# Patient Record
Sex: Male | Born: 1975 | Race: Black or African American | Hispanic: No | Marital: Married | Smoking: Never smoker
Health system: Southern US, Community
[De-identification: ages and names within clinical notes are randomized; demographics above are authoritative.]

## PROBLEM LIST (undated history)

## (undated) DIAGNOSIS — Z940 Kidney transplant status: Secondary | ICD-10-CM

## (undated) DIAGNOSIS — B351 Tinea unguium: Secondary | ICD-10-CM

## (undated) DIAGNOSIS — R000 Tachycardia, unspecified: Secondary | ICD-10-CM

## (undated) DIAGNOSIS — N186 End stage renal disease: Secondary | ICD-10-CM

## (undated) DIAGNOSIS — I509 Heart failure, unspecified: Secondary | ICD-10-CM

## (undated) DIAGNOSIS — I10 Essential (primary) hypertension: Secondary | ICD-10-CM

## (undated) DIAGNOSIS — E785 Hyperlipidemia, unspecified: Secondary | ICD-10-CM

## (undated) DIAGNOSIS — Z87448 Personal history of other diseases of urinary system: Secondary | ICD-10-CM

## (undated) DIAGNOSIS — N289 Disorder of kidney and ureter, unspecified: Secondary | ICD-10-CM

## (undated) HISTORY — PX: KIDNEY TRANSPLANT: SHX239

## (undated) HISTORY — DX: Tinea unguium: B35.1

## (undated) HISTORY — DX: Essential (primary) hypertension: I10

## (undated) HISTORY — DX: Hyperlipidemia, unspecified: E78.5

## (undated) HISTORY — DX: Personal history of other diseases of urinary system: Z87.448

## (undated) HISTORY — DX: Heart failure, unspecified: I50.9

## (undated) HISTORY — DX: Tachycardia, unspecified: R00.0

## (undated) HISTORY — DX: Kidney transplant status: Z94.0

## (undated) HISTORY — PX: ANKLE SURGERY: SHX546

---

## 1995-08-29 DIAGNOSIS — Z87448 Personal history of other diseases of urinary system: Secondary | ICD-10-CM

## 1995-08-29 HISTORY — PX: AV FISTULA PLACEMENT: SHX1204

## 1995-08-29 HISTORY — DX: Personal history of other diseases of urinary system: Z87.448

## 1997-11-26 ENCOUNTER — Other Ambulatory Visit: Admission: RE | Admit: 1997-11-26 | Discharge: 1997-11-26 | Payer: Self-pay | Admitting: Nephrology

## 1997-11-28 ENCOUNTER — Emergency Department (HOSPITAL_COMMUNITY): Admission: EM | Admit: 1997-11-28 | Discharge: 1997-11-28 | Payer: Self-pay | Admitting: Emergency Medicine

## 1997-12-03 ENCOUNTER — Other Ambulatory Visit: Admission: RE | Admit: 1997-12-03 | Discharge: 1997-12-03 | Payer: Self-pay | Admitting: *Deleted

## 1997-12-12 ENCOUNTER — Other Ambulatory Visit: Admission: RE | Admit: 1997-12-12 | Discharge: 1997-12-12 | Payer: Self-pay | Admitting: *Deleted

## 1997-12-17 ENCOUNTER — Other Ambulatory Visit: Admission: RE | Admit: 1997-12-17 | Discharge: 1997-12-17 | Payer: Self-pay | Admitting: Nephrology

## 1998-10-09 ENCOUNTER — Ambulatory Visit (HOSPITAL_COMMUNITY): Admission: RE | Admit: 1998-10-09 | Discharge: 1998-10-09 | Payer: Self-pay | Admitting: Nephrology

## 1998-10-09 ENCOUNTER — Encounter: Payer: Self-pay | Admitting: Nephrology

## 1999-08-30 ENCOUNTER — Inpatient Hospital Stay (HOSPITAL_COMMUNITY): Admission: EM | Admit: 1999-08-30 | Discharge: 1999-09-03 | Payer: Self-pay | Admitting: Emergency Medicine

## 1999-08-30 ENCOUNTER — Encounter: Payer: Self-pay | Admitting: Nephrology

## 1999-08-31 ENCOUNTER — Encounter: Payer: Self-pay | Admitting: Nephrology

## 1999-09-01 ENCOUNTER — Encounter: Payer: Self-pay | Admitting: Nephrology

## 1999-09-08 ENCOUNTER — Encounter: Admission: RE | Admit: 1999-09-08 | Discharge: 1999-09-08 | Payer: Self-pay | Admitting: *Deleted

## 2000-03-15 ENCOUNTER — Inpatient Hospital Stay (HOSPITAL_COMMUNITY): Admission: EM | Admit: 2000-03-15 | Discharge: 2000-03-21 | Payer: Self-pay | Admitting: Emergency Medicine

## 2000-03-16 ENCOUNTER — Encounter: Payer: Self-pay | Admitting: Nephrology

## 2000-04-17 ENCOUNTER — Inpatient Hospital Stay (HOSPITAL_COMMUNITY): Admission: EM | Admit: 2000-04-17 | Discharge: 2000-04-21 | Payer: Self-pay | Admitting: Emergency Medicine

## 2000-04-17 ENCOUNTER — Encounter: Payer: Self-pay | Admitting: Cardiology

## 2000-04-18 ENCOUNTER — Encounter: Payer: Self-pay | Admitting: Cardiology

## 2000-04-19 ENCOUNTER — Encounter: Payer: Self-pay | Admitting: Cardiology

## 2001-03-22 ENCOUNTER — Emergency Department (HOSPITAL_COMMUNITY): Admission: EM | Admit: 2001-03-22 | Discharge: 2001-03-22 | Payer: Self-pay | Admitting: Emergency Medicine

## 2001-03-26 ENCOUNTER — Encounter: Payer: Self-pay | Admitting: Emergency Medicine

## 2001-03-26 ENCOUNTER — Inpatient Hospital Stay (HOSPITAL_COMMUNITY): Admission: EM | Admit: 2001-03-26 | Discharge: 2001-03-29 | Payer: Self-pay | Admitting: Emergency Medicine

## 2001-03-27 ENCOUNTER — Encounter (HOSPITAL_COMMUNITY): Payer: Self-pay | Admitting: Dentistry

## 2002-10-30 ENCOUNTER — Emergency Department (HOSPITAL_COMMUNITY): Admission: EM | Admit: 2002-10-30 | Discharge: 2002-10-30 | Payer: Self-pay | Admitting: Emergency Medicine

## 2002-10-31 ENCOUNTER — Encounter: Payer: Self-pay | Admitting: Emergency Medicine

## 2003-07-01 ENCOUNTER — Ambulatory Visit (HOSPITAL_COMMUNITY): Admission: RE | Admit: 2003-07-01 | Discharge: 2003-07-01 | Payer: Self-pay | Admitting: Nephrology

## 2003-07-14 ENCOUNTER — Inpatient Hospital Stay (HOSPITAL_COMMUNITY): Admission: EM | Admit: 2003-07-14 | Discharge: 2003-07-22 | Payer: Self-pay | Admitting: Emergency Medicine

## 2003-07-15 ENCOUNTER — Encounter: Payer: Self-pay | Admitting: Cardiology

## 2003-08-07 ENCOUNTER — Ambulatory Visit (HOSPITAL_COMMUNITY): Admission: RE | Admit: 2003-08-07 | Discharge: 2003-08-07 | Payer: Self-pay | Admitting: Nephrology

## 2003-08-14 ENCOUNTER — Ambulatory Visit (HOSPITAL_COMMUNITY): Admission: RE | Admit: 2003-08-14 | Discharge: 2003-08-14 | Payer: Self-pay | Admitting: Vascular Surgery

## 2003-10-14 ENCOUNTER — Ambulatory Visit (HOSPITAL_COMMUNITY): Admission: RE | Admit: 2003-10-14 | Discharge: 2003-10-14 | Payer: Self-pay | Admitting: Nephrology

## 2004-08-28 HISTORY — PX: KIDNEY SURGERY: SHX687

## 2005-05-26 ENCOUNTER — Emergency Department (HOSPITAL_COMMUNITY): Admission: EM | Admit: 2005-05-26 | Discharge: 2005-05-26 | Payer: Self-pay | Admitting: Emergency Medicine

## 2006-04-06 ENCOUNTER — Emergency Department (HOSPITAL_COMMUNITY): Admission: EM | Admit: 2006-04-06 | Discharge: 2006-04-06 | Payer: Self-pay | Admitting: Emergency Medicine

## 2006-09-04 ENCOUNTER — Emergency Department (HOSPITAL_COMMUNITY): Admission: EM | Admit: 2006-09-04 | Discharge: 2006-09-05 | Payer: Self-pay | Admitting: Emergency Medicine

## 2007-02-07 ENCOUNTER — Emergency Department (HOSPITAL_COMMUNITY): Admission: EM | Admit: 2007-02-07 | Discharge: 2007-02-07 | Payer: Self-pay | Admitting: Emergency Medicine

## 2007-03-21 ENCOUNTER — Ambulatory Visit (HOSPITAL_COMMUNITY): Admission: RE | Admit: 2007-03-21 | Discharge: 2007-03-21 | Payer: Self-pay | Admitting: Nephrology

## 2010-02-13 ENCOUNTER — Emergency Department (HOSPITAL_COMMUNITY): Admission: EM | Admit: 2010-02-13 | Discharge: 2010-02-13 | Payer: Self-pay | Admitting: Emergency Medicine

## 2010-09-17 ENCOUNTER — Encounter: Payer: Self-pay | Admitting: Nephrology

## 2010-10-20 ENCOUNTER — Encounter (INDEPENDENT_AMBULATORY_CARE_PROVIDER_SITE_OTHER): Payer: Self-pay | Admitting: *Deleted

## 2010-10-20 LAB — CONVERTED CEMR LAB
ALT: 19 units/L (ref 0–53)
AST: 17 units/L (ref 0–37)
Albumin: 4.3 g/dL (ref 3.5–5.2)
Alkaline Phosphatase: 76 units/L (ref 39–117)
BUN: 22 mg/dL (ref 6–23)
Basophils Absolute: 0 10*3/uL (ref 0.0–0.1)
Basophils Relative: 0 % (ref 0–1)
CO2: 24 meq/L (ref 19–32)
Calcium, Total (PTH): 9.5 mg/dL (ref 8.4–10.5)
Calcium: 9.5 mg/dL (ref 8.4–10.5)
Chloride: 103 meq/L (ref 96–112)
Cholesterol: 214 mg/dL — ABNORMAL HIGH (ref 0–200)
Creatinine, Ser: 1.65 mg/dL — ABNORMAL HIGH (ref 0.40–1.50)
Eosinophils Absolute: 0.1 10*3/uL (ref 0.0–0.7)
Eosinophils Relative: 2 % (ref 0–5)
Glucose, Bld: 284 mg/dL — ABNORMAL HIGH (ref 70–99)
HCT: 46.9 % (ref 39.0–52.0)
HDL: 37 mg/dL — ABNORMAL LOW (ref >39)
Hemoglobin: 15.8 g/dL (ref 13.0–17.0)
Hgb A1c MFr Bld: 10.9 % — ABNORMAL HIGH (ref <5.7)
LDL Cholesterol: 121 mg/dL — ABNORMAL HIGH (ref 0–99)
Lymphocytes Relative: 21 % (ref 12–46)
Lymphs Abs: 1.3 10*3/uL (ref 0.7–4.0)
MCHC: 33.7 g/dL (ref 30.0–36.0)
MCV: 74.7 fL — ABNORMAL LOW (ref 78.0–100.0)
Monocytes Absolute: 0.7 10*3/uL (ref 0.1–1.0)
Monocytes Relative: 11 % (ref 3–12)
Neutro Abs: 4.2 10*3/uL (ref 1.7–7.7)
Neutrophils Relative %: 67 % (ref 43–77)
PTH: 111.1 pg/mL — ABNORMAL HIGH (ref 14.0–72.0)
Phosphorus: 3.1 mg/dL (ref 2.3–4.6)
Platelets: 136 10*3/uL — ABNORMAL LOW (ref 150–400)
Potassium: 4.1 meq/L (ref 3.5–5.3)
RBC: 6.28 M/uL — ABNORMAL HIGH (ref 4.22–5.81)
RDW: 14.4 % (ref 11.5–15.5)
Sodium: 139 meq/L (ref 135–145)
Total Bilirubin: 0.5 mg/dL (ref 0.3–1.2)
Total CHOL/HDL Ratio: 5.8
Total Protein: 6.7 g/dL (ref 6.0–8.3)
Triglycerides: 279 mg/dL — ABNORMAL HIGH (ref <150)
VLDL: 56 mg/dL — ABNORMAL HIGH (ref 0–40)
WBC: 6.2 10*3/uL (ref 4.0–10.5)

## 2010-11-14 LAB — URINALYSIS, ROUTINE W REFLEX MICROSCOPIC
Bilirubin Urine: NEGATIVE
Glucose, UA: >1000 mg/dL — AB
Ketones, ur: NEGATIVE mg/dL
Leukocytes, UA: NEGATIVE
Nitrite: NEGATIVE
Protein, ur: NEGATIVE mg/dL
Specific Gravity, Urine: 1.026 (ref 1.005–1.030)
Urobilinogen, UA: 0.2 mg/dL (ref 0.0–1.0)
pH: 5.5 (ref 5.0–8.0)

## 2010-11-14 LAB — DIFFERENTIAL
Basophils Absolute: 0 10*3/uL (ref 0.0–0.1)
Basophils Relative: 1 % (ref 0–1)
Eosinophils Absolute: 0.1 10*3/uL (ref 0.0–0.7)
Eosinophils Relative: 3 % (ref 0–5)
Lymphocytes Relative: 14 % (ref 12–46)
Lymphs Abs: 0.6 10*3/uL — ABNORMAL LOW (ref 0.7–4.0)
Monocytes Absolute: 0.4 10*3/uL (ref 0.1–1.0)
Monocytes Relative: 9 % (ref 3–12)
Neutro Abs: 3.3 10*3/uL (ref 1.7–7.7)
Neutrophils Relative %: 74 % (ref 43–77)

## 2010-11-14 LAB — CBC
HCT: 45.2 % (ref 39.0–52.0)
Hemoglobin: 15.4 g/dL (ref 13.0–17.0)
MCHC: 34 g/dL (ref 30.0–36.0)
MCV: 75.3 fL — ABNORMAL LOW (ref 78.0–100.0)
Platelets: 114 10*3/uL — ABNORMAL LOW (ref 150–400)
RBC: 6 MIL/uL — ABNORMAL HIGH (ref 4.22–5.81)
RDW: 13.7 % (ref 11.5–15.5)
WBC: 4.4 10*3/uL (ref 4.0–10.5)

## 2010-11-14 LAB — POCT I-STAT 3, VENOUS BLOOD GAS (G3P V)
Acid-base deficit: 1 mmol/L (ref 0.0–2.0)
Bicarbonate: 24 mEq/L (ref 20.0–24.0)
O2 Saturation: 91 %
TCO2: 25 mmol/L (ref 0–100)
pCO2, Ven: 41.5 mmHg — ABNORMAL LOW (ref 45.0–50.0)
pH, Ven: 7.37 — ABNORMAL HIGH (ref 7.250–7.300)
pO2, Ven: 63 mmHg — ABNORMAL HIGH (ref 30.0–45.0)

## 2010-11-14 LAB — GLUCOSE, CAPILLARY
Glucose-Capillary: 196 mg/dL — ABNORMAL HIGH (ref 70–99)
Glucose-Capillary: 270 mg/dL — ABNORMAL HIGH (ref 70–99)
Glucose-Capillary: 319 mg/dL — ABNORMAL HIGH (ref 70–99)
Glucose-Capillary: 369 mg/dL — ABNORMAL HIGH (ref 70–99)
Glucose-Capillary: 439 mg/dL — ABNORMAL HIGH (ref 70–99)
Glucose-Capillary: 78 mg/dL (ref 70–99)

## 2010-11-14 LAB — BASIC METABOLIC PANEL
BUN: 20 mg/dL (ref 6–23)
CO2: 23 mEq/L (ref 19–32)
Calcium: 9.1 mg/dL (ref 8.4–10.5)
Chloride: 101 mEq/L (ref 96–112)
Creatinine, Ser: 1.68 mg/dL — ABNORMAL HIGH (ref 0.4–1.5)
GFR calc Af Amer: 57 mL/min — ABNORMAL LOW (ref >60)
GFR calc non Af Amer: 47 mL/min — ABNORMAL LOW (ref >60)
Glucose, Bld: 460 mg/dL — ABNORMAL HIGH (ref 70–99)
Potassium: 4.1 mEq/L (ref 3.5–5.1)
Sodium: 129 mEq/L — ABNORMAL LOW (ref 135–145)

## 2010-11-14 LAB — URINE MICROSCOPIC-ADD ON

## 2010-11-14 LAB — KETONES, QUALITATIVE: Acetone, Bld: NEGATIVE

## 2011-01-13 NOTE — H&P (Signed)
[place]. [facility]  Patient:    Adam Davis, Adam Davis                      MRN: [id number] Proc. Date: 03/15/00 Adm. Date:  [id number] Attending:  Hans Eden Dictator:   Valetta Fuller. Verdis Frederickson, M.D.                         History and Physical  DATE OF BIRTH:  06/05/1976  ADMISSION DIAGNOSES:  1. Abdominal pain and hematemesis x 4 since last night, approximately one cup     of blood and fluid.  2. 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  3. Severe hypertension.  The patient has had nephrotic syndrome since age 15,     with a history of strep infection as a child, on dialysis since Jan 14, 1986, Tuesday, Thursday, Saturday at [facility] thought right     arteriovenous fistula.  4. Anemia.  Baseline hemoglobin 8.3, now 7.9.  5. Hypertension, resistant to multiple medical regimen.  6. History of pneumonia/sepsis in January of 2001.  7. History of fluid overload secondary to noncompliance.  8. History of marked cardiomegaly secondary to hypertension and fluid     overload.  Echocardiogram from January 2001 indicated mild left     ventricular hypertrophy, normal ejection fraction, small pericardial     effusion, trace mitral regurgitation, trace Tricuspid regurgitation, and     trace pulmonic regurgitation.  9. History of hyperparathyroidism. 10. History of fractured left ankle. 11. History of NSAID allergy (back pain, nausea and vomiting).  CHIEF COMPLAINT:  Abdominal pain with vomiting.  HISTORY OF PRESENT ILLNESS:  The patient relates that he began feeling poorly about five days ago with weakness and abdominal pain.  He says the pain sometimes gets better with food and sometimes gets worse, and he cannot relate it to anything.  The pain does not radiate.  Just last night he says he had vomiting of fluid that looked like blood, approximately one cup.  He vomited four times.  He denies any chest pain, shortness of breath,  headaches, blurred vision, dysuria, constipation, diarrhea, black, or bloody stool.  Currently the patient has mild epigastric pain, but is asking for ice chips.  PAST MEDICAL HISTORY:  Please see above problems.  CURRENT MEDICATIONS: 1. Calcijex 1 mcg p.o. every dialysis and Epogen 10,500 units p.o. every    treatment. 2. Norvasc 10 mg one p.o. b.i.d. 3. Catapres-TTS patch. 4. Os-Cal two b.i.d. with meals. 5. Lopressor 100 mg p.o. b.i.d. 6. Monopril 40 mg one p.o. b.i.d. 7. Clonidine 0.3 mg p.o. b.i.d. 8. Nephro-Vite one p.o. q.d.  ALLERGIES:  Include NSAID.  The patient gets back pain, nausea and vomiting.  SOCIAL HISTORY:  The patient lives at home with his wife.  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  He has a 82-year-old child.  FAMILY HISTORY:  Negative for hypertension or kidney disease.  REVIEW OF SYSTEMS:  Please see history of present illness.  The only other thing is the patient mentions he had a mild nose bleed approximately two weeks prior when he blew some bloody secretions into a tissue, it resolved rapidly. Otherwise please see history of present illness.  Dialysis prescription 3 hours:  Calcijex 1 mcg ______, Epogen 10,500 mg ______, estimated dry weight is 60.5, 2+ K bath.  Uses Optiflux dialyzer. Note that the patient only  took 2-3/4 hours of dialysis on July 17 as well as July 14.  PHYSICAL EXAMINATION:  VITAL SIGNS:  He has a blood pressure of 206/122, pulse 82, respirations 16, temperature 99.6.  GENERAL:  The patient is a well-developed, well-nourished black male in no acute distress.  HEENT:  Examination shows normocephalic, atraumatic.  His pupils are anicteric.  His extraocular muscles are intact.  His eyelids are without lesion.  His oropharynx and nasopharynx is without lesion.  There is no thrush.  His nasopharynx is without secretions or blood.  Oropharynx shows no blood as well.  His ears are nontender.  NECK:  Supple.  No  lymphadenopathy and no bruits.  CARDIAC:  Examination shows a regular rate and rhythm with no murmurs.  No rubs.  RESPIRATORY:  Examination is clear to auscultation bilaterally with normal respiratory motions.  ABDOMEN:  Examination shows he has positive bowel sounds.  He is mildly tender in the epigastric and right upper quadrant area.  There is no rebound or no guarding.  EXTREMITIES:  Show no clubbing, cyanosis, or edema and he has good pulses bilaterally.  NEUROLOGICAL:  Examination shows no sensory deficit.  His cranial nerves II-XII are intact and deep tendon reflexes are 2+ and symmetric.  MUSCULOSKELETAL:  Examination shows 5/5 strength throughout.  He has a graft present on the right wrist.  LABORATORY DATA:  Shows a white cell count of 9.9, hematocrit 22.8 with a platelet count of 183.  His MCV is 72.6.  Granulocyte count of 7.6 and PT of 14.8 with an INR of 14.3, PTT of 33 which is normal.  Sodium 143, potassium 4.3, chloride 102, carbon dioxide 31, glucose of 108, BUN 59, creatinine 17.7 with a calcium of 8.6.  LFT showed total protein 5.6, albumin 2.9, AST of 20, ALT of 9, alk. phos. at 54.  Total bilirubin 0.8, direct bilirubin 0, indirect bilirubin 0.8.  Amylase and lipase are pending at the time of this dictation.  ASSESSMENT:  We have a 35 year old black male with end-stage renal disease secondary to hypertension, who is here with abdominal pain and a reported hematemesis.  The patient has no history of peptic ulcer disease or esophageal varices. 1. Upper gastrointestinal bleed.  This is likely secondary to Mallory-Weiss    tear, or gastritis.  We will admit for observation, begin Protonix and will    consult gastrointestinal if hematemesis returns.  Will use antiemetics    as needed. 2. Anemia likely secondary to #1.  Other possibilities given the low MCV    include iron deficiency anemia.  We will check iron studies this admission.    I known the patient is  allergic to NSAIDs, may need to use other form of     intravenous iron or ______.  Will hold two units of packed red blood cells    just in case bleeding returns. 3. End-stage renal disease.  The patient needs dialysis today.  We will review    his dialysis prescription and get him dialyzed here on 5500.   Also, I note    the patient has been coming off of dialysis 30 minutes to prescribed time.    Also of note is that his electrolytes look okay today.  Normal dialysis. 4. Hypertension.  We will resume the patients home blood pressure medicines.    Hopefully this will improve after dialysis in getting some fluid off.  May    need to adjust patients blood pressure medicines while he is here.  The above was discussed with Dr. Lowell Guitar. DD:  03/15/00 TD:  03/15/00 Job: [id number]

## 2011-01-13 NOTE — Discharge Summary (Signed)
NAMECARLISLE, Adam Davis                         ACCOUNT NO.:  [id number]   MEDICAL RECORD NO.:  [id number]                   PATIENT TYPE:  INP   LOCATION:  5506                                 FACILITY:  [facility]   PHYSICIAN:  [name], M.D.            DATE OF BIRTH:  [birth year]   DATE OF ADMISSION:  DATE OF DISCHARGE:  07/22/2003                                 DISCHARGE SUMMARY   DISCHARGE DIAGNOSES:  1. Acute pulmonary edema secondary to congestive heart failure.  2. Streptococcus viridans bacteremia (one of two positive cultures).  3. End-stage renal disease on hemodialysis on Tuesdays, Thursdays and     Saturdays at [facility].  4. Anemia secondary to end-stage renal disease.  5. Secondary hyperparathyroidism secondary to end-stage renal disease.  6. Uncontrolled hypertension.  7. Cardiomegaly with systolic heart failure with an EF of 25 to 35% by     echocardiogram done this admission.   DISCHARGE MEDICATIONS:  1. Norvasc 10 mg one p.o. q.h.s.  2. Os-Cal 500 mg three tabs with each meal.  3. Avapro 150 mg one p.o. b.i.d.  4. Toprol XL 100 mg one p.o. b.i.d.  5. Lisinopril 40 mg one p.o. b.i.d. (replaces Monopril).  6. Clonidine 0.3 mg one p.o. b.i.d.  7. Minoxidil 10 mg one p.o. b.i.d.  8. Avelox 400 mg one p.o. daily for a total course of 14 day treatment.  9. Nephro-Vite one p.o. daily.  10.      Prilosec 40 mg one p.o. daily.   HEMODIALYSIS ORDERS:  Patient is to continue all previous orders except for  a new estimated dry weight of 56.5 kilos.  He will also receive EPO 21,000  units IV with each dialysis, Zemplar 5 mcg IV with each dialysis, and  Venofer 100 mg IV every Thursday with dialysis.   DIET:  Should be a renal diet with a restriction of 1,200 cc of fluid.   SPECIAL INSTRUCTIONS AND FOLLOW UP INCLUDE:  An appointment with [facility]  Cardiology with Dr. Andee Lineman on August 05, 2003, at 12 p.m.  He also has an  appointment Wednesday, September 02, 2003 at  10:30 a.m. for a repeat echo at  [facility] (repeat echo in six weeks is to follow patient's  pericardial effusion since his minoxidil has been restarted.   HOSPITAL COURSE:  Adam Davis is a 35 year old African-American male with a  history of end-stage renal disease on dialysis and a history of cardiomegaly  with CHF who presented in acute pulmonary edema and was initially intubated.  Patient was extubated the following day after a massive volume removal.  We  removed 9 liters in one day.  Patient tolerated this fairly well but then  also began to spike fevers with dialysis.  The following problems were  addressed during his admission:  1. Pulmonary edema secondary to CHF.  It appears from patient's history that     he may have  had some chronic volume overload.  He frequently skips     dialysis or ends his dialysis sessions early because of cramping.  He has     recently also increased his estimated dry weight.  It is still unclear     exactly what tipped the patient over.  Infection is a possibility.     Nonetheless, patient was diuresed very aggressively and by discharge we     were able to get him down to a dry weight of 56.5 kilos and patient's     lungs are very clear.  We did have a follow-up chest x-ray after     aggressive diuresis which did not show any worrisome infiltrates.  2. Fever and leukocytosis.  Patient initially did have Tmax to 101.4 with a     moderate leukocytosis of 12,900.  Blood cultures were obtained and he did     grow Strep viridans in one out of two cultures.  It was sensitive to     Avelox which we switched him to and discharged him on for a complete     course of 14 days treatment.  Patient does have chronic areas of what     look like venous stasis around his heels bilaterally, though there are     several dry areas that the patient does seem to pick at and does have     some scab formation.  This is a possible entry source for infection.  He      does also report having a cracked tooth that is starting to grow into his     gums that he needs to have addressed.  This may also be a source for his     Strep viridans as well.  3. Anemia.  Patient is on EPO 21,000 units with dialysis.  We continued     this.  He was on Ferralet as an outpatient but we did put him on Venofer     100 mg here weekly.  4. History of hyperparathyroidism.  We continued patient's Zemplar and     continued his calcium carbonate.  5. Hypertension.  This is a major issue for the patient as he has not been a     well-controlled hypertensive for several years at this point and often     shows up to dialysis with systolics around 200.  Patient had evidently     been tried on minoxidil back in 2001 but ended up having a pericardial     effusion secondary to minoxidil.  At this particular time, just to gain     better control, we have decided to restart the minoxidil at 10 mg twice a     day and have a repeat echo performed in six weeks to ensure that his     current small pericardial effusion is not worsening.  He will need to be     monitored very closely for any symptoms on this medication.  With also     lowering patient's dry weight, we have gained much better control over     his blood pressures as well.  This may have been a limiting factor for     the past year or two.  We will need to continue to encourage patient to     do as best he can to try to dialyze to this dry weight consistently as     patient is often very hesitant because of cramping.  If patient responds  well to minoxidil, we can potentially discontinue some of his other blood     pressure medications.  We did, in fact, decrease his Norvasc and we are     also holding his Cardura currently.  These may need to be restarted in     the future if he is unable to continue with the minoxidil or if his blood     pressures are still not at goal. 6. CHF.  An echocardiogram was done here on July 14, 2003, which showed     some left ventricular systolic function that was moderately to morbidly     decreased.  He had an EF of 25 to 35% with severe left ventricular     hypokinesis and left wall thickening had also increased.  There was also     evidence of amyloid deposition and a small pericardial effusion.  There     were no vegetations noted.  Patient has a follow-up appointment at     [facility] with Dr. Andee Lineman as well as follow-up for a repeat     ultrasound in six weeks.                                                [name], M.D.    CM/MEDQ  D:  07/22/2003  T:  07/22/2003  Job:  [id number]

## 2011-01-13 NOTE — H&P (Signed)
[place]. [facility]  Patient:    Adam Davis                       MRN: [id number] Adm. Date:  [id number] Attending:  Dayle Points CC:         [facility]                         History and Physical  REASON FOR ADMISSION:  Rule out sepsis.  HISTORY OF PRESENT ILLNESS:  This is a 35 year old black male with end-stage renal disease seen on hemodialysis today by Dr. August Luz with history of a cough and nasal discharge with fevers of two days duration.  Temperature at dialysis was initially 100.3.  Patient was treated with Ancef 2 g IV after blood cultures and was to have started an outpatient course of Levaquin orally.  He subsequently spiked to 102.8 with rigors, the first time this had happened over the past 48 hours, received Tobramycin 110 mg IV and was sent to the emergency room for evaluation.  His temperature at the time of arrival was 104.9.  Patient states he was so weak when he initially arrived that he was unable to get out of his car due to the fever. He denies chest pain, shortness of breath, abdominal pain, or dysuria, but has had a fairly substantial cough with some sputum production over the past couple of days. He is therefore admitted for continuation of empiric antibiotic therapy and monitoring.  PAST MEDICAL HISTORY: 1. 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 dialysis, Ferrlecit 125 mg a week, and Calcijex 0.5 mcg q.h.d.) 2. Severe hypertension on a multidrug regimen. 3. Medical noncompliance with dialysis and diet. 4. Secondary hyperparathyroidism. 5. History of a left ankle fracture. 6. History of _________  sepsis in 1997. 7. Allergy to Ansaid.  CURRENT MEDICATIONS: 1. Monopril 40 mg b.i.d. 2. Minoxidil 5 mg b.i.d. 3. Lopressor  100 mg b.i.d. 4. Norvasc 10 mg a day. 5. Os-Cal 500 two t.i.d. with meals. 6. Nephro-Vite once a day.  ALLERGIES:  He has the allergy to Ansaid as previously mentioned.  FAMILY/SOCIAL HISTORY:  Noncontributory.  REVIEW OF SYSTEMS:  Positive for the clear nasal discharge, cough, sputum production, and some discomfort in his abdominal musculature related to coughing. He has problems with some right-sided gingival hyperplasia and positive myalgias.  PHYSICAL EXAMINATION:  GENERAL:  On exam he is ill-appearing, but not acutely so.  VITAL SIGNS:  Temperature 102.4, down from 104.9 after Tylenol.  O2 saturations 97% on room air, blood pressure 173/94.  LUNGS:  There were dry crackles at the left base and reduced breath sounds at the right base.  There are no wheezes or other adventitious sounds.  NECK:  Supple, no JVD, no adenopathy.  HEENT:  Oropharynx was nonerythematous.  There were no plaques on the tongue. e did have some gingival hyperplasia around the right lower molars.  HEART:  Precordium was dynamic.  PMI laterally and inferiorly displaced with a summation gallop, S4, S1, S2, S3.  He had a 2/6 murmur at the upper sternal border heard out to the apex.  ABDOMEN:  Abdomen showed numerous striae.  Bowel sounds  were present.  Abdomen as nontender to palpation.  EXTREMITIES:  He had a right AV fistula which is patent without redness or swelling and no lower extremity edema.  No other skin lesions were noted.  LABORATORY:  WBC 7100, hemoglobin 10.7, hematocrit 30.3, platelets 111,000, 75 polys, and 9 lymphs on peripheral smear.  Chest x-ray showed fairly dramatic cardiomegaly, albeit stable since February of 2000 with a question of bilateral perihilar infiltrates versus edema with some fluid in the fissures on the right.  No definite effusions were seen.  IMPRESSION:  Fairly impressive fevers with associated rigors and questionable changes on chest x-ray - rule out  sepsis, possibly of a pulmonary source.  This may all be viral but with the questionable changes on chest x-ray and degree of temperature elevation, I feel he should be admitted for observation and covered for typical and atypical pulmonary pathogens.  He has very massive cardiomegaly, probably secondary to his long-standing hypertension and the notoriously poor control.  He does not recall ever having ad a 2-D echo done to evaluate this, and in fact was not aware of any prior history of cardiac enlargement.  PLAN:  Admit for observation and continued antibiotic therapy.  Follow-up blood  cultures done at the [facility] and obtain repeat blood cultures here.  Stop Ancef and Tobramycin and treat empirically with Azithromycin and Ceftin for typical and atypical pathogens.  Will check a urine culture to be complete.  Follow-up PA and lateral chest x-ray in the a.m. to reevaluate the question of infiltrate versus edema.  He does probably need lowering of his dry weight. Tylenol p.r.n. for temps.  Repeat labs in the a.m.  As a separate issue, should  have either during this admission or at some point a 2-D echo to evaluate his fairly impressive cardiomegaly.  Continue usual Tuesday, Thursday, Saturday dialysis. DD:  08/30/99 TD:  08/30/99 Job: [id number]

## 2011-01-13 NOTE — Discharge Summary (Signed)
[place]. [facility]  Patient:    DEWIE, AHART                      MRN: [id number] Adm. Date:  [id number] Disc. Date: [id number] Attending:  Hans Eden Dictator:   Tawni Millers                           Discharge Summary  ADDENDUM TO DISCHARGE SUMMARY [phone]:  Mr. Karl Ito was scheduled for discharge home after dialysis on March 20, 2000. However, in dialysis, Mr. Karl Ito had severe cramping and was given Ativan in addition to the Flexeril he received before dialysis.  By the end of dialysis, Mr. Karl Ito felt too tired to leave.  In addition, his blood pressure remained elevated with a systolic over 200 and diastolic over 110.  He was, therefore, kept overnight to monitor blood pressure and allow him to rest.  On the morning of March 21, 2000, his pressure was down to 160s/100s.  He felt better.  Dr. Caryn Section saw the patient with me, and it was decided to begin the patient on Avapro 150 mg 1 p.o. q.d.  In addition, the patient will be sent home with the ______  Prevpac which will be Protonix 40 mg p.o. b.i.d., amoxicillin 1000 mg p.o. b.i.d., and Biaxin 500 mg p.o. b.i.d., all x 14 days. The patient is to follow up in dialysis on Thursday, March 22, 2000.  He is to come to the office, as well, to pick up his samples of Avapro.  Also should follow up on blood pressure. DD:  03/21/00 TD:  03/23/00 Job: [id number]

## 2011-01-13 NOTE — Op Note (Signed)
[name], Adam                         ACCOUNT NO.:  [id number]   MEDICAL RECORD NO.:  [id number]                   PATIENT TYPE:  OIB   LOCATION:  2854                                 FACILITY:  [facility]   PHYSICIAN:  Quita Skye. Hart Rochester, M.D.               DATE OF BIRTH:  [birth year]   DATE OF PROCEDURE:  08/14/2003  DATE OF DISCHARGE:  08/14/2003                                 OPERATIVE REPORT   PREOPERATIVE DIAGNOSIS:  Pseudoaneurysm of the left brachial artery.   POSTOPERATIVE DIAGNOSIS:  Pseudoaneurysm of the left brachial artery.   PROCEDURE:  Suture repair of the left brachial artery pseudoaneurysm.   SURGEON:  Quita Skye. Hart Rochester, M.D.   FIRST ASSISTANT:  Claudette TNils Flack, N.P.   ANESTHESIA:  Local.   DESCRIPTION OF PROCEDURE:  The patient was taken to the operating room and  placed in the supine position. At this time the left upper extremity was  prepped with Betadine scrub and solution and draped in a routine sterile  manner. There was a partially thrombosed pseudoaneurysm of the brachial  artery at the antecubital area.   After infiltration with 1% Xylocaine, a short, 2-cm incision was made in the  antecubital area and carried down to the pseudoaneurysm which had a fairly  well developed wall. It was partially pulsatile. It was opened  longitudinally and it was mostly thrombosed but did have significant  arterial flow emanating from the base.   Therefore hemostasis was achieved with the tourniquet, and when this had  been inflated to 250 mmHg (5000 units of heparin had already been  administered), the hole in the brachial artery was identified. It was a  punctate puncture on the anterior wall of the artery and the space  surrounding  this filled with thrombus was approximately 4 cm in diameter.  The thrombus was then evacuated from the space and the hole in the artery  was repaired with 6-0 Prolene suture.   Following this the tourniquet was released and there  was no bleeding coming  from the space. After evacuation of the hematoma, a small Penrose drain was  brought out through a superiorly based stab wound and secured with a nylon  suture and the wound was closed in layers with Vicryl in a subcuticular  fashion with Steri-Strips. A sterile dressing was applied. The patient was  taken to the recovery room in satisfactory condition.                                               Quita Skye Hart Rochester, M.D.    JDL/MEDQ  D:  08/14/2003  T:  08/16/2003  Job:  [id number]

## 2011-01-13 NOTE — Discharge Summary (Signed)
[place]. [facility]  Patient:    Adam Davis                       MRN: [id number] Adm. Date:  [id number] Disc. Date: [id number] Attending:  Dayle Points Dictator:   Amada Jupiter, P.A. CC:         [facility]             Rollene Rotunda, M.D. [facility]                           Discharge Summary  ADMISSION DIAGNOSES: 1. Fever with rigors, rule out sepsis. 2. Perihilar infiltrates on chest x-ray. 3. Massive cardiomegaly, new since February 2000. 4. End-stage renal disease, on chronic hemodialysis. 5. Hypertension. 6. Secondary hyperparathyroidism.  DISCHARGE DIAGNOSES: 1. Fever and rigors, resolved on antibiotics, cultures negative. 2. Probable bronchopneumonia, improved on antibiotics. 3. Massive cardiomegaly felt secondary to volume overload, improved with fluid    removal on dialysis. 4. Pulmonary edema, improved with fluid removal on dialysis. 5. End-stage renal disease, on chronic hemodialysis. 6. Secondary hyperparathyroidism. 7. Hypertension.  HISTORY OF PRESENT ILLNESS:  [age]-year-old black male with end-stage renal disease, on chronic hemodialysis every Tuesday, Thursday, Saturday at the [facility], has had a two-day duration of fevers and, on the day f admission, presented to dialysis with a temperature of 100.3 and spiked to a temperature of 102.8.  Blood cultures were obtained, and he was treated with Ancef 2 g IV and tobramycin 110 mg IV.  He was instructed to come to the emergency room for evaluation.  Upon arrival, his temperature was 104.9, and he felt extremely  weak.  He denied chest pain, shortness of breath, abdominal pain, dysuria, but ad a fairly substantial cough and sputum production of which phlegm.  He is admitted for workup and empiric antibiotic therapy.  ADMISSION LABORATORY DATA:  White count 7100, with 75 segs, 9 lymphocytes, 2 eosinophils, 3 basophils, hematocrit 30.3,  hematocrit 10.7, platelets 111,000.  HOSPITAL COURSE:  Chest x-ray on admission showed marked generalized cardiomegaly, accentuated perihilar and bronchovascular markings, slight peribronchial and perihilar infiltrative changes, and no consolidative infiltrates.  Blood cultures and urine cultures were obtained, and he was empirically placed on oral Ceftin nd IV Zithromax.  Within 24 hours, his temperature defervesced, and the patient felt much better.  His moderate-sized pericardial effusion was evaluated by [facility].  Echocardiogram showed the left ventricle to be normal in size, but  moderate concentric left ventricular hypertrophy present, with the left ventricular ejection fraction to be normal.  The left atrium is moderately dilated.  The right ventricle is normal size with normal wall thickness and normal structure and function.  The right atrium was normal.  There was a moderate-sized pericardial  effusion.  Aortic valve and mitral valve were both normal in structure and function, and both with mild regurgitation.  Tricuspid valve also was normal in  structure and function, with trace regurgitation, and the right ventricular systolic pressure was elevated, consistent with mild pulmonary hypertension. His dry weight at the kidney center is carried as 65.5.  He underwent two dialysis treatments this hospitalization, and was dialyzed down to a low of 59.3 kg with no drop in blood pressure and the lowest recorded blood pressure of 158/97.  He did, however, cramp severely, for which saline concentrate was given.  His symptoms improved dramatically, with coughing being much less.  Repeat chest x-ray on September 01, 1999, showed marked cardiomegaly persisting, with a small left effusion, but the interstitial edema seen on prior studies had improved.  There was residual edema at the bases.  Infectious disease was also consulted, and Dr. Burnice Logan felt that this was  probably a combination of uremia and hypertension.  Options ere of a conservative approach:  To treat with antibiotics, stop his minoxidil, lower his dry weight, and have a repeat echocardiogram as an outpatient versus an aggressive approach of pericardial window with tissue biopsy.  Since his fevers had resolved on antibiotics and, clinically, the patient had dramatically improved o baseline, the conservative approach was taken.  He was discharged to complete another three days of Zithromax and Ceftin.  His new dry weight will be carried at 60 kg.  He will follow up with [facility] on Friday, September 09, 1999, or a repeat echocardiogram.  He was instructed to stop his minoxidil.  DISCHARGE MEDICATIONS:  1. Zithromax 250 mg daily for three more days.  2. Ceftin 250 mg b.i.d. for three more days.  3. Norvasc 1 mg q.p.m.  4. Monopril 40 mg b.i.d.  5. Lopressor 100 mg b.i.d.  6. Calcium 500 mg 2 with each meal.  7. Nephro-Vite daily.  8. Calcijex 0.5 mcg IV in dialysis.  9. EPO 5000 units IV in dialysis. 10. Ferrlecit 125 mg IV weekly in dialysis.  DIALYSIS INSTRUCTIONS:  New dry weight of 60 kg.  Dialysis Tuesday, Thursday, Saturday at the [facility].  DISCHARGE INSTRUCTIONS:  Repeat echocardiogram September 09, 1999, [facility].  Repeat chest x-ray next week after dialysis. DD:  09/29/99 TD:  09/30/99 Job: [id number]

## 2011-01-13 NOTE — Discharge Summary (Signed)
[place]. [facility]  Patient:    Adam Davis, Adam Davis                      MRN: [id number] Adm. Date:  [id number] Disc. Date: [id number] Attending:  Learta Codding Dictator:   Abelino Derrick, P.A.C. [facility] CC:         Aram Beecham B. Eliott Nine, M.D. at [facility]   Referring Physician Discharge [place]  DISCHARGE DIAGNOSES: 1. Chest pain and shortness of breath, no evidence of myocardial infarction or    pericarditis. 2. Hypertension, better controlled at discharge. 3. End-stage renal disease on hemodialysis.  HOSPITAL COURSE:  Patient is a 35 year old male with known coronary disease who is a dialysis patient.  He also has malignant hypertension.  He has ahistory of pericarditis two months ago, secondary to minoxidil.  He was sent over from the dialysis center April 17, 2000 with chest pain and shortness of breath.  He was seen on admission by Dr. Andee Lineman.  Chest x-ray showed bilateral pleural effusions and CHF.  EKG showed LVH with strain.  Patient was admitted to telemetry.  CK-MBs and troponins were obtained.  His blood pressure was noted to be in poor control, with a blood pressure on admission of 217/133. His medications were adjusted and he was seen in consult by the renal service. He did have a positive d-dimer on admission.  Lower extremities Dopplers were obtained, which showed no evidence of a DVT.  Echocardiogram done showed mildly dilated LV with moderate LV dysfunction with an EF of 35-40%, with moderate to marked increased left ventricular wall thickness.  There was also moderate MR and elevated pulmonary pressures.  Patient was felt to be stable for discharge April 21, 2000.  He will follow up with the renal service and Dr. Andee Lineman.  DISCHARGE MEDICATIONS: 1. Clonidine 0.3 b.i.d. 2. Catapres 0.3 patch once a week. 3. Toprol XL 200 mg b.i.d. 4. Norvasc 10 mg b.i.d. 5. Avapro once a day. 6. Prinivil 40 mg b.i.d. 7. Protonix 40 mg a  day.  LABORATORY DATA:  EKG was normal sinus rhythm with LVH and ST changes.  CT of the brain done August 22 was normal.  Chest x-ray showed asymmetric edema.  Follow-up chest x-ray showed improved CHF.  White count 7.3, hemoglobin 8.7, hematocrit 25.6, platelet count 164.  INR 1.2.  Sodium 139, potassium 3.9, BUN 41, creatinine 13.  Liver functions were normal.  CKs were negative.  His troponin was slightly positive at 0.11 and 0.09.  DISPOSITION:  Patient is discharged in stable condition, to follow up with Dr. Andee Lineman and with the renal service. DD:  05/24/00 TD:  05/24/00 Job: [id number]

## 2011-01-13 NOTE — Discharge Summary (Signed)
[place]. [facility]  Patient:    Adam Davis, Adam Davis Visit Number: [id number] MRN: [id number]          Service Type: Attending:  Garnetta Buddy, M.D. Dictated by:   Amada Jupiter, P.A. Adm. Date:  03/26/01 Disc. Date: 03/29/01   CC:         [facility]  Cindra Eves, D.D.S.   Discharge Summary  ADMISSION DIAGNOSES: 1. Pulmonary edema with bilateral small pleural effusions. 2. Hypertension, uncontrolled. 3. End-stage renal disease on chronic hemodialysis.  DISCHARGE DIAGNOSES: 1. Pulmonary edema, resolved with dialysis. 2. Uncontrolled hypertension improved with fluid removal and medication    adjustments. 3. End-stage renal disease on chronic hemodialysis. 4. Acute apical periodontitis of tooth #30, status post extraction. 5. Accretions. 6. Chronic periodontitis.  BRIEF HISTORY:  A 35 year old African-American male who presents with shortness of breath.  He reports also having fever and chills and pain in his mouth which has been associated with possibly an abscess tooth.  He has been placed on amoxicillin.  Additionally, he has had a productive cough of white sputum tinged with blood.  He had dyspnea on exertion and he missed dialysis last treatment secondary to dealing with his abscess tooth.  He has not been dialyzed for four days prior to this admission.  He denies chest pain, palpitations, nausea, vomiting, or diarrhea.  He has had some lower extremity edema.  LABS ON ADMISSION:  Chest x-ray shows pulmonary edema with bilateral pleural effusions.  Sodium 137, potassium 5.2, BUN 83, creatinine 22.5, glucose 96, albumin 2.7, calcium 8.2, phosphatase 6.1, bicarb 25.  Hemoglobin 10.2, hematocrit 29.3, platelets 181,000, white count 10,500.  HOSPITAL COURSE:  #1 - END-STAGE RENAL DISEASE WITH PULMONARY EDEMA:  The patient underwent emergency dialysis on the evening of admission.  They dialyzed 3.7 liters of ultrafiltrate  off with no drop in blood pressure.  Thereafter, he dialyzed again on March 27, 2001, for another 2.2 liters.  Post dialysis weight was 55.3 at that time and he signed off early.  His blood pressures were better controlled with fluid removal and additional medication.  Hydralazine was used in the IV form acutely. Cardura was added at 4 mg b.i.d.  His dry weight is established at 56 kg and his blood pressure at the time of discharge was approximately 160/103.  His blood pressure will be followed up at the kidney center and medication adjustments or weight adjustments will be made accordingly.  #2 - PERIODONTITIS:  A dental consult was obtained.  Cindra Eves, D.D.S., evaluated the patient. He obtained an ______________ which showed tooth #30 to be abscess. The patient underwent extraction of tooth #30 and gross debridement of his teeth and gums.  He tolerated the procedure well.  He was empirically placed on antibiotics and at the time of discharge, was sent home on Augmentin 500 mg for five more days.  DISCHARGE MEDICATIONS:  1. Nephro-Vite vitamin one daily.  2. Calcium carbonate 500 mg three with each meal.  3. Norvasc 10 mg in the morning and 20 mg in the evening.  4. Clonidine 0.3 mg b.i.d.  5. Avapro 150 mg b.i.d.  6. Monopril 40 mg b.i.d.  7. Toprol XL 200 mg b.i.d.  8. Cardura 4 mg b.i.d.  9. Klonopin 0.5 mg q.8h. p.r.n. 10. Augmentin 500 mg once a day for five more days. 11. Darvocet-N 100 one or two pills every four to six hours p.r.n. pain. 12. EPO 10,000 units IV each dialysis. 13.  Calcijex 1.0 mcg IV each dialysis.  ESTIMATED DRY WEIGHT:  56 kg. Dictated by:   Amada Jupiter, P.A. Attending:  Garnetta Buddy, M.D. DD:  05/03/99 TD:  05/03/01 Job: [phone] [id number]

## 2011-01-13 NOTE — Consult Note (Signed)
[place]. [facility]  Patient:    Adam Davis, Adam Davis                      MRN: [id number] Adm. Date:  [id number] Attending:  Learta Codding                          Consultation Report  HISTORY OF PRESENT ILLNESS:  A 35 year old black male with end-stage renal disease and severe hypertension, on multiple medicines, is admitted tonight by Dr. Andee Lineman because of congestive heart failure and severe hypertension.  He presented to hemodialysis today with a blood pressure of 210/132.  He was out of Norvasc (he stated he was not going to be allowed to use his Medicaid card again until September 1, and the pharmacy would not fill his prescription, he has no six-prescription override) but does say he was taking his other medicines.  He also told Dr. Andee Lineman he was out of Monopril, but he has the bottle with him and has three tablets left.  He complained at dialysis of left anterior chest pain, relieved with sitting forward, and was also stating he was more short of breath for the past one to two weeks with a cough.  He had his dry weight lowered at dialysis today from 59.5 to 58, but he only got to 58.8 kg secondary to cramps and refused ultrafiltration.  He was sent to the emergency room for evaluation for possible pericarditis because of the seeming pleuritic nature of his pain.  He was evaluated by Dr. Andee Lineman, who felt he probably did not have pericarditis but had malignant hypertension with congestive failure by exam and chest x-ray.  2D echo was compatible with severe hypertensive heart disease, and there was only a minimal posterior pericardial effusion.  He was therefore admitted for aggressive pharmacologic treatment and ultrafiltration for fluid removal.  Of note, he states that he is out of Norvasc and, in fact, the bottle is empty.  He also states he is out of Monopril but has pills left in the bottle. As of 6 p.m. today, he had not taken Avapro, Lopressor, or  Monopril at all. Also, as noted, his dry weight was lowered but he declined to allow the prescribed amount of volume to be removed due to cramping.  PAST MEDICAL HISTORY:  ESRD, severe hypertension, history of pericardial effusion secondary to minoxidil, history of a GI bleed in July 2001 secondary to a gastric ulcer (CLOtest positive), hyperparathyroidism, anemia on EPO, and a history of pneumonia.  ALLERGIES:  InFEd, but does receive Ferrlecit for iron deficiency.  DIALYSIS:  He dialyzes Monday, Wednesday, Friday, 3-1/2 hours, 450 blood flow, 800 dialysate flow.  His new dry weight is 58 kg.  EPO dose is 14,000 treatments per treatment, which he frequently misses due to uncontrolled hypertension.  He is not on Calcijex, and is receiving a course of Ferrlecit 62.5 mg each dialysis through August 23, then weekly.  MEDICATIONS:  His prescribed outpatient medicines include clonidine 0.3 mg b.i.d., a Catapres TTS-2 patch, Norvasc 10 mg b.i.d., Monopril 40 mg b.i.d., Avapro 150 mg a day, Lopressor 100 mg b.i.d., Nephro-Vite once a day, Os-Cal 500 mg three with meals, and Protonix 40 mg b.i.d.  PHYSICAL EXAMINATION:  GENERAL:  He is somewhat uncomfortable-appearing.  VITAL SIGNS:  Blood pressure 230/120, heart rate in the 90s, afebrile.  NECK:  Positive JVP.  HEART:  Precordium was dynamic with a dyskinetic,  laterally and inferiorly displaced PMI.  Summation gallop was present with a 2/6 systolic murmur along the left sternal border and a 1/6 apical holosystolic murmur.  No diastolic murmur or pericardial rub were heard.  ABDOMEN:  Nontender.  Bowel sounds were present.  EXTREMITIES:  He has 1-2+ bilateral lower extremity edema.  LABORATORY DATA:  Chest x-ray showed massive cardiomegaly with interstitial edema.  2D echo as per Dr. Andee Lineman.  Laboratories pending.  IMPRESSION:  Congestive heart failure and severe uncontrolled hypertension. There are several issues operative  here:  a. Volume overload in a patient who does not allow the prescribed amount of    ultrafiltration to be done due to cramping. b. Medicine availability (though, in fact, he has all but one of his medicines    with him here tonight). c. Medicine compliance (of note is the fact that he has had none of his blood    pressure medicines yet today).  PLAN:  As outlined by Dr. Andee Lineman.  As for his antihypertensives, I have increased him to his usual doses and will give doses of Lopressor, clonidine, and Norvasc this evening.  He will receive nitroglycerin and IV hydralazine per cardiology.  Will plan to dialyze him again in the morning and decrease his dry weight until his peripheral edema resolves and continue other medicines as prescribed. DD:  04/17/00 TD:  04/18/00 Job: [id number]

## 2011-01-13 NOTE — Discharge Summary (Signed)
[place]. [facility]  Patient:    Adam, Davis                      MRN: [id number] Adm. Date:  [id number] Disc. Date: [id number] Attending:  Hans Eden Dictator:   Tawni Millers CC:         Genene Churn. Sherin Quarry, M.D.                           Discharge Summary  DATE OF BIRTH:  [birth year]  DISCHARGE DIAGNOSES:  1. Upper gastrointestinal bleed secondary to peptic ulcer or antral ulcer     with a visible ulcer, status post esophagogastroduodenoscopy with     saline injection by Genene Churn. Sherin Quarry, M.D., at this admission.  The   patient also has antral gastritis and duodenitis.  Biopsy of this lesion     showed no malignancy.  2. Anemia secondary to upper gastrointestinal bleed.  The patients     hemoglobin dropped from 8.3 to 6.5, prompting the urgent     esophagogastroduodenoscopy.  3. Abdominal pain and hematemesis secondary to upper gastrointestinal bleed.  4. Severe hypertension.  5. End-stage renal disease.  6. History of pneumonia and sepsis.  7. History of volume overload.  8. History of marked cardiomegaly.  9. History of hyperparathyroidism. 10. History of fractured left ankle. 11. History of InFeD allergy.  DISCHARGE MEDICATIONS:  1. Nephro-Vite one p.o. q.d.  2. Calcium carbonate 500 mg two tablets three times a day with meals.  3. Epogen 10,500 units with hemodialysis.  4. Calcijex 1 mcg with hemodialysis.  5. Norvasc 10 mg one p.o. b.i.d.  6. Catapres 0.3 mg patch, change each week.  7. Clonidine 0.3 mg, take two pills two times a day.  8. Labetalol 300 mg two p.o. b.i.d.  9. Quinine 325 mg one p.o. 30 minutes to one hour prior to dialysis to     prevent cramping. 10. Klonopin 0.5 mg one p.o. q.8h. p.r.n. cramping. 11. Prevpac to use as directed for two weeks for Helicobacter pylori.  When     this is finished, the patient should be on Protonix 40 mg one p.o. q.d.  HISTORY OF PRESENT ILLNESS:  This is a 35 year old  African-American male with end-stage renal disease, who came to the hospital with approximately five days of weakness and abdominal pain along with several episodes of black hematemesis the night before admission.  On admission, he was found to have a hemoglobin of 7.9 with his baseline being somewhere over 10.  PHYSICAL EXAMINATION:  A well-developed, well-nourished, black male who is in no acute distress, but appeared uncomfortable.  VITAL SIGNS:  The blood pressure was 161/102 with a pulse of 70, which went to 149/107 with a pulse of 70 upon standing.  HEENT:  Benign.  LUNGS:  Clear.  CARDIOVASCULAR:  Regular rate and rhythm with a displaced PMI.  NECK:  No JVD.  EXTREMITIES:  No clubbing, cyanosis, or edema.  NEUROLOGIC:  Nonfocal exam.  LABORATORY DATA:  Hemoglobin 7.9 as mentioned.  White count 9.9, platelet count 183, MCV 72.6, granulocyte count 7.6.  PT 14.8, PTT 33.  Sodium 143, carbon dioxide 31, glucose 108, BUN 59, creatinine 17.7, calcium 8.6.  His LFTs were within normal limits.  His amylase and lipase were also normal.  HOSPITAL COURSE: #1 - GASTROINTESTINAL BLEEDING:  The patient was followed with serial H&Hs. He received two units of packed  red blood cells in dialysis after admission. Several hours later his hemoglobin was only 8.3.  The hemoglobin was checked again six hours later and had fallen to 6.5.  At this time, Genene Churn. Sherin Quarry, M.D., was called for a more urgent EGD than had been previously planned.  The patient was found to have bleeding from a 0.5 cm benign-appearing antral ulcer with a visible vessel, which was injected with saline/epinephrine.  EGD also showed antral gastritis and duodenitis in the duodenal bulb.  The patient was continued on the IV Protonix drip that had been started upon admission.  An Helicobacter pylori test was checked and it came back positive.  Therefore, the patient was started on Prevpac prior to discharge.  At the time  of discharge, the patients hemoglobin had remained stable for a couple of days and his abdominal pain had resolved.  #2 - ANEMIA:  As described above.  At discharge, the hemoglobin had stabilized at 10.  Iron studies showed iron 101, a TIBC of 150, a percent saturation of 67, and a ferritin of 820.  The patient is certainly not iron deficient.  His Epogen level was adjusted upward to 10,500 units with dialysis, which he will be sent out on.  A CBC shall be checked as an outpatient to ensure there is no rebleeding.  #3 - HYPERTENSION:  The patient is known to be baseline severe hypertensive, which is difficult to control.  This was not exception this admission.  He was initially admitted to 5500, but with his systolic blood pressure being over 200 and his GI bleeding, he was transferred to the unit and a labetalol drip was started to control his blood pressure.  He was converted to p.o. labetalol and the rest of his p.o. medications when he was transferred back to 5500 when his hemoglobin stabilized.  His pressure on the morning of admission was 160s/100s, which is pretty much where he runs.  Will be dialyzing the patient one more time prior to discharge to attempt to remove some fluid.  I have sent him home on slightly increased levels of clonidine and have sent him home on labetalol, which I believe is an increased dose as well.  This should be followed as an outpatient and medicines adjusted as needed.  #4 - END-STAGE RENAL DISEASE:  The patient was dialyzed the night of admission due to the fact that he needed blood.  He was then also dialyzed on Monday, March 19, 2000, to attempt to remove fluid and get his blood pressure under better control.  His dialysis has been limited by severe cramping.  Prior to his dialysis on the day of discharge, the patient received quinine 30-60 minutes before dialysis.  I have also sent him home with a prescription for quinine, as well as for p.r.n.  Klonopin.  He is to use the quinine 30-60 minutes prior to initiation of dialysis.  He will return to the  [facility] for dialysis on Thursday, March 22, 2000.  DISPOSITION:  The patient was sent home in stable condition.  FOLLOW-UP:  Will follow up in dialysis on March 22, 2000. DD:  03/20/00 TD:  03/22/00 Job: [id number]

## 2011-01-13 NOTE — Op Note (Signed)
[place]. [facility]  Patient:    Adam Davis, Adam Davis                      MRN: [id number] Proc. Date: 03/29/01 Adm. Date:  [id number] Disc. Date: [id number] Attending:  Garnetta Buddy CC:         Wilber Bihari. Caryn Section, M.D.   Operative Report  DATE OF BIRTH:  March 02, 1975.  PREOPERATIVE DIAGNOSES: 1. End-stage renal disease with hemodialysis. 2. Hypertension. 3. History of right facial swelling. 4. Acute apical periodontitis of tooth #30. 5. Chronic periodontitis. 6. Accretions.  POSTOPERATIVE DIAGNOSES: 1. End-stage renal disease with hemodialysis. 2. Hypertension. 3. History of right facial swelling. 4. Acute apical periodontitis of tooth #30. 5. Chronic periodontitis. 6. Accretions.  PROCEDURE: 1. Dental examination. 2. Extraction of tooth #30 with alveoplasty. 3. Gross debridement of the remaining dentition.  SURGEON:  Cindra Eves, D.D.S.  ASSISTANTS: 1. Doris Ivin Booty Event organiser) 2. Delman Kitten ([employer] student)  ANESTHESIA:  Monitored anesthesia care per the anesthesia team.  MEDICATIONS: 1. Clindamycin 600 mg IV prior to the dental extractions. 2. Local anesthesia with a total utilization of 2 carpules each containing 36    mg of Xylocaine with a 0.018 mg of epinephrine.  SPECIMENS:  There was one tooth which was discarded.  DRAINS/CULTURES:  None.  ESTIMATED BLOOD LOSS:  Less than 50 mL.  FLUIDS:  250 mL of normal saline.  COMPLICATIONS:  None.  INDICATIONS FOR PROCEDURE:  The patient had a history of right facial swelling and tooth pain.  The patient was also with end-stage renal disease who required hemodialysis on Tuesday, Thursday, Saturday.  The patient was examined and treatment planned for extraction of tooth #30, with alveoplasty was indicated as well as gross debridement of the remaining dentition.  This treatment plan was formulated to decrease the risks and  complications associated with dental infection affecting the patients systemic health.  OPERATIVE FINDINGS:  The patient was examined in operating room #5.  Tooth #30 was identified for extraction and the teeth were identified for needing a dental cleaning.  The patient was noted to have acute apical periodontitis, affecting tooth #30, chronic periodontitis and dental accretions.  These operative findings contribute to significant risk for dental disease affecting the patients systemic health.  DESCRIPTION OF PROCEDURE:  The patient was brought to the main operating room #5.  The patient was placed in a supine position on the operating room table. Monitored anesthesia care was induced per the anesthesia team.  The patient was then prepped and draped in the usual sterile fashion for a oral surgical procedure.  The oral cavity was thoroughly examined with the findings as noted above. The patient was then ready for the oral surgical procedure as follows:  Local anesthesia was administered with a total utilization of two carpules each containing 36 mg of Xylocaine with 0.018 mg of epinephrine.  This is administered at the initiation of the 1 hour long procedure.  The mandibular right quadrant was first approached.  Anesthesia was delivered as previously described.  A Woodson elevator was utilized to remove the soft tissue from the hard tissue around tooth #30.  Tooth #30 was then subluxated with a series of straight elevators.  Tooth #30 was then removed with a 23 forceps without complications.  Alveoplasty was then performed utilizing a rongeurs and bone file.  The tissues were approximated and trimmed appropriately.  The surgical site was then  irrigated with copious amounts of sterile saline.  The surgical site was then closed utilizing 3-0 chromic gut suture material in a continuous interrupted suture technique from the mesial of #31 through the distal of #29.  The remaining teeth  were then approached.  A Kavo Sonic Scaler was then utilized to remove the calculus and plaque accumulations.  A series of curettes were then utilized to remove further accretions.  The Automatic Data was then utilized to remove further accretions.  The mouth was then irrigated with copious amounts of sterile saline.  The patient was examined for complications, seeing none, the dental procedure was deemed to be complete.  A 4 x 4 gauze was then placed in the lower right extraction site to aid in hemostasis.  The patient was then handed over to the anesthesia team for final disposition.  The patient was then deemed ready for transport to the post anesthesia care unit which was accomplished with stable vital signs and good oxygenation level. DD:  03/29/01 TD:  03/30/01 Job: [id number]

## 2011-01-13 NOTE — H&P (Signed)
[place]. [facility]  Patient:    Adam Davis, Adam Davis                      MRN: [id number] Adm. Date:  03/26/01 Attending:  Garnetta Buddy, M.D. Dictator:   Marene Lenz, M.D.                         History and Physical  CHIEF COMPLAINT:  Shortness of breath.  PRESENT ILLNESS:  This is a 35 year old African-American male presenting with increased shortness of breath starting the night prior to admission.  Patient has a past medical history significant for end-stage renal disease, on hemodialysis, hypertension, pericardial effusion and anemia.  Patient reports he has been having fever and chills and had come to the emergency department on Saturday, three days prior to this admission, for pain in his mouth associated with the fever and chills.  At that time, patient was diagnosed with a tooth abscess, was started on amoxicillin and sent home on antibiotic and pain medication.  Patient states that the next day, secondary to the pain medication, he slept most of the time and had no further complaints.  The night prior to this admission, he started having increased fevers, chills, increased shortness of breath and a productive cough.  Patients cough produced a white sputum tinged with blood.  Patient reports that shortness of breath increased over the day to the point where it limited his ambulation. Patient also reports he missed dialysis on Saturday secondary to dealing with his abscess in his mouth; patient has thus not had hemodialysis for four days prior to this admission.  Patient denies any chest pain, palpitations, nausea, vomiting, diarrhea. Patient does admit to increased swelling in his lower extremities.  Patient denies any sick contacts.  PAST MEDICAL HISTORY: 1. End-stage renal disease -- on hemodialysis since May 1997 -- secondary to    chronic glomerulonephritis, with a history of nephrotic syndrome starting    at the age of 66. 2. Hypertension  where he reports his blood pressure after dialysis is usually    systolic of 190s over a diastolic in the low 100s. 3. Anemia. 4. Secondary hyperparathyroidism. 5. History of coagulase-negative Staph sepsis in 1997. 6. Pericardial effusion secondary to minoxidil in January of 2001. 7. Upper GI bleed in July of 2001, diagnosed with peptic ulcer disease,    gastritis, "duodenitis." 8. Hemodialysis is on Tuesday, Thursday, Saturday at [facility].  His    estimated dry weight is 58 kg.  Past medical history negative for coronary artery disease.  MEDICATIONS ON ADMISSION: 1. Norvasc 10 mg q.a.m., 20 mg q.p.m. 2. Os-Cal 500 mg four tabs with each meal. 3. Prinivil 40 mg b.i.d. 4. Toprol-XL 200 mg q.12h. 5. Clonidine 0.3 mg q.12h. 6. Avapro 150 mg b.i.d. 7. Nephro-Vite one tab once a day. 8. Klonopin 0.5 mg q.8h. p.r.n.  SOCIAL HISTORY:  He denies alcohol, tobacco, drugs.  REVIEW OF SYSTEMS:  Patient admits to a productive cough of blood-tinged sputum for a day.  He denies any chest pain, palpitations or dizziness.  He admits to shortness of breath that has been present for one day prior to admission.  He reports that shortness of breath is increasing.  He also admits to fever and chills which have been present since diagnosis of his abscess in his mouth.  He denies any nausea or vomiting.  PHYSICAL EXAMINATION:  VITAL SIGNS:  Temperature on admission  was 101.1, pulse 99, respirations in the 30s.  Blood pressure was 170/90.  His oxygen saturation was 66% on room air, which increased into the 80s when placed on 100% face mask.  GENERAL:  The patient is coughing, appearing in moderate-to-severe respiratory distress.  HEENT:  Pale conjunctivae.  Oral exam is deferred secondary to need for 100% mask for respiratory status.  NECK:  Patient does have positive elevated JVD.  CHEST:  Exam reveals bilateral crackles throughout all lung fields.  HEART:  Heart sounds are tachycardic  yet regular.  No murmurs are appreciated.  ABDOMEN:  Exam reveals positive bowel sounds.  Soft, nontender, nondistended. There is no guarding or rebound.  EXTREMITIES:  Pitting edema 2 to 3+ bilaterally in the lower extremities.  SKIN:  Patient has a graft in the right forearm with positive thrill.  LABORATORY DATA:  X-ray revealed CHF/pulumonary edema with bilateral small effusions.  Labs done with hemodialysis are a sodium of 137, potassium of 5.2, BUN of 83, creatinine of 22.5, glucose of 96, albumin of 2.7, calcium at 8.2, phosphatase of 6.1, bicarb of 25.  Hemoglobin 10.2, hematocrit 29.3, platelets 181,000, WBC 10.5.  ASSESSMENT AND PLAN:  This is a 35 year old African-American male with end-stage renal disease, on hemodialysis, who now presents after four days without dialysis with increased shortness of breath and a recent diagnosis of an oral abscess, on antibiotics.  Patient is in moderate-to-severe respiratory distress with obvious crackles throughout lungs and correlating chest x-ray for pulmonary edema.  It appears most likely that this is related to patient not having dialysis for the last four days; must also consider an infectious component since patient has fever, but most likely fever is related to abscess.  1. Shortness of breath.  At this time, it appears most likely fluid overload.    Patient will have emergent dialysis in the emergency department, trying to    at least get off 5 L.  Patient will also be started on second generation of    cephalosporins to cover for any type of pneumonia and also cover for his    infection in his mouth.  Will try to wean patients oxygen off after    dialysis.  Patient may need repeat dialysis if fluid reaccumulates quickly    and saturations do not increase. 2. Hypertension.  Patient has extremely elevated blood pressure, yet looking    back, patients blood pressure seems to be always elevated and is often in    the 190s for  systolic and the 161 range diastolic after dialysis    treatments.  Will restart patients blood pressure medications after     dialysis.  Will use nitroglycerin paste to further control blood pressure. 3. End-stage renal disease.  Patient dialyzes normally on Tuesday, Thursday,    Saturday.  Will continue on Epogen and Calcijex as he was receiving as an    outpatient. DD:  03/26/01 TD:  03/27/01 Job: [id number]

## 2011-01-13 NOTE — Consult Note (Signed)
[place]. [facility]  Patient:    Adam Davis, Adam Davis                      MRN: [id number] Proc. Date: 03/27/01 Adm. Date:  [id number] Attending:  Garnetta Buddy CC:         Wilber Bihari. Caryn Section, M.D.   Consultation Report  DATE OF BIRTH:  [birth year]  REQUESTING PHYSICIAN:  Wilber Bihari. Caryn Section, M.D., Nephrology  INTRODUCTION:  Adam Davis is a 35 year old African-American male who was referred by Dr. Marina Gravel for a dental consultation.  Patient with end-stage renal disease and hemodialysis on Tuesday, Thursday, and Saturday.  Patient admitted with a history of noncompliance with hemodialysis secondary to tooth pain.  Dental consultation was requested for evaluation of lower right toothache pain.  MEDICAL HISTORY: 1. End-stage renal disease with hemodialysis on Tuesday, Thursday, and    Saturday. 2. Hypertension--currently uncontrolled. 3. Anemia. 4. Secondary hyperparathyroidism. 5. History of pericardial effusion secondary to minoxidil therapy in    January 2001. 6. History of peptic ulcer disease.  ALLERGIES/ADVERSE DRUG REACTIONS:  None known.  MEDICATIONS:  1. Norvasc 10 mg daily.  2. Norvasc 20 mg at bedtime.  3. Prinivil 40 mg every 12 hours.  4. Toprol XL every 12 hours.  5. Catapres 0.3 mg every 12 hours.  6. Avapro 150 mg twice daily.  7. Nephro-Vite vitamins daily.  8. Chlorhexidine rinse 15 mL rinsing four times daily.  9. Augmentin 500 mg daily. 10. Erythropoietin with dialysis. 11. Calcitriol 1 mcg on dialysis days. 12. Calcium carbonate 1000 mg three times daily.  SOCIAL HISTORY:  The patient currently denies the use of alcohol or tobacco.  FAMILY HISTORY:  Noncontributory.  REVIEW OF SYSTEMS:  This is reviewed from chart/health history assessment form--this admission.  FUNCTIONAL ASSESSMENT:  The patient was independent for ADLs prior to this admission.  DENTAL HISTORY:  CHIEF COMPLAINT:  Dental consultation was requested  for evaluation of a lower right toothache.  This prevented the patient from undergoing regularly scheduled hemodialysis.  The patient subsequently admitted for hemodialysis and evaluation of the lower right toothache.  The patient currently denies toothache pain at this time.  The patient had had previous pain with a 10/10 intensity.  The pain was described as dull and aching in nature.  The patient indicates that it also throbbed at times.  This pain was relieved with the use of antibiotics and pain medications.  The patient previously had been on an amoxicillin therapy and has been on Augmentin therapy since his admission.  The patient last saw his private dentist approximately 2-3 months ago for a dental cleaning.  The patient indicates that Dr. [name] was his private dentist at that time.  The patient was to follow up with extraction of the offending tooth but was unable to do so secondary to the acute nature of his medical problems.  DENTAL EXAMINATION:  GENERAL:  The patient is a well-developed, well-nourished African-American male laying in his hospital bed.  HEAD AND NECK:  Patient with some lower right submandibular swelling associated with a lymph node.  The patient denies acute TMJ symptoms at this time.  The patient denies problems opening and closing at this time.  INTRAORAL EXAMINATION:  The patient has normal saliva.  There is no significant abscess formation noted at this time.  PERIODONTAL:  Patient with chronic periodontitis with plaque and calculus accumulations, selective gingival recession, and incipient to moderate bone loss.  DENTITION:  The patient is missing tooth #19 only.  Tooth #1, #16, and #32 are impacted.  DENTAL CARIES:  Dental caries are noted upon clinical examination.  I would suggest a full series of dental radiographs and a more comprehensive examination to evaluate the presence of the dental caries.  This should be done by the private  dentist of his choice in the future.  ENDODONTIC:  Patient with a history of acute, irreversible pulpitis.  Tooth #30 has an area of apical radiolucency around the root of #30.  Tooth #32 possibly has a coronal radiolucency associated with it but needs further visualization with a more diagnostic panoramic x-ray.  CROWN AND BRIDGE:  There are no crown or bridge restorations.  OCCLUSION:  The occlusion appears to be stable at this time.  RADIOGRAPHIC INTERPRETATION:  A suboptimal panoramic x-ray was taken on March 27, 2001.  There is a missing tooth #19.  There is an impacted tooth #1, #16, and #32. There is a periapical radiolucency around the apex of tooth #30.  There is a possible coronal radiolucency associated with tooth #32.  I would suggest a full series of dental radiographs to further identify incipient dental caries.  ASSESSMENT: 1. History of acute, irreversible pulpitis of tooth #30 with acute apical    periodontitis. 2. History of right facial swelling. 3. Chronic periodontitis with bone loss. 4. Selective areas of gingival recession. 5. Dental caries--suggest a full series of dental radiographs and    comprehensive examination to identify the exact number of dental caries. 6. Missing tooth #19. 7. Impacted tooth #1, #16, and #32. 8. Questionable coronal radiolucency associated with tooth #32.  PLAN/RECOMMENDATIONS: 1. I have discussed the risks, benefits, and complications of various    treatment options with the patient in relationship to his medical and    dental condition.  We discussed extraction of tooth #30 with alveoloplasty    as indicated, extraction of his wisdom teeth, periodontal therapy, dental    restorations, crown or bridge therapy, root canal therapy, implant therapy,    and replacement of missing teeth as indicated.  The patient wishes to have    tooth #30 extracted with alveoloplasty as indicated.  He also wishes to     have his periodontal  therapy performed during the same time as the dental    extraction.  This procedure will be done in the operating room with    monitored anesthesia care as indicated.  The patient will then follow up    for a comprehensive dental examination with a full series of dental    radiographs and other dental treatment as needed at that time. 2. Discussion of findings with Dr. Marina Gravel as indicated. DD:  03/28/01 TD:  03/29/01 Job: [id number]

## 2011-01-13 NOTE — H&P (Signed)
NAMESANJIV, Adam Davis                         ACCOUNT NO.:  [id number]   MEDICAL RECORD NO.:  [id number]                   PATIENT TYPE:  INP   LOCATION:  5506                                 FACILITY:  [facility]   PHYSICIAN:  [name], M.D.            DATE OF BIRTH:  [birth year]   DATE OF ADMISSION:  07/14/2003  DATE OF DISCHARGE:                                HISTORY & PHYSICAL   HISTORY OF PRESENT ILLNESS:  The patient is currently now intubated in the  emergency department.  Reportedly per nursing, the patient was found by his  mother lying in his doorway at home extremely short of breath.  His mother  called EMS.  Upon arrival, the patient was extremely short of breath and  placed on oxygen.  The patient had reported systolic blood pressure of over  200.  The patient was electively intubated in the emergency department  secondary to saturation of 40% on 100% nonrebreather.   PAST MEDICAL HISTORY:  1. The patient receives hemodialysis since 1997.  He dialyzes Tuesday,     Thursday, and Saturday at [facility].  End-stage renal disease is     secondary to a glomerulonephropathy.  He has a history of nephrotic     syndrome since age 60.  2. Hypertension, not well controlled as blood pressure is usually in the     190s or even low 200s at dialysis.  3. Anemia.  4. Secondary hyperparathyroidism.  5. History of coagulase-negative staph sepsis August 1997.  6. History of pericardial effusion September 03, 1999, felt to be secondary to     minoxidil.  7. History of upper GI bleeding July 2001 secondary to peptic ulcer disease,     gastritis, and duodenitis.  8. Chronic periodontitis.  9. History of noncompliance.   REVIEW OF SYSTEMS:  Unable to obtain.   MEDICATIONS:  Could not obtain at this time.  Reportedly, his mother sent  them with EMS, but we are unable to locate them at this time.   ALLERGIES:  No known drug allergies.   FAMILY HISTORY:  Noncontributory.   SOCIAL  HISTORY:  There is no history on record of tobacco or alcohol use.  The patient lives with his mother here in [place].   PHYSICAL EXAMINATION:  VITAL SIGNS:  Blood pressure 228/148, heart rate 90,  respirations 44, temperature currently 97.3.  GENERAL: The patient is intubated and sedated but very diaphoretic.  HEENT:  Head is atraumatic and normocephalic.  Pupils are small bilaterally  but reactive and equal.  Oropharynx is difficult to assess because of the  intubation.  NECK:  Trachea in midline.  No thyromegaly, no lymphadenopathy.  CARDIAC:  Regular rate and rhythm with a very loud S1 and S2.  PULMONARY:  Diffuse crackles and rhonchi bilaterally throughout lung fields  anterior and posteriorly.  ABDOMEN:  Soft, nontender, nondistended.  Increased tympany, decreased bowel  sounds.  No bruits  detected.  EXTREMITIES:  1+ pitting edema half way up the tibia bilaterally.  Some  chronic venostatic changes around the ankles and also prominent graft in the  right forearm with a loud thrill.   LABORATORY DATA:  An I-STAT gave [place]  sodium 138, potassium 4.8, chloride  101, bicarb 34, BUN 53, glucose 140.  Hemoglobin 13, hematocrit 39.  First  ABG had a pH of 7.25, PCO2 74, and a PO2 of 32.  That was on FIO2 of 100%.  The third ABG in the ED reported a pH of 7.21, PCO2 of 78, and PO2 of 60,  and that was initially after being intubated.   EKG:  Rate of 78 beats per minute with sinus rhythm.  There is some left  ventricular hypertrophy and inverted T waves in V6.   ASSESSMENT/PLAN:  A 35 year old with end-stage renal disease on dialysis  with poorly controlled hypertension who cones in acute pulmonary edema.   1. Acute pulmonary edema most likely secondary to congestive heart failure.     The patient does appear very volume overloaded with his pitting edema and     crackles all the way up his lung fields.  We will rule out coronary     syndrome as a cause as patient is young, and this is  much less likely.     Will check blood cultures and urine cultures to rule out any types of     infection, and we will dialyze patient immediately to help with volume     overload.  2. End-stage renal disease.  Plan to dialyze immediately and pull 4 to 5     liters as soon as possible.  The patient may need a repeat dialysis later     today.  This will hopefully help with his oxygenation, and we can     potentially extubate him.  Potassium is currently stable, but we will     continue to monitor this.  We will check a renal panel now and get a CBC.  3. Ventilator-dependent respiratory failure secondary to congestive heart     failure as above.  Hopefully can wean once patient is diuresed.  We will     recheck an ABG in one hour with dialysis.  4. Anemia.  Hemoglobin is 13 on I-STAT which appears to be stable.  Will     check a CBC with differential.  5. Secondary hyperparathyroidism.  Will get home medication list and     hemodialysis medication list from [facility] and will recheck a renal     panel.  6. Hypertension.  The patient lives with very elevated systolic blood     pressure.  At the moment, we will hold his blood pressure medications     until he dialyzes and the restart them.  It appears the patient does have     some compliance issues but has shown in the past to have better blood     pressure on minoxidil.                                                [name], M.D.    CM/MEDQ  D:  07/22/2003  T:  07/22/2003  Job:  [id number]   cc:   [facility]   Adam Davis,  M.D. 

## 2012-03-21 ENCOUNTER — Other Ambulatory Visit (HOSPITAL_COMMUNITY): Payer: Self-pay | Admitting: Nephrology

## 2012-03-21 DIAGNOSIS — Z940 Kidney transplant status: Secondary | ICD-10-CM

## 2012-03-22 ENCOUNTER — Ambulatory Visit (HOSPITAL_COMMUNITY)
Admission: RE | Admit: 2012-03-22 | Discharge: 2012-03-22 | Disposition: A | Discharge status: Home / Self Care | Payer: Self-pay | Source: Ambulatory Visit | Attending: Nephrology | Admitting: Nephrology

## 2012-03-22 DIAGNOSIS — N269 Renal sclerosis, unspecified: Secondary | ICD-10-CM | POA: Insufficient documentation

## 2012-03-22 DIAGNOSIS — Z940 Kidney transplant status: Secondary | ICD-10-CM

## 2012-05-16 ENCOUNTER — Emergency Department (HOSPITAL_COMMUNITY)
Admission: EM | Admit: 2012-05-16 | Discharge: 2012-05-17 | Disposition: A | Discharge status: Home / Self Care | Payer: Self-pay | Attending: Emergency Medicine | Admitting: Emergency Medicine

## 2012-05-16 ENCOUNTER — Encounter (HOSPITAL_COMMUNITY): Payer: Self-pay | Admitting: Emergency Medicine

## 2012-05-16 ENCOUNTER — Emergency Department (INDEPENDENT_AMBULATORY_CARE_PROVIDER_SITE_OTHER)
Admission: EM | Admit: 2012-05-16 | Discharge: 2012-05-16 | Disposition: A | Discharge status: Nursing Home (Includes ICF) | Payer: Self-pay | Source: Home / Self Care | Attending: Family Medicine | Admitting: Family Medicine

## 2012-05-16 ENCOUNTER — Encounter (HOSPITAL_COMMUNITY): Payer: Self-pay | Admitting: *Deleted

## 2012-05-16 DIAGNOSIS — R7309 Other abnormal glucose: Secondary | ICD-10-CM

## 2012-05-16 DIAGNOSIS — R739 Hyperglycemia, unspecified: Secondary | ICD-10-CM

## 2012-05-16 DIAGNOSIS — Z940 Kidney transplant status: Secondary | ICD-10-CM | POA: Insufficient documentation

## 2012-05-16 DIAGNOSIS — E119 Type 2 diabetes mellitus without complications: Secondary | ICD-10-CM | POA: Insufficient documentation

## 2012-05-16 DIAGNOSIS — Z794 Long term (current) use of insulin: Secondary | ICD-10-CM | POA: Insufficient documentation

## 2012-05-16 LAB — BASIC METABOLIC PANEL
BUN: 34 mg/dL — ABNORMAL HIGH (ref 6–23)
Chloride: 93 mEq/L — ABNORMAL LOW (ref 96–112)
GFR calc Af Amer: 59 mL/min — ABNORMAL LOW (ref >90)
GFR calc non Af Amer: 51 mL/min — ABNORMAL LOW (ref >90)
Potassium: 4.4 mEq/L (ref 3.5–5.1)
Sodium: 130 mEq/L — ABNORMAL LOW (ref 135–145)

## 2012-05-16 LAB — POCT I-STAT, CHEM 8
BUN: 38 mg/dL — ABNORMAL HIGH (ref 6–23)
Creatinine, Ser: 1.6 mg/dL — ABNORMAL HIGH (ref 0.50–1.35)
Hemoglobin: 16.7 g/dL (ref 13.0–17.0)
Potassium: 4.3 mEq/L (ref 3.5–5.1)
Sodium: 132 mEq/L — ABNORMAL LOW (ref 135–145)
TCO2: 24 mmol/L (ref 0–100)

## 2012-05-16 LAB — POCT URINALYSIS DIP (DEVICE)
Glucose, UA: 500 mg/dL — AB
Ketones, ur: NEGATIVE mg/dL
Leukocytes, UA: NEGATIVE
Protein, ur: NEGATIVE mg/dL
Specific Gravity, Urine: <=1.005 (ref 1.005–1.030)
Urobilinogen, UA: 0.2 mg/dL (ref 0.0–1.0)

## 2012-05-16 LAB — URINALYSIS, ROUTINE W REFLEX MICROSCOPIC
Leukocytes, UA: NEGATIVE
Nitrite: NEGATIVE
Specific Gravity, Urine: 1.027 (ref 1.005–1.030)
Urobilinogen, UA: 0.2 mg/dL (ref 0.0–1.0)
pH: 5.5 (ref 5.0–8.0)

## 2012-05-16 LAB — URINE MICROSCOPIC-ADD ON

## 2012-05-16 LAB — GLUCOSE, CAPILLARY: Glucose-Capillary: 436 mg/dL — ABNORMAL HIGH (ref 70–99)

## 2012-05-16 MED ORDER — INSULIN GLARGINE 100 UNIT/ML ~~LOC~~ SOLN: 60.0000 [IU] | Freq: Every day | SUBCUTANEOUS | Status: DC

## 2012-05-16 MED ORDER — SODIUM CHLORIDE 0.9 % IV SOLN
INTRAVENOUS | Status: DC
  Administered 2012-05-16: 4.2 [IU]/h via INTRAVENOUS
  Filled 2012-05-16: qty 1

## 2012-05-16 MED ORDER — SODIUM CHLORIDE 0.9 % IV BOLUS (SEPSIS)
1000.0000 mL | Freq: Once | INTRAVENOUS | Status: AC
  Administered 2012-05-16: 1000 mL via INTRAVENOUS

## 2012-05-16 NOTE — ED Provider Notes (Signed)
History     CSN: [id number]  Arrival date & time 05/16/12  4098   First MD Initiated Contact with Patient 05/16/12 1938      Chief Complaint  Patient presents with  . Hyperglycemia    (Consider location/radiation/quality/duration/timing/severity/associated sxs/prior treatment) The history is provided by the patient.   36 year old, male, with a history of renal transplant, and diabetes mellitus, presents emergency department complaining of polyuria, polydipsia, and generalized myalgias.  He states that he has run out of his Lantus insulin since health service closed.  He denies nausea, vomiting, fevers, chills, cough, or shortness of breath.  Past Medical History  Diagnosis Date  . Diabetes mellitus     Past Surgical History  Procedure Date  . Kidney surgery 2006    History reviewed. No pertinent family history.  History  Substance Use Topics  . Smoking status: Never Smoker   . Smokeless tobacco: Not on file  . Alcohol Use: No      Review of Systems  Constitutional: Negative for fever, chills and diaphoresis.  HENT: Negative for neck pain.   Eyes: Positive for visual disturbance.  Respiratory: Negative for cough and shortness of breath.   Cardiovascular: Negative for chest pain.  Gastrointestinal: Negative for nausea, vomiting and abdominal pain.       Polydipsia  Genitourinary: Positive for frequency.  Musculoskeletal: Positive for myalgias. Negative for back pain.  Skin: Negative for rash.  Neurological: Negative for weakness and headaches.  Psychiatric/Behavioral: Negative for confusion.  All other systems reviewed and are negative.    Allergies  Review of patient's allergies indicates no known allergies.  Home Medications   Current Outpatient Rx  Name Route Sig Dispense Refill  . INSULIN GLARGINE 100 UNIT/ML [place] SOLN Subcutaneous Inject 60 Units into the skin at bedtime. 10 mL 2    BP 140/97  Pulse 72  Temp 97.7 F (36.5 C) (Oral)  Resp 18   SpO2 100%  Physical Exam  Nursing note and vitals reviewed. Constitutional: He is oriented to person, place, and time. He appears well-developed and well-nourished. No distress.  HENT:  Head: Normocephalic and atraumatic.  Eyes: Conjunctivae normal and EOM are normal.  Neck: Normal range of motion. Neck supple.  Cardiovascular: Normal rate and intact distal pulses.   No murmur heard. Pulmonary/Chest: Effort normal and breath sounds normal. No respiratory distress.  Abdominal: Soft. There is no tenderness.  Musculoskeletal: Normal range of motion. He exhibits no edema.  Neurological: He is alert and oriented to person, place, and time.  Skin: Skin is warm and dry.  Psychiatric: He has a normal mood and affect. Thought content normal.    ED Course  Procedures (including critical care time)   Labs Reviewed  BASIC METABOLIC PANEL  URINALYSIS, ROUTINE W REFLEX MICROSCOPIC   No results found.   No diagnosis found.    MDM  Diabetic, male, who has run out of his insulin, presents with symptoms consistent with hyperglycemia.  He is in no distress        Cheri Guppy, MD 05/16/12 [phone]

## 2012-05-16 NOTE — ED Provider Notes (Signed)
History     CSN: [id number]  Arrival date & time 05/16/12  1650   First MD Initiated Contact with Patient 05/16/12 1651      Chief Complaint  Patient presents with  . Medication Refill  . Dizziness  . Blurred Vision    (Consider location/radiation/quality/duration/timing/severity/associated sxs/prior treatment) HPI Comments: 36 year old male with history of insulin-dependent diabetes and kidney transplant. Here complaining of blurry vision and dizziness. Has been off his Lantus insulin since Monday (off for 3 days). Patient has had issues with noncompliance in the past. Denies vomiting, abdominal pain or diarrhea. Denies foot infections or skin sores.   Past Medical History  Diagnosis Date  . Diabetes mellitus     Past Surgical History  Procedure Date  . Kidney surgery 2006    No family history on file.  History  Substance Use Topics  . Smoking status: Never Smoker   . Smokeless tobacco: Not on file  . Alcohol Use: No      Review of Systems  Constitutional: Negative for fever and chills.  HENT: Negative for congestion and sore throat.   Eyes: Positive for visual disturbance.  Respiratory: Negative for cough and shortness of breath.   Cardiovascular: Negative for chest pain.  Gastrointestinal: Negative for nausea, vomiting, abdominal pain and diarrhea.  Skin: Negative for rash.  Neurological: Positive for dizziness. Negative for seizures, syncope and weakness.  All other systems reviewed and are negative.    Allergies  Review of patient's allergies indicates no known allergies.  Home Medications   Current Outpatient Rx  Name Route Sig Dispense Refill  . INSULIN GLARGINE 100 UNIT/ML [place] SOLN Subcutaneous Inject 60 Units into the skin at bedtime. 10 mL 2    BP 119/85  Pulse 83  Temp 98 F (36.7 C) (Oral)  Resp 16  SpO2 97%  Physical Exam  Nursing note and vitals reviewed. Constitutional: He is oriented to person, place, and time. He appears  well-developed. No distress.       Sleepy, laying in bed.  HENT:  Head: Normocephalic and atraumatic.  Mouth/Throat: No oropharyngeal exudate.       Dry lips.  Eyes: EOM are normal. Pupils are equal, round, and reactive to light. No scleral icterus.       Conjunctival injection bilateral. No swelling,  Chemosis or drainage.  Neck: Neck supple. No JVD present. No thyromegaly present.  Cardiovascular: Normal rate, regular rhythm and normal heart sounds.   Pulmonary/Chest: Effort normal and breath sounds normal.  Abdominal: Soft. There is no tenderness.  Lymphadenopathy:    He has no cervical adenopathy.  Neurological: He is oriented to person, place, and time. He has normal reflexes.       Looks drowsy.  Skin: No rash noted. He is not diaphoretic.    ED Course  Procedures (including critical care time)  Labs Reviewed  POCT URINALYSIS DIP (DEVICE) - Abnormal; Notable for the following:    Glucose, UA 500 (*)     Hgb urine dipstick TRACE (*)     All other components within normal limits  POCT I-STAT, CHEM 8 - Abnormal; Notable for the following:    Sodium 132 (*)     BUN 38 (*)     Creatinine, Ser 1.60 (*)     Glucose, Bld 468 (*)     Calcium, Ion 1.32 (*)     All other components within normal limits   No results found.   1. Hyperglycemia  MDM  36 year old male with history of insulin-dependent diabetes and kidney transplant. Here complaining of blurry vision and dizziness. Has been off his Lantus insulin since Monday (off for 3 days). On exam: Stable vital signs, blood pressure 119/85 and heart rate in the high 80s. Dry lips. Impress orthostatic. Point-of-care electrolytes with mild hyponatremia likely related to hyperglycemia with a blood sugar in 468. Creatinine stable at 1.6 compare with prior results. Decided to transfer to the emergency department for further evaluation and management.       And  Sharin Grave, MD 05/16/12 1920

## 2012-05-16 NOTE — ED Notes (Signed)
Patient has been out of his lantus since Monday.  Patient sent here from [facility] [place] for further evaluation of his hyperglycemia.  Patient also c/o dizziness and blurred vision.

## 2012-05-16 NOTE — ED Notes (Signed)
Pt here for Rx refill Lantus 60 units last used Monday night.[facility].Has new appt with family practice OCT.3RD.c/o muscle aches,dizziness and blurry vision.denies n/v/d.vss.Also will receive rx sample from medical assistance program.

## 2012-05-16 NOTE — Discharge Instructions (Signed)
Your blood sugar is improved.  Use Lantus insulin to control your blood sugar.  Followup with your physician, as scheduled.  Return for worse or uncontrolled symptoms

## 2012-05-20 NOTE — Care Management Note (Signed)
    Page 1 of 1   05/20/2012     1:26:21 PM   CARE MANAGEMENT NOTE 05/20/2012  Patient:  Adam Davis, Adam Davis   Account Number:  [id number]  Date Initiated:  05/20/2012  Documentation initiated by:  Alvira Philips Assessment:   36 yr-old male in ED with elevated glucose, no insulin     DC Planning Services  CM consult      Comments:  05/16/12 Received TC requesting authorization for vial of insulin for self-pay pt who has been without insulin for several days.  Per pharmacy, pt has not accessed [employer] funds this year, will provide vial of insulin.  TC to RN in ED, he will provide script to pharmacy.

## 2012-05-21 ENCOUNTER — Encounter: Payer: Self-pay | Admitting: Family Medicine

## 2012-05-21 ENCOUNTER — Ambulatory Visit (INDEPENDENT_AMBULATORY_CARE_PROVIDER_SITE_OTHER): Payer: Self-pay | Admitting: Family Medicine

## 2012-05-21 VITALS — BP 117/76 | HR 88 | Temp 98.4°F | Ht 65.0 in | Wt 165.0 lb

## 2012-05-21 DIAGNOSIS — Z940 Kidney transplant status: Secondary | ICD-10-CM

## 2012-05-21 DIAGNOSIS — E139 Other specified diabetes mellitus without complications: Secondary | ICD-10-CM

## 2012-05-21 MED ORDER — INSULIN GLARGINE 100 UNIT/ML ~~LOC~~ SOLN: 60.0000 [IU] | Freq: Every day | SUBCUTANEOUS | Status: DC

## 2012-05-21 NOTE — Patient Instructions (Signed)
It was nice to meet you.  Please go see Rudell Cobb to get your new [facility] card.  Also, [facility] and re-apply for the MAP (Medication Assistance Program).

## 2012-05-22 ENCOUNTER — Telehealth: Payer: Self-pay | Admitting: Family Medicine

## 2012-05-22 DIAGNOSIS — E139 Other specified diabetes mellitus without complications: Secondary | ICD-10-CM | POA: Insufficient documentation

## 2012-05-22 DIAGNOSIS — Z940 Kidney transplant status: Secondary | ICD-10-CM | POA: Insufficient documentation

## 2012-05-22 NOTE — Progress Notes (Signed)
  Subjective:    Patient ID: Adam Davis, male    DOB: 31-Dec-1975, 36 y.o.   MRN: [id number]  HPI  Adam Davis comes in to establish care.  His main concern today is his diabetes.  He was in the ER on 05/16/12 with elevated blood sugars because he ran out of his lantus.  [facility]  He was using the MAP program at the [facility] pharmacy.  He was given a vial of lantus in the ER by case management, so he has been able to take it the past few days, and he is feeling much better.  He has also lost his glucometer.    Adam Davis developed DM after a kidney transplant in 2006.  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  He says he had high blood pressure when he was on HD, but now takes metoprolol for tachycardia.  He takes Cellecpt and Prograf for his transplant, and sees Dr. Lacy Duverney at [facility].  Their office has a program to help him get those medications.   Past Medical History  Diagnosis Date  . Diabetes mellitus   . H/O acute post-streptococcal glomerulonephritis 1997  . Tachycardia   . Hyperlipidemia   . Hypertension    Family History  Problem Relation Age of Onset  . Diabetes Brother   . Diabetes Maternal Grandmother   . Hypertension Maternal Grandmother    Past Surgical History  Procedure Date  . Kidney surgery 2006  . Ankle surgery   . Av fistula placement 1997    Removed after transplant in 2006   History  Substance Use Topics  . Smoking status: Never Smoker   . Smokeless tobacco: Not on file  . Alcohol Use: No   Review of Systems See HPI    Objective:   Physical Exam BP 117/76  Pulse 88  Temp 98.4 F (36.9 C) (Oral)  Ht 5\' 5"  (1.651 m)  Wt 165 lb (74.844 kg)  BMI 27.46 kg/m2 General appearance: alert, cooperative and no distress Lungs: clear to auscultation bilaterally Heart: regular rate and rhythm, S1, S2 normal, no murmur, click, rub or gallop Extremities: extremities normal, atraumatic,  no cyanosis or edema Pulses: 2+ and symmetric       Assessment & Plan:

## 2012-05-22 NOTE — Assessment & Plan Note (Signed)
Last creatinine on record is stable, per patient report doing well on current immunosuppressive therapy. BP well controlled.

## 2012-05-22 NOTE — Assessment & Plan Note (Addendum)
Patient given one lantus solostar pen today with supplies.  He is agreeable to going down to the [facility] pharmacy and re-applying for the MAP program.  He will also go see Adam Davis to get his new orange card, he will follow up with me in about a month after he gets the card, and will plan on drawing labs at that time.

## 2012-05-22 NOTE — Telephone Encounter (Signed)
MAP returning call to Dr. Lula Olszewski.

## 2012-06-20 ENCOUNTER — Ambulatory Visit: Payer: Self-pay | Admitting: Family Medicine

## 2012-07-04 ENCOUNTER — Encounter: Payer: Self-pay | Admitting: Family Medicine

## 2012-07-04 ENCOUNTER — Ambulatory Visit (INDEPENDENT_AMBULATORY_CARE_PROVIDER_SITE_OTHER): Payer: Self-pay | Admitting: Family Medicine

## 2012-07-04 VITALS — BP 129/88 | HR 84 | Temp 98.3°F | Ht 65.0 in | Wt 170.0 lb

## 2012-07-04 DIAGNOSIS — E139 Other specified diabetes mellitus without complications: Secondary | ICD-10-CM

## 2012-07-04 DIAGNOSIS — B351 Tinea unguium: Secondary | ICD-10-CM | POA: Insufficient documentation

## 2012-07-04 DIAGNOSIS — Z940 Kidney transplant status: Secondary | ICD-10-CM

## 2012-07-04 LAB — LIPID PANEL
Cholesterol: 211 mg/dL — ABNORMAL HIGH (ref 0–200)
HDL: 33 mg/dL — ABNORMAL LOW (ref >39)
Total CHOL/HDL Ratio: 6.4 Ratio
VLDL: 55 mg/dL — ABNORMAL HIGH (ref 0–40)

## 2012-07-04 LAB — POCT URINALYSIS DIPSTICK
Protein, UA: NEGATIVE
Spec Grav, UA: 1.02
Urobilinogen, UA: 0.2
pH, UA: 5.5

## 2012-07-04 LAB — COMPREHENSIVE METABOLIC PANEL
AST: 28 U/L (ref 0–37)
Albumin: 4 g/dL (ref 3.5–5.2)
Alkaline Phosphatase: 64 U/L (ref 39–117)
BUN: 27 mg/dL — ABNORMAL HIGH (ref 6–23)
Potassium: 3.9 mEq/L (ref 3.5–5.3)
Sodium: 140 mEq/L (ref 135–145)

## 2012-07-04 LAB — CBC WITH DIFFERENTIAL/PLATELET
Basophils Absolute: 0 10*3/uL (ref 0.0–0.1)
Basophils Relative: 1 % (ref 0–1)
MCHC: 34.5 g/dL (ref 30.0–36.0)
Monocytes Absolute: 0.7 10*3/uL (ref 0.1–1.0)
Neutro Abs: 4.3 10*3/uL (ref 1.7–7.7)
Neutrophils Relative %: 63 % (ref 43–77)
RDW: 14.4 % (ref 11.5–15.5)

## 2012-07-04 LAB — POCT UA - MICROSCOPIC ONLY

## 2012-07-04 LAB — POCT GLYCOSYLATED HEMOGLOBIN (HGB A1C): Hemoglobin A1C: 11

## 2012-07-04 MED ORDER — CICLOPIROX & LACQUER REMOVAL 8 % EX KIT: 1.0000 "application " | PACK | Freq: Every day | CUTANEOUS | Status: DC

## 2012-07-04 NOTE — Assessment & Plan Note (Signed)
Patient's report of good blood sugar control not matching A1C of 11 today.  Discussed diet and exercise, and being sure to take lantus every day.  I have asked him to start keeping a daily blood sugar log BID, and to follow up in a month.

## 2012-07-04 NOTE — Assessment & Plan Note (Signed)
Patient is not a candidate for PO tx of this due to renal transplant and kidney disease.  Rx for nail lacquer.

## 2012-07-04 NOTE — Patient Instructions (Signed)
It was good to see you today.  Your Hemoglobin A1C is  Lab Results  Component Value Date   HGBA1C 11.0 07/04/2012  .  Remember your goal for A1C is less than 7.  Your goal for fasting morning blood sugar is 80-120.  Please be sure to take your lantus every day, and start keeping a log of your blood sugars.   To help control your blood sugars, please avoid and minimize foods with lots of carbohydrates and sugars.  Those foods include breads, pastas, potatoes, corn, sweets and deserts.  Try to increase the amount and variety of vegetables you eat, and include a veggie with each meal.    Please come back and see me in one month and make sure to bring the list of your blood sugars!

## 2012-07-04 NOTE — Addendum Note (Signed)
Addended by: [place], Lyndsi Altic on: 07/04/2012 02:21 PM   Modules accepted: Orders

## 2012-07-04 NOTE — Progress Notes (Signed)
  Subjective:    Patient ID: Adam Davis, male    DOB: [birth year], 36 y.o.   MRN: [id number]  HPI  DM: Patient is taking Lantus 60 units daily.  Patient is checking blood sugars.  Fasting sugars range from 85 to 99.  No hyper or hypoglycemic episodes, no polyuria or polydypisa.  He says the highest numbers he have seen post prandial have been around 200.   He has seen Dr. Lacy Duverney at [facility], but has not been able to have labs drawn due to insurance status.  She has written a note to me to ask for several labs to be done at our office.  He says that he is making normal urine and is not having trouble with any side effects from his cellcept and prograf.   He complains of a change in the color of his toe nails on the firs and second toes of his right foot.  He was worried this was from DM, and is wondering how to get his toenails back to a normal color.   Past Medical History  Diagnosis Date  . Diabetes mellitus   . H/O acute post-streptococcal glomerulonephritis 1997  . Tachycardia   . Hyperlipidemia   . Hypertension    Family History  Problem Relation Age of Onset  . Diabetes Brother   . Diabetes Maternal Grandmother   . Hypertension Maternal Grandmother    History  Substance Use Topics  . Smoking status: Never Smoker   . Smokeless tobacco: Not on file  . Alcohol Use: No   Review of Systems See HPI    Objective:   Physical Exam BP 129/88  Pulse 84  Temp 98.3 F (36.8 C) (Oral)  Ht 5\' 5"  (1.651 m)  Wt 170 lb (77.111 kg)  BMI 28.29 kg/m2 General appearance: alert, cooperative and no distress Lungs: clear to auscultation bilaterally Heart: regular rate and rhythm, S1, S2 normal, no murmur, click, rub or gallop Abdomen: soft, non-tender; bowel sounds normal; no masses,  no organomegaly Extremities: extremities normal, atraumatic, no cyanosis or edema and Green discoloration and thickening of toe nails on right 1st and second toes. Pulses: 2+ and  symmetric       Assessment & Plan:

## 2012-07-04 NOTE — Assessment & Plan Note (Signed)
Will check labs per Dr. Lacy Duverney.

## 2012-08-01 ENCOUNTER — Ambulatory Visit: Payer: Self-pay | Admitting: Family Medicine

## 2012-08-08 ENCOUNTER — Encounter: Payer: Self-pay | Admitting: Family Medicine

## 2012-08-08 ENCOUNTER — Ambulatory Visit (INDEPENDENT_AMBULATORY_CARE_PROVIDER_SITE_OTHER): Payer: Self-pay | Admitting: Family Medicine

## 2012-08-08 VITALS — BP 121/75 | HR 76 | Temp 98.7°F | Ht 65.0 in | Wt 168.0 lb

## 2012-08-08 DIAGNOSIS — E785 Hyperlipidemia, unspecified: Secondary | ICD-10-CM

## 2012-08-08 DIAGNOSIS — Z940 Kidney transplant status: Secondary | ICD-10-CM

## 2012-08-08 DIAGNOSIS — E139 Other specified diabetes mellitus without complications: Secondary | ICD-10-CM

## 2012-08-08 MED ORDER — INSULIN GLARGINE 100 UNIT/ML ~~LOC~~ SOLN: 40.0000 [IU] | Freq: Every day | SUBCUTANEOUS | Status: DC

## 2012-08-08 NOTE — Assessment & Plan Note (Addendum)
Patient reports good control.  Discussed goal BP of 80-120.  Told him that him feeling badly with a blood sugar of 89 suggests it has been running high, his body needs to get used to normal blood sugar.  Suggested increasing Lantus if blood sugar consistently above 120 fasting. He voices understanding. F/U in 2 months for next A1C.

## 2012-08-08 NOTE — Assessment & Plan Note (Signed)
Discussed diet modification and continuing regular exercise.  Will hold off on starting medication for now.

## 2012-08-08 NOTE — Progress Notes (Signed)
  Subjective:    Patient ID: Adam Davis, male    DOB: 12-22-1975, 36 y.o.   MRN: [id number]  HPI  Adam Davis comes in for follow up.   DM: Patient is taking Lantus.  Patient is checking blood sugars.  Fasting sugars range from 90 to 130.  He says he was feeling hypoglycemic (shakey and weak) for several days in a row when he took his old dose of 60 units of Lantus, and now he is taking 40 units and feels better.   CKD, renal transplant: Labs done last visit, Creatinine 1.83, slightly up from 1.67.  Cellcept level within therapeutic range. Will see Nephrologist again in January.   HLD- has never taken medications for this.  Is exercising, avoids fatty foods/fried foods.   I have reviewed the patient's medical history in detail and updated the computerized patient record.  Review of Systems See HPI    Objective:   Physical Exam BP 121/75  Pulse 76  Temp 98.7 F (37.1 C) (Oral)  Ht 5\' 5"  (1.651 m)  Wt 168 lb (76.204 kg)  BMI 27.96 kg/m2 General appearance: alert, cooperative and no distress Lungs: clear to auscultation bilaterally Heart: regular rate and rhythm, S1, S2 normal, no murmur, click, rub or gallop Extremities: extremities normal, atraumatic, no cyanosis or edema Pulses: 2+ and symmetric       Assessment & Plan:

## 2012-08-08 NOTE — Patient Instructions (Signed)
It was good to see you.  Please continue to take your medications as prescribed.  Your blood sugar first thing in the morning before you eat should be 80-120.  If it is running higher than that, you may need to increase your Lantus a little.  Please continue your regular exercise.    Your recent cholesterol was a little high: Lab Results  Component Value Date   CHOL 211* 07/04/2012   HDL 33* 07/04/2012   LDLCALC 123* 07/04/2012   TRIG 277* 07/04/2012   CHOLHDL 6.4 07/04/2012   Please try to reduce the cholesterol in your diet.

## 2012-08-08 NOTE — Assessment & Plan Note (Signed)
Creatinine slightly above last baseline I have records of, and cellcept level therapeutic.  He will continue to follow with [facility].

## 2012-10-18 ENCOUNTER — Ambulatory Visit: Payer: Self-pay | Admitting: Family Medicine

## 2012-10-30 ENCOUNTER — Ambulatory Visit (INDEPENDENT_AMBULATORY_CARE_PROVIDER_SITE_OTHER): Payer: Self-pay | Admitting: Family Medicine

## 2012-10-30 ENCOUNTER — Encounter: Payer: Self-pay | Admitting: Family Medicine

## 2012-10-30 VITALS — BP 130/90 | HR 80 | Temp 97.6°F | Ht 65.0 in | Wt 167.7 lb

## 2012-10-30 DIAGNOSIS — E1165 Type 2 diabetes mellitus with hyperglycemia: Secondary | ICD-10-CM

## 2012-10-30 DIAGNOSIS — I10 Essential (primary) hypertension: Secondary | ICD-10-CM

## 2012-10-30 DIAGNOSIS — IMO0001 Reserved for inherently not codable concepts without codable children: Secondary | ICD-10-CM

## 2012-10-30 DIAGNOSIS — E785 Hyperlipidemia, unspecified: Secondary | ICD-10-CM

## 2012-10-30 DIAGNOSIS — E139 Other specified diabetes mellitus without complications: Secondary | ICD-10-CM

## 2012-10-30 DIAGNOSIS — Z940 Kidney transplant status: Secondary | ICD-10-CM

## 2012-10-30 LAB — COMPREHENSIVE METABOLIC PANEL
AST: 24 U/L (ref 0–37)
Albumin: 4.4 g/dL (ref 3.5–5.2)
Alkaline Phosphatase: 69 U/L (ref 39–117)
Potassium: 4.4 mEq/L (ref 3.5–5.3)
Sodium: 139 mEq/L (ref 135–145)
Total Bilirubin: 0.5 mg/dL (ref 0.3–1.2)
Total Protein: 7 g/dL (ref 6.0–8.3)

## 2012-10-30 LAB — PHOSPHORUS: Phosphorus: 2.8 mg/dL (ref 2.3–4.6)

## 2012-10-30 LAB — POCT GLYCOSYLATED HEMOGLOBIN (HGB A1C): Hemoglobin A1C: 11.5

## 2012-10-30 MED ORDER — INSULIN GLARGINE 100 UNIT/ML ~~LOC~~ SOLN: 45.0000 [IU] | Freq: Every day | SUBCUTANEOUS | Status: DC

## 2012-10-30 MED ORDER — CICLOPIROX & LACQUER REMOVAL 8 % EX KIT: 1.0000 "application " | PACK | Freq: Every day | CUTANEOUS | Status: DC

## 2012-10-30 NOTE — Assessment & Plan Note (Signed)
Will check CBC, CMET, prograf level, and phosphorus for monitoring on anti-rejection medications.

## 2012-10-30 NOTE — Assessment & Plan Note (Addendum)
Patient's blood sugar report and A1C not correlating.  Unclear if this is due to inaccurate meter, patient non-compliance, or day-time blood sugar elevation.  Patient will meet with Health Coach for [facility] evaluation due to uncontrolled DM.

## 2012-10-30 NOTE — Progress Notes (Signed)
  Subjective:    Patient ID: Adam Davis, male    DOB: 1976/03/06, 37 y.o.   MRN: [id number]  HPI:  Mr. Orth comes in for follow up.  He reports no major problems.   DM- taking Lantus 40 units qhs.  He says he checks his sugars once daily in the morning, and fasting sugars range from 120-150.  He reports feeling hypoglycemic a few times a month, and when he checks it it is about 75.  Denies polyuria, polydipsia.  He says his glucometer is several years old.   Kidney transplant- seeing Dr. Lacy Duverney at [facility].  Needs labs done.  She has decreased his metoprolol still taking cellecpt and prograf  HTN- taking metoprolol 25 daily.  No palpitations, chest pain, LE edema.  Still getting exercise about 5 days a week.   Past Medical History  Diagnosis Date  . Diabetes mellitus   . H/O acute post-streptococcal glomerulonephritis 1997  . Tachycardia   . Hyperlipidemia   . Hypertension     History  Substance Use Topics  . Smoking status: Never Smoker   . Smokeless tobacco: Not on file  . Alcohol Use: No    Family History  Problem Relation Age of Onset  . Diabetes Brother   . Diabetes Maternal Grandmother   . Hypertension Maternal Grandmother      ROS Pertinent items in HPI    Objective:  Physical Exam:  BP 130/90  Pulse 80  Temp(Src) 97.6 F (36.4 C) (Oral)  Ht 5\' 5"  (1.651 m)  Wt 167 lb 11.2 oz (76.068 kg)  BMI 27.91 kg/m2 General appearance: alert, cooperative and no distress Head: Normocephalic, without obvious abnormality, atraumatic Lungs: clear to auscultation bilaterally Heart: regular rate and rhythm, S1, S2 normal, no murmur, click, rub or gallop Pulses: 2+ and symmetric       Assessment & Plan:

## 2012-10-30 NOTE — Assessment & Plan Note (Signed)
BP well controlled on decreased dose of metoprolol.

## 2012-10-30 NOTE — Patient Instructions (Addendum)
It was good to see you today.  Your Hemoglobin A1C is  Lab Results  Component Value Date   HGBA1C 11.5 10/30/2012  .  Remember your goal for A1C is less than 7.  Your goal for fasting morning blood sugar is 80-120.   Please increase your lantus to 45 units daily Please get a new meter at the [facility] and check your blood sugar three times a day.  Make an appointment to see me in 1 month.   General Instructions: Old fashion oatmeal not instant G2 gatoraid Try cutting back on bread, cereal, rice, potatoes, pasta     Treatment Goals:  Goals (1 Years of Data) as of 10/30/12   None      Progress Toward Treatment Goals:  Treatment Goal 10/30/2012  Hemoglobin A1C unchanged    Self Care Goals & Plans:  Self Care Goal 10/30/2012  Manage my medications take my medicines as prescribed  Monitor my health keep track of my blood glucose  Eat healthy foods drink diet soda or water instead of juice or soda; eat smaller portions       Care Management & Community Referrals:  Referral 10/30/2012  Referrals made for care management support health educator; other (see comment)

## 2012-10-31 LAB — CBC
MCH: 25.3 pg — ABNORMAL LOW (ref 26.0–34.0)
MCHC: 34 g/dL (ref 30.0–36.0)
Platelets: 133 10*3/uL — ABNORMAL LOW (ref 150–400)
RDW: 14.4 % (ref 11.5–15.5)

## 2012-11-11 ENCOUNTER — Telehealth: Payer: Self-pay | Admitting: Home Health Services

## 2012-11-11 NOTE — Telephone Encounter (Signed)
Spoke with [facility].  Pt reports taking medications as prescribed without missing any days.  Pt reported fasting blood sugar today of 105.  Pt reports feeling well and reports cutting back on carbohydrates in his diet.  Pt reports working out several times a week.  Pt set goal to continue with diet modification and to continue exercising.  Pt's overall goal is dm management.

## 2012-11-13 ENCOUNTER — Encounter: Payer: Self-pay | Admitting: *Deleted

## 2012-12-04 ENCOUNTER — Telehealth: Payer: Self-pay | Admitting: Home Health Services

## 2012-12-04 NOTE — Telephone Encounter (Signed)
Spoke with Adam Davis Pt reports feeling: excellent Pt reports taking medications yes Patient missed taking medications 0 days this week.   Pt is self monitoring their DM:yes medication compliance: compliant all of the time, diabetic diet compliance: compliant most of the time, home glucose monitoring: is performed regularly, fasting values range 130  Pt is self monitoring their HTN:not applicable  Last weeks goals:exercise 3-4 times, focus on diabetic diet (is packing lunches now vs eating out) Pt was successful with last week's goals: yes This weeks goals: exercise 3-4 times, focus on diabetic diet- we talked about types of carbohydrates that raise BG.  Pt believes he might need more intensive nutrition counseling for DM. We dicussed [facility] dietician.  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  Has completed healthcare.gov application. Pt's overall goal is: dm management

## 2012-12-19 ENCOUNTER — Telehealth: Payer: Self-pay | Admitting: Home Health Services

## 2012-12-19 NOTE — Telephone Encounter (Signed)
Spoke with Adam Davis Pt reports feeling: good Pt reports taking medications yes Patient missed taking medications 0 days this week.   Pt is self monitoring their DM:yes medication compliance: compliant all of the time, diabetic diet compliance: compliant most of the time, home glucose monitoring: is performed regularly, fasting values range 140, 115  Feels shaking when BG is at 115- eats little debbie or orange juice he feels it low  Last weeks goals:Diabetic diet, exercise 3 times Pt was successful with last week's goals: yes This weeks goals: diabetic diet, exercise 3 times, monitor bg values Pt's overall goal is: DM management.

## 2013-01-30 ENCOUNTER — Telehealth: Payer: Self-pay | Admitting: Home Health Services

## 2013-01-30 NOTE — Telephone Encounter (Signed)
Spoke with Adam Davis Pt reports feeling: good   Pt is self monitoring their DM:yes medication compliance: compliant all of the time, diabetic diet compliance: compliant all of the time, home glucose monitoring: is performed regularly, values are usually normal, fasting values range 125-140   Last weeks goals:Exercise 3-4 times, diabetic diet Pt was successful with last week's goals: yes This weeks goals: same as last  And to finish applying for health insurance through healthcare.gov Pt's overall goal is: dm management.

## 2013-03-13 ENCOUNTER — Ambulatory Visit: Payer: Self-pay | Admitting: Family Medicine

## 2013-03-25 ENCOUNTER — Ambulatory Visit: Payer: Self-pay | Admitting: Family Medicine

## 2013-04-02 ENCOUNTER — Encounter: Payer: Self-pay | Admitting: Family Medicine

## 2013-04-02 ENCOUNTER — Ambulatory Visit (INDEPENDENT_AMBULATORY_CARE_PROVIDER_SITE_OTHER): Payer: Self-pay | Admitting: Family Medicine

## 2013-04-02 VITALS — BP 130/83 | HR 78 | Temp 98.1°F | Wt 175.0 lb

## 2013-04-02 DIAGNOSIS — Z940 Kidney transplant status: Secondary | ICD-10-CM

## 2013-04-02 DIAGNOSIS — E139 Other specified diabetes mellitus without complications: Secondary | ICD-10-CM

## 2013-04-02 DIAGNOSIS — I10 Essential (primary) hypertension: Secondary | ICD-10-CM

## 2013-04-02 LAB — RENAL FUNCTION PANEL
Albumin: 4 g/dL (ref 3.5–5.2)
BUN: 23 mg/dL (ref 6–23)
CO2: 27 mEq/L (ref 19–32)
Calcium: 9.5 mg/dL (ref 8.4–10.5)
Glucose, Bld: 245 mg/dL — ABNORMAL HIGH (ref 70–99)
Potassium: 4.1 mEq/L (ref 3.5–5.3)
Sodium: 136 mEq/L (ref 135–145)

## 2013-04-02 LAB — POCT URINALYSIS DIPSTICK
Bilirubin, UA: NEGATIVE
Blood, UA: NEGATIVE
Glucose, UA: 100
Leukocytes, UA: NEGATIVE
Nitrite, UA: NEGATIVE
Urobilinogen, UA: 0.2
pH, UA: 6

## 2013-04-02 LAB — CBC
HCT: 43.2 % (ref 39.0–52.0)
Hemoglobin: 14.5 g/dL (ref 13.0–17.0)
MCH: 25.1 pg — ABNORMAL LOW (ref 26.0–34.0)
MCHC: 33.6 g/dL (ref 30.0–36.0)
RBC: 5.77 MIL/uL (ref 4.22–5.81)

## 2013-04-02 NOTE — Patient Instructions (Signed)
Nice to meet you. You are doing a great job.  We will get several labs from you today. We will let you know what the results are when they come back. I would stop taking the work out supplement with nitric oxide given your history of kidney transplant.

## 2013-04-02 NOTE — Progress Notes (Signed)
  Subjective:    Patient ID: Adam Davis, male    DOB: September 18, 1975, 37 y.o.   MRN: [id number]  HPI HYPERTENSION Disease Monitoring: checks intermittently at [employer] Chest pain, palpitations- no      Dyspnea- no Medications: metoprolol Compliance- taking  Edema- no Has been exercising and eating better.  DIABETES Disease Monitoring: checking at home Blood Sugar ranges-120-140        Medications: lantus Compliance- taking Hypoglycemic symptoms- no  S/p renal transplant: saw nephrologist about a month ago. States they said everything was ok. Has list of labs to be ordered and faxed to [facility]. Also has question about nitrous oxide work out powder and whether or not this would be ok for him to use.  PMH Smoking Status noted   Review of Systems see HPI     Objective:   Physical Exam  Constitutional: He appears well-developed and well-nourished.  HENT:  Head: Normocephalic and atraumatic.  Cardiovascular: Normal rate, regular rhythm and normal heart sounds.   Pulmonary/Chest: Effort normal.  Musculoskeletal: He exhibits no edema.  Neurological: He is alert.  Skin: Skin is warm and dry.  BP 130/83  Pulse 78  Temp(Src) 98.1 F (36.7 C) (Oral)  Wt 175 lb (79.379 kg)  BMI 29.12 kg/m2     Assessment & Plan:

## 2013-04-03 NOTE — Assessment & Plan Note (Signed)
A1c improved to 8.6. To continue diet, exercise, and lantus.

## 2013-04-03 NOTE — Assessment & Plan Note (Signed)
Stable. CBC, renal panel, prograf trough, hgb A1c, UA, and fasting lipid panel ordered. Patient not fasting on day of appointment so will return for this particular lab.

## 2013-04-03 NOTE — Assessment & Plan Note (Signed)
At goal. Continue current treatment.

## 2013-07-16 ENCOUNTER — Telehealth: Payer: Self-pay | Admitting: Family Medicine

## 2013-07-16 MED ORDER — INSULIN GLARGINE 100 UNIT/ML ~~LOC~~ SOLN: 40.0000 [IU] | Freq: Every day | SUBCUTANEOUS | Status: DC

## 2013-07-16 NOTE — Telephone Encounter (Signed)
Refilled medication. Please call patient and inform he needs a follow-up appointment.

## 2013-07-17 NOTE — Telephone Encounter (Signed)
LVM for pt that rx was refilled and that he needs to call back and schedule an appt. Adam Davis,CMA

## 2013-08-13 ENCOUNTER — Telehealth: Payer: Self-pay | Admitting: Family Medicine

## 2013-08-13 MED ORDER — INSULIN GLARGINE 100 UNIT/ML ~~LOC~~ SOLN: 40.0000 [IU] | Freq: Every day | SUBCUTANEOUS | Status: DC

## 2013-08-13 NOTE — Telephone Encounter (Signed)
Tried to [facility] for pt to call and schedule follow up appt.  Unable to due to full mailbox.  Jovanka Westgate,CMA

## 2013-08-13 NOTE — Telephone Encounter (Signed)
Please inform the patient he needs to come in for a follow-up appointment for his diabetes. I have given him one refill. Thanks.

## 2013-10-08 ENCOUNTER — Encounter: Payer: Self-pay | Admitting: Family Medicine

## 2013-10-08 ENCOUNTER — Ambulatory Visit (INDEPENDENT_AMBULATORY_CARE_PROVIDER_SITE_OTHER): Payer: BC Managed Care – PPO | Admitting: Family Medicine

## 2013-10-08 VITALS — BP 118/84 | HR 68 | Temp 97.9°F | Wt 168.0 lb

## 2013-10-08 DIAGNOSIS — IMO0001 Reserved for inherently not codable concepts without codable children: Secondary | ICD-10-CM

## 2013-10-08 DIAGNOSIS — B351 Tinea unguium: Secondary | ICD-10-CM

## 2013-10-08 DIAGNOSIS — Z940 Kidney transplant status: Secondary | ICD-10-CM

## 2013-10-08 DIAGNOSIS — E1165 Type 2 diabetes mellitus with hyperglycemia: Secondary | ICD-10-CM

## 2013-10-08 DIAGNOSIS — IMO0002 Reserved for concepts with insufficient information to code with codable children: Secondary | ICD-10-CM

## 2013-10-08 LAB — POCT GLYCOSYLATED HEMOGLOBIN (HGB A1C): HEMOGLOBIN A1C: 8.2

## 2013-10-08 MED ORDER — TERBINAFINE HCL 250 MG PO TABS: 500.0000 mg | ORAL_TABLET | Freq: Every day | ORAL | Status: DC

## 2013-10-08 MED ORDER — TACROLIMUS 1 MG PO CAPS: 1.0000 mg | ORAL_CAPSULE | Freq: Two times a day (BID) | ORAL | Status: DC

## 2013-10-08 MED ORDER — INSULIN GLARGINE 100 UNIT/ML ~~LOC~~ SOLN: 44.0000 [IU] | Freq: Every day | SUBCUTANEOUS | Status: DC

## 2013-10-08 NOTE — Progress Notes (Signed)
Patient ID: Adam Davis, male   DOB: 07-01-1976, 38 y.o.   MRN: [id number]  Tommi Rumps, MD Phone: [phone]  Adam Davis is a 38 y.o. male who presents today for f/u dm and for onychomycosis.  DIABETES Disease Monitoring: Blood Sugar ranges-110-130 fasting Polyuria/phagia/dipsia- no      Visual problems- has cataracts, in the process of getting this taken care of. Saw optho 6 months ago Medications: Compliance- taking Hypoglycemic symptoms- some after works out, eat something and its ok  Asking about assistance for prograf. In process of obtaining insurance. Was told he would be able get assistance with this once he gets insurance.  Patient also notes that his toe nails have become thick. He has tried topical treatment for this without improvement. They are yellow and thick. They have been this way >1 month.  The following portions of the patient's history were reviewed and updated as appropriate: allergies, current medications, past medical history, family and social history, and problem list.  Patient is a nonsmoker.  Past Medical History  Diagnosis Date  . Diabetes mellitus   . H/O acute post-streptococcal glomerulonephritis 1997  . Tachycardia   . Hyperlipidemia   . Hypertension   . History of renal transplant   . Onychomycosis of toenail     History  Smoking status  . Never Smoker   Smokeless tobacco  . Not on file    Family History  Problem Relation Age of Onset  . Diabetes Brother   . Diabetes Maternal Grandmother   . Hypertension Maternal Grandmother     Current Outpatient Prescriptions on File Prior to Visit  Medication Sig Dispense Refill  . Ciclopirox & Lacquer Removal 8 % KIT Apply 1 application topically daily. Apply evenly over nail and surrounding skin at bedtime (or allow 8 hours before washing); apply daily over previous coat for 7 days; after 7 days, may remove with alcohol and continue cycle.  1 kit  2  . insulin glargine (LANTUS) 100  UNIT/ML injection Inject 0.4 mLs (40 Units total) into the skin at bedtime.  10 mL  0  . metoprolol succinate (TOPROL-XL) 50 MG 24 hr tablet Take 25 mg by mouth daily. Take with or immediately following a meal.      . mycophenolate (CELLCEPT) 250 MG capsule Take by mouth 2 (two) times daily.      . tacrolimus (PROGRAF) 1 MG capsule Take 1 mg by mouth 2 (two) times daily.       No current facility-administered medications on file prior to visit.    ROS: Per HPI   Physical Exam Filed Vitals:   10/08/13 1403  BP: 118/84  Pulse: 68  Temp: 97.9 F (36.6 C)    Physical Examination: General appearance - alert, well appearing, and in no distress Chest - clear to auscultation, no wheezes, rales or rhonchi, symmetric air entry Heart - normal rate, regular rhythm, normal S1, S2, no murmurs, rubs, clicks or gallops Extremities - no pedal edema noted Skin - normal coloration and turgor, no rashes, no suspicious skin lesions noted Microfilament test negative. Bilateral thickening and yellowing of the toe nails.   Assessment/Plan: Please see individual problem list.

## 2013-10-08 NOTE — Patient Instructions (Signed)
Nice to see you. We are going to go up on your lantus to 44 units each night. If you feel like your blood sugar is low please check it and let us know. Please continue to check your sugar each morning prior to eating. Please take the lamisil daily as prescribed for your toe nails.

## 2013-10-09 ENCOUNTER — Encounter: Payer: Self-pay | Admitting: Family Medicine

## 2013-10-09 NOTE — Assessment & Plan Note (Signed)
Will treat with a course of lamisil 500 mg daily for 7 days. Will repeat the first week of every month.

## 2013-10-09 NOTE — Assessment & Plan Note (Signed)
A1c not at goal. Will increase lantus to 44 units daily. Patient to keep log of cbgs fasting and bring to next visit.

## 2013-10-09 NOTE — Assessment & Plan Note (Signed)
Refill given for prograf. Advised to get refills of this from his nephrologist in the future.

## 2013-12-06 ENCOUNTER — Emergency Department (HOSPITAL_COMMUNITY)
Admission: EM | Admit: 2013-12-06 | Discharge: 2013-12-06 | Disposition: A | Discharge status: Home / Self Care | Payer: BC Managed Care – PPO | Attending: Emergency Medicine | Admitting: Emergency Medicine

## 2013-12-06 ENCOUNTER — Encounter (HOSPITAL_COMMUNITY): Payer: Self-pay | Admitting: Emergency Medicine

## 2013-12-06 DIAGNOSIS — Z940 Kidney transplant status: Secondary | ICD-10-CM | POA: Insufficient documentation

## 2013-12-06 DIAGNOSIS — E119 Type 2 diabetes mellitus without complications: Secondary | ICD-10-CM | POA: Insufficient documentation

## 2013-12-06 DIAGNOSIS — Z87448 Personal history of other diseases of urinary system: Secondary | ICD-10-CM | POA: Insufficient documentation

## 2013-12-06 DIAGNOSIS — Z794 Long term (current) use of insulin: Secondary | ICD-10-CM | POA: Insufficient documentation

## 2013-12-06 DIAGNOSIS — Z79899 Other long term (current) drug therapy: Secondary | ICD-10-CM | POA: Insufficient documentation

## 2013-12-06 DIAGNOSIS — K047 Periapical abscess without sinus: Secondary | ICD-10-CM | POA: Insufficient documentation

## 2013-12-06 DIAGNOSIS — E785 Hyperlipidemia, unspecified: Secondary | ICD-10-CM | POA: Insufficient documentation

## 2013-12-06 DIAGNOSIS — Z8619 Personal history of other infectious and parasitic diseases: Secondary | ICD-10-CM | POA: Insufficient documentation

## 2013-12-06 DIAGNOSIS — I10 Essential (primary) hypertension: Secondary | ICD-10-CM | POA: Insufficient documentation

## 2013-12-06 MED ORDER — PENICILLIN V POTASSIUM 250 MG PO TABS
500.0000 mg | ORAL_TABLET | Freq: Once | ORAL | Status: AC
  Administered 2013-12-06: 500 mg via ORAL
  Filled 2013-12-06: qty 2

## 2013-12-06 MED ORDER — HYDROCODONE-ACETAMINOPHEN 5-325 MG PO TABS: 1.0000 | ORAL_TABLET | ORAL | Status: DC | PRN

## 2013-12-06 MED ORDER — PENICILLIN V POTASSIUM 500 MG PO TABS: 500.0000 mg | ORAL_TABLET | Freq: Four times a day (QID) | ORAL | Status: AC

## 2013-12-06 NOTE — ED Notes (Signed)
Dammen, PA at bedside.  

## 2013-12-06 NOTE — ED Notes (Addendum)
C/o upper L back toothache, admits to some swelling. (denies: nvd, fever or drainage), speech clear. Rates pain 10/10. Reports, "CBGs have been OK".  "Took advil around 2200 with some relief". H/o renal transplant.

## 2013-12-06 NOTE — ED Provider Notes (Signed)
CSN: [id number]     Arrival date & time 12/06/13  0346 History   First MD Initiated Contact with Patient 12/06/13 0406     Chief Complaint  Patient presents with  . Dental Pain    HPI  History provided by the patient. Patient is a 38 year old male with history of hypertension, hyperlipidemia, diabetes and kidney transplant who presents with complaints of worsening left upper dental pain. Patient reports having a bad molar tooth that was broken for the past several months. He has not had any problems until the last few days when he is having increasing pain and swelling to the gum around his tooth. He has not used any medicines or treatment for symptoms. Denies any associated fever, chills or sweats. No other aggravating or alleviating factors. No other associated symptoms. Patient to check his blood pressure arrival and reports that it was 109.   Past Medical History  Diagnosis Date  . Diabetes mellitus   . H/O acute post-streptococcal glomerulonephritis 1997  . Tachycardia   . Hyperlipidemia   . Hypertension   . History of renal transplant   . Onychomycosis of toenail    Past Surgical History  Procedure Laterality Date  . Kidney surgery  2006  . Ankle surgery    . Av fistula placement  1997    Removed after transplant in 2006   Family History  Problem Relation Age of Onset  . Diabetes Brother   . Diabetes Maternal Grandmother   . Hypertension Maternal Grandmother    History  Substance Use Topics  . Smoking status: Never Smoker   . Smokeless tobacco: Not on file  . Alcohol Use: No    Review of Systems  Constitutional: Negative for fever and chills.  HENT: Positive for dental problem.   All other systems reviewed and are negative.     Allergies  Review of patient's allergies indicates no known allergies.  Home Medications   Current Outpatient Rx  Name  Route  Sig  Dispense  Refill  . insulin glargine (LANTUS) 100 UNIT/ML injection   Subcutaneous   Inject 0.44  mLs (44 Units total) into the skin at bedtime.   3 vial   1   . metoprolol succinate (TOPROL-XL) 50 MG 24 hr tablet   Oral   Take 50 mg by mouth daily. Take with or immediately following a meal.         . mycophenolate (CELLCEPT) 250 MG capsule   Oral   Take by mouth 2 (two) times daily.         . tacrolimus (PROGRAF) 1 MG capsule   Oral   Take 1 capsule (1 mg total) by mouth 2 (two) times daily.   60 capsule   0    There were no vitals taken for this visit. Physical Exam  Nursing note and vitals reviewed. Constitutional: He is oriented to person, place, and time. He appears well-developed and well-nourished.  HENT:  Head: Normocephalic.  Mouth/Throat:    There is decay of the left upper first molar tooth. There is a 1 cm nodular area of swelling to the medial come in towards the roof of the mouth. Area is fluctuant and tender to palpation. No bleeding or drainage.  Neck: Normal range of motion. Neck supple.  Cardiovascular: Normal rate and regular rhythm.   Pulmonary/Chest: Effort normal and breath sounds normal. No respiratory distress. He has no wheezes.  Abdominal: Soft.  Lymphadenopathy:    He has no cervical adenopathy.  Neurological: He is alert and oriented to person, place, and time.  Skin: Skin is warm.  Psychiatric: He has a normal mood and affect. His behavior is normal.    ED Course  Procedures   COORDINATION OF CARE:  Nursing notes reviewed. Vital signs reviewed. Initial pt interview and examination performed.   Filed Vitals:   12/06/13 0456  BP: 147/97  Pulse: 60  Temp: 97.7 F (36.5 C)  TempSrc: Oral  SpO2: 98%    4:22 AM-patient seen and evaluated. Patient appears well in no acute distress. Discussed my recommendations for I&D of his dental abscess however he has refused the procedure at this time. I will give prescriptions for antibiotics and pain medicine as well as dental referral. Strict return precautions given.  Treatment plan  initiated: Medications  penicillin v potassium (VEETID) tablet 500 mg (not administered)        MDM   Final diagnoses:  Dental abscess       Angus Sellereter S Lasaundra Riche, PA-C 12/06/13 [phone]

## 2013-12-06 NOTE — Discharge Instructions (Signed)
Your providers offered to perform drainage of urine infection however you did not wish to have this performed today in the emergency department. Please followup with the dentist as discussed for continued evaluation and treatment. Take antibiotics as prescribed. Return for any changing or worsening symptoms.     Dental Abscess A dental abscess is a collection of infected fluid (pus) from a bacterial infection in the inner part of the tooth (pulp). It usually occurs at the end of the tooth's root.  CAUSES   Severe tooth decay.  Trauma to the tooth that allows bacteria to enter into the pulp, such as a broken or chipped tooth. SYMPTOMS   Severe pain in and around the infected tooth.  Swelling and redness around the abscessed tooth or in the mouth or face.  Tenderness.  Pus drainage.  Bad breath.  Bitter taste in the mouth.  Difficulty swallowing.  Difficulty opening the mouth.  Nausea.  Vomiting.  Chills.  Swollen neck glands. DIAGNOSIS   A medical and dental history will be taken.  An examination will be performed by tapping on the abscessed tooth.  X-rays may be taken of the tooth to identify the abscess. TREATMENT The goal of treatment is to eliminate the infection. You may be prescribed antibiotic medicine to stop the infection from spreading. A root canal may be performed to save the tooth. If the tooth cannot be saved, it may be pulled (extracted) and the abscess may be drained.  HOME CARE INSTRUCTIONS  Only take over-the-counter or prescription medicines for pain, fever, or discomfort as directed by your caregiver.  Rinse your mouth (gargle) often with salt water ( tsp salt in 8 oz [250 ml] of warm water) to relieve pain or swelling.  Do not drive after taking pain medicine (narcotics).  Do not apply heat to the outside of your face.  Return to your dentist for further treatment as directed. SEEK MEDICAL CARE IF:  Your pain is not helped by  medicine.  Your pain is getting worse instead of better. SEEK IMMEDIATE MEDICAL CARE IF:  You have a fever or persistent symptoms for more than 2 3 days.  You have a fever and your symptoms suddenly get worse.  You have chills or a very bad headache.  You have problems breathing or swallowing.  You have trouble opening your mouth.  You have swelling in the neck or around the eye. Document Released: 08/14/2005 Document Revised: 05/08/2012 Document Reviewed: 11/22/2010 [facility] Patient Information 2014 [place], [place].

## 2013-12-08 NOTE — ED Provider Notes (Signed)
Medical screening examination/treatment/procedure(s) were performed by non-physician practitioner and as supervising physician I was immediately available for consultation/collaboration.   EKG Interpretation None        Brandt LoosenJulie Joseline Mccampbell, MD 12/08/13 [phone]

## 2014-02-18 ENCOUNTER — Telehealth: Payer: Self-pay | Admitting: Family Medicine

## 2014-02-18 NOTE — Telephone Encounter (Signed)
Called Pt about DM care. When Pt calls back, please schedule appointment to check BP, LDL, and A1C.

## 2014-03-12 ENCOUNTER — Ambulatory Visit: Payer: BC Managed Care – PPO | Admitting: Family Medicine

## 2014-03-24 ENCOUNTER — Ambulatory Visit: Payer: BC Managed Care – PPO | Admitting: Family Medicine

## 2014-03-24 ENCOUNTER — Telehealth: Payer: Self-pay | Admitting: *Deleted

## 2014-03-24 DIAGNOSIS — E1165 Type 2 diabetes mellitus with hyperglycemia: Secondary | ICD-10-CM

## 2014-03-24 DIAGNOSIS — IMO0002 Reserved for concepts with insufficient information to code with codable children: Secondary | ICD-10-CM

## 2014-03-24 MED ORDER — INSULIN GLARGINE 100 UNIT/ML ~~LOC~~ SOLN: 44.0000 [IU] | Freq: Every day | SUBCUTANEOUS | Status: DC

## 2014-03-24 NOTE — Telephone Encounter (Signed)
Refill sent in to the pharmacy. Please inform the patient he should come in for an appointment some time in the next month. Thanks.

## 2014-03-30 ENCOUNTER — Encounter: Payer: Self-pay | Admitting: Family Medicine

## 2014-03-30 ENCOUNTER — Ambulatory Visit (INDEPENDENT_AMBULATORY_CARE_PROVIDER_SITE_OTHER): Payer: Self-pay | Admitting: Family Medicine

## 2014-03-30 VITALS — BP 137/84 | HR 69 | Temp 98.3°F | Ht 66.0 in | Wt 168.0 lb

## 2014-03-30 DIAGNOSIS — Z940 Kidney transplant status: Secondary | ICD-10-CM

## 2014-03-30 DIAGNOSIS — E139 Other specified diabetes mellitus without complications: Secondary | ICD-10-CM

## 2014-03-30 DIAGNOSIS — E785 Hyperlipidemia, unspecified: Secondary | ICD-10-CM

## 2014-03-30 DIAGNOSIS — B351 Tinea unguium: Secondary | ICD-10-CM

## 2014-03-30 DIAGNOSIS — I10 Essential (primary) hypertension: Secondary | ICD-10-CM

## 2014-03-30 NOTE — Patient Instructions (Signed)
Nice to see you. Please work on Education officer, communitygetting insurance set up. Once you do this please let us know. Then we can get your blood work and medicine for your toenails. We will also check your A1c at that time as well.

## 2014-03-30 NOTE — Assessment & Plan Note (Signed)
Patient reports normal ranges fasting blood sugars. He does not have insurance at this time. He states he is going to get this in the next few weeks. Once he obtains this we will obtain his A1c. Will continue current lantus dosing. F/u in 3 months.

## 2014-03-30 NOTE — Assessment & Plan Note (Signed)
Did not complete treatment. Still has this issue. He does not have insurance at this time. He states he is going to get this in the next few weeks. Once he obtains this we will obtain a cmet and start back on lamisil. F/u once he gets insurance.

## 2014-03-30 NOTE — Assessment & Plan Note (Signed)
Stable on current medication. Will check lipids once gets insurance. Continue current medication. F/u in 3 months.

## 2014-03-30 NOTE — Assessment & Plan Note (Signed)
Is at goal. Will continue current medication regimen. F/u in 3 months.

## 2014-03-30 NOTE — Progress Notes (Signed)
Patient ID: Adam Davis, male   DOB: 12/26/1975, 38 y.o.   MRN: [id number]  [name], MD Phone: [phone]  Adam SproutMarvin T Hepworth is a 38 y.o. male who presents today for f/u.  HYPERTENSION Disease Monitoring: Blood pressure range-not checking Chest pain, palpitations- no      Dyspnea- no Medications: Compliance- taking   Edema- no  DIABETES Disease Monitoring: Blood Sugar ranges-110 fasting, notes on value of 305 last week Polyuria/phagia/dipsia- no      Visual problems- no Medications: Compliance- taking lantus 44 u daily Hypoglycemic symptoms- ~2x/wk in the morning  HYPERLIPIDEMIA Disease Monitoring: See symptoms for Hypertension Medications: Compliance- taking  Muscle aches- no  Onychomycosis: states took one week worth of lamisil and then did not f/u for lab work. Did not take anymore of the lamisil. Notes the one week cleared it up a bit then it came back. Notes discomfort in toe nails. No other issues with this. Would like to restart treatment.   Patient is a nonsmoker.   ROS: Per HPI   Physical Exam Filed Vitals:   03/30/14 1007  BP: 137/84  Pulse: 69  Temp: 98.3 F (36.8 C)    Gen: Well NAD Skin: bilateral toenails with onychomycosis Lungs: CTABL Nl WOB Heart: RRR no MRG Exts: Non edematous BL  LE, warm and well perfused.    Assessment/Plan: Please see individual problem list.  # Healthcare maintenance: discussed optho referral, patient without insurance and is going to get insurance in the next few weeks, will arrange once insurance is in place

## 2014-03-31 NOTE — Telephone Encounter (Signed)
Informed pt the refill was completed and he needs to schedule an appt with PCP in a month.  Pt stated understanding and will call back next month. Clovis PuMartin, Tomoki Lucken L, RN

## 2014-08-11 ENCOUNTER — Other Ambulatory Visit: Payer: Self-pay | Admitting: *Deleted

## 2014-08-11 DIAGNOSIS — IMO0002 Reserved for concepts with insufficient information to code with codable children: Secondary | ICD-10-CM

## 2014-08-11 DIAGNOSIS — E1165 Type 2 diabetes mellitus with hyperglycemia: Secondary | ICD-10-CM

## 2014-08-11 MED ORDER — INSULIN GLARGINE 100 UNIT/ML ~~LOC~~ SOLN
44.0000 [IU] | Freq: Every day | SUBCUTANEOUS | Status: DC
Start: 2014-08-11 — End: 2014-12-31

## 2014-08-11 NOTE — Telephone Encounter (Signed)
Left voice message for pt to call and schedule an appt in the next 1-2 months for A1c check and that refilled for insulin faxed to [facility].  Clovis PuMartin, Tamika L, RN

## 2014-08-11 NOTE — Telephone Encounter (Signed)
Refill given. Patient needs to come in for an A1c in the next 1-2 months.

## 2014-12-31 ENCOUNTER — Other Ambulatory Visit: Payer: Self-pay | Admitting: *Deleted

## 2014-12-31 DIAGNOSIS — E1165 Type 2 diabetes mellitus with hyperglycemia: Secondary | ICD-10-CM

## 2014-12-31 DIAGNOSIS — IMO0002 Reserved for concepts with insufficient information to code with codable children: Secondary | ICD-10-CM

## 2014-12-31 MED ORDER — INSULIN GLARGINE 100 UNIT/ML ~~LOC~~ SOLN
44.0000 [IU] | Freq: Every day | SUBCUTANEOUS | Status: DC
Start: 2014-12-31 — End: 2015-02-26

## 2014-12-31 NOTE — Telephone Encounter (Signed)
Will give refill for 6 weeks. Patient has not been seen since August and was supposed to return for A1c shortly after that, though did not. He will need to come in for follow-up in the 6 weeks for additional refills.

## 2014-12-31 NOTE — Telephone Encounter (Signed)
Letter mailed to patient. Jazmin Hartsell,CMA  

## 2015-02-26 ENCOUNTER — Other Ambulatory Visit: Payer: Self-pay | Admitting: *Deleted

## 2015-02-26 DIAGNOSIS — IMO0002 Reserved for concepts with insufficient information to code with codable children: Secondary | ICD-10-CM

## 2015-02-26 DIAGNOSIS — E1165 Type 2 diabetes mellitus with hyperglycemia: Secondary | ICD-10-CM

## 2015-02-26 MED ORDER — INSULIN GLARGINE 100 UNIT/ML ~~LOC~~ SOLN: 44.0000 [IU] | Freq: Every day | SUBCUTANEOUS | Status: DC

## 2015-03-11 ENCOUNTER — Other Ambulatory Visit: Payer: Self-pay | Admitting: *Deleted

## 2015-03-11 DIAGNOSIS — IMO0002 Reserved for concepts with insufficient information to code with codable children: Secondary | ICD-10-CM

## 2015-03-11 DIAGNOSIS — E1165 Type 2 diabetes mellitus with hyperglycemia: Secondary | ICD-10-CM

## 2015-03-12 NOTE — Telephone Encounter (Signed)
Spoke with Adam Davis this morning regarding his refill request for Lantus 44 units. Patient has not been seen in clinic since August 2015. Patient states he currently has half a vial left.  I explained to patient that I am hesitant to refill another vial as I donot know his blood sugar ranges or if he is having hypoglycemia. Patient states he is not having hypoglycemic symptoms in the mornings. I asked patient if he will call the clinic today to set up an appointment in the next few days before he runs out of his Lantus. Patient agreed. Will check in the afternoon if he scheduled an appointment. If not, will call again.

## 2015-03-17 ENCOUNTER — Ambulatory Visit: Payer: Self-pay | Admitting: Internal Medicine

## 2015-03-30 ENCOUNTER — Other Ambulatory Visit: Payer: Self-pay | Admitting: *Deleted

## 2015-03-30 DIAGNOSIS — E1165 Type 2 diabetes mellitus with hyperglycemia: Secondary | ICD-10-CM

## 2015-03-30 DIAGNOSIS — IMO0002 Reserved for concepts with insufficient information to code with codable children: Secondary | ICD-10-CM

## 2015-03-30 NOTE — Telephone Encounter (Signed)
Please call patient and have him make an appointment to be seen in clinic. Patient has not been seen in clinic since August 2015. I refilled his lantus in July and at that time, informed him that i will not refill again until he is seen in clinic. I explained the dangers of possible hypoglycemia with reordering this medication without having a physician see him in almost a year; additionally I have never seen him in clinic. He agreed to make an appointment in late July. However he cancelled this appointment the day before it was scheduled. I believe he might have difficulty with paying for the visit as he may not have insurance (per chart review). I wonder if he would be a good candidate for orange card program? Could you please determine if he is eligible for this if he is having financial trouble?

## 2015-03-31 MED ORDER — INSULIN GLARGINE 100 UNIT/ML ~~LOC~~ SOLN: 44.0000 [IU] | Freq: Every day | SUBCUTANEOUS | Status: DC

## 2015-03-31 NOTE — Addendum Note (Signed)
Addended by: Latrelle Dodrill on: 03/31/2015 03:26 PM   Modules accepted: Orders

## 2015-03-31 NOTE — Telephone Encounter (Signed)
I am precepting today and was copied on this message. Reviewed chart. Agree that patient needs to be seen. He has an appointment later this week. I will go ahead and send in a refill of his insulin to last him until this visit.   Blue team, please call patient and let him know this is being sent in but he absolutely MUST keep his appointment this Friday.  Thanks, Latrelle Dodrill, MD

## 2015-03-31 NOTE — Telephone Encounter (Signed)
Called pt and advised him as directed below and verbalized understanding and [name], CMA had called before I did and LM stating info as well. [name], CMA.

## 2015-03-31 NOTE — Telephone Encounter (Signed)
Patient has an appt on Friday 04-02-15 and we can talk to him then to check on his insurance status. Renelda Kilian,CMA

## 2015-03-31 NOTE — Telephone Encounter (Signed)
Will forward message to preceptor also.  Patient called today to check on status of medication.  He has been out of insulin since Monday.  Patient plans to keep his appt on Friday but needs enough meds to last. [facility]

## 2015-04-02 ENCOUNTER — Encounter: Payer: Self-pay | Admitting: Internal Medicine

## 2015-04-02 ENCOUNTER — Ambulatory Visit (INDEPENDENT_AMBULATORY_CARE_PROVIDER_SITE_OTHER): Payer: Self-pay | Admitting: Internal Medicine

## 2015-04-02 VITALS — BP 130/92 | HR 72 | Temp 98.5°F | Wt 168.1 lb

## 2015-04-02 DIAGNOSIS — IMO0002 Reserved for concepts with insufficient information to code with codable children: Secondary | ICD-10-CM

## 2015-04-02 DIAGNOSIS — E1165 Type 2 diabetes mellitus with hyperglycemia: Secondary | ICD-10-CM

## 2015-04-02 DIAGNOSIS — B351 Tinea unguium: Secondary | ICD-10-CM

## 2015-04-02 LAB — POCT GLYCOSYLATED HEMOGLOBIN (HGB A1C): Hemoglobin A1C: 10

## 2015-04-02 MED ORDER — INSULIN GLARGINE 100 UNIT/ML ~~LOC~~ SOLN: 44.0000 [IU] | Freq: Every day | SUBCUTANEOUS | Status: DC

## 2015-04-02 NOTE — Assessment & Plan Note (Signed)
DM is uncontrolled. With elevated A1c at 10 from 8.2 (09/2013). States he is compliant with medication except for the past 3-4 days when he ran out. Notes of hypoglycemic symptoms almost every day, notes highest BS value of 300 3 weeks ago, and reports BS of 109-230 during random CBG checks. Additionally, his hypoglycemic symptoms are in the evenings before he takes his Lantus and a few hours after he eats dinner, which does not quite add up.  His blood sugar ranges that he reports does not quite correspond to his A1c result today. - Although his A1c is elevated today, I am hesitant to increase his Lantus due to the reported hypoglycemic symptoms he has in the evening, and decreasing his dose will increase his BS and worsen control. - I discussed this issue with patient and we agreed to have patient make an appointment with Dr. Raymondo Band in [facility] for Diabetes [facility] - Refilled current Lantus until pharmacy clinic visit.

## 2015-04-02 NOTE — Progress Notes (Signed)
Patient ID: Adam Davis, male   DOB: 08-21-1976, 39 y.o.   MRN: [id number]  Subjective:   CC: DM follow up and toenail fungus   HPI:   DM2:  Patient's current med regiment is Lantus 44 units. States he was out of lantus for 3-4 days, but took dose last night; takes med before bed. Patient gets his Lantus from the health department through MAP program. States he has not missed any doses other than the last 3-4 days. Patient checks BS only if he is feeling "high" or "low". States BS usually run in 109-230. Has hypoglycemic symptoms almost every day that are mainly in the evening when he is working or working out; states he eats dinner around 5-6pm and has hypoglycemic symptoms around 9-10pm). His BS runs 79-80 during these events. Symptoms include sweats, gitters, and lightheadedness. When he has symptoms, he usually drinks orange juice or something sweet until he feels better. States his highest BS reading was 300 which was about 3 weeks ago.   Notes of blurry vision when he was out of lantus for 3-4 days. Denies polyuria, polydipsia, polyphagia.  Patient has not seen eye doctor in 2 years.   Toenail Fungus:  Patient also notes that his toe nails have become yellow/brown and think. Has had symptoms for the past 1.5 years. Was seen in clinic for this and was prescribed Lamisil  daily for 7 days with a plan to follow up for liver function lab before continuing. However, patient did not follow up for labs. Patient states he missed a few appointments and never started back on Lamasil.   Review of Systems - Per HPI. Additionally, no chest pain, SOB, LE swelling.   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 Is taking Prograf and Mycophenolate as prescribed. Per chart review he was last seen in May 2016 where he also had labs done including CBC, BMP, and Lipid panel. TAGs were elevated at 190, HDL 44, and LDL elevated at  119. Per note, nephrologist would like to check again and if high recommends starting medication.   PMH and Medications reviewed     Objective:  Physical Exam BP 130/92 mmHg  Pulse 72  Temp(Src) 98.5 F (36.9 C) (Oral)  Wt 168 lb 1.8 oz (76.254 kg) GEN: NAD CV: RRR, no murmurs, rubs, or gallops PULM: CTAB, normal effort SKIN: No rash or cyanosis; warm and well-perfused. Bilateral toenails with significant onychomycosis--dark brown/yellow, thick, long/curled toe nails .  EXTR: No lower extremity edema or calf tenderness.  PSYCH: Mood and affect euthymic, normal rate and volume of speech NEURO: Awake, alert, no focal deficits grossly, normal speech     Assessment:     OSHAE SIMMERING is a 39 y.o. male with h/o HTN, DM, hx of renal transplant, HLD.     Plan:     # See problem list and after visit summary for problem-specific plans.   Follow-up: Follow up in 3 months for DM    Palma Holter, MD [facility]

## 2015-04-02 NOTE — Patient Instructions (Signed)
Good to see you today! You were seen for diabetes follow up and toenail fungus. Your A1c today was 10, which means your diabetes is not as controlled as we like it to be. I will refill Lantus but as we discussed it is important that you see Dr. Raymondo Band our pharmacist to help with your insulin regimen. I also made a podiatry referral for the toenail fungus. Please make sure you are treating your low sugars as we discussed.  Please make an appointment with Dr. Raymondo Band at the [facility] when you check out.  Also, make an appointment with your eye doctor.

## 2015-04-02 NOTE — Assessment & Plan Note (Signed)
Due to significant onychomycosis of his toenails bilaterally, long and curled toe nails, and with his history of uncontrolled DM, patient agreed to be seen by podiatry for management.  - referral to podiatry placed; patient understands that he will be contacted by podiatry

## 2015-04-09 ENCOUNTER — Telehealth: Payer: Self-pay | Admitting: *Deleted

## 2015-04-09 NOTE — Telephone Encounter (Signed)
-----   Message from Palma Holter, MD sent at 04/06/2015  6:13 PM EDT ----- I saw this patient in clinic on 8/5. Please call him and let him know that he needs to check his blood sugar in the morning before the first meal of the day every day and keep a record of his readings. He should also continue to check his blood sugar levels when he feels like it is low. Ask him to keep track of the time he checked and glucose level. This information is important in helping manage his diabetes. Also, please remind him to make an appointment with the pharmacist at clinic Dr. Raymondo Band for diabetes management; this is very important. He should bring his blood glucose record to this and all future appointments.    Thank you for your help!

## 2015-04-12 NOTE — Telephone Encounter (Signed)
Advised pt as directed below and verbalized understanding. Scheduled appt with Dr. Raymondo Band on 04/22/15 :30am. Ronelle Smallman, CMA.

## 2015-04-22 ENCOUNTER — Ambulatory Visit: Payer: Self-pay | Admitting: Pharmacist

## 2015-06-01 ENCOUNTER — Other Ambulatory Visit: Payer: Self-pay | Admitting: *Deleted

## 2015-06-02 ENCOUNTER — Telehealth: Payer: Self-pay | Admitting: Internal Medicine

## 2015-06-02 MED ORDER — INSULIN GLARGINE 100 UNIT/ML ~~LOC~~ SOLN: 44.0000 [IU] | Freq: Every day | SUBCUTANEOUS | Status: DC

## 2015-06-02 NOTE — Telephone Encounter (Signed)
Spoke with Adam Davis regarding his refill request for Lantus. Per chart review, patient had "no show" to Pharmacy Clinic with Dr. Raymondo Band as scheduled to manage his insulin. I reiterated the importance of this as he previously mentioned that he was having symptoms of hypoglycemia while he is active at work and during exercise. His A1c at the the time of this visit in august was 10%. Due to elevated A1c and reports of hypoglycemia symptoms, we did not make any changes to his Lantus dose in August. He was to keep a log of fasting CBGs daily until his appointment with Dr. Raymondo Band on 8/25.   I spoke with him today to inform him about his refill request. He continues to have symptoms of hypoglycemia about 3 days a week; these episodes occur when he is active at work or when he exercises during the weekend. He continues to check his CBG only when he has these symptoms; he usually gets CBG values around 79-80. He does not skip meals. He has not checked and kept a log of fasting CBGs daily as we discussed in August.   I reiterated the dangers of hypoglycemia that I am concerned about; he states he understands and is aware of how to correct for it by eating or drinking a small snack. He states that he will keep a log of fasting CBGs daily for the next few days and will call the clinic with the values; this is required for [place] to make any changes to his Lantus. He also agrees to call the clinic and make another appointment with Dr. Raymondo Band. I will refill his insulin but I emphasized the importance of following up with the plan noted above.

## 2015-06-17 ENCOUNTER — Telehealth: Payer: Self-pay | Admitting: Internal Medicine

## 2015-06-17 NOTE — Telephone Encounter (Signed)
Adam Davis this is the MAP prescription we talked about jw

## 2015-06-17 NOTE — Telephone Encounter (Signed)
Left message of Pharmacist line for MAP for the Lantus 100 Unit/ML: inject 44 Units into skin at bedtime, 2 refills.  Clovis PuMartin, Tamika L, RN

## 2015-06-17 NOTE — Telephone Encounter (Signed)
Pt called because the [facility] said they didn't receive a fax from us on 10/05. Can we resend this. It is for his Lantus. jw

## 2015-06-17 NOTE — Telephone Encounter (Signed)
Will forward to MD covering for PCP and PCP for review. Hinton Luellen, CMA. 

## 2015-06-21 ENCOUNTER — Ambulatory Visit: Payer: Self-pay | Admitting: Pharmacist

## 2015-06-30 ENCOUNTER — Ambulatory Visit: Payer: Self-pay | Admitting: Internal Medicine

## 2015-07-05 ENCOUNTER — Ambulatory Visit (INDEPENDENT_AMBULATORY_CARE_PROVIDER_SITE_OTHER): Payer: Self-pay | Admitting: Pharmacist

## 2015-07-05 ENCOUNTER — Encounter: Payer: Self-pay | Admitting: Pharmacist

## 2015-07-05 DIAGNOSIS — E1122 Type 2 diabetes mellitus with diabetic chronic kidney disease: Secondary | ICD-10-CM

## 2015-07-05 DIAGNOSIS — E1165 Type 2 diabetes mellitus with hyperglycemia: Secondary | ICD-10-CM

## 2015-07-05 DIAGNOSIS — Z794 Long term (current) use of insulin: Secondary | ICD-10-CM

## 2015-07-05 MED ORDER — INSULIN GLARGINE 100 UNIT/ML ~~LOC~~ SOLN: 30.0000 [IU] | SUBCUTANEOUS | Status: DC

## 2015-07-05 MED ORDER — INSULIN ASPART 100 UNIT/ML ~~LOC~~ SOLN: 10.0000 [IU] | Freq: Every day | SUBCUTANEOUS | Status: DC

## 2015-07-05 NOTE — Patient Instructions (Signed)
Thank you for visiting us today!  We are going to decrease your insulin glargine (Lantus) to 30 units and we would like you to take this in the morning. This will hopefully help prevent the low blood sugars you have been having.   We would like to start a short acting insulin, insulin aspart (Novolog) 10 units with your biggest meal of the day around 04:30PM.  We would like you to check your blood sugar every morning and one additional time a day.  We also spoke about your diet and how to focus on moderation of foods high in carbohydrates. We would suggest following up with a dietician to discuss more in depth your diet.   Please follow up in clinic with Dr. Raymondo BandKoval in 2-3 weeks and please bring your blood sugar readings so we can assess how your new medications are working

## 2015-07-05 NOTE — Assessment & Plan Note (Signed)
Diabetes diagnosed in 2006 currently is uncontrolled based on recent A1c of 10. Pts A1c has fluctuated over the past 5 years ranging from low 8s to 10.   Patient endorses hypoglycemic events and is able to verbalize appropriate hypoglycemia management plan.  Patient reports adherence with medication. Control is suboptimal due to diet and lack of optimization of current medication regimen.  Decrease basal insulin Lantus (insulin glargine) to 30 units daily and start taking this in the AM.  Initiate rapid insulin Novolog (insulin aspart) at 10 units with the largest meal of the day, most usually this meal is at 04:30PM for the patient. Encouraged patient to start checking blood sugar more frequently. Counseled patient to check blood sugar every AM and one additional time daily. Patient was instructed to bring these readings to his next follow up visit. Patient was also counseled on an appropriate diabetes diet, focusing on moderation. Pt was encouraged to follow up with a dietician for more in depth discussion of his diet. Next A1C anticipated December.

## 2015-07-05 NOTE — Progress Notes (Signed)
S:    Patient arrives in good spirits and no distress.  Presents for diabetes follow up. Patient reports diabetes was diagnosed in 2006 s/p kidney transplant and long term prednisone use. Patient reports adherence with medications. Current diabetes medications include insulin glargine 40 units at bedtime. Patient reports hypoglycemic events. Pt works two jobs and works out at [employer] regularly. Pt endorses checking blood glucose apprx 4 times a week and only usually checks when he feels its low. Blood glucose is usually in the 80s when he checks.  Patient reported dietary habits: Pt reports that he does not restrict his diet, eats 3-4 large meals a day  Patient reported exercise habits: Regularly works out at [employer].   Patient denies nocturia.  Patient denies neuropathy. Patient denies visual changes. Patient denies self foot exams.    O:  Lab Results  Component Value Date   HGBA1C 10.0 04/02/2015    Home fasting CBG: 80s 2 hour post-prandial/random CBG: Does not check  A/P: Diabetes diagnosed in 2006 currently is uncontrolled based on recent A1c of 10. Pts A1c has fluctuated over the past 5 years ranging from low 8s to 10.   Patient endorses hypoglycemic events and is able to verbalize appropriate hypoglycemia management plan.  Patient reports adherence with medication. Control is suboptimal due to diet and lack of optimization of current medication regimen.  Decrease basal insulin Lantus (insulin glargine) to 30 units daily and start taking this in the AM.  Initiate rapid insulin Novolog (insulin aspart) at 10 units with the largest meal of the day, most usually this meal is at 04:30PM for the patient. Encouraged patient to start checking blood sugar more frequently. Counseled patient to check blood sugar every AM and one additional time daily. Patient was instructed to bring these readings to his next follow up visit. Patient was also counseled on an appropriate diabetes diet, focusing on  moderation. Pt was encouraged to follow up with a dietician for more in depth discussion of his diet. Next A1C anticipated December.    Written patient instructions provided.  Total time in face to face counseling 45 minutes.   Follow up in Pharmacist Clinic Visit in 2-3 weeks to assess new regimen.   Patient seen with Renata CapriceMeredith Tilley, PharmD Candidate, and Sandi CarneNick Gazda, PharmD Resident.

## 2015-07-06 NOTE — Progress Notes (Signed)
Patient ID: Adam Davis, male   DOB: 02/03/1976, 39 y.o.   MRN: [id number] Reviewed: Agree with Dr. Macky LowerKoval's documentation and management.

## 2015-07-27 ENCOUNTER — Ambulatory Visit: Payer: Self-pay | Admitting: Pharmacist

## 2015-07-28 ENCOUNTER — Encounter: Payer: Self-pay | Admitting: Internal Medicine

## 2015-07-28 ENCOUNTER — Ambulatory Visit (INDEPENDENT_AMBULATORY_CARE_PROVIDER_SITE_OTHER): Payer: Self-pay | Admitting: Internal Medicine

## 2015-07-28 VITALS — BP 139/102 | HR 83 | Temp 98.0°F | Wt 166.1 lb

## 2015-07-28 DIAGNOSIS — I10 Essential (primary) hypertension: Secondary | ICD-10-CM

## 2015-07-28 DIAGNOSIS — Z794 Long term (current) use of insulin: Secondary | ICD-10-CM

## 2015-07-28 DIAGNOSIS — IMO0001 Reserved for inherently not codable concepts without codable children: Secondary | ICD-10-CM

## 2015-07-28 DIAGNOSIS — E1165 Type 2 diabetes mellitus with hyperglycemia: Secondary | ICD-10-CM

## 2015-07-28 NOTE — Assessment & Plan Note (Addendum)
BP elevated today. Patient did not take Metoprolol today. We discussed the benefit of Lisinopril for blood pressure control and kidney protection with his history of diabetes. Patient currently declines as he cannot afford the cost of BMET he will need to monitor kidney function and electrolytes 1 week after starting ACE.  - continue Metoprolol for now - patient is to monitor blood pressure by going to pharmacy. Normal ranges given; patient is to call clinic if blood pressure continues to be elevated while on Metoprolol  - patient is to make a follow up appointment sooner than the next follow up for DM if he decides that he would like to start Lisinopril  - he is to call the clinic tomorrow AM to get more information about applying to the orange card program

## 2015-07-28 NOTE — Assessment & Plan Note (Signed)
No more hypoglycemic episodes with new Insulin regimen.  - cont current regimen: Lantus 30units AM, Novolog 10units with largest meal  - will check A1c in February as new regimen was started this month - patient is to check CBGs every AM and keep log - patient is to get a new glucometer and strips from [facility]/ or other store - patient is to call regarding applying for orange card so that he will be able to afford lab work

## 2015-07-28 NOTE — Progress Notes (Addendum)
Patient ID: Adam Davis, male   DOB: 09-03-1975, 39 y.o.   MRN: [id number] Date of Visit: 07/28/2015   HPI: Mr. Wacha is here for follow up for DM  DM2: - Patient was seen by Dr. Raymondo Band on 11/7 for insulin management for DM. His regimen was changed to the following due to hypoglycemic episodes: Lantus 30units in the AM and Novolog 10units with the biggest meal - Since dose change, patient has not had any hypoglycemic episodes - He is currently not checking CBGs every AM as recommended.  - States his glucometer is not working properly - Denies increased frequency of urination, blurred vision, numbness/tingling of extremities  - Last A1c done in August was 10. Since the regimen was only changed this month, repeat A1c today at clinic will not be beneficial for management of patient's diabetes  - Patient currently is unable to afford lab work such as urine microalbumin, Lipids, BMET as he is uninsured   HTN: - Patient's blood pressure was elevated on presentation at 136/101 and repeat was 139/102.  - States he did not take his Metoprolol today - Denies chest pain, HA, SOB, blurred vision - With further discussion, patient notes that his nephrologist switched his Metoprolol to Amlodipine due to decreased libido " a while ago". However, patient was not able to afford Norvasc, therefore he switched back to Beta Blocker.  - We discussed the benefit of Lisinopril for blood pressure control and kidney protection with his history of diabetes. I also discussed the need to check BMET about a week to monitor Creatinine and Potassium. After further discussion, patient declined to start the Lisinopril because he currently cannot afford the cost of the BMET.  - We discussed how he can apply for the orange card. Patient is to call the clinic tomorrow morning to inquire about orange card application  ROS: See HPI.  PMFSH: never somoker   PHYSICAL EXAM: BP 139/102 mmHg  Pulse 83  Temp(Src) 98 F (36.7  C) (Oral)  Wt 166 lb 1.6 oz (75.342 kg) GEN: NAD CV: RRR, no murmurs, rubs, or gallops PULM: CTAB, normal effort ABD: Soft, nontender, nondistended, NABS, no organomegaly SKIN: No rash or cyanosis; warm and well-perfused EXTR: No lower extremity edema or calf tenderness PSYCH: Mood and affect euthymic, normal rate and volume of speech NEURO: Awake, alert, no focal deficits grossly, normal speech  ASSESSMENT/PLAN:  Essential hypertension, benign BP elevated today. Patient did not take Metoprolol today. We discussed the benefit of Lisinopril for blood pressure control and kidney protection with his history of diabetes. Patient currently declines as he cannot afford the cost of BMET he will need to monitor kidney function and electrolytes 1 week after starting ACE.  - continue Metoprolol for now - patient is to monitor blood pressure by going to pharmacy. Normal ranges given; patient is to call clinic if blood pressure continues to be elevated while on Metoprolol  - patient is to make a follow up appointment sooner than the next follow up for DM if he decides that he would like to start Lisinopril  - he is to call the clinic tomorrow AM to get more information about applying to the orange card program   DM (diabetes mellitus), type 2, uncontrolled No more hypoglycemic episodes with new Insulin regimen.  - cont current regimen: Lantus 30units AM, Novolog 10units with largest meal  - will check A1c in February as new regimen was started this month - patient is to check CBGs every AM  and keep log - patient is to get a new glucometer and strips from [facility]/ or other store - patient is to call regarding applying for orange card so that he will be able to afford lab work    - We discussed how he can apply for the orange card. Patient is to call the clinic tomorrow morning to inquire about orange card application.   - declined Influenza vaccine; declined PNA vaccine   FOLLOW UP: F/u in  3 months for DM; follow up earlier if patient decides to start Lisinopril   Palma HolterKanishka G Audre Cenci, MD [facility]

## 2015-07-28 NOTE — Patient Instructions (Signed)
-   For the [facility], please call the clinic to discuss about applying for the orange card - [facility] and strips to check your sugars - Please check your blood sugar every morning before eating and keep a log - please check your blood pressure at the pharmacy about every other day. Please call us if you continue to have high blood pressure despite taking the Metoprolol (blood pressure greater than 140 for the top number or greater than 90 for the bottom number

## 2015-09-14 ENCOUNTER — Encounter (HOSPITAL_COMMUNITY): Payer: Self-pay

## 2015-09-14 ENCOUNTER — Emergency Department (HOSPITAL_COMMUNITY)
Admission: EM | Admit: 2015-09-14 | Discharge: 2015-09-14 | Disposition: A | Discharge status: Home / Self Care | Payer: BLUE CROSS/BLUE SHIELD | Attending: Emergency Medicine | Admitting: Emergency Medicine

## 2015-09-14 ENCOUNTER — Emergency Department (HOSPITAL_COMMUNITY): Payer: BLUE CROSS/BLUE SHIELD

## 2015-09-14 DIAGNOSIS — Z794 Long term (current) use of insulin: Secondary | ICD-10-CM | POA: Diagnosis not present

## 2015-09-14 DIAGNOSIS — R05 Cough: Secondary | ICD-10-CM | POA: Insufficient documentation

## 2015-09-14 DIAGNOSIS — Z940 Kidney transplant status: Secondary | ICD-10-CM | POA: Insufficient documentation

## 2015-09-14 DIAGNOSIS — R52 Pain, unspecified: Secondary | ICD-10-CM | POA: Insufficient documentation

## 2015-09-14 DIAGNOSIS — R509 Fever, unspecified: Secondary | ICD-10-CM | POA: Diagnosis not present

## 2015-09-14 DIAGNOSIS — M791 Myalgia: Secondary | ICD-10-CM | POA: Insufficient documentation

## 2015-09-14 DIAGNOSIS — I10 Essential (primary) hypertension: Secondary | ICD-10-CM | POA: Insufficient documentation

## 2015-09-14 DIAGNOSIS — J3489 Other specified disorders of nose and nasal sinuses: Secondary | ICD-10-CM | POA: Insufficient documentation

## 2015-09-14 DIAGNOSIS — R6889 Other general symptoms and signs: Secondary | ICD-10-CM

## 2015-09-14 DIAGNOSIS — E119 Type 2 diabetes mellitus without complications: Secondary | ICD-10-CM | POA: Insufficient documentation

## 2015-09-14 DIAGNOSIS — R040 Epistaxis: Secondary | ICD-10-CM | POA: Insufficient documentation

## 2015-09-14 MED ORDER — PHENYLEPH-PROMETHAZINE-COD 5-6.25-10 MG/5ML PO SYRP: 5.0000 mL | ORAL_SOLUTION | Freq: Two times a day (BID) | ORAL | Status: DC | PRN

## 2015-09-14 NOTE — ED Notes (Signed)
Pt reports for the past week he has been feeling bad and describes symptoms of body aches, subjective fever, and he reports he had one nose bleed this morning lasting about 3 minutes after he blew his nose. No active bleeding. Denies N/V/D.

## 2015-09-14 NOTE — ED Notes (Signed)
See pa note.  

## 2015-09-14 NOTE — Discharge Instructions (Signed)
Do not take the cough medication at work or driving as it will make you sleepy. Return as needed for worsening symptoms.

## 2015-09-14 NOTE — ED Provider Notes (Signed)
CSN: [id number]     Arrival date & time 09/14/15  1616 History  By signing my name below, I, Jarvis Morgan, attest that this documentation has been prepared under the direction and in the presence of Kerrie Buffalo, NP Electronically Signed: Jarvis Morgan, ED Scribe. 09/14/2015. 11:47 PM.    Chief Complaint  Patient presents with  . Generalized Body Aches    The history is provided by the patient. No language interpreter was used.    HPI Comments: Adam Davis is a 40 y.o. male with a h/o DM and HTN who presents to the Emergency Department complaining of constant, moderate, generalized body aches for 1 week. He states the symptoms began with associated rhinorrhea and dry cough and have been persistent and unchanged. Pt also notes a subjective fever and epistaxis x1 which have now both subsided. He states he took Theraflu and drank hot tea with mild relief. He did not get his annual flu shot. He reports he is able to keep down fluids. He denies any chest pain, abdominal pain, nausea, vomiting, or other associated symptoms at this time.  Past Medical History  Diagnosis Date  . Diabetes mellitus   . H/O acute post-streptococcal glomerulonephritis 1997  . Tachycardia   . Hyperlipidemia   . Hypertension   . History of renal transplant   . Onychomycosis of toenail    Past Surgical History  Procedure Laterality Date  . Kidney surgery  2006  . Ankle surgery    . Av fistula placement  1997    Removed after transplant in 2006   Family History  Problem Relation Age of Onset  . Diabetes Brother   . Diabetes Maternal Grandmother   . Hypertension Maternal Grandmother    Social History  Substance Use Topics  . Smoking status: Never Smoker   . Smokeless tobacco: None  . Alcohol Use: No    Review of Systems  Constitutional: Positive for fever (subjective).  HENT: Positive for nosebleeds (x1) and rhinorrhea.   Respiratory: Positive for cough.   Gastrointestinal: Negative for nausea  and vomiting.  Musculoskeletal: Positive for myalgias.  All other systems reviewed and are negative.     Allergies  Review of patient's allergies indicates no known allergies.  Home Medications   Prior to Admission medications   Medication Sig Start Date End Date Taking? Authorizing Provider  insulin aspart (NOVOLOG) 100 UNIT/ML injection Inject 10 Units into the skin daily before supper. 07/05/15   Adam Manners, MD  insulin glargine (LANTUS) 100 UNIT/ML injection Inject 0.3 mLs (30 Units total) into the skin every morning. 07/05/15   Adam Manners, MD  metoprolol succinate (TOPROL-XL) 50 MG 24 hr tablet Take 50 mg by mouth daily. Take with or immediately following a meal.    Historical Provider, MD  mycophenolate (CELLCEPT) 250 MG capsule Take by mouth 2 (two) times daily.    Historical Provider, MD  Phenyleph-Promethazine-Cod 5-6.25-10 MG/5ML SYRP Take 5 mLs by mouth 2 (two) times daily as needed. 09/14/15   Adam Orlene Och, NP  tacrolimus (PROGRAF) 1 MG capsule Take 1 capsule (1 mg total) by mouth 2 (two) times daily. 10/08/13   Adam Luis, MD   BP 120/80 mmHg  Pulse 80  Temp(Src) 98 F (36.7 C) (Oral)  Resp 16  Ht  (1.676 m)  Wt 74.844 kg  BMI 26.64 kg/m2  SpO2 98% Physical Exam  Constitutional: He is oriented to person, place, and time. He appears well-developed and well-nourished.  HENT:  Head: Normocephalic and atraumatic.  Eyes: Conjunctivae and EOM are normal.  Neck: Normal range of motion. Neck supple.  Cardiovascular: Normal rate, regular rhythm and normal heart sounds.   Pulmonary/Chest: Effort normal and breath sounds normal. He has no wheezes. He has no rales.  Abdominal: Soft. There is no tenderness.  Musculoskeletal: Normal range of motion.  Neurological: He is alert and oriented to person, place, and time. No cranial nerve deficit.  Skin: Skin is warm and dry.  Psychiatric: He has a normal mood and affect. His behavior is normal.  Nursing note  and vitals reviewed.   ED Course  Procedures (including critical care time)  DIAGNOSTIC STUDIES: Oxygen Saturation is 93% on RA, normal by my interpretation.    COORDINATION OF CARE: 5:30 PM- Will order CXR. Pt advised of plan for treatment and pt agrees.     Labs Review Labs Reviewed - No data to display  Imaging Review Dg Chest 2 View  09/14/2015  CLINICAL DATA:  Dry cough with fever for 1 week, hypertension, history LEFT renal transplant and type II diabetes mellitus EXAM: CHEST  2 VIEW COMPARISON:  None FINDINGS: Minimal enlargement of cardiac silhouette. Mediastinal contours and pulmonary vascularity normal. Lungs clear. No pleural effusion or pneumothorax. Bones unremarkable. IMPRESSION: No acute abnormalities. Electronically Signed   By: Ulyses Southward M.D.   On: 09/14/2015 17:50   I have personally reviewed and evaluated these images and lab results as part of my medical decision-making.   MDM  40 y.o. male with flu like symptoms that have improved since onset 4 days ago stable for d/c without fever and does not appear toxic. Will give cough medication and he will return for worsening symptoms. Discussed with the patient and all questioned fully answered.    Final diagnoses:  Flu-like symptoms   I personally performed the services described in this documentation, which was scribed in my presence. The recorded information has been reviewed and is accurate.     [place], NP 09/14/15 2351  Leta Baptist, MD 09/22/15 2150

## 2015-12-27 ENCOUNTER — Encounter: Payer: Self-pay | Admitting: Family Medicine

## 2015-12-27 ENCOUNTER — Ambulatory Visit (INDEPENDENT_AMBULATORY_CARE_PROVIDER_SITE_OTHER): Payer: BLUE CROSS/BLUE SHIELD | Admitting: Family Medicine

## 2015-12-27 VITALS — BP 134/97 | HR 74 | Temp 98.7°F | Ht 66.0 in | Wt 162.0 lb

## 2015-12-27 DIAGNOSIS — E139 Other specified diabetes mellitus without complications: Secondary | ICD-10-CM | POA: Diagnosis not present

## 2015-12-27 DIAGNOSIS — I10 Essential (primary) hypertension: Secondary | ICD-10-CM | POA: Diagnosis not present

## 2015-12-27 DIAGNOSIS — E785 Hyperlipidemia, unspecified: Secondary | ICD-10-CM | POA: Diagnosis not present

## 2015-12-27 DIAGNOSIS — Z794 Long term (current) use of insulin: Secondary | ICD-10-CM

## 2015-12-27 DIAGNOSIS — E1122 Type 2 diabetes mellitus with diabetic chronic kidney disease: Secondary | ICD-10-CM

## 2015-12-27 DIAGNOSIS — E1165 Type 2 diabetes mellitus with hyperglycemia: Secondary | ICD-10-CM

## 2015-12-27 LAB — POCT GLYCOSYLATED HEMOGLOBIN (HGB A1C): Hemoglobin A1C: 11.5

## 2015-12-27 MED ORDER — INSULIN GLARGINE 100 UNIT/ML ~~LOC~~ SOLN
30.0000 [IU] | SUBCUTANEOUS | Status: DC
Start: 2015-12-27 — End: 2016-02-09

## 2015-12-27 MED ORDER — ATORVASTATIN CALCIUM 40 MG PO TABS: 40.0000 mg | ORAL_TABLET | Freq: Every day | ORAL | Status: DC

## 2015-12-27 MED ORDER — INSULIN ASPART 100 UNIT/ML ~~LOC~~ SOLN: 10.0000 [IU] | Freq: Every day | SUBCUTANEOUS | Status: DC

## 2015-12-27 NOTE — Progress Notes (Signed)
  Patient name: Adam SproutMarvin T Smoots MRN [id number]  Date of birth: 12/16/1975  CC & HPI:  Adam Davis is a 40 y.o. male presenting today for Diabetes.  - Reports polyuria, polydipsia - Takes Lantus 30 units daily. - Reports taking NovoLog only 1-2 meals a month - Checks blood sugar infrequently - Denies symptoms of hypoglycemia - Denies chest pain, shortness breath, palpitations  Smoking History Noted  Objective Findings:  Vitals: BP 134/97 mmHg  Pulse 74  Temp(Src) 98.7 F (37.1 C) (Oral)  Ht 5\' 6"  (1.676 m)  Wt 162 lb (73.483 kg)  BMI 26.16 kg/m2  SpO2 96%  Gen: NAD CV: RRR w/o m/r/g, pulses +2 b/l Resp: CTAB w/ normal respiratory effort  Assessment & Plan:   Essential hypertension, benign Blood pressure at goal - Managed by nephrologist; advised him to discuss switching metoprolol to ACE-I / ARB or HCTZ  HLD (hyperlipidemia) ASCVD risk 12% - Start Lipitor 40 mg daily at bedtime  Diabetes mellitus following renal transplant Long discussion today about complications of uncontrolled diabetes.  - Advise checking blood sugar every morning and 2 hour postprandial - Follow-up in 2-3 weeks with Dr. Raymondo BandKoval - Continue Lantus 30 units daily - Restart NovoLog 10 units with large meal

## 2015-12-27 NOTE — Assessment & Plan Note (Signed)
Long discussion today about complications of uncontrolled diabetes.  - Advise checking blood sugar every morning and 2 hour postprandial - Follow-up in 2-3 weeks with Dr. Raymondo BandKoval - Continue Lantus 30 units daily - Restart NovoLog 10 units with large meal

## 2015-12-27 NOTE — Assessment & Plan Note (Signed)
ASCVD risk 12% - Start Lipitor 40 mg daily at bedtime

## 2015-12-27 NOTE — Assessment & Plan Note (Signed)
Blood pressure at goal - Managed by nephrologist; advised him to discuss switching metoprolol to ACE-I / ARB or HCTZ

## 2015-12-27 NOTE — Patient Instructions (Signed)
Check your blood sugar every morning prior to eating and taking medications.  Check your blood sugar 2 hours after meals when possible.  Bring your blood sugar long and meter to your appointment with Dr. Raymondo BandKoval in 2-3 weeks.  - Continue Lantus 30 units every morning - Restart NovoLog 10 units with your biggest meal - Start Lipitor 40 mg every night

## 2016-01-13 ENCOUNTER — Ambulatory Visit: Payer: BLUE CROSS/BLUE SHIELD | Admitting: Internal Medicine

## 2016-02-09 ENCOUNTER — Encounter: Payer: Self-pay | Admitting: Internal Medicine

## 2016-02-09 ENCOUNTER — Ambulatory Visit (INDEPENDENT_AMBULATORY_CARE_PROVIDER_SITE_OTHER): Payer: BLUE CROSS/BLUE SHIELD | Admitting: Internal Medicine

## 2016-02-09 VITALS — BP 117/85 | HR 88 | Temp 98.5°F | Wt 163.0 lb

## 2016-02-09 DIAGNOSIS — Z940 Kidney transplant status: Secondary | ICD-10-CM

## 2016-02-09 DIAGNOSIS — E1122 Type 2 diabetes mellitus with diabetic chronic kidney disease: Secondary | ICD-10-CM

## 2016-02-09 DIAGNOSIS — E1165 Type 2 diabetes mellitus with hyperglycemia: Secondary | ICD-10-CM

## 2016-02-09 DIAGNOSIS — B351 Tinea unguium: Secondary | ICD-10-CM

## 2016-02-09 DIAGNOSIS — I10 Essential (primary) hypertension: Secondary | ICD-10-CM

## 2016-02-09 DIAGNOSIS — Z794 Long term (current) use of insulin: Secondary | ICD-10-CM | POA: Diagnosis not present

## 2016-02-09 DIAGNOSIS — E139 Other specified diabetes mellitus without complications: Secondary | ICD-10-CM

## 2016-02-09 LAB — GLUCOSE, CAPILLARY: GLUCOSE-CAPILLARY: 237 mg/dL — AB (ref 65–99)

## 2016-02-09 MED ORDER — INSULIN GLARGINE 100 UNIT/ML ~~LOC~~ SOLN: 30.0000 [IU] | SUBCUTANEOUS | Status: DC

## 2016-02-09 MED ORDER — INSULIN ASPART 100 UNIT/ML ~~LOC~~ SOLN: 10.0000 [IU] | Freq: Every day | SUBCUTANEOUS | Status: DC

## 2016-02-09 MED ORDER — PNEUMOCOCCAL 13-VAL CONJ VACC IM SUSP
0.5000 mL | INTRAMUSCULAR | Status: AC
  Administered 2016-02-09: 0.5 mL via INTRAMUSCULAR

## 2016-02-09 NOTE — Assessment & Plan Note (Signed)
Nephrology managing. However, per state record review, patient has not have PNA vaccination - Prevnar 13 valent given today  - will need Pneumovax 23 in 2 months

## 2016-02-09 NOTE — Assessment & Plan Note (Addendum)
Currently uncontrolled with recent A1c 11.5 (12/2015). CBG in clinic 237. Patient is asymptomatic therefore did not check BMP.  - discussed the importance of checking CBGs regularly as it is difficult to titrate insulin regimen without this information. Check AM fasting and 2hr postprandial with the biggest meal. Advised to use the side of finger - continue Lantus 30 in the AM     - restart Novolog 10 units with the biggest meal of the day       - advised to go to optometrist for eye exam       - received Prevnar 13v today (due to history of renal transplant). Will need to get Pneumovax 23 in 2 months - f/u with Dr. Raymondo BandKoval in 3 weeks

## 2016-02-09 NOTE — Progress Notes (Signed)
Patient ID: Gwyneth SproutMarvin T Davis, male   DOB: 02/03/1976, 40 y.o.   MRN: [id number] Date of Visit: 02/09/2016   HPI:  DM: - Current regimen: Latus 30 units; Novolog 5 units: with biggest meal (per chart review, patient was instructed to take 10units with biggest meal)  - Reports CBGs AM: 130s; reports no lows; However also reports he has not checked CBG in 2 weeks because his fingers hurt. I recommended trying to use the side of his finger - ran out of Lantus for the past 2 days. Patient reports the he was waiting for the pharmacy to call when Rx was ready. - no polyuria, no polydypsia  - no nausea or vomiting or abdominal pain - did see the optometrist but did not do a dilated eye exam because at that time he did not have insurance. I advised him to make another appointment since he now has insurance - checks feet daily - last A1c 11.5 (12/2015)  Onychomycosis of Toenails:  - this has been a chronic issue - patient has not been able to get treatment because he did not have insurance - discussed options about trying oral Terbinafine vs podiatry consult. Patient opted podiatry consult   HTN: - takes Metoprolol as prescribed - reports his nephrologist initially switched him from Norvasc to Metoprolol due to cost. Patient asks if it is necessary to return to Norvasc.  - no HA, chest pain, sob, LE swelling  History of Renal Transplant:  - followed by nephrology - has not had PNA vaccination in the past; state records also checked to ensure this is true  ROS: See HPI.  PMFSH:  DM HTN Hx of Renal Transplant  PHYSICAL EXAM: BP 117/85 mmHg  Pulse 88  Temp(Src) 98.5 F (36.9 C) (Oral)  Wt 163 lb (73.936 kg)  SpO2 94%.ormal GEN: NAD CV: RRR, no murmurs, rubs, or gallops PULM: CTAB, normal effort ABD: Soft, nontender, nondistended, NABS, no organomegaly SKIN: No rash or cyanosis; warm and well-perfused EXTR: No lower extremity edema or calf tenderness Feet: severe onychomycosis of all  toe nails of bilateral feet  ASSESSMENT/PLAN:  Essential hypertension, benign Controlled on Metoprolol. Advised patient to ask Nephrologist about switching to ACE inhibitor from Beta Blocker  Diabetes mellitus following renal transplant Currently uncontrolled with recent A1c 11.5 (12/2015). CBG in clinic 237. Patient is asymptomatic therefore did not check BMP.  - discussed the importance of checking CBGs regularly as it is difficult to titrate insulin regimen without this information. Check AM fasting and 2hr postprandial with the biggest meal. Advised to use the side of finger - continue Lantus 30 in the AM     - restart Novolog 10 units with the biggest meal of the day       - advised to go to optometrist for eye exam       - received Prevnar 13v today (due to history of renal transplant). Will need to get Pneumovax 23 in 2 months   Onychomycosis of toenail Severe. Discussed options Terbinafine vs Podiatry. Patient chose podiatry consult. I think it would be valuable for him to have Podiatry in the setting of his diabetes - referral to Podiatry made   History of renal transplant Nephrology managing. However, per state record review, patient has not have PNA vaccination - Prevnar 13 valent given today  - will need Pneumovax 23 in 2 months    FOLLOW UP: Follow up with Dr. Raymondo BandKoval in 3 weeks for continued titration of insulin regimen.  Palma Holter, MD [facility]

## 2016-02-09 NOTE — Patient Instructions (Addendum)
Continue Lantus 30 units Restart Novolog 10 units with the largest meal of the day  Please start taking your sugars again. It is very important to do this. Check your blood sugar every morning prior to eating and taking medications. Check your blood sugar 2 hours after meals when possible. Please follow up with Dr. Raymondo BandKoval in 3 weeks. I made a referral to podiatry. You should get a call in the next week or two. If you do not hear from them, please give us a call.    Pneumococcal Vaccine, Polyvalent suspension for injection What is this medicine? PNEUMOCOCCAL VACCINE (NEU mo KOK al vak SEEN) is a vaccine used to prevent pneumococcus bacterial infections. These bacteria can cause serious infections like pneumonia, meningitis, and blood infections. This vaccine will lower your chance of getting pneumonia. If you do get pneumonia, it can make your symptoms milder and your illness shorter. This vaccine will not treat an infection and will not cause infection. This vaccine is recommended for infants and young children, adults with certain medical conditions, and adults 65 years or older. This medicine may be used for other purposes; ask your health care provider or pharmacist if you have questions. What should I tell my health care provider before I take this medicine? They need to know if you have any of these conditions: -bleeding problems -fever -immune system problems -an unusual or allergic reaction to pneumococcal vaccine, diphtheria toxoid, other vaccines, latex, other medicines, foods, dyes, or preservatives -pregnant or trying to get pregnant -breast-feeding How should I use this medicine? This vaccine is for injection into a muscle. It is given by a health care professional. A copy of Vaccine Information Statements will be given before each vaccination. Read this sheet carefully each time. The sheet may change frequently. Talk to your pediatrician regarding the use of this medicine in  children. While this drug may be prescribed for children as young as 326 weeks old for selected conditions, precautions do apply. Overdosage: If you think you have taken too much of this medicine contact a poison control center or emergency room at once. NOTE: This medicine is only for you. Do not share this medicine with others. What if I miss a dose? It is important not to miss your dose. Call your doctor or health care professional if you are unable to keep an appointment. What may interact with this medicine? -medicines for cancer chemotherapy -medicines that suppress your immune function -steroid medicines like prednisone or cortisone This list may not describe all possible interactions. Give your health care provider a list of all the medicines, herbs, non-prescription drugs, or dietary supplements you use. Also tell them if you smoke, drink alcohol, or use illegal drugs. Some items may interact with your medicine. What should I watch for while using this medicine? Mild fever and pain should go away in 3 days or less. Report any unusual symptoms to your doctor or health care professional. What side effects may I notice from receiving this medicine? Side effects that you should report to your doctor or health care professional as soon as possible: -allergic reactions like skin rash, itching or hives, swelling of the face, lips, or tongue -breathing problems -confused -fast or irregular heartbeat -fever over 102 degrees F -seizures -unusual bleeding or bruising -unusual muscle weakness Side effects that usually do not require medical attention (report to your doctor or health care professional if they continue or are bothersome): -aches and pains -diarrhea -fever of 102 degrees F or  less -headache -irritable -loss of appetite -pain, tender at site where injected -trouble sleeping This list may not describe all possible side effects. Call your doctor for medical advice about side  effects. You may report side effects to FDA at 1-800-FDA-1088. Where should I keep my medicine? This does not apply. This vaccine is given in a clinic, pharmacy, doctor's office, or other health care setting and will not be stored at home. NOTE: This sheet is a summary. It may not cover all possible information. If you have questions about this medicine, talk to your doctor, pharmacist, or health care provider.    2016, Elsevier/Gold Standard. (2014-05-21 10:27:27)

## 2016-02-09 NOTE — Assessment & Plan Note (Signed)
Controlled on Metoprolol. Advised patient to ask Nephrologist about switching to ACE inhibitor from Beta Blocker

## 2016-02-09 NOTE — Assessment & Plan Note (Signed)
Severe. Discussed options Terbinafine vs Podiatry. Patient chose podiatry consult. I think it would be valuable for him to have Podiatry in the setting of his diabetes - referral to Podiatry made

## 2016-02-11 ENCOUNTER — Other Ambulatory Visit: Payer: Self-pay | Admitting: *Deleted

## 2016-02-11 DIAGNOSIS — E1165 Type 2 diabetes mellitus with hyperglycemia: Principal | ICD-10-CM

## 2016-02-11 DIAGNOSIS — Z794 Long term (current) use of insulin: Principal | ICD-10-CM

## 2016-02-11 DIAGNOSIS — E1122 Type 2 diabetes mellitus with diabetic chronic kidney disease: Secondary | ICD-10-CM

## 2016-02-11 MED ORDER — INSULIN ASPART 100 UNIT/ML ~~LOC~~ SOLN: 10.0000 [IU] | Freq: Every day | SUBCUTANEOUS | Status: DC

## 2016-02-11 NOTE — Telephone Encounter (Signed)
Received refill request for Novolog from [facility].  Rx was approved but stated print.  Rx sent via fax to [facility].  Clovis PuMartin, Tamika L, RN

## 2016-03-24 ENCOUNTER — Other Ambulatory Visit: Payer: Self-pay | Admitting: *Deleted

## 2016-03-24 DIAGNOSIS — E1165 Type 2 diabetes mellitus with hyperglycemia: Principal | ICD-10-CM

## 2016-03-24 DIAGNOSIS — Z794 Long term (current) use of insulin: Principal | ICD-10-CM

## 2016-03-24 DIAGNOSIS — E1122 Type 2 diabetes mellitus with diabetic chronic kidney disease: Secondary | ICD-10-CM

## 2016-03-26 ENCOUNTER — Other Ambulatory Visit: Payer: Self-pay | Admitting: Internal Medicine

## 2016-03-26 DIAGNOSIS — E1122 Type 2 diabetes mellitus with diabetic chronic kidney disease: Secondary | ICD-10-CM

## 2016-03-26 DIAGNOSIS — Z794 Long term (current) use of insulin: Principal | ICD-10-CM

## 2016-03-26 DIAGNOSIS — E1165 Type 2 diabetes mellitus with hyperglycemia: Principal | ICD-10-CM

## 2016-03-26 MED ORDER — INSULIN GLARGINE 100 UNIT/ML ~~LOC~~ SOLN: 30.0000 [IU] | SUBCUTANEOUS | 2 refills | Status: DC

## 2016-03-26 MED ORDER — INSULIN ASPART 100 UNIT/ML ~~LOC~~ SOLN: 10.0000 [IU] | Freq: Every day | SUBCUTANEOUS | 0 refills | Status: DC

## 2016-03-27 ENCOUNTER — Other Ambulatory Visit: Payer: Self-pay | Admitting: Internal Medicine

## 2016-03-27 ENCOUNTER — Ambulatory Visit: Payer: Self-pay | Admitting: Podiatry

## 2016-03-27 DIAGNOSIS — Z794 Long term (current) use of insulin: Principal | ICD-10-CM

## 2016-03-27 DIAGNOSIS — E1122 Type 2 diabetes mellitus with diabetic chronic kidney disease: Secondary | ICD-10-CM

## 2016-03-27 DIAGNOSIS — E1165 Type 2 diabetes mellitus with hyperglycemia: Principal | ICD-10-CM

## 2016-03-27 MED ORDER — INSULIN GLARGINE 100 UNIT/ML ~~LOC~~ SOLN: 30.0000 [IU] | SUBCUTANEOUS | 2 refills | Status: DC

## 2016-03-27 MED ORDER — INSULIN ASPART 100 UNIT/ML ~~LOC~~ SOLN: 10.0000 [IU] | Freq: Every day | SUBCUTANEOUS | 2 refills | Status: DC

## 2016-04-12 ENCOUNTER — Emergency Department (HOSPITAL_COMMUNITY)
Admission: EM | Admit: 2016-04-12 | Discharge: 2016-04-12 | Disposition: A | Discharge status: Home / Self Care | Payer: Self-pay | Attending: Emergency Medicine | Admitting: Emergency Medicine

## 2016-04-12 ENCOUNTER — Emergency Department (HOSPITAL_COMMUNITY): Payer: Self-pay

## 2016-04-12 ENCOUNTER — Encounter (HOSPITAL_COMMUNITY): Payer: Self-pay

## 2016-04-12 DIAGNOSIS — Z940 Kidney transplant status: Secondary | ICD-10-CM | POA: Insufficient documentation

## 2016-04-12 DIAGNOSIS — Z794 Long term (current) use of insulin: Secondary | ICD-10-CM | POA: Insufficient documentation

## 2016-04-12 DIAGNOSIS — R079 Chest pain, unspecified: Secondary | ICD-10-CM | POA: Insufficient documentation

## 2016-04-12 DIAGNOSIS — E119 Type 2 diabetes mellitus without complications: Secondary | ICD-10-CM | POA: Insufficient documentation

## 2016-04-12 DIAGNOSIS — I10 Essential (primary) hypertension: Secondary | ICD-10-CM | POA: Insufficient documentation

## 2016-04-12 LAB — CBC
HEMATOCRIT: 44.1 % (ref 39.0–52.0)
Hemoglobin: 14.8 g/dL (ref 13.0–17.0)
MCH: 25 pg — ABNORMAL LOW (ref 26.0–34.0)
MCHC: 33.6 g/dL (ref 30.0–36.0)
MCV: 74.5 fL — AB (ref 78.0–100.0)
PLATELETS: 160 10*3/uL (ref 150–400)
RBC: 5.92 MIL/uL — AB (ref 4.22–5.81)
RDW: 13.7 % (ref 11.5–15.5)
WBC: 7.9 10*3/uL (ref 4.0–10.5)

## 2016-04-12 LAB — TROPONIN I
Troponin I: 0.03 ng/mL (ref <0.03)
Troponin I: <0.03 ng/mL (ref <0.03)

## 2016-04-12 LAB — BASIC METABOLIC PANEL
ANION GAP: 5 (ref 5–15)
BUN: 33 mg/dL — ABNORMAL HIGH (ref 6–20)
CHLORIDE: 107 mmol/L (ref 101–111)
CO2: 25 mmol/L (ref 22–32)
Calcium: 9.8 mg/dL (ref 8.9–10.3)
Creatinine, Ser: 1.73 mg/dL — ABNORMAL HIGH (ref 0.61–1.24)
GFR calc non Af Amer: 47 mL/min — ABNORMAL LOW (ref >60)
GFR, EST AFRICAN AMERICAN: 55 mL/min — AB (ref >60)
Glucose, Bld: 130 mg/dL — ABNORMAL HIGH (ref 65–99)
POTASSIUM: 3.9 mmol/L (ref 3.5–5.1)
SODIUM: 137 mmol/L (ref 135–145)

## 2016-04-12 NOTE — Discharge Instructions (Signed)
Continue taking your home medications as prescribed. Follow-up with your primary care provider within the next week. Follow-up with your nephrologist at her next scheduled appointment. Return to the emergency department if symptoms worsen or new onset of fever, lightheadedness, dizziness, new/worsening chest pain, difficulty breathing, productive cough, palpitations, vomiting, numbness, tingling, weakness, syncope.

## 2016-04-12 NOTE — ED Triage Notes (Signed)
Pt states he started having centralized chest pain this morning about 12am; No n/v; Pt states sudden onset; Pt speaking in full complete senteces at triage.

## 2016-04-12 NOTE — ED Provider Notes (Signed)
[facility]-EMERGENCY DEPT Provider Note   CSN: [id number] Arrival date & time: 04/12/16  [id number]     History   Chief Complaint Chief Complaint  Patient presents with  . Chest Pain    HPI Gwyneth SproutMarvin T Chauncey is a 40 y.o. male.  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 Denies radiation. Patient reports since arrival to the ED and sitting and resting his chest pain has completely resolved. Endorses feeling like his heart was "fluttering" with initial onset of chest pain. Patient denies history of similar chest pain in the past. Denies fever, chills, headache, lightheadedness, dizziness, diaphoresis, cough, shortness of breath, nausea, vomiting, abdominal pain, numbness, tingling, weakness. Denies personal or family history of cardiac disease. Denies smoking. Patient notes he is followed by nephrology (Dr. Raymondo BandKoval) regarding his renal transplant. PCP Kathrene BongoGoldsborough.       Past Medical History:  Diagnosis Date  . Diabetes mellitus   . H/O acute post-streptococcal glomerulonephritis 1997  . History of renal transplant   . Hyperlipidemia   . Hypertension   . Onychomycosis of toenail   . Tachycardia     Patient Active Problem List   Diagnosis Date Noted  . Essential hypertension, benign 10/30/2012  . HLD (hyperlipidemia) 08/08/2012  . Onychomycosis of toenail 07/04/2012  . 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/2012  . History of renal transplant 05/22/2012    Past Surgical History:  Procedure Laterality Date  . ANKLE SURGERY    . AV FISTULA PLACEMENT  1997   Removed after transplant in 2006  . KIDNEY SURGERY  2006       Home Medications    Prior to Admission  medications   Medication Sig Start Date End Date Taking? Authorizing Provider  atorvastatin (LIPITOR) 40 MG tablet Take 1 tablet (40 mg total) by mouth daily. 12/27/15  Yes Jamal CollinJames R Joyner, MD  insulin aspart (NOVOLOG) 100 UNIT/ML injection Inject 10 Units into the skin daily before supper. (before biggest meal) 03/27/16  Yes Palma HolterKanishka G Gunadasa, MD  insulin glargine (LANTUS) 100 UNIT/ML injection Inject 0.3 mLs (30 Units total) into the skin every morning. 03/27/16  Yes Palma HolterKanishka G Gunadasa, MD  metoprolol succinate (TOPROL-XL) 50 MG 24 hr tablet Take 50 mg by mouth daily. Take with or immediately following a meal.   Yes Historical Provider, MD  mycophenolate (CELLCEPT) 250 MG capsule Take 500 mg by mouth 2 (two) times daily.    Yes Historical Provider, MD  tacrolimus (PROGRAF) 1 MG capsule Take 1 capsule (1 mg total) by mouth 2 (two) times daily. 10/08/13  Yes Glori LuisEric G Sonnenberg, MD  Phenyleph-Promethazine-Cod 5-6.25-10 MG/5ML SYRP Take 5 mLs by mouth 2 (two) times daily as needed. Patient not taking: Reported on 04/12/2016 09/14/15   Janne NapoleonHope M Neese, NP    Family History Family History  Problem Relation Age of Onset  . Diabetes Brother   . Diabetes Maternal Grandmother   . Hypertension Maternal Grandmother     Social History Social History  Substance Use Topics  . Smoking status: Never Smoker  . Smokeless tobacco: Not on file  . Alcohol use No     Allergies   Review of patient's allergies indicates no known allergies.   Review of Systems Review  of Systems  Cardiovascular: Positive for chest pain and palpitations.  All other systems reviewed and are negative.    Physical Exam Updated Vital Signs BP 137/94   Pulse 74   Temp 98.2 F (36.8 C) (Oral)   Resp 19   Ht 5\' 6"  (1.676 m)   Wt 78 kg   SpO2 97%   BMI 27.76 kg/m   Physical Exam  Constitutional: He is oriented to person, place, and time. He appears well-developed and well-nourished.  HENT:  Head: Normocephalic and  atraumatic.  Mouth/Throat: Oropharynx is clear and moist. No oropharyngeal exudate.  Eyes: Conjunctivae and EOM are normal. Right eye exhibits no discharge. Left eye exhibits no discharge. No scleral icterus.  Neck: Normal range of motion. Neck supple.  Cardiovascular: Normal rate, regular rhythm, normal heart sounds and intact distal pulses.   Pulmonary/Chest: Effort normal and breath sounds normal. No respiratory distress. He has no wheezes. He has no rales. He exhibits no tenderness.  Abdominal: Soft. Bowel sounds are normal. He exhibits no distension and no mass. There is no tenderness. There is no rebound and no guarding. No hernia.  Musculoskeletal: Normal range of motion. He exhibits no edema.  Neurological: He is alert and oriented to person, place, and time.  Skin: Skin is warm and dry.  Nursing note and vitals reviewed.    ED Treatments / Results  Labs (all labs ordered are listed, but only abnormal results are displayed) Labs Reviewed  BASIC METABOLIC PANEL - Abnormal; Notable for the following:       Result Value   Glucose, Bld 130 (*)    BUN 33 (*)    Creatinine, Ser 1.73 (*)    GFR calc non Af Amer 47 (*)    GFR calc Af Amer 55 (*)    All other components within normal limits  CBC - Abnormal; Notable for the following:    RBC 5.92 (*)    MCV 74.5 (*)    MCH 25.0 (*)    All other components within normal limits  TROPONIN I - Abnormal; Notable for the following:    Troponin I 0.03 (*)    All other components within normal limits  TROPONIN I  I-STAT TROPOININ, ED    EKG  EKG Interpretation  Date/Time:  Wednesday April 12 2016 03:25:41 EDT Ventricular Rate:  94 PR Interval:  162 QRS Duration: 82 QT Interval:  348 QTC Calculation: 435 R Axis:   90 Text Interpretation:  Normal sinus rhythm Rightward axis Borderline ECG Confirmed by Wilkie AyeHORTON  MD, COURTNEY ([id number]) on 04/12/2016 6:23:39 AM       Radiology Dg Chest 2 View  Result Date: 04/12/2016 CLINICAL  DATA:  Acute onset of mid chest pain.  Initial encounter. EXAM: CHEST  2 VIEW COMPARISON:  Chest radiograph performed 09/14/2015 FINDINGS: The lungs are well-aerated and clear. There is no evidence of focal opacification, pleural effusion or pneumothorax. The heart is normal in size; the mediastinal contour is within normal limits. No acute osseous abnormalities are seen. IMPRESSION: No acute cardiopulmonary process seen. Electronically Signed   By: Roanna RaiderJeffery  Chang M.D.   On: 04/12/2016 04:01    Procedures Procedures (including critical care time)  Medications Ordered in ED Medications - No data to display   Initial Impression / Assessment and Plan / ED Course  I have reviewed the triage vital signs and the nursing notes.  Pertinent labs & imaging results that were available during my care of the patient were reviewed by  me and considered in my medical decision making (see chart for details).  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 Chest pain has resolved since arrival to the ED. History of hypertension, diabetes and kidney transplant status post poststreptococcal glomerulonephritis. Denies personal or family history of cardiac disease. VSS. Exam unremarkable. Patient denies any pain or complaints at this time.   EKG showed normal sinus rhythm without any acute ischemic changes. Chest x-ray negative. Initial troponin 0.03. Remaining labs unremarkable. HEART score 2. PERC negative. Suspect mild trop elevation is likely due to pt's chronic renal impairment s/p kidney transplant. Will order delta trop for complete cardiac evaluation.  Delta troponin negative. I have a low suspicion for ACS, PE, dissection, or other acute cardiac event at this time. Plan to discharge patient home with PCP follow-up. Discussed results and plan for discharge with patient. Discussed return precautions with  patient.  Final Clinical Impressions(s) / ED Diagnoses   Final diagnoses:  Chest pain, unspecified chest pain type    New Prescriptions New Prescriptions   No medications on file     Barrett Henle, PA-C 04/12/16 0740    Shon Baton, MD 04/12/16 2300

## 2016-04-18 ENCOUNTER — Emergency Department (HOSPITAL_COMMUNITY): Payer: No Typology Code available for payment source

## 2016-04-18 ENCOUNTER — Emergency Department (HOSPITAL_COMMUNITY)
Admission: EM | Admit: 2016-04-18 | Discharge: 2016-04-19 | Disposition: A | Discharge status: Home / Self Care | Payer: No Typology Code available for payment source | Attending: Emergency Medicine | Admitting: Emergency Medicine

## 2016-04-18 ENCOUNTER — Encounter (HOSPITAL_COMMUNITY): Payer: Self-pay | Admitting: Emergency Medicine

## 2016-04-18 DIAGNOSIS — Y999 Unspecified external cause status: Secondary | ICD-10-CM | POA: Insufficient documentation

## 2016-04-18 DIAGNOSIS — Z940 Kidney transplant status: Secondary | ICD-10-CM | POA: Diagnosis not present

## 2016-04-18 DIAGNOSIS — Y939 Activity, unspecified: Secondary | ICD-10-CM | POA: Diagnosis not present

## 2016-04-18 DIAGNOSIS — E119 Type 2 diabetes mellitus without complications: Secondary | ICD-10-CM | POA: Diagnosis not present

## 2016-04-18 DIAGNOSIS — M549 Dorsalgia, unspecified: Secondary | ICD-10-CM

## 2016-04-18 DIAGNOSIS — I10 Essential (primary) hypertension: Secondary | ICD-10-CM | POA: Insufficient documentation

## 2016-04-18 DIAGNOSIS — Z79899 Other long term (current) drug therapy: Secondary | ICD-10-CM | POA: Insufficient documentation

## 2016-04-18 DIAGNOSIS — M545 Low back pain: Secondary | ICD-10-CM | POA: Insufficient documentation

## 2016-04-18 DIAGNOSIS — Z794 Long term (current) use of insulin: Secondary | ICD-10-CM | POA: Insufficient documentation

## 2016-04-18 DIAGNOSIS — Y92410 Unspecified street and highway as the place of occurrence of the external cause: Secondary | ICD-10-CM | POA: Diagnosis not present

## 2016-04-18 DIAGNOSIS — S80211A Abrasion, right knee, initial encounter: Secondary | ICD-10-CM | POA: Insufficient documentation

## 2016-04-18 DIAGNOSIS — M25561 Pain in right knee: Secondary | ICD-10-CM

## 2016-04-18 DIAGNOSIS — S8991XA Unspecified injury of right lower leg, initial encounter: Secondary | ICD-10-CM | POA: Diagnosis present

## 2016-04-18 HISTORY — DX: Disorder of kidney and ureter, unspecified: N28.9

## 2016-04-18 MED ORDER — NAPROXEN 250 MG PO TABS
500.0000 mg | ORAL_TABLET | Freq: Once | ORAL | Status: AC
  Administered 2016-04-18: 500 mg via ORAL
  Filled 2016-04-18: qty 2

## 2016-04-18 MED ORDER — METHOCARBAMOL 500 MG PO TABS
500.0000 mg | ORAL_TABLET | Freq: Once | ORAL | Status: AC
  Administered 2016-04-18: 500 mg via ORAL
  Filled 2016-04-18: qty 1

## 2016-04-18 NOTE — ED Notes (Signed)
Pt stepped to car.
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
## 2016-04-19 MED ORDER — NAPROXEN 500 MG PO TABS: 500.0000 mg | ORAL_TABLET | Freq: Two times a day (BID) | ORAL | 0 refills | Status: DC

## 2016-04-19 MED ORDER — METHOCARBAMOL 500 MG PO TABS: 500.0000 mg | ORAL_TABLET | Freq: Two times a day (BID) | ORAL | 0 refills | Status: DC
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
## 2016-04-19 NOTE — ED Notes (Signed)
Pt departed in NAD, refused use of wheelchair.  

## 2016-05-16 ENCOUNTER — Other Ambulatory Visit: Payer: Self-pay | Admitting: *Deleted

## 2016-05-16 DIAGNOSIS — E1122 Type 2 diabetes mellitus with diabetic chronic kidney disease: Secondary | ICD-10-CM

## 2016-05-16 DIAGNOSIS — Z794 Long term (current) use of insulin: Principal | ICD-10-CM

## 2016-05-16 DIAGNOSIS — E1165 Type 2 diabetes mellitus with hyperglycemia: Principal | ICD-10-CM

## 2016-05-16 MED ORDER — INSULIN ASPART 100 UNIT/ML ~~LOC~~ SOLN: 10.0000 [IU] | Freq: Every day | SUBCUTANEOUS | 2 refills | Status: DC

## 2016-06-29 NOTE — Progress Notes (Deleted)
   [facility] Phone: [phone]   Date of Visit: 06/30/2016   HPI:  DM2:  - A1c: 11.5 (12/2015) < 10 (03/2015) < 8.2 (09/2013)  ROS: See HPI.  PMFSH: ***  PHYSICAL EXAM: There were no vitals taken for this visit. Gen: *** HEENT: *** Heart: *** Lungs: *** Neuro: *** Ext: ***  ASSESSMENT/PLAN:  Health maintenance:  -***  No problem-specific Assessment & Plan notes found for this encounter.  FOLLOW UP: Follow up in *** for ***  Palma HolterKanishka G Gunadasa, MD PGY 2 [facility]

## 2016-06-30 ENCOUNTER — Ambulatory Visit: Payer: Self-pay | Admitting: Internal Medicine

## 2016-07-06 ENCOUNTER — Ambulatory Visit: Payer: Self-pay | Admitting: Internal Medicine

## 2016-07-12 NOTE — Progress Notes (Signed)
   [facility] Phone: [phone]   Date of Visit: 07/13/2016   HPI:  DM2: - Lantus 45 units in the AM. Novolog 10 units with biggest meal which varies because he is working two jobs  - Kidney doctor recommended to take 45 units of Lantus (30units prior) about 2 weeks ago - checks cbgs in the AM and "when its high". Does not check before meals - CBGs in the AM: 135 yesterday. 129-130 since starting 45units. Before that its been in the same range. Highest 180-200.   - no hypoglycemia - does not check cbgs daily - reports he is frustrated that his A1c is not controlled as he watches what he eats and exercises regularly - eye doctor: did not get dilated eye exam. has appointment next week  - up to date on PNA vaccines: PNA 13V and PNA 23V (requires both due to renal transplant) - reports he got labs done at nephrologist 2 weeks ago. This clinic note is not in epic yet.  - Not taking Atorvastatin (which is on his medication list)  ROS: See HPI.  PMFSH:  DM2 HTN Hx of Renal Transplant  PHYSICAL EXAM: BP 118/80   Pulse 67   Temp 98.4 F (36.9 C) (Oral)   Wt 167 lb (75.8 kg)   SpO2 98%   BMI 26.95 kg/m  GEN: NAD CV: RRR, no murmurs, rubs, or gallops PULM: CTAB, normal effort SKIN: No rash or cyanosis; warm and well-perfused EXTR: No lower extremity edema or calf tenderness PSYCH: Mood and affect euthymic, normal rate and volume of speech NEURO: Awake, alert, no focal deficits grossly, normal speech Diabetic Foot Exam - Simple   Simple Foot Form Diabetic Foot exam was performed with the following findings:  Yes 07/13/2016  8:15 AM  Visual Inspection No deformities, no ulcerations, no other skin breakdown bilaterally:  Yes See comments:  Yes Sensation Testing Intact to touch and monofilament testing bilaterally:  Yes Pulse Check Posterior Tibialis and Dorsalis pulse intact bilaterally:  Yes Comments onychomycosis of the toe nails       ASSESSMENT/PLAN:  Health maintenance:  - PNA 23 V today  - Declined flu shot in this clinic. Would like to ask nephrologist about this.   Diabetes mellitus following renal transplant A1c not at goal and with marginal improvement from 11.5 to 11. Lantus dose was recently increased from 30 to 45 by nephrologist 2 weeks ago. Unable to make changes to insulin regimen as patient does not have cbg ranges. Discussed the importance of keeping a cbg log so that we can better manage his diabetes. Patient to check AM fasting cbg and before meals. Follow up in 2-3 weeks with Dr. Raymondo BandKoval in pharmacy clinic. PNA 24V vaccine given today. Now up to date on PNA vaccines.   HLD (hyperlipidemia) Patient not aware he was supposed to be on Atorvastatin. Refill sent to pharmacy. Patient thinks nephrologist may have obtained lipid panel two weeks ago. Clinc note is not scanned in to our system yet.   Essential hypertension, benign BP at goal. Continue Metoprolol.   Adam HolterKanishka G Glynnis Gavel, MD PGY 2 [facility]

## 2016-07-13 ENCOUNTER — Encounter: Payer: Self-pay | Admitting: Internal Medicine

## 2016-07-13 ENCOUNTER — Ambulatory Visit (INDEPENDENT_AMBULATORY_CARE_PROVIDER_SITE_OTHER): Payer: Self-pay | Admitting: Internal Medicine

## 2016-07-13 VITALS — BP 118/80 | HR 67 | Temp 98.4°F | Wt 167.0 lb

## 2016-07-13 DIAGNOSIS — E785 Hyperlipidemia, unspecified: Secondary | ICD-10-CM

## 2016-07-13 DIAGNOSIS — I10 Essential (primary) hypertension: Secondary | ICD-10-CM

## 2016-07-13 DIAGNOSIS — E139 Other specified diabetes mellitus without complications: Secondary | ICD-10-CM

## 2016-07-13 LAB — POCT GLYCOSYLATED HEMOGLOBIN (HGB A1C): Hemoglobin A1C: 11

## 2016-07-13 MED ORDER — PNEUMOCOCCAL VAC POLYVALENT 25 MCG/0.5ML IJ INJ
0.5000 mL | INJECTION | INTRAMUSCULAR | Status: AC
  Administered 2016-07-13: 0.5 mL via INTRAMUSCULAR

## 2016-07-13 MED ORDER — ATORVASTATIN CALCIUM 40 MG PO TABS: 40.0000 mg | ORAL_TABLET | Freq: Every day | ORAL | 1 refills | Status: DC

## 2016-07-13 NOTE — Assessment & Plan Note (Signed)
A1c not at goal and with marginal improvement from 11.5 to 11. Lantus dose was recently increased from 30 to 45 by nephrologist 2 weeks ago. Unable to make changes to insulin regimen as patient does not have cbg ranges. Discussed the importance of keeping a cbg log so that we can better manage his diabetes. Patient to check AM fasting cbg and before meals. Follow up in 2-3 weeks with Dr. Raymondo BandKoval in pharmacy clinic. PNA 24V vaccine given today. Now up to date on PNA vaccines.

## 2016-07-13 NOTE — Assessment & Plan Note (Signed)
BP at goal. Continue Metoprolol.

## 2016-07-13 NOTE — Patient Instructions (Signed)
Please keep a log of your sugars: fasting in the morning and then before each meal. Please make a follow up appointment in 3 weeks with Dr. Raymondo BandKoval for diabetes management.  You got your second pneumonia shot today

## 2016-07-13 NOTE — Assessment & Plan Note (Signed)
Patient not aware he was supposed to be on Atorvastatin. Refill sent to pharmacy. Patient thinks nephrologist may have obtained lipid panel two weeks ago. Clinc note is not scanned in to our system yet.

## 2016-08-01 ENCOUNTER — Other Ambulatory Visit: Payer: Self-pay | Admitting: *Deleted

## 2016-08-01 DIAGNOSIS — Z794 Long term (current) use of insulin: Principal | ICD-10-CM

## 2016-08-01 DIAGNOSIS — E1122 Type 2 diabetes mellitus with diabetic chronic kidney disease: Secondary | ICD-10-CM

## 2016-08-01 DIAGNOSIS — E1165 Type 2 diabetes mellitus with hyperglycemia: Principal | ICD-10-CM

## 2016-08-02 MED ORDER — INSULIN GLARGINE 100 UNIT/ML ~~LOC~~ SOLN: 30.0000 [IU] | SUBCUTANEOUS | 2 refills | Status: DC

## 2016-08-02 NOTE — Telephone Encounter (Signed)
Left message informing patient that insulin was refilled but he needs a follow up appointment. Will try calling patient again. If he happens to call back please schedule a follow up visit per PCP. Maryjean Mornempestt S Sevilla Murtagh, CMA

## 2016-08-02 NOTE — Telephone Encounter (Signed)
Please call patient and have him make a follow up visit for diabetes. He was to make a follow up visit with Dr. Raymondo BandKoval (in pharmacy clinic) 2-3 weeks after his last appointment at our clinic. Please remind him to bring his log of his sugars as we are unable to make any changes to his insulin without this data.

## 2016-10-31 DIAGNOSIS — I10 Essential (primary) hypertension: Secondary | ICD-10-CM | POA: Diagnosis not present

## 2016-10-31 DIAGNOSIS — E785 Hyperlipidemia, unspecified: Secondary | ICD-10-CM | POA: Diagnosis not present

## 2016-10-31 DIAGNOSIS — Z940 Kidney transplant status: Secondary | ICD-10-CM | POA: Diagnosis not present

## 2016-10-31 DIAGNOSIS — E1129 Type 2 diabetes mellitus with other diabetic kidney complication: Secondary | ICD-10-CM | POA: Diagnosis not present

## 2016-11-01 ENCOUNTER — Other Ambulatory Visit: Payer: Self-pay | Admitting: *Deleted

## 2016-11-01 DIAGNOSIS — E1165 Type 2 diabetes mellitus with hyperglycemia: Principal | ICD-10-CM

## 2016-11-01 DIAGNOSIS — E1122 Type 2 diabetes mellitus with diabetic chronic kidney disease: Secondary | ICD-10-CM

## 2016-11-01 DIAGNOSIS — Z794 Long term (current) use of insulin: Principal | ICD-10-CM

## 2016-11-01 MED ORDER — INSULIN ASPART 100 UNIT/ML ~~LOC~~ SOLN: 10.0000 [IU] | Freq: Every day | SUBCUTANEOUS | 2 refills | Status: DC

## 2016-11-01 NOTE — Telephone Encounter (Signed)
Will refill prescription. Please inform patient that he needs to follow up with Dr. Raymondo BandKoval in pharmacy clinic for his diabetes as we discussed in 06/2016.

## 2016-11-08 NOTE — Telephone Encounter (Signed)
LM for patient on both numbers listed asking him to call back to the office.  Will also mail a letter. When patient calls back please assist him in making an appointment with Dr. Raymondo BandKoval to discuss diabetes. Maira Christon,CMA

## 2017-01-18 NOTE — Progress Notes (Deleted)
   [facility] Phone: [phone]   Date of Visit: 01/19/2017   HPI:  DM2:  - A1c 11 (06/2016) < 11.5 (12/2015) < 10 (03/2015)  ROS: See HPI.  PMFSH:  PMH: HTN DM3  Onychomycosis of toenail History of Renal Transplant HLD  PHYSICAL EXAM: There were no vitals taken for this visit. Gen: *** HEENT: *** Heart: *** Lungs: *** Neuro: *** Ext: ***  ASSESSMENT/PLAN:  Health maintenance:  -***  No problem-specific Assessment & Plan notes found for this encounter.  FOLLOW UP: Follow up in *** for ***  Adam HolterKanishka G Ellanie Oppedisano, MD PGY 2 [facility]

## 2017-01-19 ENCOUNTER — Ambulatory Visit: Payer: Self-pay | Admitting: Internal Medicine

## 2017-02-06 ENCOUNTER — Encounter: Payer: Self-pay | Admitting: Internal Medicine

## 2017-02-06 ENCOUNTER — Ambulatory Visit (INDEPENDENT_AMBULATORY_CARE_PROVIDER_SITE_OTHER): Payer: BLUE CROSS/BLUE SHIELD | Admitting: Internal Medicine

## 2017-02-06 VITALS — BP 112/68 | HR 77 | Temp 98.5°F | Ht 66.0 in | Wt 174.0 lb

## 2017-02-06 DIAGNOSIS — N182 Chronic kidney disease, stage 2 (mild): Secondary | ICD-10-CM

## 2017-02-06 DIAGNOSIS — Z794 Long term (current) use of insulin: Secondary | ICD-10-CM

## 2017-02-06 DIAGNOSIS — E1122 Type 2 diabetes mellitus with diabetic chronic kidney disease: Secondary | ICD-10-CM

## 2017-02-06 DIAGNOSIS — E1169 Type 2 diabetes mellitus with other specified complication: Secondary | ICD-10-CM | POA: Diagnosis not present

## 2017-02-06 DIAGNOSIS — Z940 Kidney transplant status: Secondary | ICD-10-CM

## 2017-02-06 DIAGNOSIS — IMO0001 Reserved for inherently not codable concepts without codable children: Secondary | ICD-10-CM

## 2017-02-06 DIAGNOSIS — E1165 Type 2 diabetes mellitus with hyperglycemia: Secondary | ICD-10-CM

## 2017-02-06 DIAGNOSIS — E139 Other specified diabetes mellitus without complications: Secondary | ICD-10-CM

## 2017-02-06 DIAGNOSIS — E785 Hyperlipidemia, unspecified: Secondary | ICD-10-CM

## 2017-02-06 DIAGNOSIS — B351 Tinea unguium: Secondary | ICD-10-CM

## 2017-02-06 LAB — POCT URINALYSIS DIP (MANUAL ENTRY)
BILIRUBIN UA: NEGATIVE mg/dL
Bilirubin, UA: NEGATIVE
LEUKOCYTES UA: NEGATIVE
Nitrite, UA: NEGATIVE
Spec Grav, UA: 1.025 (ref 1.010–1.025)
Urobilinogen, UA: 0.2 E.U./dL
pH, UA: 5.5 (ref 5.0–8.0)

## 2017-02-06 LAB — POCT GLYCOSYLATED HEMOGLOBIN (HGB A1C): Hemoglobin A1C: 10.3

## 2017-02-06 MED ORDER — GLUCOSE BLOOD VI STRP: ORAL_STRIP | 12 refills | Status: DC

## 2017-02-06 MED ORDER — AGAMATRIX ULTRA-THIN LANCETS MISC: 2 refills | Status: DC

## 2017-02-06 MED ORDER — AGAMATRIX PRESTO PRO METER DEVI: 0 refills | Status: DC

## 2017-02-06 MED ORDER — INSULIN GLARGINE 100 UNIT/ML ~~LOC~~ SOLN: 45.0000 [IU] | Freq: Every day | SUBCUTANEOUS | 2 refills | Status: DC

## 2017-02-06 NOTE — Patient Instructions (Addendum)
Please check your sugar in the morning before eating and before each meal.  I sent glucose checking supplies to the pharmacy Please set up mychart and mychart me in 1 week with sugars  Please make a follow up visit in 2 weeks I made a referral to the nutritionist. Please call her and make an appointment with her  I made a referral to the foot doctor.     Diet Recommendations for Diabetes   Starchy (carb) foods include: Bread, rice, pasta, potatoes, corn, crackers, bagels, muffins, all baked goods.  (Fruits, milk, and yogurt also have carbohydrate, but most of these foods will not spike your blood sugar as the starchy foods will.)  A few fruits do cause high blood sugars; use small portions of bananas (limit to 1/2 at a time), grapes, and most tropical fruits.    Protein foods include: Meat, fish, poultry, eggs, dairy foods, and beans such as pinto and kidney beans (beans also provide carbohydrate).   1. Eat at least 3 meals and 1-2 snacks per day. Never go more than 4-5 hours while awake without eating.  2. Limit starchy foods to TWO per meal and ONE per snack. ONE portion of a starchy  food is equal to the following:   - ONE slice of bread (or its equivalent, such as half of a hamburger bun).   - 1/2 cup of a "scoopable" starchy food such as potatoes or rice.   - 15 grams of carbohydrate as shown on food label.  3. Both lunch and dinner should include a protein food, a carb food, and vegetables.   - Obtain twice as many veg's as protein or carbohydrate foods for both lunch and dinner.   - Fresh or frozen veg's are best.   - Try to keep frozen veg's on hand for a quick vegetable serving.    4. Breakfast should always include protein.

## 2017-02-06 NOTE — Progress Notes (Signed)
[facility] Phone: [phone]   Date of Visit: 02/06/2017   HPI:  DM2:  - patient was last seen in clinic for DM on 06/2016 - current Medication: Lantus 45 U at bedtime and Novolog 10 units with biggest meal (at dinner).  - Patient reports he only checks cbgs when he feels that his sugar is low. Reports that he has symptoms of hypoglycemia about 3 times in the past month. Reports that this occurred even with lower doses of Lantus. He is able to quickly bring up his cbg.  - he last checked cbg this morning and it was 89 (he felt low). Yesterday morning it was 119. He reports his lows can happed any time through out the day.  - He does not eat an actual meal during the day. He reports that he might have some peanuts as a snack during the day. He is trying to avoid getting unhealthy options from the vending machine. We talked about the possibility of making a meal at home to take to work. We discussed the importance of eating regularly throughout the day to avoid hypoglycemia. Patient is interested in seeing a nutritionist.  - when asked about barriers preventing him from checking his cbgs regularly, he reports it is due to his fingers felling sore with multiple checks. We talked about ways to possibly minimize the discomfort when checking.   Onychomycosis:  - patient has had onychomycosis of all toes in bilateral feet for a while.  - patient was initially interested in possible treatment with oral antifungal, but did not have insurance at that time - we discussed the risks and benefits of oral antifungal today. We decided on referral to podiatry first for trimming.   Of note, patient's nephrologist requested the following lab work to be done today (as ne did not have insurance when he last saw her and was not able to get labwork done): CMP, CBC with diff, Prograf trough, A1c, lipid, UA. Requested that result   ROS: See HPI.  PMFSH:  PMH: DM2 HTN HLD Hx of Renal  Transplant Onychomycosis of Toenail  PHYSICAL EXAM: BP 112/68   Pulse 77   Temp 98.5 F (36.9 C) (Oral)   Ht 5\' 6"  (1.676 m)   Wt 174 lb (78.9 kg)   SpO2 96%   BMI 28.08 kg/m  Gen: NAD, pleasant  Skin: onychomycosis of all toe nails in both feet with long toe nails Neuro: alert, oriented, no gross focal deficits  ASSESSMENT/PLAN:  Diabetes mellitus following renal transplant A1c is still uncontrolled. We had a long discussion about the importance of checking cbgs regularly as I am unable to change insulin doses without it. Patient agreed to check cbgs TID and report to me in 1 week (via phone or MyChart). Then follow up in clinic in 2 weeks. We discussed the importance of eating regularly throughout the day to avoid hypoglycemia. Patient is interested in seeing a nutritionist. Referral made to [facility] and contact information given for Dr. Gerilyn PilgrimSykes.   Onychomycosis of toenail We discussed the risks and benefits of oral antifungals. With history of renal transplant, I would prefer to avoid this. We decided a referral to podiatry at least for clipping for now. Discussed with attending; can try second line topical therapy but I am not sure how effective this will be.   Will [facility] at [phone]  Palma HolterKanishka G Danniell Rotundo, MD PGY 2 [facility]

## 2017-02-07 ENCOUNTER — Telehealth: Payer: Self-pay | Admitting: Internal Medicine

## 2017-02-07 NOTE — Telephone Encounter (Signed)
Attempted to contact pt and schedule lab only visit, however, no answer. A voice mail was left for pt to call the office to schedule a lab only visit.

## 2017-02-07 NOTE — Telephone Encounter (Signed)
Please call Mr. Adam Davis to remind him that he needs to come in to get his blood work for lipid panel (future order) (this must be fasting ideally). Also please report to him that I will add another blood test CMP to this as I forgot to add this yesterday and this is one of the other tests his nephrologist wanted. So, it is very important that he comes to get this done sooner rather than later. Will order future CMP.

## 2017-02-08 LAB — CBC WITH DIFFERENTIAL/PLATELET
BASOS: 0 %
Basophils Absolute: 0 10*3/uL (ref 0.0–0.2)
EOS (ABSOLUTE): 0.1 10*3/uL (ref 0.0–0.4)
EOS: 2 %
HEMATOCRIT: 41.5 % (ref 37.5–51.0)
Hemoglobin: 14.5 g/dL (ref 13.0–17.7)
IMMATURE GRANULOCYTES: 1 %
Immature Grans (Abs): 0.1 10*3/uL (ref 0.0–0.1)
LYMPHS ABS: 2 10*3/uL (ref 0.7–3.1)
Lymphs: 27 %
MCH: 25.3 pg — ABNORMAL LOW (ref 26.6–33.0)
MCHC: 34.9 g/dL (ref 31.5–35.7)
MCV: 73 fL — AB (ref 79–97)
Monocytes Absolute: 0.5 10*3/uL (ref 0.1–0.9)
Monocytes: 7 %
NEUTROS ABS: 4.8 10*3/uL (ref 1.4–7.0)
NEUTROS PCT: 63 %
Platelets: 171 10*3/uL (ref 150–379)
RBC: 5.72 x10E6/uL (ref 4.14–5.80)
RDW: 14.8 % (ref 12.3–15.4)
WBC: 7.5 10*3/uL (ref 3.4–10.8)

## 2017-02-08 LAB — TACROLIMUS LEVEL: Tacrolimus Lvl: 3.4 ng/mL (ref 2.0–20.0)

## 2017-02-08 NOTE — Assessment & Plan Note (Addendum)
A1c is still uncontrolled. We had a long discussion about the importance of checking cbgs regularly as I am unable to change insulin doses without it. Patient agreed to check cbgs TID and report to me in 1 week (via phone or MyChart). Then follow up in clinic in 2 weeks. We discussed the importance of eating regularly throughout the day to avoid hypoglycemia. Patient is interested in seeing a nutritionist. Referral made to [facility] and contact information given for Dr. Gerilyn PilgrimSykes.

## 2017-02-08 NOTE — Assessment & Plan Note (Signed)
We discussed the risks and benefits of oral antifungals. With history of renal transplant, I would prefer to avoid this. We decided a referral to podiatry at least for clipping for now. Discussed with attending; can try second line topical therapy but I am not sure how effective this will be.

## 2017-02-13 ENCOUNTER — Telehealth: Payer: Self-pay | Admitting: Internal Medicine

## 2017-02-13 ENCOUNTER — Other Ambulatory Visit: Payer: Self-pay | Admitting: *Deleted

## 2017-02-13 DIAGNOSIS — E1165 Type 2 diabetes mellitus with hyperglycemia: Principal | ICD-10-CM

## 2017-02-13 DIAGNOSIS — Z794 Long term (current) use of insulin: Principal | ICD-10-CM

## 2017-02-13 DIAGNOSIS — E1122 Type 2 diabetes mellitus with diabetic chronic kidney disease: Secondary | ICD-10-CM

## 2017-02-13 MED ORDER — INSULIN ASPART 100 UNIT/ML ~~LOC~~ SOLN: SUBCUTANEOUS | 2 refills | Status: DC

## 2017-02-13 NOTE — Telephone Encounter (Signed)
Attempted to call patient to review cbgs. Went to voicemail. Left message to call back.  

## 2017-02-15 NOTE — Telephone Encounter (Signed)
Ok to provide samples if we have any?

## 2017-02-15 NOTE — Telephone Encounter (Signed)
Yes please provide samples. I would ask Dr. Raymondo BandKoval. Can you please call [facility] to see if there are prescriptions there for him. I believe I resent refills during his last visit.

## 2017-02-15 NOTE — Telephone Encounter (Signed)
Pt called about his labs.  He also has run out insulin. He hasnt had any in 2 days. [facility] hasnt provided any yet. Can he get any samples?

## 2017-02-15 NOTE — Telephone Encounter (Signed)
LM for patient asking him to call back.  Please inform him that we don't have any Lantus insulin but we do have novolog.  If he is ok with getting only this sample I will put one aside with his name on it. I also need to know if he uses the pen or the vial.  Per his chart it looks like the vial of novolog.  Gerline Ratto,CMA

## 2017-02-15 NOTE — Telephone Encounter (Signed)
LM for patient to call back.  Spoke with [facility] at [facility] and his insulin is ready for pick up.  They have also tried calling him today with no return call.  Please let him know he can go by there and pick this up when/if he calls back. Adam Davis,CMA

## 2017-02-27 ENCOUNTER — Telehealth: Payer: Self-pay | Admitting: Internal Medicine

## 2017-02-27 NOTE — Telephone Encounter (Signed)
Patient never called to review CBGs. Attempted to call patient but went to voicemail. Left Message to call back.

## 2017-03-01 ENCOUNTER — Telehealth: Payer: Self-pay | Admitting: Internal Medicine

## 2017-03-01 NOTE — Telephone Encounter (Signed)
Opened in error

## 2017-04-10 ENCOUNTER — Other Ambulatory Visit: Payer: Self-pay | Admitting: *Deleted

## 2017-04-10 DIAGNOSIS — Z794 Long term (current) use of insulin: Principal | ICD-10-CM

## 2017-04-10 DIAGNOSIS — E1165 Type 2 diabetes mellitus with hyperglycemia: Principal | ICD-10-CM

## 2017-04-10 DIAGNOSIS — E1122 Type 2 diabetes mellitus with diabetic chronic kidney disease: Secondary | ICD-10-CM

## 2017-04-10 MED ORDER — INSULIN GLARGINE 100 UNIT/ML ~~LOC~~ SOLN: 45.0000 [IU] | Freq: Every day | SUBCUTANEOUS | 2 refills | Status: DC

## 2017-04-10 NOTE — Telephone Encounter (Signed)
Attempted to call patient to review his cbgs. Went to [employer]. Left message asking to call back as we have not reviewed his sugars since his last visit. Our plan was to discuss his sugars over the phone a week after his last visit but this  did not occur as they could not get in touch with him and he has not called the clinic back. Will still refill his Lantus

## 2017-05-21 ENCOUNTER — Other Ambulatory Visit: Payer: Self-pay | Admitting: *Deleted

## 2017-05-21 DIAGNOSIS — Z794 Long term (current) use of insulin: Principal | ICD-10-CM

## 2017-05-21 DIAGNOSIS — E1165 Type 2 diabetes mellitus with hyperglycemia: Principal | ICD-10-CM

## 2017-05-21 DIAGNOSIS — E1122 Type 2 diabetes mellitus with diabetic chronic kidney disease: Secondary | ICD-10-CM

## 2017-05-23 MED ORDER — INSULIN GLARGINE 100 UNIT/ML ~~LOC~~ SOLN: 45.0000 [IU] | Freq: Every day | SUBCUTANEOUS | 2 refills | Status: DC

## 2017-05-23 MED ORDER — INSULIN ASPART 100 UNIT/ML ~~LOC~~ SOLN: SUBCUTANEOUS | 2 refills | Status: DC

## 2017-05-23 NOTE — Telephone Encounter (Signed)
Please inform patient that he should make a follow up appointment for diabetes. Please ask him to keep a detailed diary of his cbgs so we can appropriately adjust his insulin

## 2017-05-24 ENCOUNTER — Telehealth: Payer: Self-pay | Admitting: *Deleted

## 2017-05-24 DIAGNOSIS — Z794 Long term (current) use of insulin: Principal | ICD-10-CM

## 2017-05-24 DIAGNOSIS — E1165 Type 2 diabetes mellitus with hyperglycemia: Principal | ICD-10-CM

## 2017-05-24 DIAGNOSIS — E1122 Type 2 diabetes mellitus with diabetic chronic kidney disease: Secondary | ICD-10-CM

## 2017-05-24 NOTE — Telephone Encounter (Signed)
Patient scheduled for 10-10/18. Khalon Cansler,CMA

## 2017-05-24 NOTE — Telephone Encounter (Signed)
Received message on nurse line from [facility] with [employer] [phone]) requesting specific dosing instructions for novolog insulin. That way they can ensure patient receives enough insulin from company. Kinnie Feil, RN, BSN

## 2017-05-25 ENCOUNTER — Telehealth: Payer: Self-pay | Admitting: Internal Medicine

## 2017-05-25 MED ORDER — INSULIN ASPART 100 UNIT/ML ~~LOC~~ SOLN: SUBCUTANEOUS | 0 refills | Status: DC

## 2017-05-25 NOTE — Telephone Encounter (Signed)
I resent the prescription with what he mentioned he was taking for novolog. This patient does not follow up regularly for his DM2. I attempted to call him but went to voicemail. Could you please see if you are able to get in touch with him. He needs to have a follow up appointment with me. He also needs to bring his sugar log so we can appropriately adjust the insulin. (he has not been bringing this and I have not been able to make any changes). Thank  You.

## 2017-05-25 NOTE — Telephone Encounter (Signed)
Opened in error

## 2017-05-25 NOTE — Telephone Encounter (Signed)
Message left on VM requesting patient return call to schedule f/u appt with PCP. Please remind patient to bring glucose log. Kinnie Feil, RN, BSN

## 2017-06-06 ENCOUNTER — Ambulatory Visit: Payer: BLUE CROSS/BLUE SHIELD | Admitting: Internal Medicine

## 2017-06-06 NOTE — Progress Notes (Deleted)
   [facility] Phone: [phone]   Date of Visit: 06/06/2017   HPI:  ***  ROS: See HPI.  PMFSH: ***  PHYSICAL EXAM: There were no vitals taken for this visit. Gen: *** HEENT: *** Heart: *** Lungs: *** Neuro: *** Ext: ***  ASSESSMENT/PLAN:  Health maintenance:  -***  No problem-specific Assessment & Plan notes found for this encounter.  FOLLOW UP: Follow up in *** for ***  Monnie Gudgel G Mckinzi Eriksen, MD PGY 2 [facility]  

## 2017-06-08 ENCOUNTER — Telehealth: Payer: Self-pay | Admitting: Internal Medicine

## 2017-06-08 NOTE — Telephone Encounter (Signed)
Attempted to call patient to discuss cbgs, but no answer and no voicemail.

## 2017-06-26 ENCOUNTER — Telehealth: Payer: Self-pay | Admitting: *Deleted

## 2017-06-26 ENCOUNTER — Other Ambulatory Visit: Payer: Self-pay | Admitting: Internal Medicine

## 2017-06-26 MED ORDER — INSULIN ASPART 100 UNIT/ML ~~LOC~~ SOLN
SUBCUTANEOUS | 0 refills | Status: DC
Start: 2017-06-26 — End: 2017-06-28

## 2017-06-26 NOTE — Telephone Encounter (Signed)
error 

## 2017-06-27 NOTE — Progress Notes (Signed)
   [facility] Phone: [phone]   Date of Visit: 06/28/2017   HPI:  DM2:  -Patient reports that he was recently in jail and that is probably the reason that he did not get my phone messages. -Medications:Lantus 40units at bedtime, Novolog 5 unit with meal usually dinner.  -Reports that the above dosing of insulin was provided by the provider in prison. -He reports that while in prison his sugars were between  140-200 -When asked about how his sugars have been since he was home he reports that he last checked his sugar yesterday morning and it was 120-125.  We discussed the importance of checking his sugars regularly and multiple times throughout the day as we discussed in prior visits.  We discussed the importance of getting better control of his diabetes shortly since he is a renal transplant recipient. -Reports he needs new prescription for diabetic supplies.  He could not find the last prescription I had printed for him.  After I left the room he informed the nurse that he would like to get a referral to podiatry for long toenails.   ROS: See HPI.  PMFSH:  PMH: DM2 HTN HLD Hx of Renal Transplant Onychomycosis of Toenail  PHYSICAL EXAM: BP 120/75 (Cuff Size: Normal)   Pulse 72   Temp 97.6 F (36.4 C) (Oral)   Ht 5\' 6"  (1.676 m)   Wt 170 lb 6.4 oz (77.3 kg)   SpO2 97%   BMI 27.50 kg/m  Gen: MAD, pleasant Psych: mood and affect appropriate.   ASSESSMENT/PLAN:  Health maintenance:  -Declined flu vaccine today.  Diabetes mellitus following renal transplant His A1c today is 10.3 which is the same as his A1c in June.  We discussed the importance of better control of his sugars.  I discussed that it is difficult and dangerous for me to make any changes to his insulin without having a record of his CBGs.  Patient agreed to keep a record of his CBGs and take CBGs at least before breakfast in the morning, before big meals.  I have also asked him to do  a 2-hour postprandial check after dinner on some days.  He is to call me with his sugars in a week (next Friday.).  I asked him to make a follow-up visit with Dr. Raymondo BandKoval for diabetes management in about 2 weeks.   Adam HolterKanishka G Ananda Sitzer, MD PGY 3 [facility]

## 2017-06-28 ENCOUNTER — Ambulatory Visit (INDEPENDENT_AMBULATORY_CARE_PROVIDER_SITE_OTHER): Payer: BLUE CROSS/BLUE SHIELD | Admitting: Internal Medicine

## 2017-06-28 VITALS — BP 120/75 | HR 72 | Temp 97.6°F | Ht 66.0 in | Wt 170.4 lb

## 2017-06-28 DIAGNOSIS — Z940 Kidney transplant status: Secondary | ICD-10-CM

## 2017-06-28 DIAGNOSIS — B351 Tinea unguium: Secondary | ICD-10-CM

## 2017-06-28 DIAGNOSIS — E139 Other specified diabetes mellitus without complications: Secondary | ICD-10-CM

## 2017-06-28 LAB — POCT GLYCOSYLATED HEMOGLOBIN (HGB A1C): Hemoglobin A1C: 10.3

## 2017-06-28 MED ORDER — INSULIN ASPART 100 UNIT/ML ~~LOC~~ SOLN: SUBCUTANEOUS | 0 refills | Status: DC

## 2017-06-28 MED ORDER — AGAMATRIX ULTRA-THIN LANCETS MISC: 0 refills | Status: DC

## 2017-06-28 MED ORDER — INSULIN GLARGINE 100 UNIT/ML ~~LOC~~ SOLN: 40.0000 [IU] | Freq: Every day | SUBCUTANEOUS | 2 refills | Status: DC

## 2017-06-28 MED ORDER — AGAMATRIX PRESTO PRO METER DEVI: 0 refills | Status: DC

## 2017-06-28 MED ORDER — GLUCOSE BLOOD VI STRP: ORAL_STRIP | 12 refills | Status: DC

## 2017-06-28 NOTE — Assessment & Plan Note (Signed)
His A1c today is 10.3 which is the same as his A1c in June.  We discussed the importance of better control of his sugars.  I discussed that it is difficult and dangerous for me to make any changes to his insulin without having a record of his CBGs.  Patient agreed to keep a record of his CBGs and take CBGs at least before breakfast in the morning, before big meals.  I have also asked him to do a 2-hour postprandial check after dinner on some days.  He is to call me with his sugars in a week (next Friday.).  I asked him to make a follow-up visit with Dr. Raymondo BandKoval for diabetes management in about 2 weeks.

## 2017-06-28 NOTE — Patient Instructions (Signed)
I have sent your insulin prescription as well as a meter to the map program. Please check your sugars in the morning, before a big meal, and 2 hours after your biggest meal.  Please keep a written record of your sugars. Please call me in 1 week, next Friday will work to review your sugars. Please make an appointment with Dr. Raymondo BandKoval our pharmacist in 2 weeks

## 2017-06-29 LAB — BASIC METABOLIC PANEL
BUN/Creatinine Ratio: 13 (ref 9–20)
BUN: 26 mg/dL — ABNORMAL HIGH (ref 6–24)
CALCIUM: 9.1 mg/dL (ref 8.7–10.2)
CHLORIDE: 96 mmol/L (ref 96–106)
CO2: 24 mmol/L (ref 20–29)
Creatinine, Ser: 1.95 mg/dL — ABNORMAL HIGH (ref 0.76–1.27)
GFR, EST AFRICAN AMERICAN: 48 mL/min/{1.73_m2} — AB (ref >59)
GFR, EST NON AFRICAN AMERICAN: 41 mL/min/{1.73_m2} — AB (ref >59)
Glucose: 411 mg/dL — ABNORMAL HIGH (ref 65–99)
POTASSIUM: 4.6 mmol/L (ref 3.5–5.2)
SODIUM: 133 mmol/L — AB (ref 134–144)

## 2017-07-09 ENCOUNTER — Emergency Department (HOSPITAL_COMMUNITY)
Admission: EM | Admit: 2017-07-09 | Discharge: 2017-07-09 | Disposition: A | Discharge status: Home / Self Care | Payer: BLUE CROSS/BLUE SHIELD | Attending: Emergency Medicine | Admitting: Emergency Medicine

## 2017-07-09 ENCOUNTER — Other Ambulatory Visit: Payer: Self-pay

## 2017-07-09 ENCOUNTER — Encounter (HOSPITAL_COMMUNITY): Payer: Self-pay | Admitting: *Deleted

## 2017-07-09 DIAGNOSIS — R9431 Abnormal electrocardiogram [ECG] [EKG]: Secondary | ICD-10-CM | POA: Diagnosis not present

## 2017-07-09 DIAGNOSIS — E875 Hyperkalemia: Secondary | ICD-10-CM | POA: Insufficient documentation

## 2017-07-09 DIAGNOSIS — E785 Hyperlipidemia, unspecified: Secondary | ICD-10-CM | POA: Diagnosis not present

## 2017-07-09 DIAGNOSIS — E119 Type 2 diabetes mellitus without complications: Secondary | ICD-10-CM | POA: Diagnosis not present

## 2017-07-09 DIAGNOSIS — Z79899 Other long term (current) drug therapy: Secondary | ICD-10-CM | POA: Insufficient documentation

## 2017-07-09 DIAGNOSIS — R109 Unspecified abdominal pain: Secondary | ICD-10-CM

## 2017-07-09 DIAGNOSIS — R1084 Generalized abdominal pain: Secondary | ICD-10-CM | POA: Insufficient documentation

## 2017-07-09 DIAGNOSIS — Z794 Long term (current) use of insulin: Secondary | ICD-10-CM | POA: Diagnosis not present

## 2017-07-09 DIAGNOSIS — I10 Essential (primary) hypertension: Secondary | ICD-10-CM | POA: Diagnosis not present

## 2017-07-09 LAB — CBC WITH DIFFERENTIAL/PLATELET
Basophils Absolute: 0 10*3/uL (ref 0.0–0.1)
Basophils Relative: 0 %
EOS PCT: 2 %
Eosinophils Absolute: 0.1 10*3/uL (ref 0.0–0.7)
HCT: 42.4 % (ref 39.0–52.0)
Hemoglobin: 14.5 g/dL (ref 13.0–17.0)
LYMPHS ABS: 1.2 10*3/uL (ref 0.7–4.0)
LYMPHS PCT: 17 %
MCH: 25.7 pg — AB (ref 26.0–34.0)
MCHC: 34.2 g/dL (ref 30.0–36.0)
MCV: 75 fL — AB (ref 78.0–100.0)
MONO ABS: 0.4 10*3/uL (ref 0.1–1.0)
Monocytes Relative: 6 %
Neutro Abs: 5.1 10*3/uL (ref 1.7–7.7)
Neutrophils Relative %: 75 %
PLATELETS: 149 10*3/uL — AB (ref 150–400)
RBC: 5.65 MIL/uL (ref 4.22–5.81)
RDW: 12.9 % (ref 11.5–15.5)
WBC: 6.8 10*3/uL (ref 4.0–10.5)

## 2017-07-09 LAB — BASIC METABOLIC PANEL
Anion gap: 4 — ABNORMAL LOW (ref 5–15)
BUN: 19 mg/dL (ref 6–20)
CHLORIDE: 101 mmol/L (ref 101–111)
CO2: 25 mmol/L (ref 22–32)
Calcium: 8.8 mg/dL — ABNORMAL LOW (ref 8.9–10.3)
Creatinine, Ser: 1.75 mg/dL — ABNORMAL HIGH (ref 0.61–1.24)
GFR calc Af Amer: 54 mL/min — ABNORMAL LOW (ref >60)
GFR calc non Af Amer: 46 mL/min — ABNORMAL LOW (ref >60)
GLUCOSE: 445 mg/dL — AB (ref 65–99)
POTASSIUM: 5.2 mmol/L — AB (ref 3.5–5.1)
Sodium: 130 mmol/L — ABNORMAL LOW (ref 135–145)

## 2017-07-09 LAB — URINALYSIS, ROUTINE W REFLEX MICROSCOPIC
Bacteria, UA: NONE SEEN
Bilirubin Urine: NEGATIVE
Ketones, ur: NEGATIVE mg/dL
Leukocytes, UA: NEGATIVE
NITRITE: NEGATIVE
PH: 5 (ref 5.0–8.0)
Protein, ur: 100 mg/dL — AB
SPECIFIC GRAVITY, URINE: 1.017 (ref 1.005–1.030)
Squamous Epithelial / LPF: NONE SEEN

## 2017-07-09 MED ORDER — MYCOPHENOLATE MOFETIL 250 MG PO CAPS
500.0000 mg | ORAL_CAPSULE | Freq: Once | ORAL | Status: AC
  Administered 2017-07-09: 500 mg via ORAL
  Filled 2017-07-09: qty 2

## 2017-07-09 MED ORDER — MYCOPHENOLATE MOFETIL 250 MG PO CAPS: 500.0000 mg | ORAL_CAPSULE | Freq: Two times a day (BID) | ORAL | 0 refills | Status: AC

## 2017-07-09 NOTE — Discharge Instructions (Signed)
Please fill the prescription for the CellCept tonight to begin taking it again.  Please make your nephrologist aware of the issue with the gap in your medication.  Return to the ED for worsening symptoms, fever/chills, vomiting, abdominal pain, swelling, shortness of breath, chest pain, or any other major concerns.

## 2017-07-09 NOTE — ED Provider Notes (Signed)
[facility] Note   CSN: [id number] Arrival date & time: 07/09/17  1343     History   Chief Complaint Chief Complaint  Patient presents with  . Flank Pain    HPI Adam SproutMarvin T Davis is a 41 y.o. male.  HPI   Adam SproutMarvin T Davis is a 41 y.o. male, with a history of DM, left kidney transplant, HTN, presenting to the ED with left midback and flank pain for the last two days. Pain is cramping, 3-4/10, intermittent, nonradiating. Took ibuprofen 200mg  without relief.  He states, "The discomfort is not really bothering me, it is that I am having discomfort and have not been able to take my CellCept." Has been out of his Cellcept for last four days due to an issue with changing from a special medication program through [facility] to a insurance based program. States his insurance can only fill this medication via mail order.  [facility]. Nephrologist: Marjory SneddonKelly Goldsborough. States he tried to contact Dr. Jon GillsGoldsborough's office, but they are closed today.  Denies fever/chills, N/V/D, hematuria, dysuria, difficulty urinating, abdominal pain, CP, SOB, peripheral edema, or any other complaints.     Past Medical History:  Diagnosis Date  . Diabetes mellitus   . H/O acute post-streptococcal glomerulonephritis 1997  . History of renal transplant   . Hyperlipidemia   . Hypertension   . Onychomycosis of toenail   . Renal disorder   . Tachycardia     Patient Active Problem List   Diagnosis Date Noted  . Essential hypertension, benign 10/30/2012  . HLD (hyperlipidemia) 08/08/2012  . Onychomycosis of toenail 07/04/2012  . 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/2012  . History of renal transplant 05/22/2012    Past Surgical History:  Procedure Laterality Date  . ANKLE SURGERY    . AV FISTULA PLACEMENT  1997   Removed after transplant in 2006  . KIDNEY SURGERY  2006       Home  Medications    Prior to Admission medications   Medication Sig Start Date End Date Taking? Authorizing Provider  ibuprofen (ADVIL,MOTRIN) 200 MG tablet Take 200 mg every 6 (six) hours as needed by mouth for mild pain.   Yes [provider]  insulin aspart (NOVOLOG) 100 UNIT/ML injection Take 5 units of novolog with the biggest meal of the day. Patient taking differently: Inject 5 Units daily into the skin. AFTER THE LARGEST MEAL OF THE DAY 06/28/17  Yes Palma HolterGunadasa, Kanishka G, MD  insulin glargine (LANTUS) 100 UNIT/ML injection Inject 0.4 mLs (40 Units total) into the skin at bedtime. 06/28/17  Yes Palma HolterGunadasa, Kanishka G, MD  metoprolol succinate (TOPROL-XL) 50 MG 24 hr tablet Take 50 mg by mouth daily. Take with or immediately following a meal.   Yes [provider]  multivitamin (ONE-A-DAY MEN'S) TABS tablet Take 1 tablet daily by mouth.   Yes [provider]  mycophenolate (CELLCEPT) 250 MG capsule Take 500 mg by mouth 2 (two) times daily.    Yes [provider]  tacrolimus (PROGRAF) 1 MG capsule Take 1 capsule (1 mg total) by mouth 2 (two) times daily. 10/08/13  Yes Glori LuisSonnenberg, Eric G, MD  Whey Protein POWD Take 1 Scoop See admin instructions by mouth. 1 scoop dissolved in 4 ounces of water once a day (DRINK)   Yes [provider]  Chad CordialAGAMATRIX ULTRA-THIN LANCETS MISC Check sugars three times a day 06/28/17   Palma HolterGunadasa, Kanishka G, MD  atorvastatin (LIPITOR) 40 MG tablet Take 1 tablet (40 mg total) by mouth daily. Patient not taking: Reported on 07/09/2017 07/13/16   Palma Holter, MD  Blood Glucose Monitoring Suppl (AGAMATRIX PRESTO PRO METER) DEVI Use to check sugars: AM, before meal. 2 hours after dinner. 06/28/17   Palma Holter, MD  glucose blood (AGAMATRIX PRESTO TEST) test strip Check sugars three times a day 06/28/17   Palma Holter, MD  Phenyleph-Promethazine-Cod 5-6.25-10 MG/5ML SYRP Take 5 mLs by mouth 2 (two) times daily as  needed. Patient not taking: Reported on 07/09/2017 09/14/15   Janne Napoleon, NP    Family History Family History  Problem Relation Age of Onset  . Diabetes Brother   . Diabetes Maternal Grandmother   . Hypertension Maternal Grandmother     Social History Social History   Tobacco Use  . Smoking status: Never Smoker  . Smokeless tobacco: Never Used  Substance Use Topics  . Alcohol use: No  . Drug use: No     Allergies   Patient has no known allergies.   Review of Systems Review of Systems  Constitutional: Negative for activity change, appetite change, chills and fever.  Respiratory: Negative for shortness of breath.   Cardiovascular: Negative for chest pain.  Gastrointestinal: Negative for abdominal pain, diarrhea, nausea and vomiting.  Genitourinary: Positive for flank pain. Negative for decreased urine volume, difficulty urinating, dysuria and hematuria.  Neurological: Negative for weakness.  All other systems reviewed and are negative.    Physical Exam Updated Vital Signs BP (!) 126/96 (BP Location: Right Arm)   Pulse 80   Temp 99.1 F (37.3 C) (Oral)   Resp 16   SpO2 97%   Physical Exam  Constitutional: He appears well-developed and well-nourished. No distress.  HENT:  Head: Normocephalic and atraumatic.  Eyes: Conjunctivae are normal.  Neck: Neck supple.  Cardiovascular: Normal rate, regular rhythm, normal heart sounds and intact distal pulses.  Pulmonary/Chest: Effort normal and breath sounds normal. No respiratory distress.  Abdominal: Soft. There is no tenderness. There is no guarding and no CVA tenderness.  Musculoskeletal: He exhibits no edema.  Lymphadenopathy:    He has no cervical adenopathy.  Neurological: He is alert.  Skin: Skin is warm and dry. Capillary refill takes less than 2 seconds. He is not diaphoretic.  Psychiatric: He has a normal mood and affect. His behavior is normal.  Nursing note and vitals reviewed.    ED Treatments /  Results  Labs (all labs ordered are listed, but only abnormal results are displayed) Labs Reviewed  URINALYSIS, ROUTINE W REFLEX MICROSCOPIC - Abnormal; Notable for the following components:      Result Value   Glucose, UA >=500 (*)    Hgb urine dipstick SMALL (*)    Protein, ur 100 (*)    All other components within normal limits  BASIC METABOLIC PANEL - Abnormal; Notable for the following components:   Sodium 130 (*)    Potassium 5.2 (*)    Glucose, Bld 445 (*)    Creatinine, Ser 1.75 (*)    Calcium 8.8 (*)    GFR calc non Af Amer 46 (*)    GFR calc Af Amer 54 (*)    Anion gap 4 (*)    All other components within normal limits  CBC WITH DIFFERENTIAL/PLATELET - Abnormal; Notable for the following components:   MCV 75.0 (*)    MCH 25.7 (*)    Platelets 149 (*)    All  other components within normal limits    EKG  EKG Interpretation None       Radiology No results found.  Procedures Procedures (including critical care time)  Medications Ordered in ED Medications  mycophenolate (CELLCEPT) capsule 500 mg (500 mg Oral Given 07/09/17 1745)     Initial Impression / Assessment and Plan / ED Course  I have reviewed the triage vital signs and the nursing notes.  Pertinent labs & imaging results that were available during my care of the patient were reviewed by me and considered in my medical decision making (see chart for details).  Clinical Course as of Jul 09 1804  Mon Jul 09, 2017  1759 Appears to be consistent with previous values. Creatinine: (!) 1.75 [SJ]  1800 No EKG changes noted. Patient seems to be asymptomatic. Potassium: (!) 5.2 [SJ]  1803 Corrects to a normal level when hyperglycemia taken into account.  Sodium: (!) 130 [SJ]    Clinical Course User Index [SJ] Janda Cargo C, PA-C    Patient presents with flank pain after being out of his CellCept for the last 4 days. Patient is nontoxic appearing, afebrile, not tachycardic, not tachypneic, not  hypotensive, maintains excellent SPO2 on room air, and is in no apparent distress.  No signs of acute renal failure.  No leukocytosis. Coupon available on GoodRx coupon site for 20 capsules of CellCept for $7.60. Patient states he is comfortable with this.  States he will get this filled tonight. Offered treatment for his hyperglycemia and IV hydration. Patient states he is comfortable treating this at home and orally hydrating. He would prefer discharge.  The patient was given instructions for home care as well as return precautions. Patient voices understanding of these instructions, accepts the plan, and is comfortable with discharge.   Findings and plan of care discussed with Kristine RoyalPeter Messick, MD.   Vitals:   07/09/17 1413 07/09/17 1611 07/09/17 1725 07/09/17 1730  BP: (!) 130/91 (!) 126/96 131/85 133/90  Pulse: 86 80 77 87  Resp: 16 16 17 18   Temp: 98.3 F (36.8 C) 99.1 F (37.3 C)    TempSrc: Oral Oral    SpO2: 98% 97% 94% 96%     Final Clinical Impressions(s) / ED Diagnoses   Final diagnoses:  Flank pain  Hyperkalemia    ED Discharge Orders    None       Concepcion LivingJoy, Natally Ribera C, PA-C 07/09/17 1805    Wynetta FinesMessick, Peter C, MD 07/16/17 [phone]

## 2017-07-09 NOTE — ED Triage Notes (Signed)
Pt in c/o L flank pain, pt reports L kidney transplant in 2006, pt has not had anti rejection meds in 4 days, pt c/o L flank pain onset yesterday, denies fever & chills, pt denies changes in urine, denies urinary complications, A&O x4

## 2017-07-11 ENCOUNTER — Ambulatory Visit: Payer: BLUE CROSS/BLUE SHIELD | Admitting: Podiatry

## 2017-07-23 ENCOUNTER — Ambulatory Visit: Payer: BLUE CROSS/BLUE SHIELD | Admitting: Podiatry

## 2017-08-01 ENCOUNTER — Encounter: Payer: Self-pay | Admitting: Podiatry

## 2017-08-01 ENCOUNTER — Ambulatory Visit: Payer: BLUE CROSS/BLUE SHIELD | Admitting: Podiatry

## 2017-08-01 VITALS — BP 139/97 | HR 69 | Ht 66.0 in | Wt 170.0 lb

## 2017-08-01 DIAGNOSIS — Z79899 Other long term (current) drug therapy: Secondary | ICD-10-CM

## 2017-08-01 DIAGNOSIS — B351 Tinea unguium: Secondary | ICD-10-CM

## 2017-08-01 LAB — HEPATIC FUNCTION PANEL
AG RATIO: 1.6 (calc) (ref 1.0–2.5)
ALKALINE PHOSPHATASE (APISO): 64 U/L (ref 40–115)
ALT: 23 U/L (ref 9–46)
AST: 20 U/L (ref 10–40)
Albumin: 3.8 g/dL (ref 3.6–5.1)
BILIRUBIN DIRECT: 0.1 mg/dL (ref 0.0–0.2)
BILIRUBIN INDIRECT: 0.3 mg/dL (ref 0.2–1.2)
BILIRUBIN TOTAL: 0.4 mg/dL (ref 0.2–1.2)
Globulin: 2.4 g/dL (calc) (ref 1.9–3.7)
Total Protein: 6.2 g/dL (ref 6.1–8.1)

## 2017-08-01 MED ORDER — TERBINAFINE HCL 250 MG PO TABS: 250.0000 mg | ORAL_TABLET | Freq: Every day | ORAL | 0 refills | Status: DC

## 2017-08-01 NOTE — Progress Notes (Signed)
   Subjective:    Patient ID: Adam Davis, male    DOB: 06/20/1976, 41 y.o.   MRN: [id number]  HPI  Chief Complaint  Patient presents with  . Nail Problem    bilateral elongated thickened toenails  . Diabetes       Review of Systems  All other systems reviewed and are negative.      Objective:   Physical Exam        Assessment & Plan:

## 2017-08-05 NOTE — Progress Notes (Signed)
   Subjective: 41 year old male with past medical history of diabetes mellitus presenting to the clinic today as a new patient for possible treatment and evaluation of fungal nails bilaterally 1 through 5 there is been present for the past 6+ months.  He reports associated thickening and discoloration of the nails.  He has had anything his treatment.  There are no modifying factors noted.  He reports history of the left kidney transplant.  Patient presents today for further treatment and evaluation.   Past Medical History:  Diagnosis Date  . Diabetes mellitus   . H/O acute post-streptococcal glomerulonephritis 1997  . History of renal transplant   . Hyperlipidemia   . Hypertension   . Onychomycosis of toenail   . Renal disorder   . Tachycardia     Objective: Physical Exam General: The patient is alert and oriented x3 in no acute distress.  Dermatology: Hyperkeratotic, discolored, thickened, onychodystrophy of nails noted bilaterally.  Skin is warm, dry and supple bilateral lower extremities. Negative for open lesions or macerations.  Vascular: Palpable pedal pulses bilaterally. No edema or erythema noted. Capillary refill within normal limits.  Neurological: Epicritic and protective threshold grossly intact bilaterally.   Musculoskeletal Exam: Range of motion within normal limits to all pedal and ankle joints bilateral. Muscle strength 5/5 in all groups bilateral.   Assessment: #1  Onychomycosis bilaterally  Plan of Care:  1. Patient was evaluated. 2.  Prescription for Lamisil 250 mg #90 given the patient. 3.  Orders placed for liver function test.  We will contact patient regarding results. 4.  Appointment with Shanda BumpsJessica for laser treatment. 5.  Return to clinic in 5 months.   Felecia ShellingBrent M. Evans, DPM [facility]  Dr. Felecia ShellingBrent M. Evans, DPM    [address]                                        [place], [place] [address]                Office [phone]    Fax [phone]

## 2017-08-31 ENCOUNTER — Other Ambulatory Visit: Payer: BLUE CROSS/BLUE SHIELD

## 2017-09-13 DIAGNOSIS — Z940 Kidney transplant status: Secondary | ICD-10-CM | POA: Diagnosis not present

## 2017-09-13 DIAGNOSIS — I10 Essential (primary) hypertension: Secondary | ICD-10-CM | POA: Diagnosis not present

## 2017-09-13 DIAGNOSIS — E1129 Type 2 diabetes mellitus with other diabetic kidney complication: Secondary | ICD-10-CM | POA: Diagnosis not present

## 2017-09-13 DIAGNOSIS — E785 Hyperlipidemia, unspecified: Secondary | ICD-10-CM | POA: Diagnosis not present

## 2018-01-02 ENCOUNTER — Encounter: Payer: BLUE CROSS/BLUE SHIELD | Admitting: Podiatry

## 2018-01-09 NOTE — Progress Notes (Signed)
This encounter was created in error - please disregard.  This encounter was created in error - please disregard.

## 2018-02-12 DIAGNOSIS — Z940 Kidney transplant status: Secondary | ICD-10-CM | POA: Diagnosis not present

## 2018-02-12 DIAGNOSIS — I10 Essential (primary) hypertension: Secondary | ICD-10-CM | POA: Diagnosis not present

## 2018-02-12 DIAGNOSIS — E1129 Type 2 diabetes mellitus with other diabetic kidney complication: Secondary | ICD-10-CM | POA: Diagnosis not present

## 2018-02-12 DIAGNOSIS — E785 Hyperlipidemia, unspecified: Secondary | ICD-10-CM | POA: Diagnosis not present

## 2018-03-19 DIAGNOSIS — E1129 Type 2 diabetes mellitus with other diabetic kidney complication: Secondary | ICD-10-CM | POA: Diagnosis not present

## 2018-05-09 ENCOUNTER — Other Ambulatory Visit: Payer: Self-pay

## 2018-05-09 ENCOUNTER — Encounter (HOSPITAL_COMMUNITY): Payer: Self-pay

## 2018-05-09 ENCOUNTER — Emergency Department (HOSPITAL_COMMUNITY)
Admission: EM | Admit: 2018-05-09 | Discharge: 2018-05-09 | Disposition: A | Discharge status: Home / Self Care | Payer: BLUE CROSS/BLUE SHIELD | Attending: Emergency Medicine | Admitting: Emergency Medicine

## 2018-05-09 DIAGNOSIS — I10 Essential (primary) hypertension: Secondary | ICD-10-CM | POA: Diagnosis not present

## 2018-05-09 DIAGNOSIS — K0889 Other specified disorders of teeth and supporting structures: Secondary | ICD-10-CM | POA: Insufficient documentation

## 2018-05-09 DIAGNOSIS — E119 Type 2 diabetes mellitus without complications: Secondary | ICD-10-CM | POA: Diagnosis not present

## 2018-05-09 DIAGNOSIS — Z794 Long term (current) use of insulin: Secondary | ICD-10-CM | POA: Insufficient documentation

## 2018-05-09 MED ORDER — PENICILLIN V POTASSIUM 500 MG PO TABS: 500.0000 mg | ORAL_TABLET | Freq: Four times a day (QID) | ORAL | 0 refills | Status: AC

## 2018-05-09 MED ORDER — PENICILLIN V POTASSIUM 250 MG PO TABS
500.0000 mg | ORAL_TABLET | Freq: Once | ORAL | Status: AC
  Administered 2018-05-09: 500 mg via ORAL
  Filled 2018-05-09: qty 2

## 2018-05-09 MED ORDER — LIDOCAINE VISCOUS HCL 2 % MT SOLN: 15.0000 mL | OROMUCOSAL | 0 refills | Status: DC | PRN

## 2018-05-09 NOTE — ED Triage Notes (Signed)
Pt endorses left upper toothache x 2 days. Does not have a dentist. VSS

## 2018-05-09 NOTE — Discharge Instructions (Signed)
Return to ED for worsening symptoms, trouble breathing or trouble swallowing, neck pain, trouble opening mouth.

## 2018-05-09 NOTE — ED Provider Notes (Signed)
[facility] EMERGENCY DEPARTMENT Provider Note   CSN: [id number] Arrival date & time: 05/09/18  4098     History   Chief Complaint Chief Complaint  Patient presents with  . Dental Pain    HPI Adam Davis is a 42 y.o. male with a past medical history of diabetes, hypertension, status post renal transplant, who presents to ED for evaluation of 1 day history of left upper dental pain.  States that the tooth in the area broke off about a year ago.  Since then he has had intermittent pain.  Describes the pain as sharp, worse with eating.  Has tried ibuprofen with no improvement in his symptoms.  Denies any trouble breathing or trouble swallowing, trismus, drooling, fever, drainage from the site, facial swelling.  HPI  Past Medical History:  Diagnosis Date  . Diabetes mellitus   . H/O acute post-streptococcal glomerulonephritis 1997  . History of renal transplant   . Hyperlipidemia   . Hypertension   . Onychomycosis of toenail   . Renal disorder   . Tachycardia     Patient Active Problem List   Diagnosis Date Noted  . Essential hypertension, benign 10/30/2012  . HLD (hyperlipidemia) 08/08/2012  . Onychomycosis of toenail 07/04/2012  . 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/2012  . History of renal transplant 05/22/2012    Past Surgical History:  Procedure Laterality Date  . ANKLE SURGERY    . AV FISTULA PLACEMENT  1997   Removed after transplant in 2006  . KIDNEY SURGERY  2006        Home Medications    Prior to Admission medications   Medication Sig Start Date End Date Taking? Authorizing Provider  Chad Cordial ULTRA-THIN LANCETS MISC Check sugars three times a day 06/28/17   Palma Holter, MD  atorvastatin (LIPITOR) 40 MG tablet Take 1 tablet (40 mg total) by mouth daily. Patient not taking: Reported on 07/09/2017 07/13/16   Palma Holter, MD  Blood Glucose Monitoring Suppl (AGAMATRIX PRESTO PRO METER) DEVI  Use to check sugars: AM, before meal. 2 hours after dinner. 06/28/17   Palma Holter, MD  glucose blood (AGAMATRIX PRESTO TEST) test strip Check sugars three times a day 06/28/17   Palma Holter, MD  ibuprofen (ADVIL,MOTRIN) 200 MG tablet Take 200 mg every 6 (six) hours as needed by mouth for mild pain.    [provider]  insulin aspart (NOVOLOG) 100 UNIT/ML injection Take 5 units of novolog with the biggest meal of the day. Patient taking differently: Inject 5 Units daily into the skin. AFTER THE LARGEST MEAL OF THE DAY 06/28/17   Palma Holter, MD  insulin glargine (LANTUS) 100 UNIT/ML injection Inject 0.4 mLs (40 Units total) into the skin at bedtime. 06/28/17   Palma Holter, MD  lidocaine (XYLOCAINE) 2 % solution Use as directed 15 mLs in the mouth or throat as needed for mouth pain. 05/09/18   Almeter Westhoff, PA-C  metoprolol succinate (TOPROL-XL) 50 MG 24 hr tablet Take 50 mg by mouth daily. Take with or immediately following a meal.    [provider]  multivitamin (ONE-A-DAY MEN'S) TABS tablet Take 1 tablet daily by mouth.    [provider]  mycophenolate (CELLCEPT) 250 MG capsule Take 500 mg by mouth 2 (two) times daily.     [provider]  penicillin v potassium (VEETID) 500 MG tablet Take 1 tablet (500 mg total) by mouth 4 (four) times daily for  7 days. 05/09/18 05/16/18  Devonna Oboyle, PA-C  Phenyleph-Promethazine-Cod 5-6.25-10 MG/5ML SYRP Take 5 mLs by mouth 2 (two) times daily as needed. Patient not taking: Reported on 07/09/2017 09/14/15   Janne NapoleonNeese, Hope M, NP  tacrolimus (PROGRAF) 1 MG capsule Take 1 capsule (1 mg total) by mouth 2 (two) times daily. 10/08/13   Glori LuisSonnenberg, Eric G, MD  terbinafine (LAMISIL) 250 MG tablet Take 1 tablet (250 mg total) by mouth daily. 08/01/17   Felecia ShellingEvans, Brent M, DPM  Whey Protein POWD Take 1 Scoop See admin instructions by mouth. 1 scoop dissolved in 4 ounces of water once a day (DRINK)    [provider]    Family History Family History  Problem Relation Age of Onset  . Diabetes Brother   . Diabetes Maternal Grandmother   . Hypertension Maternal Grandmother     Social History Social History   Tobacco Use  . Smoking status: Never Smoker  . Smokeless tobacco: Never Used  Substance Use Topics  . Alcohol use: No  . Drug use: No     Allergies   Patient has no known allergies.   Review of Systems Review of Systems  Constitutional: Negative for chills and fever.  HENT: Positive for dental problem. Negative for drooling, facial swelling, sneezing and sore throat.   Gastrointestinal: Negative for vomiting.  Musculoskeletal: Negative for neck pain and neck stiffness.     Physical Exam Updated Vital Signs BP (!) 143/107 (BP Location: Left Arm)   Pulse 72   Temp 98 F (36.7 C) (Oral)   Resp 16   Ht 5\' 6"  (1.676 m)   Wt 77.1 kg   SpO2 100%   BMI 27.44 kg/m   Physical Exam  Constitutional: He appears well-developed and well-nourished. No distress.  HENT:  Head: Normocephalic and atraumatic.  Mouth/Throat: Uvula is midline. He does not have dentures. No oral lesions. No trismus in the jaw. Abnormal dentition. Dental caries present. No dental abscesses, uvula swelling or lacerations.    Tenderness to palpation over the indicated missing tooth.  No gross dental abscess or site of drainage noted at this time. No facial, neck or cheek swelling noted. No pooling of secretions or trismus.  Normal voice noted with no difficulty swallowing or breathing.  No submandibular erythema, edema or crepitus noted.  Eyes: Conjunctivae and EOM are normal. No scleral icterus.  Neck: Normal range of motion.  Pulmonary/Chest: Effort normal. No respiratory distress.  Neurological: He is alert.  Skin: No rash noted. He is not diaphoretic.  Psychiatric: He has a normal mood and affect.  Nursing note and vitals reviewed.    ED Treatments / Results  Labs (all labs ordered  are listed, but only abnormal results are displayed) Labs Reviewed - No data to display  EKG None  Radiology No results found.  Procedures Procedures (including critical care time)  Medications Ordered in ED Medications  penicillin v potassium (VEETID) tablet 500 mg (has no administration in time range)     Initial Impression / Assessment and Plan / ED Course  I have reviewed the triage vital signs and the nursing notes.  Pertinent labs & imaging results that were available during my care of the patient were reviewed by me and considered in my medical decision making (see chart for details).     Patient with dentalgia. On exam, there is no evidence of a drainable abscess. No trismus, glossal elevation, unilateral tonsillar swelling. No evidence of retropharyngeal or peritonsillar abscess or Lawanda CousinsLudwig  angina. Will treat with  penicillin and lidocaine. Pt instructed to follow-up with dentist as soon as possible. Resource guide provided with AVS.  Portions of this note were generated with Scientist, clinical (histocompatibility and immunogenetics). Dictation errors may occur despite best attempts at proofreading.  Final Clinical Impressions(s) / ED Diagnoses   Final diagnoses:  Pain, dental    ED Discharge Orders         Ordered    penicillin v potassium (VEETID) 500 MG tablet  4 times daily     05/09/18 1031    lidocaine (XYLOCAINE) 2 % solution  As needed     05/09/18 [address], PA-C 05/09/18 1033    Alvira Monday, MD 05/09/18 2102

## 2018-05-15 ENCOUNTER — Other Ambulatory Visit: Payer: Self-pay

## 2018-05-15 ENCOUNTER — Emergency Department (HOSPITAL_COMMUNITY)
Admission: EM | Admit: 2018-05-15 | Discharge: 2018-05-15 | Disposition: A | Discharge status: Home / Self Care | Payer: BLUE CROSS/BLUE SHIELD | Attending: Emergency Medicine | Admitting: Emergency Medicine

## 2018-05-15 ENCOUNTER — Encounter (HOSPITAL_COMMUNITY): Payer: Self-pay

## 2018-05-15 DIAGNOSIS — E119 Type 2 diabetes mellitus without complications: Secondary | ICD-10-CM | POA: Diagnosis not present

## 2018-05-15 DIAGNOSIS — I10 Essential (primary) hypertension: Secondary | ICD-10-CM | POA: Diagnosis not present

## 2018-05-15 DIAGNOSIS — K047 Periapical abscess without sinus: Secondary | ICD-10-CM | POA: Diagnosis not present

## 2018-05-15 DIAGNOSIS — Z794 Long term (current) use of insulin: Secondary | ICD-10-CM | POA: Diagnosis not present

## 2018-05-15 DIAGNOSIS — Z79899 Other long term (current) drug therapy: Secondary | ICD-10-CM | POA: Diagnosis not present

## 2018-05-15 DIAGNOSIS — K0889 Other specified disorders of teeth and supporting structures: Secondary | ICD-10-CM | POA: Diagnosis not present

## 2018-05-15 MED ORDER — BUPIVACAINE-EPINEPHRINE (PF) 0.5% -1:200000 IJ SOLN
1.8000 mL | Freq: Once | INTRAMUSCULAR | Status: AC
  Administered 2018-05-15: 1.8 mL
  Filled 2018-05-15: qty 1.8

## 2018-05-15 MED ORDER — HYDROCODONE-ACETAMINOPHEN 5-325 MG PO TABS: 1.0000 | ORAL_TABLET | Freq: Four times a day (QID) | ORAL | 0 refills | Status: DC | PRN

## 2018-05-15 MED ORDER — CLINDAMYCIN HCL 150 MG PO CAPS: 300.0000 mg | ORAL_CAPSULE | Freq: Three times a day (TID) | ORAL | 0 refills | Status: AC

## 2018-05-15 MED ORDER — OXYCODONE-ACETAMINOPHEN 5-325 MG PO TABS
1.0000 | ORAL_TABLET | Freq: Once | ORAL | Status: AC
  Administered 2018-05-15: 1 via ORAL
  Filled 2018-05-15: qty 1

## 2018-05-15 NOTE — ED Triage Notes (Signed)
Pt seen here for dental abcess on9/12- rx for pcn and lidocaine- has been taking meds as prescribed. Swelling and pain remain.

## 2018-05-15 NOTE — Discharge Instructions (Addendum)
Please read and follow all provided instructions.  Your diagnoses today include:  1. Dental abscess     The exam and treatment you received today has been provided on an emergency basis only. This is not a substitute for complete medical or dental care. This problem will not resolve on its own without the care of a dentist.  Tests performed today include: Vital signs. See below for your results today.   Medications prescribed:   Take any prescribed medications only as directed.   For breakthrough pain you may take Norco. Do not drink alcohol drive or operate heavy machinery when taking. You are being provided a prescription for opiates (also known as narcotics) for pain control on an as needed basis.  Opiates can be addictive and should only be used when absolutely necessary for pain control when other alternatives do not work.  We recommend you only use them for the recommended amount of time and only as prescribed.  Please do not take with other sedative medications or alcohol.  Please do not drive, operate machinery, or make important decisions while taking opiates.  Please note that these medications can be addictive and have high abuse potential.  Please keep these medications locked away from children, teenagers or any family members with history of substance abuse. Additionally, these medications may cause constipation - take over the counter stool softeners or add fiber to your diet to treat this (Metamucil, Psyllium Fiber, Colace, Miralax) Further refills will need to be obtained from your primary care doctor and will not be prescribed through the Emergency Department. You will test positive on most drug tests while taking this medication.   Please take all of your antibiotics until finished!   You may develop abdominal discomfort or diarrhea from the antibiotic.  You may help offset this with probiotics which you can buy or get in yogurt. Do not eat or take the probiotics until 2 hours  after your antibiotic. Do not take your medicine if develop an itchy rash, swelling in your mouth or lips, or difficulty breathing.   Home care instructions:  Follow any educational materials contained in this packet.  Follow-up instructions: Please follow-up with your dentist for further evaluation of your symptoms.   Dental Assistance: See handout for dental referrals  Return instructions:  Please return to the Emergency Department if you experience worsening symptoms. Please return if you develop a fever, you develop more swelling in your face or neck, you have trouble breathing or swallowing food. Please return if you have any other emergent concerns.  Additional Information:  Your vital signs today were: BP (!) 136/105 (BP Location: Right Arm)    Pulse 78    Temp 98 F (36.7 C) (Oral)    Resp 18    Ht 5\' 6"  (1.676 m)    Wt 77.1 kg    SpO2 97%    BMI 27.44 kg/m  If your blood pressure (BP) was elevated above 135/85 this visit, please have this repeated by your doctor within one month. --------------

## 2018-05-15 NOTE — ED Provider Notes (Signed)
[facility] EMERGENCY DEPARTMENT Provider Note   CSN: [id number] Arrival date & time: 05/15/18  0935     History   Chief Complaint Chief Complaint  Patient presents with  . Dental Problem    HPI Adam Davis is a 42 y.o. male with a history of diabetes, hypertension, hyperlipidemia who presents emergency department today for dental pain.  Patient was seen here on 9/12 for the same.  He reports that he has been having approximately 8 days of left upper dental pain after a tooth broke off approximately 1 year ago.  He was discharged home on penicillin and viscous lidocaine and is been taking as prescribed.  He notes he has not made an appointment with the dentist.  He reports that he has had increased swelling along his gumline.  Is also been taking ibuprofen for symptoms without any relief. Denies fever, chills, voice change, trismus, inability to control secretions, nausea/vomiting, facial swelling, neck swelling, dysphagia, odynophagia, drainage or trauma  HPI  Past Medical History:  Diagnosis Date  . Diabetes mellitus   . H/O acute post-streptococcal glomerulonephritis 1997  . History of renal transplant   . Hyperlipidemia   . Hypertension   . Onychomycosis of toenail   . Renal disorder   . Tachycardia     Patient Active Problem List   Diagnosis Date Noted  . Essential hypertension, benign 10/30/2012  . HLD (hyperlipidemia) 08/08/2012  . Onychomycosis of toenail 07/04/2012  . 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/2012  . History of renal transplant 05/22/2012    Past Surgical History:  Procedure Laterality Date  . ANKLE SURGERY    . AV FISTULA PLACEMENT  1997   Removed after transplant in 2006  . KIDNEY SURGERY  2006        Home Medications    Prior to Admission medications   Medication Sig Start Date End Date Taking? Authorizing Provider  Chad Cordial ULTRA-THIN LANCETS MISC Check sugars three times a day 06/28/17    Palma Holter, MD  atorvastatin (LIPITOR) 40 MG tablet Take 1 tablet (40 mg total) by mouth daily. Patient not taking: Reported on 07/09/2017 07/13/16   Palma Holter, MD  Blood Glucose Monitoring Suppl (AGAMATRIX PRESTO PRO METER) DEVI Use to check sugars: AM, before meal. 2 hours after dinner. 06/28/17   Palma Holter, MD  glucose blood (AGAMATRIX PRESTO TEST) test strip Check sugars three times a day 06/28/17   Palma Holter, MD  ibuprofen (ADVIL,MOTRIN) 200 MG tablet Take 200 mg every 6 (six) hours as needed by mouth for mild pain.    [provider]  insulin aspart (NOVOLOG) 100 UNIT/ML injection Take 5 units of novolog with the biggest meal of the day. Patient taking differently: Inject 5 Units daily into the skin. AFTER THE LARGEST MEAL OF THE DAY 06/28/17   Palma Holter, MD  insulin glargine (LANTUS) 100 UNIT/ML injection Inject 0.4 mLs (40 Units total) into the skin at bedtime. 06/28/17   Palma Holter, MD  lidocaine (XYLOCAINE) 2 % solution Use as directed 15 mLs in the mouth or throat as needed for mouth pain. 05/09/18   Khatri, Hina, PA-C  metoprolol succinate (TOPROL-XL) 50 MG 24 hr tablet Take 50 mg by mouth daily. Take with or immediately following a meal.    [provider]  multivitamin (ONE-A-DAY MEN'S) TABS tablet Take 1 tablet daily by mouth.    [provider]  mycophenolate (CELLCEPT) 250 MG capsule Take  500 mg by mouth 2 (two) times daily.     [provider]  penicillin v potassium (VEETID) 500 MG tablet Take 1 tablet (500 mg total) by mouth 4 (four) times daily for 7 days. 05/09/18 05/16/18  Khatri, Hina, PA-C  Phenyleph-Promethazine-Cod 5-6.25-10 MG/5ML SYRP Take 5 mLs by mouth 2 (two) times daily as needed. Patient not taking: Reported on 07/09/2017 09/14/15   Janne Napoleon, NP  tacrolimus (PROGRAF) 1 MG capsule Take 1 capsule (1 mg total) by mouth 2 (two) times daily. 10/08/13   Glori Luis, MD    terbinafine (LAMISIL) 250 MG tablet Take 1 tablet (250 mg total) by mouth daily. 08/01/17   Felecia Shelling, DPM  Whey Protein POWD Take 1 Scoop See admin instructions by mouth. 1 scoop dissolved in 4 ounces of water once a day (DRINK)    [provider]    Family History Family History  Problem Relation Age of Onset  . Diabetes Brother   . Diabetes Maternal Grandmother   . Hypertension Maternal Grandmother     Social History Social History   Tobacco Use  . Smoking status: Never Smoker  . Smokeless tobacco: Never Used  Substance Use Topics  . Alcohol use: No  . Drug use: No     Allergies   Patient has no known allergies.   Review of Systems Review of Systems  All other systems reviewed and are negative.    Physical Exam Updated Vital Signs BP (!) 136/105 (BP Location: Right Arm)   Pulse 78   Temp 98 F (36.7 C) (Oral)   Resp 18   Ht 5\' 6"  (1.676 m)   Wt 77.1 kg   SpO2 97%   BMI 27.44 kg/m   Physical Exam  Constitutional: He appears well-developed and well-nourished.  HENT:  Head: Normocephalic and atraumatic.  Right Ear: External ear normal.  Left Ear: External ear normal.  Mouth/Throat:    The patient has normal phonation and is in control of secretions. No stridor.  Midline uvula without edema. Soft palate rises symmetrically.  No tonsillar erythema or exudates. No PTA. Tongue protrusion is normal. No trismus. Patient with missing tooth and pain as indicated in diagram. Patent with gingival abscess on adjacent gumline. No facial swelling. No creptius on neck palpation. Mucus membranes moist.   Eyes: Conjunctivae are normal. Right eye exhibits no discharge. Left eye exhibits no discharge. No scleral icterus.  Pulmonary/Chest: Effort normal. No respiratory distress.  Neurological: He is alert.  Skin: No pallor.  Psychiatric: He has a normal mood and affect.  Nursing note and vitals reviewed.    ED Treatments / Results  Labs (all labs  ordered are listed, but only abnormal results are displayed) Labs Reviewed - No data to display  EKG None  Radiology No results found.  Procedures .Marland KitchenIncision and Drainage Date/Time: 05/15/2018 11:50 AM Performed by: Jacinto Halim, PA-C Authorized by: Jacinto Halim, PA-C   Consent:    Consent obtained:  Verbal   Consent given by:  Patient   Risks discussed:  Bleeding, damage to other organs, infection, incomplete drainage and pain   Alternatives discussed:  No treatment Location:    Type:  Abscess   Size:  1.5cm   Location:  Head   Head/neck location: dental. Anesthesia (see MAR for exact dosages):    Anesthesia method:  Local infiltration   Local anesthetic:  Bupivacaine 0.5% WITH epi Procedure type:    Complexity:  Simple Procedure details:  Incision types:  Single straight   Scalpel blade:  11   Wound management:  Irrigated with saline   Drainage:  Purulent   Drainage amount:  Moderate   Wound treatment:  Wound left open   Packing materials:  None Post-procedure details:    Patient tolerance of procedure:  Tolerated well, no immediate complications   (including critical care time)  Medications Ordered in ED Medications  bupivacaine-epinephrine (MARCAINE W/ EPI) 0.5% -1:200000 injection 1.8 mL (1.8 mLs Infiltration Given by Other 05/15/18 1136)  oxyCODONE-acetaminophen (PERCOCET/ROXICET) 5-325 MG per tablet 1 tablet (1 tablet Oral Given 05/15/18 1135)     Initial Impression / Assessment and Plan / ED Course  I have reviewed the triage vital signs and the nursing notes.  Pertinent labs & imaging results that were available during my care of the patient were reviewed by me and considered in my medical decision making (see chart for details).     42 y.o. male with a dental abscess was amenable to incision and drainage.  No evidence of Ludwig's or spread of infection.  Patient did not require CT in the department.  His vital signs are stable.  Recommended  discontinuing penicillin and beginning clindamycin.  Will urge patient to follow-up with a dentist. Dental resources given.  Return precautions discussed.  Patient was given a short course of pain medication.  [place] was [place] the [place] controlled substance database without any discrepancies found.  Patient appears safe discharge.  Final Clinical Impressions(s) / ED Diagnoses   Final diagnoses:  Dental abscess    ED Discharge Orders         Ordered    HYDROcodone-acetaminophen (NORCO) 5-325 MG tablet  Every 6 hours PRN     05/15/18 1153    clindamycin (CLEOCIN) 150 MG capsule  3 times daily     05/15/18 1153           Princella PellegriniMaczis, Michael M, PA-C 05/16/18 1542    Azalia Bilisampos, Kevin, MD 05/16/18 1622

## 2018-09-23 DIAGNOSIS — Z940 Kidney transplant status: Secondary | ICD-10-CM | POA: Diagnosis not present

## 2018-09-23 DIAGNOSIS — E1129 Type 2 diabetes mellitus with other diabetic kidney complication: Secondary | ICD-10-CM | POA: Diagnosis not present

## 2018-09-23 DIAGNOSIS — I129 Hypertensive chronic kidney disease with stage 1 through stage 4 chronic kidney disease, or unspecified chronic kidney disease: Secondary | ICD-10-CM | POA: Diagnosis not present

## 2018-09-23 DIAGNOSIS — E785 Hyperlipidemia, unspecified: Secondary | ICD-10-CM | POA: Diagnosis not present

## 2018-10-22 ENCOUNTER — Telehealth: Payer: Self-pay | Admitting: *Deleted

## 2018-10-22 DIAGNOSIS — IMO0001 Reserved for inherently not codable concepts without codable children: Secondary | ICD-10-CM

## 2018-10-22 DIAGNOSIS — Z794 Long term (current) use of insulin: Principal | ICD-10-CM

## 2018-10-22 DIAGNOSIS — E1165 Type 2 diabetes mellitus with hyperglycemia: Principal | ICD-10-CM

## 2018-10-22 MED ORDER — INSULIN GLARGINE 100 UNIT/ML ~~LOC~~ SOLN: 40.0000 [IU] | Freq: Every day | SUBCUTANEOUS | 0 refills | Status: DC

## 2018-10-22 MED ORDER — AGAMATRIX PRESTO PRO METER DEVI: 0 refills | Status: DC

## 2018-10-22 MED ORDER — AGAMATRIX ULTRA-THIN LANCETS MISC: 0 refills | Status: DC

## 2018-10-22 MED ORDER — GLUCOSE BLOOD VI STRP: ORAL_STRIP | 12 refills | Status: DC

## 2018-10-22 NOTE — Telephone Encounter (Signed)
Pt arrives in clinic today asking for samples of lantus (was told to do this by MAP program).  He has not been seen here since 2018, but has an appt with Dr. Wonda Olds on 12/03/18.   Spoke with pt.  He was previously buying his lantus "off the street, but that person has now died" so he needs other options.  He has not been checking his sugars but states that he feels fine.  He has been taking 40 units of lantus @ bedtime and does not take the novolog.  Another barrier is that he has a $3000 deductible on meds with his insurance.  Spoke with Dr. Manson Passey, I will give him one pen that should last him 6 days.  Appt made with Primitivo Gauze to discuss DM and meet new PCP on Monday AM.  Pt is aware that he must keep this appt.  He is very grateful for our help.  I also made an appt with Dr. Raymondo Band he will check with work to see if he can have that day off.   Will forward to Dr. Manson Passey to send in new script for meter, strips and lancets but he will likely not be able to afford due to the deductible, but he will check into it.  If he is able to afford will check sugars once a day and bring them with him to appt.

## 2018-10-22 NOTE — Addendum Note (Signed)
Addended by: Manson Passey, Dannell Gortney on: 10/22/2018 01:35 PM   Modules accepted: Orders

## 2018-10-22 NOTE — Telephone Encounter (Signed)
Glucometer and supplies sent to [employer].  Patient has a history of renal transplant. Will need to work closely with Nephrology, Pharmacy and PCP Primitivo Gauze) to coordinate care.  Terisa Starr, MD  Family Medicine Teaching Service

## 2018-10-28 ENCOUNTER — Ambulatory Visit: Payer: BLUE CROSS/BLUE SHIELD | Admitting: Family Medicine

## 2018-10-28 VITALS — BP 126/80 | HR 82 | Temp 98.6°F | Wt 169.8 lb

## 2018-10-28 DIAGNOSIS — Z794 Long term (current) use of insulin: Secondary | ICD-10-CM | POA: Diagnosis not present

## 2018-10-28 DIAGNOSIS — IMO0001 Reserved for inherently not codable concepts without codable children: Secondary | ICD-10-CM

## 2018-10-28 DIAGNOSIS — E1165 Type 2 diabetes mellitus with hyperglycemia: Secondary | ICD-10-CM | POA: Diagnosis not present

## 2018-10-28 DIAGNOSIS — Z940 Kidney transplant status: Secondary | ICD-10-CM

## 2018-10-28 DIAGNOSIS — I10 Essential (primary) hypertension: Secondary | ICD-10-CM

## 2018-10-28 LAB — POCT GLYCOSYLATED HEMOGLOBIN (HGB A1C): HBA1C, POC (CONTROLLED DIABETIC RANGE): 10.5 % — AB (ref 0.0–7.0)

## 2018-10-28 MED ORDER — INSULIN GLARGINE 100 UNIT/ML ~~LOC~~ SOLN: 40.0000 [IU] | Freq: Every day | SUBCUTANEOUS | 0 refills | Status: DC

## 2018-10-28 NOTE — Patient Instructions (Signed)
It was great meeting you today!  I am sorry you had so much trouble affording her insulin.  These were to figure out what he can afford is for you to call your insurance company and asked them which long acting insulin they will cover.  Once you have this information please let me know and we can talk about what to do next.  Your A1c is essentially stable from last year, at 10.5.  I gave you a sample of Lantus to bridge you.  On the way out please schedule appointment with our pharmacy team.  We will be getting some blood work to help evaluate your kidney function relief.

## 2018-10-29 ENCOUNTER — Encounter: Payer: Self-pay | Admitting: Family Medicine

## 2018-10-29 DIAGNOSIS — Z794 Long term (current) use of insulin: Principal | ICD-10-CM

## 2018-10-29 DIAGNOSIS — IMO0001 Reserved for inherently not codable concepts without codable children: Secondary | ICD-10-CM | POA: Insufficient documentation

## 2018-10-29 DIAGNOSIS — E1165 Type 2 diabetes mellitus with hyperglycemia: Principal | ICD-10-CM

## 2018-10-29 LAB — BASIC METABOLIC PANEL
BUN/Creatinine Ratio: 13 (ref 9–20)
BUN: 22 mg/dL (ref 6–24)
CHLORIDE: 102 mmol/L (ref 96–106)
CO2: 23 mmol/L (ref 20–29)
Calcium: 9.7 mg/dL (ref 8.7–10.2)
Creatinine, Ser: 1.72 mg/dL — ABNORMAL HIGH (ref 0.76–1.27)
GFR calc Af Amer: 55 mL/min/{1.73_m2} — ABNORMAL LOW (ref >59)
GFR calc non Af Amer: 48 mL/min/{1.73_m2} — ABNORMAL LOW (ref >59)
Glucose: 293 mg/dL — ABNORMAL HIGH (ref 65–99)
Potassium: 4.5 mmol/L (ref 3.5–5.2)
Sodium: 137 mmol/L (ref 134–144)

## 2018-10-29 NOTE — Assessment & Plan Note (Signed)
BP 126/80.  Continue metoprolol succinate 50 mg daily.  ACE/arb would likely give best benefit but kidney function makes this tenuous.  We will follow-up BMP, possibly switch pending results.

## 2018-10-29 NOTE — Assessment & Plan Note (Addendum)
See nephrology every 3 months.  Per his report everything is looking good.  Fortunately do not have records from nephrology office.  Continue CellCept 500 mg twice daily, tacrolimus 1 mg twice daily.

## 2018-10-29 NOTE — Assessment & Plan Note (Addendum)
Will get BMP to evaluate kidney function.  This will influence diabetes treatment as would like to start metformin if kidneys tolerate.  If unable, will likely switch patient to [employer].  Unclear which short acting patient insurance will cover.  Would like to start NovoLog with meals if possible.  Gave 1 pen sample of Lantus to help bridge.  Will give long-term prescription once BMP results.

## 2018-10-29 NOTE — Progress Notes (Signed)
   HPI 43 year old African-American male who wishes to meet new PCP and for diabetes management.  A1c taken in clinic and was 10.5.  Patient has unfortunate history of post strep glomerulonephritis, requiring kidney transplant.  For the chronic steroid suppression he developed type 2 diabetes.  A1c taken clinic and was 10.5, stable from 10.3 back in 06/2017.  Patient has been out of Lantus, but received sample last week.  Notes that his Lantus is now unaffordable.  He called insurance company to see what would be covered it appears that basiglar and or insulin 70/30.  Patient has not had basic metabolic panel in our clinic since 2018.  He does see nephrology for 3 months states that his kidney function has been "doing pretty well".  He states that he is just been taking Lantus 40 daily.  He has not been taking any short acting.  Patient does have appointment with pharmacy clinic on 3/12.  States he may not build to make this as he recently has new work schedule.  Advised patient to reschedule on the way out the door for the appointment having works for him.  CC: Diabetes management   ROS:   Review of Systems See HPI for ROS.   CC, SH/smoking status, and VS noted  Objective: BP 126/80   Pulse 82   Temp 98.6 F (37 C) (Oral)   Wt 169 lb 12.8 oz (77 kg)   SpO2 98%   BMI 27.41 kg/m  Gen: 43 year old African-American male, no acute distress resting comfortably HEENT: NCAT, EOMI, PERRL CV: RRR, no murmur Resp: CTAB, no wheezes, non-labored Abd: SNTND, BS present, no guarding or organomegaly Neuro: Alert and oriented, Speech clear, No gross deficits   Assessment and plan:  Uncontrolled type 2 diabetes mellitus without complication, with long-term current use of insulin (HCC) Will get BMP to evaluate kidney function.  This will influence diabetes treatment as would like to start metformin if kidneys tolerate.  If unable, will likely switch patient to [employer].  Unclear which short  acting patient insurance will cover.  Would like to start NovoLog with meals if possible.  Gave 1 pen sample of Lantus to help bridge.  Will give long-term prescription once BMP results.  Essential hypertension, benign BP 126/80.  Continue metoprolol succinate 50 mg daily.  ACE/arb would likely give best benefit but kidney function makes this tenuous.  We will follow-up BMP, possibly switch pending results.  History of renal transplant See nephrology every 3 months.  Per his report everything is looking good.  Fortunately do not have records from nephrology office.  Continue CellCept 500 mg twice daily, tacrolimus 1 mg twice daily.   Orders Placed This Encounter  Procedures  . Basic Metabolic Panel  . HgB A1c    Meds ordered this encounter  Medications  . insulin glargine (LANTUS) 100 UNIT/ML injection    Sig: Inject 0.4 mLs (40 Units total) into the skin at bedtime.    Dispense:  3 mL    Refill:  0    Order Specific Question:   Lot Number?    Answer:   [id number]    Order Specific Question:   Expiration Date?    Answer:   10/25/2020    Order Specific Question:   NDC    Answer:   [id number] [[id number]]    Order Specific Question:   Quantity    Answer:   1     Myrene Buddy MD PGY-2 Family Medicine Resident  10/29/2018 10:20 AM

## 2018-10-30 ENCOUNTER — Telehealth: Payer: Self-pay | Admitting: *Deleted

## 2018-10-30 NOTE — Telephone Encounter (Signed)
Pt calls for 3 reasons:  1. He called his insurance as requested and he states that they cover Basaglar.  If he still cant afford this once called in he will need more samples.  2. Results of his lab work  3. Change appt with [facility] to give him enough time to request off at work (changed to 11/21/18)  Khaila Velarde, Maryjo Rochester, CMA

## 2018-11-01 MED ORDER — BASAGLAR KWIKPEN 100 UNIT/ML ~~LOC~~ SOPN: 40.0000 [IU] | PEN_INJECTOR | Freq: Every day | SUBCUTANEOUS | 0 refills | Status: DC

## 2018-11-01 NOTE — Telephone Encounter (Signed)
Family medicine telephone note  Called patient to discuss these below issues. Called [phone] number and was told by individual this was not his number. Attempted to call cell phone number [phone] but went straight to voicemail. Will continue to try to reach him throughout the day. If patient calls back, please relay that I will send in script for basaglar 40U daily to replace his lantus. His kidney function is good enough to start metformin as well, so would like to start that medication but would like to discuss with him first. Sent script for [facility]lar to [facility]. Will need to determine if he has supplies.  Myrene Buddy MD PGY-2 Family Medicine Resident

## 2018-11-04 ENCOUNTER — Encounter (HOSPITAL_COMMUNITY): Payer: Self-pay | Admitting: Emergency Medicine

## 2018-11-04 ENCOUNTER — Ambulatory Visit (HOSPITAL_COMMUNITY)
Admission: EM | Admit: 2018-11-04 | Discharge: 2018-11-04 | Disposition: A | Discharge status: Home / Self Care | Payer: BLUE CROSS/BLUE SHIELD | Attending: Internal Medicine | Admitting: Internal Medicine

## 2018-11-04 DIAGNOSIS — B349 Viral infection, unspecified: Secondary | ICD-10-CM | POA: Diagnosis not present

## 2018-11-04 MED ORDER — IPRATROPIUM BROMIDE 0.06 % NA SOLN: 2.0000 | Freq: Four times a day (QID) | NASAL | 0 refills | Status: DC

## 2018-11-04 MED ORDER — BENZONATATE 100 MG PO CAPS: 100.0000 mg | ORAL_CAPSULE | Freq: Three times a day (TID) | ORAL | 0 refills | Status: DC

## 2018-11-04 NOTE — Discharge Instructions (Signed)
Tessalon for cough. Start atrovent nasal spray for nasal congestion/drainage. You can use over the counter nasal saline rinse such as neti pot for nasal congestion. Keep hydrated, your urine should be clear to pale yellow in color. Tylenol/motrin for fever and pain. Monitor for any worsening of symptoms, chest pain, shortness of breath, wheezing, swelling of the throat, follow up for reevaluation.   For sore throat/cough try using a honey-based tea. Use 3 teaspoons of honey with juice squeezed from half lemon. Place shaved pieces of ginger into 1/2-1 cup of water and warm over stove top. Then mix the ingredients and repeat every 4 hours as needed.

## 2018-11-04 NOTE — ED Provider Notes (Signed)
[facility]    CSN: [id number] Arrival date & time: 11/04/18  1250     History   Chief Complaint Chief Complaint  Patient presents with  . URI    HPI Adam Davis is a 43 y.o. male.   43 year old male comes in for 3-day history of URI symptoms.  Has had rhinorrhea, nasal congestion, cough.  Has had subjective fever, T-max 99.5.  Denies chest pain, shortness of breath, wheezing.  Has been eating and drinking without difficulty.  Never smoker.  Positive sick contact.  Has been taking Tylenol for his symptoms.      Past Medical History:  Diagnosis Date  . Diabetes mellitus   . H/O acute post-streptococcal glomerulonephritis 1997  . History of renal transplant   . Hyperlipidemia   . Hypertension   . Onychomycosis of toenail   . Renal disorder   . Tachycardia     Patient Active Problem List   Diagnosis Date Noted  . Uncontrolled type 2 diabetes mellitus without complication, with long-term current use of insulin (HCC) 10/29/2018  . Essential hypertension, benign 10/30/2012  . HLD (hyperlipidemia) 08/08/2012  . Onychomycosis of toenail 07/04/2012  . 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/2012  . History of renal transplant 05/22/2012    Past Surgical History:  Procedure Laterality Date  . ANKLE SURGERY    . AV FISTULA PLACEMENT  1997   Removed after transplant in 2006  . KIDNEY SURGERY  2006       Home Medications    Prior to Admission medications   Medication Sig Start Date End Date Taking? Authorizing Provider  Chad Cordial ULTRA-THIN LANCETS MISC Check sugars three times a day 10/22/18   Westley Chandler, MD  atorvastatin (LIPITOR) 40 MG tablet Take 1 tablet (40 mg total) by mouth daily. Patient not taking: Reported on 07/09/2017 07/13/16   Palma Holter, MD  benzonatate (TESSALON) 100 MG capsule Take 1 capsule (100 mg total) by mouth every 8 (eight) hours. 11/04/18   Cathie Hoops, Finnigan Warriner V, PA-C  Blood Glucose Monitoring Suppl  (AGAMATRIX PRESTO PRO METER) DEVI Use to check sugars: AM, before meal. 2 hours after dinner. 10/22/18   Westley Chandler, MD  glucose blood (AGAMATRIX PRESTO TEST) test strip Check sugars three times a day 10/22/18   Westley Chandler, MD  HYDROcodone-acetaminophen (NORCO) 5-325 MG tablet Take 1 tablet by mouth every 6 (six) hours as needed. 05/15/18   Maczis, Elmer Sow, PA-C  ibuprofen (ADVIL,MOTRIN) 200 MG tablet Take 200 mg every 6 (six) hours as needed by mouth for mild pain.    [provider]  insulin aspart (NOVOLOG) 100 UNIT/ML injection Take 5 units of novolog with the biggest meal of the day. Patient taking differently: Inject 5 Units daily into the skin. AFTER THE LARGEST MEAL OF THE DAY 06/28/17   Palma Holter, MD  Insulin Glargine (BASAGLAR KWIKPEN) 100 UNIT/ML SOPN Inject 0.4 mLs (40 Units total) into the skin daily. 11/01/18   Myrene Buddy, MD  ipratropium (ATROVENT) 0.06 % nasal spray Place 2 sprays into both nostrils 4 (four) times daily. 11/04/18   Cathie Hoops, Lyla Jasek V, PA-C  lidocaine (XYLOCAINE) 2 % solution Use as directed 15 mLs in the mouth or throat as needed for mouth pain. 05/09/18   Khatri, Hina, PA-C  metoprolol succinate (TOPROL-XL) 50 MG 24 hr tablet Take 50 mg by mouth daily. Take with or immediately following a meal.    [provider]  multivitamin (ONE-A-DAY MEN'S)  TABS tablet Take 1 tablet daily by mouth.    [provider]  mycophenolate (CELLCEPT) 250 MG capsule Take 500 mg by mouth 2 (two) times daily.     [provider]  Phenyleph-Promethazine-Cod 5-6.25-10 MG/5ML SYRP Take 5 mLs by mouth 2 (two) times daily as needed. Patient not taking: Reported on 07/09/2017 09/14/15   Janne Napoleon, NP  tacrolimus (PROGRAF) 1 MG capsule Take 1 capsule (1 mg total) by mouth 2 (two) times daily. 10/08/13   Glori Luis, MD  terbinafine (LAMISIL) 250 MG tablet Take 1 tablet (250 mg total) by mouth daily. 08/01/17   Felecia Shelling, DPM  Whey Protein  POWD Take 1 Scoop See admin instructions by mouth. 1 scoop dissolved in 4 ounces of water once a day (DRINK)    [provider]    Family History Family History  Problem Relation Age of Onset  . Diabetes Brother   . Diabetes Maternal Grandmother   . Hypertension Maternal Grandmother     Social History Social History   Tobacco Use  . Smoking status: Never Smoker  . Smokeless tobacco: Never Used  Substance Use Topics  . Alcohol use: No  . Drug use: No     Allergies   Patient has no known allergies.   Review of Systems Review of Systems  Reason unable to perform ROS: See HPI as above.     Physical Exam Triage Vital Signs ED Triage Vitals  Enc Vitals Group     BP 11/04/18 1329 118/86     Pulse Rate 11/04/18 1329 97     Resp 11/04/18 1329 18     Temp 11/04/18 1329 97.8 F (36.6 C)     Temp Source 11/04/18 1329 Temporal     SpO2 11/04/18 1329 99 %     Weight --      Height --      Head Circumference --      Peak Flow --      Pain Score 11/04/18 1330 5     Pain Loc --      Pain Edu? --      Excl. in GC? --    No data found.  Updated Vital Signs BP 118/86 (BP Location: Left Arm)   Pulse 97   Temp 97.8 F (36.6 C) (Temporal)   Resp 18   SpO2 99%   Physical Exam Constitutional:      General: He is not in acute distress.    Appearance: He is well-developed. He is not ill-appearing, toxic-appearing or diaphoretic.  HENT:     Head: Normocephalic and atraumatic.     Right Ear: Tympanic membrane, ear canal and external ear normal. Tympanic membrane is not erythematous or bulging.     Left Ear: Tympanic membrane, ear canal and external ear normal. Tympanic membrane is not erythematous or bulging.     Nose: Nose normal.     Right Sinus: No maxillary sinus tenderness or frontal sinus tenderness.     Left Sinus: No maxillary sinus tenderness or frontal sinus tenderness.     Mouth/Throat:     Mouth: Mucous membranes are moist.     Pharynx: Oropharynx  is clear. Uvula midline.  Eyes:     Conjunctiva/sclera: Conjunctivae normal.     Pupils: Pupils are equal, round, and reactive to light.  Neck:     Musculoskeletal: Normal range of motion and neck supple.  Cardiovascular:     Rate and Rhythm: Normal rate and  regular rhythm.     Heart sounds: Normal heart sounds. No murmur. No friction rub. No gallop.   Pulmonary:     Effort: Pulmonary effort is normal. No accessory muscle usage, prolonged expiration, respiratory distress or retractions.     Breath sounds: Normal breath sounds. No stridor, decreased air movement or transmitted upper airway sounds. No decreased breath sounds, wheezing, rhonchi or rales.  Skin:    General: Skin is warm and dry.  Neurological:     Mental Status: He is alert and oriented to person, place, and time.      UC Treatments / Results  Labs (all labs ordered are listed, but only abnormal results are displayed) Labs Reviewed - No data to display  EKG None  Radiology No results found.  Procedures Procedures (including critical care time)  Medications Ordered in UC Medications - No data to display  Initial Impression / Assessment and Plan / UC Course  I have reviewed the triage vital signs and the nursing notes.  Pertinent labs & imaging results that were available during my care of the patient were reviewed by me and considered in my medical decision making (see chart for details).    Discussed with patient history and exam most consistent with viral URI. Symptomatic treatment as needed. Push fluids. Return precautions given.   Final Clinical Impressions(s) / UC Diagnoses   Final diagnoses:  Viral illness    ED Prescriptions    Medication Sig Dispense Auth. Provider   benzonatate (TESSALON) 100 MG capsule Take 1 capsule (100 mg total) by mouth every 8 (eight) hours. 21 capsule Josten Warmuth V, PA-C   ipratropium (ATROVENT) 0.06 % nasal spray Place 2 sprays into both nostrils 4 (four) times daily. 15  mL Threasa Alpha, [place] 11/04/18 1435

## 2018-11-04 NOTE — ED Triage Notes (Signed)
Pt here for URI sx x 3 days  

## 2018-11-07 ENCOUNTER — Ambulatory Visit: Payer: BLUE CROSS/BLUE SHIELD | Admitting: Pharmacist

## 2018-11-21 ENCOUNTER — Ambulatory Visit: Payer: BLUE CROSS/BLUE SHIELD | Admitting: Pharmacist

## 2018-12-03 ENCOUNTER — Encounter: Payer: BLUE CROSS/BLUE SHIELD | Admitting: Family Medicine

## 2018-12-12 ENCOUNTER — Telehealth: Payer: Self-pay | Admitting: Pharmacist

## 2018-12-12 NOTE — Telephone Encounter (Signed)
Contacted patient to follow up on medication access concerns.   Today, he notes that he was unable to afford Basaglar, as copay was ~$125. He has been picking up OTC Novolin 70/30, taking 15 units BID. He has not been able to afford a glucometer, so has not been checking BG.   Higher education careers adviser coupon card from [employer]. Contacted [facility], provided with the following information:  Johnney Killian [id number] - PCN [id number] - Alla German - ID [id number]  Pharmacy tech was getting a rejection, so she was going to call me back when she figures it out. Will also see if patient needs a new glucometer prescription sent in.

## 2018-12-13 MED ORDER — BAYER CONTOUR LINK MONITOR W/DEVICE KIT: PACK | 0 refills | Status: DC

## 2018-12-13 MED ORDER — GLUCOSE BLOOD VI STRP: ORAL_STRIP | 12 refills | Status: DC

## 2018-12-13 MED ORDER — MICROLET NEXT LANCING DEVICE MISC: 1.0000 | Freq: Three times a day (TID) | 0 refills | Status: DC

## 2018-12-13 MED ORDER — BAYER MICROLET LANCETS MISC: 12 refills | Status: DC

## 2018-12-13 NOTE — Telephone Encounter (Signed)
Contacted [facility]. They were able to run the Basaglar coupon card in combination with patient's insurance; copay will be $5.   The pharmacy does NOT have a glucometer prescription on file with the patient. Contacted [employer], but the provider line wouldn't help me because I didn't have the clinic NPI/TIN, and the patient line wouldn't help me because I'm not the patient.   Anecdotally, I have seen [employer] [place] patients have One Touch or Contour products covered.   Will route to [facility] RN for help sending glucometer prescription to [facility].   Contacted patient; left message letting him know the above. Will plan to call next week to f/u on blood sugars.   Catie Feliz Beam, PharmD, CPP PGY2 Ambulatory Care Pharmacy Resident, [facility] Phone: [phone]

## 2018-12-13 NOTE — Addendum Note (Signed)
Addended by: [name] D on: 12/13/2018 02:26 PM   Modules accepted: Orders

## 2018-12-19 ENCOUNTER — Telehealth: Payer: Self-pay | Admitting: Pharmacist

## 2018-12-19 NOTE — Telephone Encounter (Signed)
Contacted patient to follow up on obtaining Basaglar and glucometer. Left HIPAA compliant message for patient to return my call.

## 2018-12-26 NOTE — Telephone Encounter (Signed)
Contacted patient. He notes that he is "feeling so much better" on Basaglar instead of insulin 70/30. Reports that he was not able to pick up his glucometer due to not having money to do so, but he projects that he should be getting unemployment benefits later this week and will be able to pick up the testing supplies. Denies any symptoms of hypoglycemia, including dizziness, lightheadedness, shakiness.   Will call next week to re-evaluate.   Catie Feliz Beam, PharmD, CPP PGY2 Ambulatory Care Pharmacy Resident, [facility] Phone: [phone]

## 2019-01-02 ENCOUNTER — Telehealth: Payer: Self-pay | Admitting: Pharmacist

## 2019-01-02 NOTE — Telephone Encounter (Signed)
Contacted patient to follow up blood sugars, see if he was able to pick up a glucometer.   Left HIPAA compliant message for him to call me back.

## 2019-01-10 ENCOUNTER — Other Ambulatory Visit: Payer: Self-pay | Admitting: Family Medicine

## 2019-01-22 DIAGNOSIS — E785 Hyperlipidemia, unspecified: Secondary | ICD-10-CM | POA: Diagnosis not present

## 2019-01-22 DIAGNOSIS — I129 Hypertensive chronic kidney disease with stage 1 through stage 4 chronic kidney disease, or unspecified chronic kidney disease: Secondary | ICD-10-CM | POA: Diagnosis not present

## 2019-01-22 DIAGNOSIS — Z940 Kidney transplant status: Secondary | ICD-10-CM | POA: Diagnosis not present

## 2019-01-22 DIAGNOSIS — E1129 Type 2 diabetes mellitus with other diabetic kidney complication: Secondary | ICD-10-CM | POA: Diagnosis not present

## 2019-03-03 ENCOUNTER — Other Ambulatory Visit: Payer: Self-pay | Admitting: *Deleted

## 2019-03-03 ENCOUNTER — Other Ambulatory Visit: Payer: Self-pay | Admitting: Pharmacist

## 2019-03-03 MED ORDER — BASAGLAR KWIKPEN 100 UNIT/ML ~~LOC~~ SOPN: 40.0000 [IU] | PEN_INJECTOR | Freq: Every day | SUBCUTANEOUS | 3 refills | Status: DC

## 2019-03-03 NOTE — Telephone Encounter (Signed)
Received call from patient. In his message, he noted that he needs a refill on his [place], and that he requested this from the pharmacy. I do not see a refill request in Epic.   Will route to RN team to address, as well as pharmacy team in case further intensive diabetes support is needed.   Contacted patient, let him know to call [facility] directly with future needs.

## 2019-03-03 NOTE — Telephone Encounter (Signed)
Created refill request and sent to MD. Christen Bame, CMA

## 2019-03-03 NOTE — Addendum Note (Signed)
Addended by: Christen Bame D on: 03/03/2019 05:06 PM   Modules accepted: Orders

## 2019-03-13 ENCOUNTER — Encounter: Payer: Self-pay | Admitting: Family Medicine

## 2019-03-13 ENCOUNTER — Other Ambulatory Visit: Payer: Self-pay

## 2019-03-13 ENCOUNTER — Encounter: Payer: BLUE CROSS/BLUE SHIELD | Admitting: Family Medicine

## 2019-03-13 ENCOUNTER — Ambulatory Visit (INDEPENDENT_AMBULATORY_CARE_PROVIDER_SITE_OTHER): Payer: BC Managed Care – PPO | Admitting: Family Medicine

## 2019-03-13 VITALS — BP 118/80 | HR 86

## 2019-03-13 DIAGNOSIS — Z940 Kidney transplant status: Secondary | ICD-10-CM

## 2019-03-13 DIAGNOSIS — Z794 Long term (current) use of insulin: Secondary | ICD-10-CM

## 2019-03-13 DIAGNOSIS — IMO0001 Reserved for inherently not codable concepts without codable children: Secondary | ICD-10-CM

## 2019-03-13 DIAGNOSIS — E1165 Type 2 diabetes mellitus with hyperglycemia: Secondary | ICD-10-CM | POA: Diagnosis not present

## 2019-03-13 DIAGNOSIS — E139 Other specified diabetes mellitus without complications: Secondary | ICD-10-CM

## 2019-03-13 DIAGNOSIS — H109 Unspecified conjunctivitis: Secondary | ICD-10-CM

## 2019-03-13 LAB — POCT GLYCOSYLATED HEMOGLOBIN (HGB A1C): HbA1c, POC (controlled diabetic range): 11.6 % — AB (ref 0.0–7.0)

## 2019-03-13 MED ORDER — BASAGLAR KWIKPEN 100 UNIT/ML ~~LOC~~ SOPN: 50.0000 [IU] | PEN_INJECTOR | Freq: Every day | SUBCUTANEOUS | 0 refills | Status: DC

## 2019-03-13 MED ORDER — INSULIN ASPART 100 UNIT/ML ~~LOC~~ SOLN: SUBCUTANEOUS | 0 refills | Status: DC

## 2019-03-13 MED ORDER — POLYMYXIN B-TRIMETHOPRIM 10000-0.1 UNIT/ML-% OP SOLN: 2.0000 [drp] | Freq: Four times a day (QID) | OPHTHALMIC | 0 refills | Status: AC

## 2019-03-13 NOTE — Patient Instructions (Addendum)
It was great seeing you again today!  Unfortunately your hemoglobin A1c has gotten worse since our last visit.  Although it does sound like this has improved a little bit from your kidney doctor visit.  We will increase your short acting insulin to 15 units with the largest 2 meals of the day.  We will also increase your Basaglar dose to 50 units.  I would like to get some lab work to check your kidney function.  It is possible we may be helpful to add on an oral agent as well.  I think you have pinkeye.  I sent you in eyedrops for this to your pharmacy.

## 2019-03-14 LAB — COMPREHENSIVE METABOLIC PANEL
ALT: 28 IU/L (ref 0–44)
AST: 31 IU/L (ref 0–40)
Albumin/Globulin Ratio: 1.9 (ref 1.2–2.2)
Albumin: 4.3 g/dL (ref 4.0–5.0)
Alkaline Phosphatase: 73 IU/L (ref 39–117)
BUN/Creatinine Ratio: 15 (ref 9–20)
BUN: 21 mg/dL (ref 6–24)
Bilirubin Total: 0.3 mg/dL (ref 0.0–1.2)
CO2: 23 mmol/L (ref 20–29)
Calcium: 9.7 mg/dL (ref 8.7–10.2)
Chloride: 104 mmol/L (ref 96–106)
Creatinine, Ser: 1.43 mg/dL — ABNORMAL HIGH (ref 0.76–1.27)
GFR calc Af Amer: 68 mL/min/{1.73_m2} (ref >59)
GFR calc non Af Amer: 59 mL/min/{1.73_m2} — ABNORMAL LOW (ref >59)
Globulin, Total: 2.3 g/dL (ref 1.5–4.5)
Glucose: 143 mg/dL — ABNORMAL HIGH (ref 65–99)
Potassium: 3.8 mmol/L (ref 3.5–5.2)
Sodium: 140 mmol/L (ref 134–144)
Total Protein: 6.6 g/dL (ref 6.0–8.5)

## 2019-03-15 DIAGNOSIS — Z0000 Encounter for general adult medical examination without abnormal findings: Secondary | ICD-10-CM | POA: Insufficient documentation

## 2019-03-15 DIAGNOSIS — H109 Unspecified conjunctivitis: Secondary | ICD-10-CM | POA: Insufficient documentation

## 2019-03-15 NOTE — Assessment & Plan Note (Signed)
Please see under problem based charting, diabetes mellitus following renal transplant for new plan.

## 2019-03-15 NOTE — Assessment & Plan Note (Signed)
Continues to have uncontrolled type 2 diabetes following renal transplant.  Reports he is feeling much better while taking the Basaglar short acting, but A1c is up to 11.6 from 10.5.  He has been out of his aspart for about a month at this point.  Has not been checking his sugars.  Will increase Basaglar from 40 units to 50 units.  Will increase NovoLog from 5 units at largest meal to 15 units at 2 largest meals.  Discussed with pharmacy team who are in agreement with plan.  We will recheck kidney function.  If has really improved per patient says could potential be a candidate for oral therapy. -Increase Basaglar to 50 units daily -Increase aspart to 15 units with two largest meals of the day -Recheck A1c in 3 months -Recheck kidney function, could potentially start metformin

## 2019-03-15 NOTE — Assessment & Plan Note (Signed)
Sees nephrology every 3 months.  Per his report had a BMP which showed improved creatinine from last time.  Reports it was 1.4, not sure about his GFR.  Will redraw BMP.  On tacrolimus, and CellCept per nephro.

## 2019-03-15 NOTE — Progress Notes (Signed)
HPI 43 year old male who presents for annual physical.  Relevant past medical history patient.  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  A1c today increased to 11.6 from 10.5.  Patient was switched from insulin 70/30 to [place] and short acting back and March 2020.  Patient states that he has been eating well, with a lot of vegetables and lean protein.  He has been getting some exercise.  He has not been checking his sugars routinely.  Patient states that his kidney function was checked within the last month by a nephrologist and his creatinine was 1.4.  Was not sure about his GFR.  Of note patient has developed unilateral conjunctival injection on his left eye.  States this started approximately 3 days prior to clinic appointment.  Does not think he got any in his eye, no trauma to the eye that he knows about, does not wear contacts.  He has noticed a lot of discharge from his eye, especially in the morning, and even his eye crusted shut.   CC: Annual physical   ROS:   Review of Systems See HPI for ROS.   CC, SH/smoking status, and VS noted  Objective: BP 118/80   Pulse 86   SpO2 110%  Gen: 43 year old African-American male, no acute stress, resting comfortably, very pleasant HEENT: NCAT, EOMI, PERRL.  No bulging tympanic membrane, no erythematous ear canal bilaterally. CV: RRR, no murmur Resp: CTAB, no wheezes, non-labored Abd: SNTND, BS present, no guarding or organomegaly Neuro: Alert and oriented, Speech clear, No gross deficits   Assessment and plan:  History of renal transplant Sees nephrology every 3 months.  Per his report had a BMP which showed improved creatinine from last time.  Reports it was 1.4, not sure about his GFR.  Will redraw BMP.  On tacrolimus, and CellCept per nephro.  Diabetes mellitus following renal transplant Continues to have uncontrolled type 2 diabetes following  renal transplant.  Reports he is feeling much better while taking the Basaglar short acting, but A1c is up to 11.6 from 10.5.  He has been out of his aspart for about a month at this point.  Has not been checking his sugars.  Will increase Basaglar from 40 units to 50 units.  Will increase NovoLog from 5 units at largest meal to 15 units at 2 largest meals.  Discussed with pharmacy team who are in agreement with plan.  We will recheck kidney function.  If has really improved per patient says could potential be a candidate for oral therapy. -Increase Basaglar to 50 units daily -Increase aspart to 15 units with two largest meals of the day -Recheck A1c in 3 months -Recheck kidney function, could potentially start metformin  Uncontrolled type 2 diabetes mellitus without complication, with long-term current use of insulin ([place]) Please see under problem based charting, diabetes mellitus following renal transplant for new plan.  Annual physical exam Up-to-date on health screening.  No notable abnormality seen on physical exam.  Will get CMP to evaluate kidney function.  Bacterial conjunctivitis of left eye Severe conjunctival injection of the left eye.  No purulent discharge, but profound watery discharge.  Has not improved in 2 days.  Continues to be unilateral.  No other symptoms.  Will treat as bacterial conjunctivitis, given that patient is on immunosuppressive medications.  Polytrim, 2 drops in left eye, every 6 hours, for 7 days.   Orders Placed This Encounter  Procedures  .  Comprehensive metabolic panel    Order Specific Question:   Has the patient fasted?    Answer:   No  . HgB A1c    Meds ordered this encounter  Medications  . insulin aspart (NOVOLOG) 100 UNIT/ML injection    Sig: Take 15 units of novolog with your two biggest meals of the day    Dispense:  30 mL    Refill:  0    Dose change    Order Specific Question:   Lot Number?    Answer:   [id number]    Order Specific Question:    Expiration Date?    Answer:   09/27/2016    Order Specific Question:   Quantity    Answer:   1  . Insulin Glargine (BASAGLAR KWIKPEN) 100 UNIT/ML SOPN    Sig: Inject 0.5 mLs (50 Units total) into the skin daily.    Dispense:  45 mL    Refill:  0  . trimethoprim-polymyxin b (POLYTRIM) ophthalmic solution    Sig: Place 2 drops into the left eye every 6 (six) hours for 7 days.    Dispense:  10 mL    Refill:  0    Myrene BuddyJacob Nayel Purdy MD PGY-3 Family Medicine Resident  03/15/2019 10:52 PM

## 2019-03-15 NOTE — Assessment & Plan Note (Signed)
Severe conjunctival injection of the left eye.  No purulent discharge, but profound watery discharge.  Has not improved in 2 days.  Continues to be unilateral.  No other symptoms.  Will treat as bacterial conjunctivitis, given that patient is on immunosuppressive medications.  Polytrim, 2 drops in left eye, every 6 hours, for 7 days.

## 2019-03-15 NOTE — Assessment & Plan Note (Signed)
Up-to-date on health screening.  No notable abnormality seen on physical exam.  Will get CMP to evaluate kidney function.

## 2019-03-16 NOTE — Addendum Note (Signed)
Addended by: Owens Shark, Eliberto Sole on: 03/16/2019 01:25 PM   Modules accepted: Level of Service

## 2019-05-09 ENCOUNTER — Encounter (HOSPITAL_COMMUNITY): Payer: Self-pay | Admitting: Emergency Medicine

## 2019-05-09 ENCOUNTER — Ambulatory Visit (INDEPENDENT_AMBULATORY_CARE_PROVIDER_SITE_OTHER): Payer: Self-pay

## 2019-05-09 ENCOUNTER — Other Ambulatory Visit: Payer: Self-pay

## 2019-05-09 ENCOUNTER — Ambulatory Visit (HOSPITAL_COMMUNITY)
Admission: EM | Admit: 2019-05-09 | Discharge: 2019-05-09 | Disposition: A | Discharge status: Home / Self Care | Payer: Self-pay | Attending: Family Medicine | Admitting: Family Medicine

## 2019-05-09 DIAGNOSIS — E1122 Type 2 diabetes mellitus with diabetic chronic kidney disease: Secondary | ICD-10-CM

## 2019-05-09 DIAGNOSIS — Z760 Encounter for issue of repeat prescription: Secondary | ICD-10-CM

## 2019-05-09 DIAGNOSIS — M25531 Pain in right wrist: Secondary | ICD-10-CM

## 2019-05-09 DIAGNOSIS — Z940 Kidney transplant status: Secondary | ICD-10-CM

## 2019-05-09 DIAGNOSIS — I129 Hypertensive chronic kidney disease with stage 1 through stage 4 chronic kidney disease, or unspecified chronic kidney disease: Secondary | ICD-10-CM

## 2019-05-09 DIAGNOSIS — Z794 Long term (current) use of insulin: Secondary | ICD-10-CM

## 2019-05-09 LAB — GLUCOSE, CAPILLARY: Glucose-Capillary: 299 mg/dL — ABNORMAL HIGH (ref 70–99)

## 2019-05-09 MED ORDER — TRAMADOL HCL 50 MG PO TABS: 50.0000 mg | ORAL_TABLET | Freq: Four times a day (QID) | ORAL | 0 refills | Status: DC | PRN

## 2019-05-09 MED ORDER — BASAGLAR KWIKPEN 100 UNIT/ML ~~LOC~~ SOPN: 50.0000 [IU] | PEN_INJECTOR | Freq: Every day | SUBCUTANEOUS | 0 refills | Status: DC

## 2019-05-09 MED ORDER — INSULIN ASPART 100 UNIT/ML ~~LOC~~ SOLN: SUBCUTANEOUS | 0 refills | Status: DC

## 2019-05-09 NOTE — ED Provider Notes (Signed)
[place]    CSN: [id number] Arrival date & time: 05/09/19  1418      History   Chief Complaint Chief Complaint  Patient presents with  . Wrist Pain    HPI Adam Davis is a 43 y.o. male.   Patient is a 43 year old male with past medical history of diabetes, post streptococcal glomerular nephritis, history of renal transplant, hyperlipidemia, hypertension. He presents today with right wrist discomfort.  This is been present and worsening over the last week.  He lifts heavy boxes at work and does repetitive movements with his wrist and hands.  Reported started after a day of work. No injury to the wrist. No numbness, tingling or radiation of pain.   He is also requesting refill on his Insulin. He ran out yesterday. CBG 120 this am. Feel tired but otherwise okay.   ROS per HPI      Past Medical History:  Diagnosis Date  . Diabetes mellitus   . H/O acute post-streptococcal glomerulonephritis 1997  . History of renal transplant   . Hyperlipidemia   . Hypertension   . Onychomycosis of toenail   . Renal disorder   . Tachycardia     Patient Active Problem List   Diagnosis Date Noted  . Annual physical exam 03/15/2019  . Bacterial conjunctivitis of left eye 03/15/2019  . Uncontrolled type 2 diabetes mellitus without complication, with long-term current use of insulin ([place]) 10/29/2018  . Essential hypertension, benign 10/30/2012  . HLD (hyperlipidemia) 08/08/2012  . Onychomycosis of toenail 07/04/2012  . Diabetes mellitus following renal transplant ([place]) 05/22/2012  . History of renal transplant 05/22/2012    Past Surgical History:  Procedure Laterality Date  . ANKLE SURGERY    . AV FISTULA PLACEMENT  1997   Removed after transplant in 2006  . KIDNEY SURGERY  2006       Home Medications    Prior to Admission medications   Medication Sig Start Date End Date Taking? Authorizing Provider  amLODipine (NORVASC) 5 MG tablet Take 5 mg by mouth  daily.   Yes [provider]  Leandra Kern LANCETS MISC Check sugars three times a day 10/22/18   Martyn Malay, MD  Bayer Microlet Lancets lancets Use to check blood sugars 3 times a day. 12/13/18   Guadalupe Dawn, MD  Blood Glucose Monitoring Suppl (BAYER CONTOUR LINK MONITOR) w/Device KIT Use to check blood sugars 3 times a day. 12/13/18   Guadalupe Dawn, MD  glucose blood test strip Use to check blood sugars 3 times a day. 12/13/18   Guadalupe Dawn, MD  ibuprofen (ADVIL,MOTRIN) 200 MG tablet Take 200 mg every 6 (six) hours as needed by mouth for mild pain.    [provider]  insulin aspart (NOVOLOG) 100 UNIT/ML injection Take 15 units of novolog with your two biggest meals of the day 05/09/19   Loura Halt A, NP  Insulin Glargine (BASAGLAR KWIKPEN) 100 UNIT/ML SOPN Inject 0.5 mLs (50 Units total) into the skin daily. 05/09/19   Bast, Tressia Miners A, NP  ipratropium (ATROVENT) 0.06 % nasal spray Place 2 sprays into both nostrils 4 (four) times daily. 11/04/18   Ok Edwards, PA-C  Lancet Devices (MICROLET NEXT LANCING DEVICE) MISC 1 each by Does not apply route 3 (three) times daily. 12/13/18   Guadalupe Dawn, MD  multivitamin (ONE-A-DAY MEN'S) TABS tablet Take 1 tablet daily by mouth.    [provider]  mycophenolate (CELLCEPT) 250 MG capsule Take 500 mg  by mouth 2 (two) times daily.     [provider]  Phenyleph-Promethazine-Cod 5-6.25-10 MG/5ML SYRP Take 5 mLs by mouth 2 (two) times daily as needed. Patient not taking: Reported on 07/09/2017 09/14/15   Ashley Murrain, NP  tacrolimus (PROGRAF) 1 MG capsule Take 1 capsule (1 mg total) by mouth 2 (two) times daily. 10/08/13   Leone Haven, MD  terbinafine (LAMISIL) 250 MG tablet Take 1 tablet (250 mg total) by mouth daily. 08/01/17   Edrick Kins, DPM  traMADol (ULTRAM) 50 MG tablet Take 1 tablet (50 mg total) by mouth every 6 (six) hours as needed. 05/09/19   Orvan July, NP  Whey Protein POWD Take 1 Scoop  See admin instructions by mouth. 1 scoop dissolved in 4 ounces of water once a day (DRINK)    [provider]  atorvastatin (LIPITOR) 40 MG tablet Take 1 tablet (40 mg total) by mouth daily. Patient not taking: Reported on 07/09/2017 07/13/16 05/09/19  Smiley Houseman, MD  metoprolol succinate (TOPROL-XL) 50 MG 24 hr tablet Take 50 mg by mouth daily. Take with or immediately following a meal.  05/09/19  [provider]    Family History Family History  Problem Relation Age of Onset  . Diabetes Brother   . Diabetes Maternal Grandmother   . Hypertension Maternal Grandmother     Social History Social History   Tobacco Use  . Smoking status: Never Smoker  . Smokeless tobacco: Never Used  Substance Use Topics  . Alcohol use: No  . Drug use: No     Allergies   Patient has no known allergies.   Review of Systems Review of Systems   Physical Exam Triage Vital Signs ED Triage Vitals  Enc Vitals Group     BP 05/09/19 1522 (!) 138/97     Pulse Rate 05/09/19 1522 86     Resp 05/09/19 1522 18     Temp 05/09/19 1522 98.1 F (36.7 C)     Temp src --      SpO2 05/09/19 1522 95 %     Weight --      Height --      Head Circumference --      Peak Flow --      Pain Score 05/09/19 1524 6     Pain Loc --      Pain Edu? --      Excl. in [place]? --    No data found.  Updated Vital Signs BP (!) 138/97   Pulse 86   Temp 98.1 F (36.7 C)   Resp 18   SpO2 95%   Visual Acuity Right Eye Distance:   Left Eye Distance:   Bilateral Distance:    Right Eye Near:   Left Eye Near:    Bilateral Near:     Physical Exam Vitals signs and nursing note reviewed.  Constitutional:      Appearance: Normal appearance.  HENT:     Head: Normocephalic and atraumatic.     Nose: Nose normal.  Eyes:     Conjunctiva/sclera: Conjunctivae normal.  Neck:     Musculoskeletal: Normal range of motion.  Pulmonary:     Effort: Pulmonary effort is normal.  Musculoskeletal:      Right wrist: He exhibits decreased range of motion, tenderness and swelling. He exhibits no effusion, no crepitus, no deformity and no laceration.     Comments: Swelling and scarring from old dialysis graft.   Skin:  General: Skin is warm and dry.  Neurological:     Mental Status: He is alert.  Psychiatric:        Mood and Affect: Mood normal.      UC Treatments / Results  Labs (all labs ordered are listed, but only abnormal results are displayed) Labs Reviewed  GLUCOSE, CAPILLARY - Abnormal; Notable for the following components:      Result Value   Glucose-Capillary 299 (*)    All other components within normal limits  CBG MONITORING, ED    EKG   Radiology Dg Wrist Complete Right  Result Date: 05/09/2019 CLINICAL DATA:  Pain after repetitive motion EXAM: RIGHT WRIST - COMPLETE 3+ VIEW COMPARISON:  None. FINDINGS: Frontal, oblique, lateral, and ulnar deviation scaphoid images were obtained. No fracture or dislocation. No joint space narrowing or erosion. There is soft tissue calcification lateral and volar to the distal radius which appears tubular and may be vascular. IMPRESSION: Tubular appearing calcification lateral and volar to the distal right radius. This calcification may be of vascular etiology. If this area represents a partially calcified vessel, this vessel is dilated significantly. Note that there is associated soft tissue swelling in this area. It may be prudent to consider nonemergent duplex ultrasound of this area to further assess. No fracture or dislocation.  No appreciable arthropathy. Electronically Signed   By: Lowella Grip III M.D.   On: 05/09/2019 15:53    Procedures Procedures (including critical care time)  Medications Ordered in UC Medications - No data to display  Initial Impression / Assessment and Plan / UC Course  I have reviewed the triage vital signs and the nursing notes.  Pertinent labs & imaging results that were available during my  care of the patient were reviewed by me and considered in my medical decision making (see chart for details).     Pt is a 43 year old male here for right wrist pain and insulin refill.  Wrist x ray unremarkable besides calcified dialysis shunt  Radiologist recommended DVT study non emergent.  Pt made aware of this especially if the pain continues.  Most likely tendonitis  Pt unable to take NSAIDs due to kidney disease and prednisone due to diabetes and elevated blood sugars.  Blood sugar 299 today. He has not had insulin since Thursday evening.  Tried to call his doctor to get a free sample. He know longer has insurance and insulin is very expensive.  I refilled his long acting and short acting insulin and gave him tramadol for pain.  Ace wrap applied and light duty for work.  Recommend follow up with PCP next week for check up.  Final Clinical Impressions(s) / UC Diagnoses   Final diagnoses:  Right wrist pain     Discharge Instructions     Your wrist x ray did not show any acute problems.  The radiologist recommended an ultrasound if this pain continues.  You can either have your primary care or this or you can come back here for continued pain and we can go ahead and order at that time.  If your pain becomes severe or you start developing any loss of sensation or other concerning signs. I am giving you a few pain pills to help with the pain for the next couple days You need to rest, ice and elevate the wrist Ace wrap here in clinic Refilled your insulin Follow up with you      ED Prescriptions    Medication Sig Dispense Auth.  Provider   Insulin Glargine (BASAGLAR KWIKPEN) 100 UNIT/ML SOPN  (Status: Discontinued) Inject 0.5 mLs (50 Units total) into the skin daily. 45 mL Bast, Traci A, NP   traMADol (ULTRAM) 50 MG tablet  (Status: Discontinued) Take 1 tablet (50 mg total) by mouth every 6 (six) hours as needed. 12 tablet Bast, Traci A, NP   insulin aspart (NOVOLOG) 100 UNIT/ML  injection  (Status: Discontinued) Take 15 units of novolog with your two biggest meals of the day 30 mL Bast, Traci A, NP   Insulin Glargine (BASAGLAR KWIKPEN) 100 UNIT/ML SOPN  (Status: Discontinued) Inject 0.5 mLs (50 Units total) into the skin daily. 45 mL Bast, Traci A, NP   traMADol (ULTRAM) 50 MG tablet  (Status: Discontinued) Take 1 tablet (50 mg total) by mouth every 6 (six) hours as needed. 12 tablet Bast, Traci A, NP   insulin aspart (NOVOLOG) 100 UNIT/ML injection Take 15 units of novolog with your two biggest meals of the day 30 mL Bast, Traci A, NP   Insulin Glargine (BASAGLAR KWIKPEN) 100 UNIT/ML SOPN Inject 0.5 mLs (50 Units total) into the skin daily. 45 mL Bast, Traci A, NP   traMADol (ULTRAM) 50 MG tablet Take 1 tablet (50 mg total) by mouth every 6 (six) hours as needed. 12 tablet Loura Halt A, NP     Controlled Substance Prescriptions [place] Controlled Substance Registry consulted? yes   Orvan July, NP 05/10/19 [phone]

## 2019-05-09 NOTE — ED Triage Notes (Signed)
Triaged by provider  

## 2019-05-09 NOTE — Discharge Instructions (Addendum)
Your wrist x ray did not show any acute problems.  The radiologist recommended an ultrasound if this pain continues.  You can either have your primary care or this or you can come back here for continued pain and we can go ahead and order at that time.  If your pain becomes severe or you start developing any loss of sensation or other concerning signs. I am giving you a few pain pills to help with the pain for the next couple days You need to rest, ice and elevate the wrist Ace wrap here in clinic Refilled your insulin Follow up with you

## 2019-07-11 ENCOUNTER — Telehealth: Payer: Self-pay

## 2019-07-11 MED ORDER — INSULIN GLARGINE 100 UNIT/ML ~~LOC~~ SOLN: 50.0000 [IU] | SUBCUTANEOUS | 11 refills | Status: DC

## 2019-07-11 NOTE — Telephone Encounter (Signed)
Changed from basaglar 50U to lantus 50U. Sent script to [facility].  Guadalupe Dawn MD PGY-3 Family Medicine Resident

## 2019-07-11 NOTE — Telephone Encounter (Signed)
Pharmacist from [facility] on nurse line requesting change from [place] back to Lantus. Pharmacist stated patients insurance will only cover Lantus. Please advise.

## 2019-09-01 ENCOUNTER — Other Ambulatory Visit: Payer: Self-pay

## 2019-09-01 ENCOUNTER — Encounter (HOSPITAL_COMMUNITY): Payer: Self-pay

## 2019-09-01 ENCOUNTER — Ambulatory Visit (HOSPITAL_COMMUNITY)
Admission: EM | Admit: 2019-09-01 | Discharge: 2019-09-01 | Disposition: A | Discharge status: Home / Self Care | Payer: Medicaid Other | Attending: Family Medicine | Admitting: Family Medicine

## 2019-09-01 DIAGNOSIS — R1010 Upper abdominal pain, unspecified: Secondary | ICD-10-CM

## 2019-09-01 DIAGNOSIS — Z20822 Contact with and (suspected) exposure to covid-19: Secondary | ICD-10-CM | POA: Diagnosis present

## 2019-09-01 DIAGNOSIS — Z940 Kidney transplant status: Secondary | ICD-10-CM

## 2019-09-01 DIAGNOSIS — I10 Essential (primary) hypertension: Secondary | ICD-10-CM | POA: Diagnosis present

## 2019-09-01 LAB — POC SARS CORONAVIRUS 2 AG -  ED: SARS Coronavirus 2 Ag: NEGATIVE

## 2019-09-01 LAB — POC SARS CORONAVIRUS 2 AG: SARS Coronavirus 2 Ag: NEGATIVE

## 2019-09-01 MED ORDER — ESOMEPRAZOLE MAGNESIUM 20 MG PO CPDR: 20.0000 mg | DELAYED_RELEASE_CAPSULE | Freq: Every day | ORAL | 1 refills | Status: DC

## 2019-09-01 NOTE — ED Triage Notes (Signed)
Pt report he woke up this morning with abdominal pain and chills. Pt reports last weeks some coworkers tested positive for COVID.

## 2019-09-01 NOTE — Discharge Instructions (Signed)
You have been seen today for abdominal pain. Your evaluation was not suggestive of any emergent condition requiring medical intervention at this time. However, some abdominal problems make take more time to appear. Therefore, it is very important for you to pay attention to any new symptoms or worsening of your current condition.  Please return here or to the Emergency Department immediately should you begin to feel worse in any way or have any of the following symptoms: increasing or different abdominal pain, persistent vomiting, inability to drink fluids, fevers, or shaking chills.   If your Covid-19 test is positive, you will receive a phone call from [facility] regarding your results. Negative test results are not called. Both positive and negative results area always visible on MyChart. If you do not have a MyChart account, sign up instructions are in your discharge papers.

## 2019-09-01 NOTE — ED Provider Notes (Signed)
[place]   [id number] 09/01/19 Arrival Time: 0827  ASSESSMENT & PLAN:  1. Pain of upper abdomen   2. Essential hypertension   3. Contact with and (suspected) exposure to covid-19     Benign abdominal exam. No indications for urgent abdominal/pelvic imaging at this time. Discussed.  Meds ordered this encounter  Medications  . esomeprazole (NEXIUM 24HR) 20 MG capsule    Sig: Take 1 capsule (20 mg total) by mouth daily at 12 noon.    Dispense:  30 capsule    Refill:  1   Rapid COVID test negative. PCR testing sent.   Discharge Instructions     You have been seen today for abdominal pain. Your evaluation was not suggestive of any emergent condition requiring medical intervention at this time. However, some abdominal problems make take more time to appear. Therefore, it is very important for you to pay attention to any new symptoms or worsening of your current condition.  Please return here or to the Emergency Department immediately should you begin to feel worse in any way or have any of the following symptoms: increasing or different abdominal pain, persistent vomiting, inability to drink fluids, fevers, or shaking chills.   If your Covid-19 test is positive, you will receive a phone call from [facility] regarding your results. Negative test results are not called. Both positive and negative results area always visible on MyChart. If you do not have a MyChart account, sign up instructions are in your discharge papers.    Follow-up Information    [facility].   Specialty: Urgent Care Why: If symptoms worsen in any way. Contact information: [place]       Guadalupe Dawn, MD.   Specialty: Family Medicine Why: To recheck your blood pressure. Contact information: [address] [place] [address] [phone]           Adhere to simple, bland  diet. Adhere to low fat diet. Initiate empiric trial of acid suppression; see orders.   Reviewed expectations re: course of current medical issues. Questions answered. Outlined signs and symptoms indicating need for more acute intervention. Patient verbalized understanding. After Visit Summary given.   SUBJECTIVE: History from: patient. Adam Davis is a 44 y.o. male who presents with complaint of noting upper abdominal pain this morning. Reports eating a very heavy meal before bed last evening; questions relation. Ate breakfast and pain is easing off now. Pain described as a full feeling; without radiation; did not wake him last night. Reports normal flatus.Fever: absent. Aggravating factors: have not been identified. Alleviating factors: have not been identified. Associated symptoms: none. He denies arthralgias, constipation, diarrhea, dysuria, headache, myalgias, nausea and vomiting. Appetite: normal. PO intake: normal. Ambulatory without assistance. Urinary symptoms: none. Bowel movements: have not significantly changed; last bowel movement within the past 1-2 d and without blood. History of similar: no. OTC treatment: none; no new medications reported.  Does reports co-workers have tested positive for [place]. Questions relation to current symptoms. No respiratory or URI symptoms reported.  Increased blood pressure noted today. Reports that he is treated for HTN. He reports taking medications as instructed, no chest pain on exertion, no dyspnea on exertion, no swelling of ankles, no orthostatic dizziness or lightheadedness, no orthopnea or paroxysmal nocturnal dyspnea, no palpitations and no intermittent claudication symptoms.   Past Surgical History:  Procedure Laterality Date  . ANKLE SURGERY    .  AV FISTULA PLACEMENT  1997   Removed after transplant in 2006  . KIDNEY SURGERY  2006    ROS: As per HPI. All other systems negative.  OBJECTIVE:  Vitals:   09/01/19 0851  BP:  (!) 143/103  Pulse: (!) 101  Resp: 16  Temp: 98.5 F (36.9 C)  TempSrc: Oral  SpO2: 98%    General appearance: alert, oriented, no acute distress HEENT: [place]; AT; oropharynx moist Lungs: clear to auscultation bilaterally; unlabored respirations Heart: regular rate and rhythm (recheck HR 94) Abdomen: soft; without distention; firm area over LLQ but he reports this has been there since kidney transplant; mild  and poorly localized tenderness to palpation over RUQ and midline upper abdomen; normal bowel sounds; without masses or organomegaly; without guarding or rebound tenderness Back: without CVA tenderness; FROM at waist Extremities: without LE edema; symmetrical; without gross deformities Skin: warm and dry Neurologic: normal gait Psychological: alert and cooperative; normal mood and affect  Labs: Results for orders placed or performed during the hospital encounter of 09/01/19  POC SARS Coronavirus 2 Ag-ED - Nasal Swab (BD Veritor Kit)  Result Value Ref Range   SARS Coronavirus 2 Ag NEGATIVE NEGATIVE  POC SARS Coronavirus 2 Ag  Result Value Ref Range   SARS Coronavirus 2 Ag NEGATIVE NEGATIVE      No Known Allergies                                             Past Medical History:  Diagnosis Date  . Diabetes mellitus   . H/O acute post-streptococcal glomerulonephritis 1997  . History of renal transplant   . Hyperlipidemia   . Hypertension   . Onychomycosis of toenail   . Renal disorder   . Tachycardia    Social History   Socioeconomic History  . Marital status: Married    Spouse name: Not on file  . Number of children: Not on file  . Years of education: Not on file  . Highest education level: Not on file  Occupational History  . Not on file  Tobacco Use  . Smoking status: Never Smoker  . Smokeless tobacco: Never Used  Substance and Sexual Activity  . Alcohol use: No  . Drug use: No  . Sexual activity: Not on file  Other Topics Concern  . Not on file   Social History Narrative  . Not on file   Social Determinants of Health   Financial Resource Strain:   . Difficulty of Paying Living Expenses: Not on file  Food Insecurity:   . Worried About Charity fundraiser in the Last Year: Not on file  . Ran Out of Food in the Last Year: Not on file  Transportation Needs:   . Lack of Transportation (Medical): Not on file  . Lack of Transportation (Non-Medical): Not on file  Physical Activity:   . Days of Exercise per Week: Not on file  . Minutes of Exercise per Session: Not on file  Stress:   . Feeling of Stress : Not on file  Social Connections:   . Frequency of Communication with Friends and Family: Not on file  . Frequency of Social Gatherings with Friends and Family: Not on file  . Attends Religious Services: Not on file  . Active Member of Clubs or Organizations: Not on file  . Attends Archivist Meetings: Not  on file  . Marital Status: Not on file  Intimate Partner Violence:   . Fear of Current or Ex-Partner: Not on file  . Emotionally Abused: Not on file  . Physically Abused: Not on file  . Sexually Abused: Not on file   Family History  Problem Relation Age of Onset  . Diabetes Brother   . Diabetes Maternal Grandmother   . Hypertension Maternal Izetta Dakin, MD 09/01/19 [phone]

## 2019-09-02 ENCOUNTER — Telehealth: Payer: Self-pay | Admitting: General Practice

## 2019-09-02 NOTE — Telephone Encounter (Signed)
Negative COVID results given. Patient results "NOT Detected." Caller expressed understanding. ° °

## 2019-09-04 LAB — NOVEL CORONAVIRUS, NAA (HOSP ORDER, SEND-OUT TO REF LAB; TAT 18-24 HRS): SARS-CoV-2, NAA: NOT DETECTED

## 2019-09-11 ENCOUNTER — Ambulatory Visit (HOSPITAL_COMMUNITY)
Admission: EM | Admit: 2019-09-11 | Discharge: 2019-09-11 | Disposition: A | Discharge status: Home / Self Care | Payer: Medicaid Other | Attending: Family Medicine | Admitting: Family Medicine

## 2019-09-11 ENCOUNTER — Other Ambulatory Visit: Payer: Self-pay

## 2019-09-11 ENCOUNTER — Encounter (HOSPITAL_COMMUNITY): Payer: Self-pay

## 2019-09-11 DIAGNOSIS — Z20822 Contact with and (suspected) exposure to covid-19: Secondary | ICD-10-CM | POA: Diagnosis not present

## 2019-09-11 DIAGNOSIS — I10 Essential (primary) hypertension: Secondary | ICD-10-CM

## 2019-09-11 NOTE — ED Triage Notes (Signed)
Pt presents for covid testing after an exposure at work; pt is not having any symptoms.

## 2019-09-11 NOTE — Discharge Instructions (Signed)
Monitor MyChart for results °

## 2019-09-12 NOTE — ED Provider Notes (Signed)
[place]    CSN: [id number] Arrival date & time: 09/11/19  1543      History   Chief Complaint Chief Complaint  Patient presents with  . Covid Exposure    HPI Adam Davis is a 44 y.o. male history of DM type 2, HTN, s/p kidney transplant presenting today for COVID testing. He had exposure at work and is needing to be tested to return to work. He says that he possibly exposed throughout this past week from a co-worker who tested positive yesterday. He has not developed any symptoms. He denies URI symptoms, GI symptoms, fevers, chills, body aches, loss of appetite.   He expresses concern over his blood pressure today. States normally 341'D systolic. Is on amlodipine 10 daily, previously on metoprolol. Denies chest pain, SOB, headache, vision changes.   HPI  Past Medical History:  Diagnosis Date  . Diabetes mellitus   . H/O acute post-streptococcal glomerulonephritis 1997  . History of renal transplant   . Hyperlipidemia   . Hypertension   . Onychomycosis of toenail   . Renal disorder   . Tachycardia     Patient Active Problem List   Diagnosis Date Noted  . Annual physical exam 03/15/2019  . Bacterial conjunctivitis of left eye 03/15/2019  . Uncontrolled type 2 diabetes mellitus without complication, with long-term current use of insulin 10/29/2018  . Essential hypertension, benign 10/30/2012  . HLD (hyperlipidemia) 08/08/2012  . Onychomycosis of toenail 07/04/2012  . 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 History of renal transplant 05/22/2012    Past Surgical History:  Procedure Laterality Date  . ANKLE SURGERY    . AV FISTULA PLACEMENT  1997   Removed after transplant in 2006  . KIDNEY SURGERY  2006       Home Medications    Prior to Admission medications   Medication Sig Start Date End Date Taking? Authorizing Provider  Truddie Crumble ULTRA-THIN LANCETS MISC Check sugars three times a day 10/22/18   Martyn Malay, MD  amLODipine (NORVASC) 5 MG tablet Take 5 mg by mouth daily.    [provider]  Bayer Microlet Lancets lancets Use to check blood sugars 3 times a day. 12/13/18   Guadalupe Dawn, MD  Blood Glucose Monitoring Suppl (BAYER CONTOUR LINK MONITOR) w/Device KIT Use to check blood sugars 3 times a day. 12/13/18   Guadalupe Dawn, MD  esomeprazole (NEXIUM 24HR) 20 MG capsule Take 1 capsule (20 mg total) by mouth daily at 12 noon. 09/01/19   Vanessa Kick, MD  glucose blood test strip Use to check blood sugars 3 times a day. 12/13/18   Guadalupe Dawn, MD  ibuprofen (ADVIL,MOTRIN) 200 MG tablet Take 200 mg every 6 (six) hours as needed by mouth for mild pain.    [provider]  insulin aspart (NOVOLOG) 100 UNIT/ML injection Take 15 units of novolog with your two biggest meals of the day 05/09/19   Loura Halt A, NP  insulin glargine (LANTUS) 100 UNIT/ML injection Inject 0.5 mLs (50 Units total) into the skin every morning. 07/11/19   Guadalupe Dawn, MD  ipratropium (ATROVENT) 0.06 % nasal spray Place 2 sprays into both nostrils 4 (four) times daily. 11/04/18   Ok Edwards, PA-C  Lancet Devices (MICROLET NEXT LANCING DEVICE) MISC 1 each by Does not apply route 3 (three) times daily. 12/13/18   Guadalupe Dawn, MD  multivitamin (ONE-A-DAY MEN'S) TABS tablet Take 1 tablet daily by mouth.    [provider]  mycophenolate (CELLCEPT) 250 MG capsule Take 500 mg by mouth 2 (two) times daily.     [provider]  Phenyleph-Promethazine-Cod 5-6.25-10 MG/5ML SYRP Take 5 mLs by mouth 2 (two) times daily as needed. Patient not taking: Reported on 07/09/2017 09/14/15   Ashley Murrain, NP  tacrolimus (PROGRAF) 1 MG capsule Take 1 capsule (1 mg total) by mouth 2 (two) times daily. 10/08/13   Leone Haven, MD  terbinafine (LAMISIL) 250 MG tablet Take 1 tablet (250 mg total) by mouth daily. 08/01/17   Edrick Kins, DPM  traMADol (ULTRAM) 50 MG tablet Take 1 tablet (50 mg total) by mouth  every 6 (six) hours as needed. 05/09/19   Orvan July, NP  Whey Protein POWD Take 1 Scoop See admin instructions by mouth. 1 scoop dissolved in 4 ounces of water once a day (DRINK)    [provider]  atorvastatin (LIPITOR) 40 MG tablet Take 1 tablet (40 mg total) by mouth daily. Patient not taking: Reported on 07/09/2017 07/13/16 05/09/19  Smiley Houseman, MD  metoprolol succinate (TOPROL-XL) 50 MG 24 hr tablet Take 50 mg by mouth daily. Take with or immediately following a meal.  05/09/19  [provider]    Family History Family History  Problem Relation Age of Onset  . Diabetes Brother   . Diabetes Maternal Grandmother   . Hypertension Maternal Grandmother     Social History Social History   Tobacco Use  . Smoking status: Never Smoker  . Smokeless tobacco: Never Used  Substance Use Topics  . Alcohol use: No  . Drug use: No     Allergies   Patient has no known allergies.   Review of Systems Review of Systems  Constitutional: Negative for activity change, appetite change, chills, fatigue and fever.  HENT: Negative for congestion, ear pain, rhinorrhea, sinus pressure, sore throat and trouble swallowing.   Eyes: Negative for photophobia, pain, discharge, redness and visual disturbance.  Respiratory: Negative for cough, chest tightness and shortness of breath.   Cardiovascular: Negative for chest pain.  Gastrointestinal: Negative for abdominal pain, diarrhea, nausea and vomiting.  Genitourinary: Negative for decreased urine volume and hematuria.  Musculoskeletal: Negative for myalgias, neck pain and neck stiffness.  Skin: Negative for rash.  Neurological: Negative for dizziness, syncope, facial asymmetry, speech difficulty, weakness, light-headedness, numbness and headaches.     Physical Exam Triage Vital Signs ED Triage Vitals  Enc Vitals Group     BP 09/11/19 1729 (!) 141/89     Pulse Rate 09/11/19 1729 86     Resp 09/11/19 1729 18     Temp  09/11/19 1729 98.2 F (36.8 C)     Temp Source 09/11/19 1729 Oral     SpO2 09/11/19 1729 99 %     Weight --      Height --      Head Circumference --      Peak Flow --      Pain Score 09/11/19 1730 0     Pain Loc --      Pain Edu? --      Excl. in [place]? --    No data found.  Updated Vital Signs BP (!) 141/89 (BP Location: Left Arm)   Pulse 86   Temp 98.2 F (36.8 C) (Oral)   Resp 18   SpO2 99%   Visual Acuity Right Eye Distance:   Left Eye Distance:   Bilateral Distance:    Right Eye  Near:   Left Eye Near:    Bilateral Near:     Physical Exam Vitals and nursing note reviewed.  Constitutional:      Appearance: He is well-developed.     Comments: No acute distress  HENT:     Head: Normocephalic and atraumatic.     Nose: Nose normal.  Eyes:     Conjunctiva/sclera: Conjunctivae normal.  Cardiovascular:     Rate and Rhythm: Normal rate.  Pulmonary:     Effort: Pulmonary effort is normal. No respiratory distress.     Comments: Breathing comfortably at rest, CTABL, no wheezing, rales or other adventitious sounds auscultated Abdominal:     General: There is no distension.  Musculoskeletal:        General: Normal range of motion.     Cervical back: Neck supple.  Skin:    General: Skin is warm and dry.  Neurological:     Mental Status: He is alert and oriented to person, place, and time.      UC Treatments / Results  Labs (all labs ordered are listed, but only abnormal results are displayed) Labs Reviewed  NOVEL CORONAVIRUS, NAA (HOSP ORDER, SEND-OUT TO REF LAB; TAT 18-24 HRS)    EKG   Radiology No results found.  Procedures Procedures (including critical care time)  Medications Ordered in UC Medications - No data to display  Initial Impression / Assessment and Plan / UC Course  I have reviewed the triage vital signs and the nursing notes.  Pertinent labs & imaging results that were available during my care of the patient were reviewed by me and  considered in my medical decision making (see chart for details).  Clinical Course as of Sep 11 1099  Thu Sep 11, 2019  1759 Bp rechecked 138/92   [HW]    Clinical Course User Index [HW] Rodert Hinch C, PA-C    COVID PCR pending, currently asymptomatic. Discussed possible false-negatives depending on timing of exposure. Recommended re-testing if developing symptoms. Discussed quarantine recommendations.   BP up from normal baseline. Currently asymptomatic and not concerning for hypertensive urgency. Reassured patient, recommended to continue to monitor, follow up with PCP if continuing to elevate and continued monitoring of BP.   Discussed strict return precautions. Patient verbalized understanding and is agreeable with plan.  Final Clinical Impressions(s) / UC Diagnoses   Final diagnoses:  Exposure to COVID-19 virus  Encounter for laboratory testing for COVID-19 virus  Essential hypertension     Discharge Instructions     Monitor MyChart for results   ED Prescriptions    None     PDMP not reviewed this encounter.   Janith Lima, [place] 09/12/19 1107

## 2019-09-13 LAB — NOVEL CORONAVIRUS, NAA (HOSP ORDER, SEND-OUT TO REF LAB; TAT 18-24 HRS): SARS-CoV-2, NAA: NOT DETECTED

## 2019-09-16 ENCOUNTER — Telehealth: Payer: Self-pay | Admitting: *Deleted

## 2019-09-16 NOTE — Telephone Encounter (Signed)
Lantus solostar not covered by Medicaid.  Please see below of formulary.  Let "RN Team" know if you are changing to covered medications or would like to pursue a PA. [name], CMA   Covermymeds key: [id number]

## 2019-09-19 ENCOUNTER — Other Ambulatory Visit: Payer: Self-pay

## 2019-09-19 ENCOUNTER — Ambulatory Visit (INDEPENDENT_AMBULATORY_CARE_PROVIDER_SITE_OTHER): Payer: Medicaid Other | Admitting: Family Medicine

## 2019-09-19 ENCOUNTER — Telehealth: Payer: Self-pay

## 2019-09-19 VITALS — BP 117/80 | HR 87 | Wt 163.4 lb

## 2019-09-19 DIAGNOSIS — Z940 Kidney transplant status: Secondary | ICD-10-CM | POA: Diagnosis not present

## 2019-09-19 DIAGNOSIS — I10 Essential (primary) hypertension: Secondary | ICD-10-CM

## 2019-09-19 DIAGNOSIS — E139 Other specified diabetes mellitus without complications: Secondary | ICD-10-CM | POA: Diagnosis not present

## 2019-09-19 LAB — POCT GLYCOSYLATED HEMOGLOBIN (HGB A1C): HbA1c, POC (controlled diabetic range): 10.2 % — AB (ref 0.0–7.0)

## 2019-09-19 MED ORDER — METFORMIN HCL 500 MG PO TABS: 500.0000 mg | ORAL_TABLET | Freq: Two times a day (BID) | ORAL | 0 refills | Status: DC

## 2019-09-19 MED ORDER — ACCU-CHEK AVIVA PLUS W/DEVICE KIT: 1.0000 | PACK | Freq: Three times a day (TID) | 0 refills | Status: DC

## 2019-09-19 MED ORDER — BASAGLAR KWIKPEN 100 UNIT/ML ~~LOC~~ SOPN: 50.0000 [IU] | PEN_INJECTOR | SUBCUTANEOUS | 11 refills | Status: DC

## 2019-09-19 NOTE — Telephone Encounter (Signed)
Switched to basaglar at today's clinic visit  Myrene Buddy MD PGY-3 Family Medicine Resident

## 2019-09-19 NOTE — Telephone Encounter (Signed)
Patient stops by the office to ask for insulin samples. Per office note this morning, patient was switched to [employer] for insurance purposes. Per patient, Mariella Saa will still cost him ~300 dollars. Per PCP, ok to dispense a Lantus sample.  [id number] 05/27/2021

## 2019-09-19 NOTE — Patient Instructions (Signed)
Great to see you again today!  It looks like your new insurance will cover Basaglar instead of Lantus.  You will take the same dose of the Basaglar that he did Lantus.  I sent in a prescription for a glucose meter which medication hopefully cover.  Help that I think your kidney function is stable enough to start Metformin.  We will check your kidney function again today just to be sure nothing is changed since last visit.  Your A1c improved to 10.2 from 11.5.  Congratulations on this improvement is another take a lot of hard work.  Our goal is to get you to around 7 so there is still some work to do.

## 2019-09-20 LAB — BASIC METABOLIC PANEL
BUN/Creatinine Ratio: 14 (ref 9–20)
BUN: 20 mg/dL (ref 6–24)
CO2: 22 mmol/L (ref 20–29)
Calcium: 8 mg/dL — ABNORMAL LOW (ref 8.7–10.2)
Chloride: 102 mmol/L (ref 96–106)
Creatinine, Ser: 1.44 mg/dL — ABNORMAL HIGH (ref 0.76–1.27)
GFR calc Af Amer: 68 mL/min/{1.73_m2} (ref >59)
GFR calc non Af Amer: 59 mL/min/{1.73_m2} — ABNORMAL LOW (ref >59)
Glucose: 194 mg/dL — ABNORMAL HIGH (ref 65–99)
Potassium: 4 mmol/L (ref 3.5–5.2)
Sodium: 136 mmol/L (ref 134–144)

## 2019-09-23 ENCOUNTER — Encounter: Payer: Self-pay | Admitting: Family Medicine

## 2019-09-23 NOTE — Assessment & Plan Note (Addendum)
Well-controlled at this point.  BP 117/80.  Continue amlodipine 5 mg.  Nephrology also on board for this issue, could consider ACE/ARB at some point, but would defer starting this medication to them given the renal effects.

## 2019-09-23 NOTE — Progress Notes (Signed)
HPI 44 year old male who presents for diabetes follow-up.  He recently changed insurances and they would no longer cover his Lantus.  He states he has been out of his Lantus for about half a week.  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  He says that he has been eating well has been getting some exercise recently.  A1c performed today to clinic visit.  10.2 from 11.6.  States that he was seen at the nephrologist a couple of months ago and his kidney function looks stable and good.  He has been making good urine with no issues.  CC: Diabetes management   ROS:   Review of Systems See HPI for ROS.   CC, SH/smoking status, and VS noted  Objective: BP 117/80   Pulse 87   Wt 163 lb 6.4 oz (74.1 kg)   SpO2 98%   BMI 26.37 kg/m  Gen: 44 year old African-American male, no acute distress, resting comfortably CV: Regular rate and rhythm, no M/R/G Resp: Lungs clear to auscultation bilaterally Neuro: Alert and oriented, Speech clear, No gross deficits   Assessment and plan:  History of renal transplant Seems to be doing well from the standpoint.  Managed by nephrology kidney function stable from last check with creatinine 1.44, GFR above 60.  Continue nephrology follow-up.  Diabetes mellitus following renal transplant A1c improved to 10.2 from 11.6.  I offered congratulations to the patient on his improvement.  There is a big issue with getting the patient's insurance to cover the long-acting insulin.  Initially changed him to [facility] as I felt this was preferred, but was very expensive so gave patient Lantus sample.  I have asked patient to contact his insurance and see what they will cover.  I believe his kidney function is very stable at this point so we will add on Metformin.  Patient will likely be a good candidate for  GLP-1 or SGLT2 in the future, but will likely need insurance clarification on this as well. -Continue Lantus 50 units each morning, sample provided -We will need to figure out long-term coverage options for his long-acting insulin -Start Metformin 500 mg twice daily -Consider SGLT2 and GLP-1 down the road.  Essential hypertension, benign Well-controlled at this point.  BP 117/80.  Continue amlodipine 5 mg.  Nephrology also on board for this issue, could consider ACE/ARB at some point, but would defer starting this medication to them given the renal effects.   Orders Placed This Encounter  Procedures  . Basic Metabolic Panel  . HgB A1c    Meds ordered this encounter  Medications  . Insulin Glargine (BASAGLAR KWIKPEN) 100 UNIT/ML SOPN    Sig: Inject 0.5 mLs (50 Units total) into the skin every morning.    Dispense:  3 mL    Refill:  11  . Blood Glucose Monitoring Suppl (ACCU-CHEK AVIVA PLUS) w/Device KIT    Sig: 1 each by Does not apply route 4 (four) times daily -  before meals and at bedtime.    Dispense:  1 kit    Refill:  0  . metFORMIN (GLUCOPHAGE) 500 MG tablet    Sig: Take 1 tablet (500 mg total) by mouth 2 (two) times daily with a meal.    Dispense:  60 tablet    Refill:  0     Guadalupe Dawn MD  PGY-3 Family Medicine Resident  09/23/2019 8:15 PM

## 2019-09-23 NOTE — Assessment & Plan Note (Signed)
A1c improved to 10.2 from 11.6.  I offered congratulations to the patient on his improvement.  There is a big issue with getting the patient's insurance to cover the long-acting insulin.  Initially changed him to [facility] as I felt this was preferred, but was very expensive so gave patient Lantus sample.  I have asked patient to contact his insurance and see what they will cover.  I believe his kidney function is very stable at this point so we will add on Metformin.  Patient will likely be a good candidate for GLP-1 or SGLT2 in the future, but will likely need insurance clarification on this as well. -Continue Lantus 50 units each morning, sample provided -We will need to figure out long-term coverage options for his long-acting insulin -Start Metformin 500 mg twice daily -Consider SGLT2 and GLP-1 down the road.

## 2019-09-23 NOTE — Assessment & Plan Note (Signed)
Seems to be doing well from the standpoint.  Managed by nephrology kidney function stable from last check with creatinine 1.44, GFR above 60.  Continue nephrology follow-up.

## 2019-09-29 ENCOUNTER — Telehealth: Payer: Self-pay

## 2019-09-29 MED ORDER — TRESIBA FLEXTOUCH 200 UNIT/ML ~~LOC~~ SOPN: 50.0000 [IU] | PEN_INJECTOR | SUBCUTANEOUS | 0 refills | Status: DC

## 2019-09-29 NOTE — Telephone Encounter (Signed)
Yes, patient can have long-acting sample to last him until appointment on Monday. If I remember correctly we have tresiba in stock. He can continue tresiba at current dose (50U). I have placed an order for one pen. If we are out of tresiba please let me know and I will approve whichever long-acting we have.  Myrene Buddy MD PGY-3 Family Medicine Resident

## 2019-09-29 NOTE — Telephone Encounter (Signed)
Pt returned call to nurse line and states that he has enough lantus to get him through until tomorrow. Scheduled patient with Dr. Raymondo Band to discuss medication options. Appointment not until Monday 2/8.   Can we provide patient with samples until this appointment  To PCP and Dr. Weston Settle, RN

## 2019-09-29 NOTE — Telephone Encounter (Signed)
Pt left message on nurse line regarding receiving insulin samples. Returned patient's call, no answer, left VM for patient to call back.   Veronda Prude, RN

## 2019-09-30 NOTE — Telephone Encounter (Addendum)
Contacted patient and determined that he is in short supply of long-acting insulin (he has taken lantus/basaglar in the past).   He denies current use of short-acting insulin.   He shared that his insurance has changed.  He reported "amb-better" insurance through the exchange through [facility].  I explained that his insurance may necessitate a change of insulin type.  Until we can determine, I offered samples of Tresiba 100units/ml pens.   Medication Samples have been provided for patient pick-up (located in the [facility] refrigerator)  Drug name: Evaristo Bury (insulin degludec)       Strength: 100units/ml        Qty: 2 pens  LOT: [id number]  Exp.Date: 02/24/2021  Dosing instructions: decrease dosing from 50 units to 45 units once daily  The patient has been instructed regarding the correct time, dose, and frequency of taking this medication, including desired effects and most common side effects.   Madelon Lips 1:54 PM 09/30/2019  He plans to pick up insulin this afternoon or tomorrow.  He has scheduled a follow-up visit with me on 10/06/2019.

## 2019-10-03 ENCOUNTER — Telehealth: Payer: Self-pay | Admitting: *Deleted

## 2019-10-03 NOTE — Telephone Encounter (Signed)
Tried to contact pt to go over screening questions before visit on Monday and phone only rang and then said call could not be completed.  April Zimmerman Rumple, CMA

## 2019-10-06 ENCOUNTER — Encounter: Payer: Self-pay | Admitting: Pharmacist

## 2019-10-06 ENCOUNTER — Ambulatory Visit (INDEPENDENT_AMBULATORY_CARE_PROVIDER_SITE_OTHER): Payer: Medicaid Other | Admitting: Pharmacist

## 2019-10-06 ENCOUNTER — Other Ambulatory Visit: Payer: Self-pay

## 2019-10-06 DIAGNOSIS — E785 Hyperlipidemia, unspecified: Secondary | ICD-10-CM

## 2019-10-06 DIAGNOSIS — E1129 Type 2 diabetes mellitus with other diabetic kidney complication: Secondary | ICD-10-CM

## 2019-10-06 DIAGNOSIS — E139 Other specified diabetes mellitus without complications: Secondary | ICD-10-CM

## 2019-10-06 MED ORDER — INSULIN LISPRO (1 UNIT DIAL) 100 UNIT/ML (KWIKPEN): 15.0000 [IU] | PEN_INJECTOR | Freq: Two times a day (BID) | SUBCUTANEOUS | 0 refills | Status: DC

## 2019-10-06 MED ORDER — BASAGLAR KWIKPEN 100 UNIT/ML ~~LOC~~ SOPN: 50.0000 [IU] | PEN_INJECTOR | SUBCUTANEOUS | 11 refills | Status: DC

## 2019-10-06 MED ORDER — BASAGLAR KWIKPEN 100 UNIT/ML ~~LOC~~ SOPN: 50.0000 [IU] | PEN_INJECTOR | SUBCUTANEOUS | 0 refills | Status: DC

## 2019-10-06 MED ORDER — INSULIN LISPRO (1 UNIT DIAL) 100 UNIT/ML (KWIKPEN): 15.0000 [IU] | PEN_INJECTOR | Freq: Two times a day (BID) | SUBCUTANEOUS | 11 refills | Status: DC

## 2019-10-06 NOTE — Addendum Note (Signed)
Addended by: Kathrin Ruddy on: 10/06/2019 11:38 AM   Modules accepted: Orders

## 2019-10-06 NOTE — Progress Notes (Signed)
Reviewed: I agree with the documentation and management of Dr. Koval. 

## 2019-10-06 NOTE — Progress Notes (Signed)
S:     Chief Complaint  Patient presents with  . Medication Management    DM control    Patient arrives in good spirits and ambulating without assistance.  Presents for diabetes evaluation, education, and management Patient was referred and last seen by Primary Care Provider, Dr. Kris Mouton, on 09/19/2019.    Patient reports Diabetes was diagnosed in 2006 after renal transplant.   Family/Social History: no smoking history  Insurance coverage/medication affordability: [employer]  Patient reports moderate level of adherence with medications (has NOT taken bolus insulin previously prescribed).  Current diabetes medications include: metformin 500 mg BID, tresiba 45 units Current hypertension medications include: amlodipine 10 mg Current hyperlipidemia medications include: not taking atorvastatin due to muscle cramps   Patient reports about 1 hypoglycemic event per week that occurs at noon (denies eating breakfast routinely).   Patient reported dietary habits: Eats 2 meals/day. He eats a late lunch around 3-4 pm which is his largest meal of the day. Breakfast: minimal Lunch: largest meal  Dinner: works second shift as well.  Snacks: throughout the day.  O:  BP 132/88 Wt 160.4 lb  Physical Exam Vitals reviewed.  Constitutional:      Appearance: Normal appearance. He is normal weight.  Pulmonary:     Effort: Pulmonary effort is normal.  Neurological:     Mental Status: He is alert.  Psychiatric:        Mood and Affect: Mood normal.        Behavior: Behavior normal.        Thought Content: Thought content normal.        Judgment: Judgment normal.    Review of Systems  All other systems reviewed and are negative.   Lab Results  Component Value Date   HGBA1C 10.2 (A) 09/19/2019   Vitals:   10/06/19 0848  BP: 132/88  Pulse: 83  SpO2: 98%    Lipid Panel     Component Value Date/Time   CHOL 211 (H) 07/04/2012 0957   TRIG 277 (H) 07/04/2012 0957   HDL 33  (L) 07/04/2012 0957   CHOLHDL 6.4 07/04/2012 0957   VLDL 55 (H) 07/04/2012 0957   LDLCALC 123 (H) 07/04/2012 0957    Home fasting blood sugars: NOT testing     Clinical Atherosclerotic Cardiovascular Disease (ASCVD): No   A/P: Diabetes longstanding since renal transplant in 2006.  Currently prescribed basal and bolus regimen, however, he has only been taking basal due to insurance (high deductible) cost issues.  Patient is able to verbalize appropriate hypoglycemia management plan.  Control is suboptimal due to adherence and working two jobs with erratic food intake schedule and limited planned exercise. -Continued basal insulin basaglar (insulin glargine). (using [place] supply currently).  -Initiated a trial of  rapid insulin humalog  (insulin lispro) to 15 units prior to largest meal of the day (mid-afternoon).   -Extensively discussed pathophysiology of diabetes, recommended lifestyle interventions, dietary effects on blood sugar control -Counseled on s/sx of and management of hypoglycemia   ASCVD risk - primary prevention in patient with diabetes who is reports atorvastatin intolerance.  Last LDL is not controlled. ASCVD risk score needs new panel for assessment.   - non-adherent due to intolerance - has NOT tried another statin (added atorvastatin to intolerance list).   Hypertension longstanding and controlled on amlodipine 10mg  daily.  Blood pressure goal = < 130/80 mmHg. Patient medication adherence is reported as good.    Written patient instructions provided.  Total  time in face to face counseling 45 minutes.   Follow up Pharmacist by phone in 10-14 days.   Patient seen with Jannet Askew, PharmD Candidate and Charlett Nose, PharmD PGY-1 Resident.

## 2019-10-06 NOTE — Assessment & Plan Note (Signed)
Diabetes longstanding since renal transplant in 2006.  Currently prescribed basal and bolus regimen, however, he has only been taking basal due to insurance (high deductible) cost issues.  Patient is able to verbalize appropriate hypoglycemia management plan.  Control is suboptimal due to adherence and working two jobs with erratic food intake schedule and limited planned exercise. -Continued basal insulin basaglar (insulin glargine). (using [place] supply currently).  -Initiated a trial of  rapid insulin humalog  (insulin lispro) to 15 units prior to largest meal of the day (mid-afternoon).   -Extensively discussed pathophysiology of diabetes, recommended lifestyle interventions, dietary effects on blood sugar control -Counseled on s/sx of and management of hypoglycemia

## 2019-10-06 NOTE — Patient Instructions (Signed)
Great to see you today.   Please start Tresiba 45 units each morning.  Use until gone.  Then start Basaglar 45 units in the AM.   Start Humalog 15 units prior to your biggest meals (3-4 PM daily).    Plan phone call in 10-14 days (we will plan to call at 3:30).

## 2019-10-06 NOTE — Assessment & Plan Note (Signed)
ASCVD risk - primary prevention in patient with diabetes who is reports atorvastatin intolerance.  Last LDL is not controlled. ASCVD risk score needs new panel for assessment.   - non-adherent due to intolerance - has NOT tried another statin (added atorvastatin to intolerance list).

## 2019-10-07 ENCOUNTER — Telehealth: Payer: Self-pay | Admitting: *Deleted

## 2019-10-07 NOTE — Telephone Encounter (Signed)
Contacted patient to possible option for medication supply after discussion with [facility] pharmacist, Vanice Sarah.   Left message for patient to attempt call for support with his DM medication provided in clinic yesterday.   [employer] [phone] and they are able to help commercial patients cap their copay despite deductible. A prescription must be called in to the pharmacy of his choice--they do a copay card in this scenario.   I will await his completion of this call to determine status.  Plan to follow/up in 1 week.

## 2019-10-07 NOTE — Telephone Encounter (Signed)
Per pharmacy lantus solostar not covered by pt insurance. Please advise. Chace Bisch Bruna Potter, CMA

## 2019-10-15 ENCOUNTER — Telehealth: Payer: Self-pay | Admitting: Pharmacist

## 2019-10-15 NOTE — Telephone Encounter (Signed)
Contacted patient to follow-up evaluation for Diabetes control.   He states the his blood sugars have been doing well and he currently has adequate supply of medications.   He has NOT yet tried to call the [employer] to request help. BUT he is off tomorrow and plans to try then.   We agreed that I would plan to follow-up by phone next week and then determine next steps.  I remain hopeful that [employer] can help provided medication supply.

## 2019-10-21 ENCOUNTER — Other Ambulatory Visit: Payer: Self-pay

## 2019-10-21 ENCOUNTER — Encounter (HOSPITAL_COMMUNITY): Payer: Self-pay

## 2019-10-21 ENCOUNTER — Ambulatory Visit (HOSPITAL_COMMUNITY)
Admission: EM | Admit: 2019-10-21 | Discharge: 2019-10-21 | Disposition: A | Discharge status: Home / Self Care | Payer: Medicaid Other | Attending: Urgent Care | Admitting: Urgent Care

## 2019-10-21 DIAGNOSIS — M545 Low back pain, unspecified: Secondary | ICD-10-CM

## 2019-10-21 DIAGNOSIS — I10 Essential (primary) hypertension: Secondary | ICD-10-CM | POA: Diagnosis not present

## 2019-10-21 DIAGNOSIS — E119 Type 2 diabetes mellitus without complications: Secondary | ICD-10-CM | POA: Diagnosis not present

## 2019-10-21 DIAGNOSIS — R0981 Nasal congestion: Secondary | ICD-10-CM | POA: Diagnosis not present

## 2019-10-21 DIAGNOSIS — Z833 Family history of diabetes mellitus: Secondary | ICD-10-CM | POA: Diagnosis not present

## 2019-10-21 DIAGNOSIS — R490 Dysphonia: Secondary | ICD-10-CM | POA: Diagnosis present

## 2019-10-21 DIAGNOSIS — E785 Hyperlipidemia, unspecified: Secondary | ICD-10-CM | POA: Diagnosis not present

## 2019-10-21 DIAGNOSIS — Z794 Long term (current) use of insulin: Secondary | ICD-10-CM | POA: Insufficient documentation

## 2019-10-21 DIAGNOSIS — Z888 Allergy status to other drugs, medicaments and biological substances status: Secondary | ICD-10-CM | POA: Diagnosis not present

## 2019-10-21 DIAGNOSIS — Z20822 Contact with and (suspected) exposure to covid-19: Secondary | ICD-10-CM | POA: Diagnosis not present

## 2019-10-21 DIAGNOSIS — Z940 Kidney transplant status: Secondary | ICD-10-CM | POA: Insufficient documentation

## 2019-10-21 DIAGNOSIS — Z79899 Other long term (current) drug therapy: Secondary | ICD-10-CM | POA: Diagnosis not present

## 2019-10-21 MED ORDER — PSEUDOEPHEDRINE HCL 30 MG PO TABS: 30.0000 mg | ORAL_TABLET | Freq: Three times a day (TID) | ORAL | 0 refills | Status: DC | PRN

## 2019-10-21 MED ORDER — CETIRIZINE HCL 10 MG PO TABS: 10.0000 mg | ORAL_TABLET | Freq: Every day | ORAL | 0 refills | Status: DC

## 2019-10-21 NOTE — ED Triage Notes (Signed)
Pt presents with complaints of sore throat, voice hoarseness and back pain. Reports needing a covid test before returning to work. Denies any fever. Denies any recent close exposure to COVID

## 2019-10-21 NOTE — ED Provider Notes (Signed)
[place]   MRN: [id number] DOB: 05/19/1976  Subjective:   Adam Davis is a 44 y.o. male presenting for acute onset this morning of throat pain, hoarseness.  Symptoms has since cleared up.  Patient also had some mild back soreness but thinks that this was related to a lot of heavy lifting he did last night.  This is mild in nature.  His employer did emphasize need for getting checked out and making sure he does not have Covid so that other people are not exposed at work.  No current facility-administered medications for this encounter.  Current Outpatient Medications:  .  AGAMATRIX ULTRA-THIN LANCETS MISC, Check sugars three times a day, Disp: 100 each, Rfl: 0 .  amLODipine (NORVASC) 10 MG tablet, Take 10 mg by mouth daily., Disp: , Rfl:  .  Bayer Microlet Lancets lancets, Use to check blood sugars 3 times a day., Disp: 100 each, Rfl: 12 .  Blood Glucose Monitoring Suppl (ACCU-CHEK AVIVA PLUS) w/Device KIT, 1 each by Does not apply route 4 (four) times daily -  before meals and at bedtime., Disp: 1 kit, Rfl: 0 .  esomeprazole (NEXIUM 24HR) 20 MG capsule, Take 1 capsule (20 mg total) by mouth daily at 12 noon. (Patient not taking: Reported on 10/06/2019), Disp: 30 capsule, Rfl: 1 .  glucose blood test strip, Use to check blood sugars 3 times a day., Disp: 100 each, Rfl: 12 .  Insulin Degludec (TRESIBA FLEXTOUCH) 200 UNIT/ML SOPN, Inject 50 Units into the skin every morning., Disp: 1 pen, Rfl: 0 .  Insulin Glargine (BASAGLAR KWIKPEN) 100 UNIT/ML SOPN, Inject 0.5 mLs (50 Units total) into the skin every morning., Disp: 3 mL, Rfl: 0 .  insulin lispro (HUMALOG KWIKPEN) 100 UNIT/ML KwikPen, Inject 0.15 mLs (15 Units total) into the skin 2 (two) times daily before a meal., Disp: 2 pen, Rfl: 0 .  Lancet Devices (MICROLET NEXT LANCING DEVICE) MISC, 1 each by Does not apply route 3 (three) times daily., Disp: 1 each, Rfl: 0 .  metFORMIN (GLUCOPHAGE) 500 MG tablet, Take 1 tablet (500 mg  total) by mouth 2 (two) times daily with a meal., Disp: 60 tablet, Rfl: 0 .  multivitamin (ONE-A-DAY MEN'S) TABS tablet, Take 1 tablet daily by mouth., Disp: , Rfl:  .  mycophenolate (CELLCEPT) 250 MG capsule, Take 500 mg by mouth 2 (two) times daily. , Disp: , Rfl:  .  tacrolimus (PROGRAF) 1 MG capsule, Take 1 capsule (1 mg total) by mouth 2 (two) times daily., Disp: 60 capsule, Rfl: 0 .  Whey Protein POWD, Take 1 Scoop See admin instructions by mouth. 1 scoop dissolved in 4 ounces of water once a day (DRINK), Disp: , Rfl:    Allergies  Allergen Reactions  . Atorvastatin Other (See Comments)    Myalgia - resolved with discontinuation    Past Medical History:  Diagnosis Date  . Diabetes mellitus   . H/O acute post-streptococcal glomerulonephritis 1997  . History of renal transplant   . Hyperlipidemia   . Hypertension   . Onychomycosis of toenail   . Renal disorder   . Tachycardia      Past Surgical History:  Procedure Laterality Date  . ANKLE SURGERY    . AV FISTULA PLACEMENT  1997   Removed after transplant in 2006  . KIDNEY SURGERY  2006    Family History  Problem Relation Age of Onset  . Diabetes Brother   . Diabetes Maternal Grandmother   . Hypertension  Maternal Grandmother     Social History   Tobacco Use  . Smoking status: Never Smoker  . Smokeless tobacco: Never Used  Substance Use Topics  . Alcohol use: No  . Drug use: No    Review of Systems  Constitutional: Negative for fever and malaise/fatigue.  HENT: Negative for ear pain.   Eyes: Negative for discharge and redness.  Respiratory: Negative for cough, hemoptysis, shortness of breath and wheezing.   Cardiovascular: Negative for chest pain.  Gastrointestinal: Negative for abdominal pain, diarrhea, nausea and vomiting.  Genitourinary: Negative for dysuria, flank pain and hematuria.  Musculoskeletal: Positive for back pain. Negative for myalgias.  Skin: Negative for rash.  Neurological: Negative for  dizziness and weakness.  Psychiatric/Behavioral: Negative for depression and substance abuse.     Objective:   Vitals: BP (!) 143/99   Pulse 83   Temp 98.3 F (36.8 C)   Resp 19   SpO2 100%   Physical Exam Constitutional:      General: He is not in acute distress.    Appearance: Normal appearance. He is well-developed and normal weight. He is not ill-appearing, toxic-appearing or diaphoretic.  HENT:     Head: Normocephalic and atraumatic.     Right Ear: External ear normal.     Left Ear: External ear normal.     Nose: Nose normal.  Eyes:     General: No scleral icterus.       Right eye: No discharge.        Left eye: No discharge.     Extraocular Movements: Extraocular movements intact.  Cardiovascular:     Rate and Rhythm: Normal rate and regular rhythm.     Heart sounds: Normal heart sounds. No murmur. No friction rub. No gallop.   Pulmonary:     Effort: Pulmonary effort is normal. No respiratory distress.     Breath sounds: Normal breath sounds. No stridor. No wheezing, rhonchi or rales.  Musculoskeletal:     Cervical back: Normal range of motion.  Neurological:     Mental Status: He is alert and oriented to person, place, and time.  Psychiatric:        Mood and Affect: Mood normal.        Behavior: Behavior normal.        Thought Content: Thought content normal.        Judgment: Judgment normal.      Assessment and Plan :   1. Sinus congestion   2. Hoarseness   3. Acute low back pain without sciatica, unspecified back pain laterality     It is possible patient has COVID-19, testing pending.  Also possible that patient had some now resolved postnasal drainage related to weather changes, allergic rhinitis, start of a viral URI.  Suspect musculoskeletal type back pain.  Recommended conservative management for that.  Counseled patient on nature of COVID-19 including modes of transmission, diagnostic testing, management and supportive care.  Offered symptomatic  relief. COVID 19 testing is pending. Counseled patient on potential for adverse effects with medications prescribed/recommended today, ER and return-to-clinic precautions discussed, patient verbalized understanding.     Jaynee Eagles, [place] 10/21/19 1349

## 2019-10-21 NOTE — Discharge Instructions (Addendum)
For sore throat or cough try using a honey-based tea. Use 3 teaspoons of honey with juice squeezed from half lemon. Place shaved pieces of ginger into 1/2-1 cup of water and warm over stove top. Then mix the ingredients and repeat every 4 hours as needed. Please take Tylenol 500mg every 6 hours. Hydrate very well with at least 2 liters of water. Eat light meals such as soups to replenish electrolytes and soft fruits, veggies. Start an antihistamine like Zyrtec (cetirizine) at 10mg daily for postnasal drainage, sinus congestion.  You can take this together with pseudoephedrine (Sudafed) at a dose of 60 mg 3 times a day or twice daily as needed for the same kind of congestion.   

## 2019-10-23 ENCOUNTER — Telehealth: Payer: Self-pay | Admitting: Pharmacist

## 2019-10-23 DIAGNOSIS — E139 Other specified diabetes mellitus without complications: Secondary | ICD-10-CM

## 2019-10-23 LAB — NOVEL CORONAVIRUS, NAA (HOSP ORDER, SEND-OUT TO REF LAB; TAT 18-24 HRS): SARS-CoV-2, NAA: NOT DETECTED

## 2019-10-23 MED ORDER — INSULIN LISPRO (1 UNIT DIAL) 100 UNIT/ML (KWIKPEN): 15.0000 [IU] | PEN_INJECTOR | Freq: Three times a day (TID) | SUBCUTANEOUS | 99 refills | Status: DC

## 2019-10-23 MED ORDER — BASAGLAR KWIKPEN 100 UNIT/ML ~~LOC~~ SOPN: 45.0000 [IU] | PEN_INJECTOR | SUBCUTANEOUS | 99 refills | Status: DC

## 2019-10-23 NOTE — Telephone Encounter (Signed)
Noted and agree. 

## 2019-10-23 NOTE — Telephone Encounter (Signed)
Call from patient who shared that he has called "[employer]" and had good news.   He received a 30 day free supply AND also is getting a card for $35 per month (for his insulins).  He stated he could afford this cost.  He was happy about this solution.   Additionally, he reported blood sugars have been low multiple times.  He has been taking Basaglar (insulin glargine)  50 units daily and also Humalog 15 units prior to meals.   We agreed to reduce his Basaglar from 50 to 45 units daily and continue same dose meal time insulin as he reports majority of readings arestill > 200.   He believes the low readings were likely related to not eating enough.   He requested new prescriptions for insulin.  Agreed to send to his pharmacy for use with [employer] card.

## 2020-01-06 ENCOUNTER — Encounter (HOSPITAL_COMMUNITY): Payer: Self-pay | Admitting: Orthopedic Surgery

## 2020-01-06 ENCOUNTER — Ambulatory Visit (HOSPITAL_COMMUNITY)
Admission: EM | Admit: 2020-01-06 | Discharge: 2020-01-06 | Disposition: A | Discharge status: Home / Self Care | Payer: Medicaid Other | Attending: Family Medicine | Admitting: Family Medicine

## 2020-01-06 ENCOUNTER — Other Ambulatory Visit: Payer: Self-pay

## 2020-01-06 DIAGNOSIS — J029 Acute pharyngitis, unspecified: Secondary | ICD-10-CM | POA: Insufficient documentation

## 2020-01-06 DIAGNOSIS — Z20822 Contact with and (suspected) exposure to covid-19: Secondary | ICD-10-CM | POA: Insufficient documentation

## 2020-01-06 DIAGNOSIS — Z1152 Encounter for screening for COVID-19: Secondary | ICD-10-CM | POA: Insufficient documentation

## 2020-01-06 LAB — SARS CORONAVIRUS 2 (TAT 6-24 HRS): SARS Coronavirus 2: NEGATIVE

## 2020-01-06 LAB — POCT RAPID STREP A: Streptococcus, Group A Screen (Direct): NEGATIVE

## 2020-01-06 NOTE — ED Provider Notes (Signed)
[facility]    CSN: [id number] Arrival date & time: 01/06/20  1316      History   Chief Complaint Chief Complaint  Patient presents with  . Sore Throat    HPI Adam Davis is a 44 y.o. male.   Patient with history of diabetes reports for evaluation of sore throat.  Reports symptoms started 2 days ago.  Reports sore throat was more severe 2 days ago but is gradually improved to 5/10.  He has been doing salt water gargles.  And taking over-the-counter medications.  Reports last week he was around a Covid positive coworker.  He denies fevers, chills, headache, cough, congestion, nausea, vomiting, diarrhea.  Other than this coworker there have been no other sick contacts.     Past Medical History:  Diagnosis Date  . Diabetes mellitus   . H/O acute post-streptococcal glomerulonephritis 1997  . History of renal transplant   . Hyperlipidemia   . Hypertension   . Onychomycosis of toenail   . Renal disorder   . Tachycardia     Patient Active Problem List   Diagnosis Date Noted  . Annual physical exam 03/15/2019  . Bacterial conjunctivitis of left eye 03/15/2019  . Uncontrolled type 2 diabetes mellitus without complication, with long-term current use of insulin 10/29/2018  . Essential hypertension, benign 10/30/2012  . HLD (hyperlipidemia) 08/08/2012  . Onychomycosis of toenail 07/04/2012  . 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 History of renal transplant 05/22/2012    Past Surgical History:  Procedure Laterality Date  . ANKLE SURGERY    . AV FISTULA PLACEMENT  1997   Removed after transplant in 2006  . KIDNEY SURGERY  2006  . KIDNEY TRANSPLANT         Home Medications    Prior to Admission medications   Medication Sig Start Date End Date Taking? Authorizing Provider  amLODipine (NORVASC) 10 MG tablet Take 10 mg by mouth daily.   Yes [provider]  Bayer Microlet Lancets lancets Use to check blood sugars 3  times a day. 12/13/18  Yes Guadalupe Dawn, MD  Blood Glucose Monitoring Suppl (ACCU-CHEK AVIVA PLUS) w/Device KIT 1 each by Does not apply route 4 (four) times daily -  before meals and at bedtime. 09/19/19  Yes Guadalupe Dawn, MD  glucose blood test strip Use to check blood sugars 3 times a day. 12/13/18  Yes Guadalupe Dawn, MD  Insulin Glargine [facility] KWIKPEN) 100 UNIT/ML SOPN Inject 0.45 mLs (45 Units total) into the skin every morning. 10/23/19  Yes Hensel, Jamal Collin, MD  insulin lispro (HUMALOG KWIKPEN) 100 UNIT/ML KwikPen Inject 0.15 mLs (15 Units total) into the skin 3 (three) times daily before meals. 10/23/19  Yes Hensel, Jamal Collin, MD  Lancet Devices (MICROLET NEXT LANCING DEVICE) MISC 1 each by Does not apply route 3 (three) times daily. 12/13/18  Yes Guadalupe Dawn, MD  mycophenolate (CELLCEPT) 250 MG capsule Take 500 mg by mouth 2 (two) times daily.    Yes [provider]  tacrolimus (PROGRAF) 1 MG capsule Take 1 capsule (1 mg total) by mouth 2 (two) times daily. 10/08/13  Yes Leone Haven, MD  AGAMATRIX ULTRA-THIN LANCETS MISC Check sugars three times a day 10/22/18   Martyn Malay, MD  cetirizine (ZYRTEC ALLERGY) 10 MG tablet Take 1 tablet (10 mg total) by mouth daily. 10/21/19   Jaynee Eagles, PA-C  esomeprazole (NEXIUM 24HR) 20 MG capsule Take 1 capsule (20 mg total) by  mouth daily at 12 noon. Patient not taking: Reported on 10/06/2019 09/01/19   Vanessa Kick, MD  metFORMIN (GLUCOPHAGE) 500 MG tablet Take 1 tablet (500 mg total) by mouth 2 (two) times daily with a meal. 09/19/19   Guadalupe Dawn, MD  multivitamin (ONE-A-DAY MEN'S) TABS tablet Take 1 tablet daily by mouth.    [provider]  pseudoephedrine (SUDAFED) 30 MG tablet Take 1 tablet (30 mg total) by mouth every 8 (eight) hours as needed for congestion. 10/21/19   Jaynee Eagles, PA-C  Whey Protein POWD Take 1 Scoop See admin instructions by mouth. 1 scoop dissolved in 4 ounces of water once a day (DRINK)     [provider]  atorvastatin (LIPITOR) 40 MG tablet Take 1 tablet (40 mg total) by mouth daily. Patient not taking: Reported on 07/09/2017 07/13/16 05/09/19  Smiley Houseman, MD  metoprolol succinate (TOPROL-XL) 50 MG 24 hr tablet Take 50 mg by mouth daily. Take with or immediately following a meal.  05/09/19  [provider]    Family History Family History  Problem Relation Age of Onset  . Diabetes Brother   . Diabetes Maternal Grandmother   . Hypertension Maternal Grandmother     Social History Social History   Tobacco Use  . Smoking status: Never Smoker  . Smokeless tobacco: Never Used  Substance Use Topics  . Alcohol use: No  . Drug use: No     Allergies   Atorvastatin   Review of Systems Review of Systems  Per HPI Physical Exam Triage Vital Signs ED Triage Vitals  Enc Vitals Group     BP 01/06/20 1423 (!) 140/91     Pulse Rate 01/06/20 1423 85     Resp 01/06/20 1423 18     Temp 01/06/20 1423 98.6 F (37 C)     Temp Source 01/06/20 1423 Oral     SpO2 --      Weight --      Height --      Head Circumference --      Peak Flow --      Pain Score 01/06/20 1421 5     Pain Loc --      Pain Edu? --      Excl. in [place]? --    No data found.  Updated Vital Signs BP (!) 140/91 (BP Location: Right Arm)   Pulse 85   Temp 98.6 F (37 C) (Oral)   Resp 18   Visual Acuity Right Eye Distance:   Left Eye Distance:   Bilateral Distance:    Right Eye Near:   Left Eye Near:    Bilateral Near:     Physical Exam Vitals and nursing note reviewed.  Constitutional:      General: He is not in acute distress.    Appearance: He is well-developed. He is not ill-appearing.  HENT:     Head: Normocephalic and atraumatic.     Mouth/Throat:     Mouth: Mucous membranes are moist.     Pharynx: Uvula midline. Posterior oropharyngeal erythema present. No oropharyngeal exudate or uvula swelling.  Eyes:     Conjunctiva/sclera: Conjunctivae normal.   Cardiovascular:     Rate and Rhythm: Normal rate and regular rhythm.     Heart sounds: No murmur.  Pulmonary:     Effort: Pulmonary effort is normal. No respiratory distress.     Breath sounds: Normal breath sounds.  Abdominal:     Palpations: Abdomen is soft.  Tenderness: There is no abdominal tenderness.  Musculoskeletal:     Cervical back: Neck supple.  Skin:    General: Skin is warm and dry.  Neurological:     Mental Status: He is alert.      UC Treatments / Results  Labs (all labs ordered are listed, but only abnormal results are displayed) Labs Reviewed  SARS CORONAVIRUS 2 (TAT 6-24 HRS)  CULTURE, GROUP A STREP [facility])  POCT RAPID STREP A    EKG   Radiology No results found.  Procedures Procedures (including critical care time)  Medications Ordered in UC Medications - No data to display  Initial Impression / Assessment and Plan / UC Course  I have reviewed the triage vital signs and the nursing notes.  Pertinent labs & imaging results that were available during my care of the patient were reviewed by me and considered in my medical decision making (see chart for details).     #Viral pharyngitis #Exposure to Covid Patient is a 44 year old with history of diabetes presenting for sore throat.  Rapid strep negative.  Covid PCR was sent.  We will await the results of throat culture for consideration of any antibiotic therapies.  We will treat symptomatically for now.  Patient verbalized understanding the plan. Final Clinical Impressions(s) / UC Diagnoses   Final diagnoses:  Viral pharyngitis  Exposure to COVID-19 virus  Encounter for screening for COVID-19     Discharge Instructions     Strep was negative  Take 2 regular strength ibuprofen for pain. Continue at home treatments as you have  We sent a throat culture, if this shoes anything that requires change to treatment we will notify you  If your Covid-19 test is positive, you will receive a  phone call from [facility] regarding your results. Negative test results are not called. Both positive and negative results area always visible on MyChart. If you do not have a MyChart account, sign up instructions are in your discharge papers.   Persons who are directed to care for themselves at home may discontinue isolation under the following conditions:  . At least 10 days have passed since symptom onset and . At least 24 hours have passed without running a fever (this means without the use of fever-reducing medications) and . Other symptoms have improved.  Persons infected with COVID-19 who never develop symptoms may discontinue isolation and other precautions 10 days after the date of their first positive COVID-19 test.     ED Prescriptions    None     PDMP not reviewed this encounter.   Purnell Shoemaker, PA-C 01/07/20 0001

## 2020-01-06 NOTE — Discharge Instructions (Signed)
Strep was negative  Take 2 regular strength ibuprofen for pain. Continue at home treatments as you have  We sent a throat culture, if this shoes anything that requires change to treatment we will notify you  If your Covid-19 test is positive, you will receive a phone call from [facility] regarding your results. Negative test results are not called. Both positive and negative results area always visible on MyChart. If you do not have a MyChart account, sign up instructions are in your discharge papers.   Persons who are directed to care for themselves at home may discontinue isolation under the following conditions:   At least 10 days have passed since symptom onset and  At least 24 hours have passed without running a fever (this means without the use of fever-reducing medications) and  Other symptoms have improved.  Persons infected with COVID-19 who never develop symptoms may discontinue isolation and other precautions 10 days after the date of their first positive COVID-19 test.

## 2020-01-06 NOTE — ED Triage Notes (Signed)
Pts coworker tested positive for Covid, pt around coworker. Pt c/o sore throat that started on Saturday. No headache, fever or chills.  History of strep

## 2020-01-09 LAB — CULTURE, GROUP A STREP (THRC)

## 2020-04-29 ENCOUNTER — Encounter (HOSPITAL_COMMUNITY): Payer: Self-pay | Admitting: Emergency Medicine

## 2020-04-29 ENCOUNTER — Ambulatory Visit (HOSPITAL_COMMUNITY)
Admission: EM | Admit: 2020-04-29 | Discharge: 2020-04-29 | Disposition: A | Discharge status: Home / Self Care | Payer: Medicaid Other | Attending: Family Medicine | Admitting: Family Medicine

## 2020-04-29 ENCOUNTER — Other Ambulatory Visit: Payer: Self-pay

## 2020-04-29 DIAGNOSIS — R52 Pain, unspecified: Secondary | ICD-10-CM | POA: Diagnosis present

## 2020-04-29 DIAGNOSIS — R6883 Chills (without fever): Secondary | ICD-10-CM | POA: Insufficient documentation

## 2020-04-29 DIAGNOSIS — R030 Elevated blood-pressure reading, without diagnosis of hypertension: Secondary | ICD-10-CM | POA: Diagnosis present

## 2020-04-29 DIAGNOSIS — I10 Essential (primary) hypertension: Secondary | ICD-10-CM | POA: Diagnosis present

## 2020-04-29 DIAGNOSIS — E1165 Type 2 diabetes mellitus with hyperglycemia: Secondary | ICD-10-CM

## 2020-04-29 DIAGNOSIS — Z20822 Contact with and (suspected) exposure to covid-19: Secondary | ICD-10-CM | POA: Diagnosis present

## 2020-04-29 MED ORDER — ACETAMINOPHEN 325 MG PO TABS
ORAL_TABLET | ORAL | Status: AC
  Filled 2020-04-29: qty 2

## 2020-04-29 MED ORDER — BENZONATATE 100 MG PO CAPS
100.0000 mg | ORAL_CAPSULE | Freq: Three times a day (TID) | ORAL | 0 refills | Status: DC | PRN
Start: 2020-04-29 — End: 2021-02-09

## 2020-04-29 MED ORDER — PROMETHAZINE-DM 6.25-15 MG/5ML PO SYRP: 5.0000 mL | ORAL_SOLUTION | Freq: Every evening | ORAL | 0 refills | Status: DC | PRN

## 2020-04-29 MED ORDER — ACETAMINOPHEN 325 MG PO TABS
650.0000 mg | ORAL_TABLET | Freq: Once | ORAL | Status: AC
  Administered 2020-04-29: 650 mg via ORAL

## 2020-04-29 NOTE — ED Provider Notes (Signed)
[place]   MRN: [id number] DOB: Jun 10, 1976  Subjective:   Adam Davis is a 44 y.o. male presenting for 4-day history of acute onset moderate to severe body aches, chills.  Patient has felt general malaise and fatigue.  Had Covid exposure from 3 coworkers.  Has not been vaccinated.  Denies chest pain, shortness of breath, belly pain, hematuria.  He has uncontrolled type 2 diabetes and is on insulin.  No current facility-administered medications for this encounter.  Current Outpatient Medications:  .  AGAMATRIX ULTRA-THIN LANCETS MISC, Check sugars three times a day, Disp: 100 each, Rfl: 0 .  amLODipine (NORVASC) 10 MG tablet, Take 10 mg by mouth daily., Disp: , Rfl:  .  Bayer Microlet Lancets lancets, Use to check blood sugars 3 times a day., Disp: 100 each, Rfl: 12 .  Blood Glucose Monitoring Suppl (ACCU-CHEK AVIVA PLUS) w/Device KIT, 1 each by Does not apply route 4 (four) times daily -  before meals and at bedtime., Disp: 1 kit, Rfl: 0 .  cetirizine (ZYRTEC ALLERGY) 10 MG tablet, Take 1 tablet (10 mg total) by mouth daily., Disp: 30 tablet, Rfl: 0 .  esomeprazole (NEXIUM 24HR) 20 MG capsule, Take 1 capsule (20 mg total) by mouth daily at 12 noon. (Patient not taking: Reported on 10/06/2019), Disp: 30 capsule, Rfl: 1 .  glucose blood test strip, Use to check blood sugars 3 times a day., Disp: 100 each, Rfl: 12 .  Insulin Glargine (BASAGLAR KWIKPEN) 100 UNIT/ML SOPN, Inject 0.45 mLs (45 Units total) into the skin every morning., Disp: 15 mL, Rfl: prn .  insulin lispro (HUMALOG KWIKPEN) 100 UNIT/ML KwikPen, Inject 0.15 mLs (15 Units total) into the skin 3 (three) times daily before meals., Disp: 5 pen, Rfl: prn .  Lancet Devices (MICROLET NEXT LANCING DEVICE) MISC, 1 each by Does not apply route 3 (three) times daily., Disp: 1 each, Rfl: 0 .  metFORMIN (GLUCOPHAGE) 500 MG tablet, Take 1 tablet (500 mg total) by mouth 2 (two) times daily with a meal., Disp: 60 tablet, Rfl: 0 .   multivitamin (ONE-A-DAY MEN'S) TABS tablet, Take 1 tablet daily by mouth., Disp: , Rfl:  .  mycophenolate (CELLCEPT) 250 MG capsule, Take 500 mg by mouth 2 (two) times daily. , Disp: , Rfl:  .  pseudoephedrine (SUDAFED) 30 MG tablet, Take 1 tablet (30 mg total) by mouth every 8 (eight) hours as needed for congestion., Disp: 30 tablet, Rfl: 0 .  tacrolimus (PROGRAF) 1 MG capsule, Take 1 capsule (1 mg total) by mouth 2 (two) times daily., Disp: 60 capsule, Rfl: 0 .  Whey Protein POWD, Take 1 Scoop See admin instructions by mouth. 1 scoop dissolved in 4 ounces of water once a day (DRINK), Disp: , Rfl:    Allergies  Allergen Reactions  . Atorvastatin Other (See Comments)    Myalgia - resolved with discontinuation    Past Medical History:  Diagnosis Date  . Diabetes mellitus   . H/O acute post-streptococcal glomerulonephritis 1997  . History of renal transplant   . Hyperlipidemia   . Hypertension   . Onychomycosis of toenail   . Renal disorder   . Tachycardia      Past Surgical History:  Procedure Laterality Date  . ANKLE SURGERY    . AV FISTULA PLACEMENT  1997   Removed after transplant in 2006  . KIDNEY SURGERY  2006  . KIDNEY TRANSPLANT      Family History  Problem Relation Age of Onset  .  Diabetes Brother   . Diabetes Maternal Grandmother   . Hypertension Maternal Grandmother     Social History   Tobacco Use  . Smoking status: Never Smoker  . Smokeless tobacco: Never Used  Substance Use Topics  . Alcohol use: No  . Drug use: No    ROS   Objective:   Vitals: BP (!) 161/107   Pulse (!) 102   Temp 100 F (37.8 C) (Oral)   Resp 16   SpO2 97%   Physical Exam Constitutional:      General: He is not in acute distress.    Appearance: Normal appearance. He is well-developed. He is ill-appearing. He is not toxic-appearing or diaphoretic.  HENT:     Head: Normocephalic and atraumatic.     Right Ear: External ear normal.     Left Ear: External ear normal.      Nose: Nose normal.     Mouth/Throat:     Mouth: Mucous membranes are moist.     Pharynx: Oropharynx is clear.  Eyes:     General: No scleral icterus.       Right eye: No discharge.        Left eye: No discharge.     Extraocular Movements: Extraocular movements intact.     Conjunctiva/sclera: Conjunctivae normal.     Pupils: Pupils are equal, round, and reactive to light.  Cardiovascular:     Rate and Rhythm: Normal rate and regular rhythm.     Heart sounds: Normal heart sounds. No murmur heard.  No friction rub. No gallop.   Pulmonary:     Effort: Pulmonary effort is normal. No respiratory distress.     Breath sounds: Normal breath sounds. No stridor. No wheezing, rhonchi or rales.  Neurological:     Mental Status: He is alert and oriented to person, place, and time.  Psychiatric:        Mood and Affect: Mood normal.        Behavior: Behavior normal.        Thought Content: Thought content normal.        Judgment: Judgment normal.      Assessment and Plan :   PDMP not reviewed this encounter.  1. Body aches   2. Chills   3. Close exposure to COVID-19 virus   4. Uncontrolled type 2 diabetes mellitus with hyperglycemia ([place])   5. Essential hypertension   6. Elevated blood pressure reading     High suspicion for COVID-19 given symptoms had, exposure.  Recommended supportive care.  Patient is very high risk and would benefit from infusion treatment in light of having uncontrolled diabetes and is a renal transplant patient.  Labs pending. Counseled patient on potential for adverse effects with medications prescribed/recommended today, ER and return-to-clinic precautions discussed, patient verbalized understanding.    Jaynee Eagles, [place] 04/29/20 1643

## 2020-04-29 NOTE — ED Triage Notes (Signed)
PT reports 3 coworkers have COVID. He has had bodyaches and chills since Monday.

## 2020-04-29 NOTE — Discharge Instructions (Addendum)

## 2020-04-30 LAB — SARS CORONAVIRUS 2 (TAT 6-24 HRS): SARS Coronavirus 2: POSITIVE — AB

## 2020-05-01 ENCOUNTER — Telehealth: Payer: Self-pay | Admitting: Family

## 2020-05-01 ENCOUNTER — Other Ambulatory Visit: Payer: Self-pay | Admitting: Family

## 2020-05-01 DIAGNOSIS — I10 Essential (primary) hypertension: Secondary | ICD-10-CM

## 2020-05-01 DIAGNOSIS — E139 Other specified diabetes mellitus without complications: Secondary | ICD-10-CM

## 2020-05-01 DIAGNOSIS — U071 COVID-19: Secondary | ICD-10-CM

## 2020-05-01 NOTE — Progress Notes (Signed)
I connected by phone with Adam Davis on 05/01/2020 at 2:54 PM to discuss the potential use of a new treatment for mild to moderate COVID-19 viral infection in non-hospitalized patients.  This patient is a 44 y.o. male that meets the FDA criteria for Emergency Use Authorization of COVID monoclonal antibody casirivimab/imdevimab.  Has a (+) direct SARS-CoV-2 viral test result  Has mild or moderate COVID-19   Is NOT hospitalized due to COVID-19  Is within 10 days of symptom onset  Has at least one of the high risk factor(s) for progression to severe COVID-19 and/or hospitalization as defined in EUA.  Specific high risk criteria : Diabetes, Immunosuppressive Disease or Treatment and Cardiovascular disease or hypertension   I have spoken and communicated the following to the patient or parent/caregiver regarding COVID monoclonal antibody treatment:  1. FDA has authorized the emergency use for the treatment of mild to moderate COVID-19 in adults and pediatric patients with positive results of direct SARS-CoV-2 viral testing who are 53 years of age and older weighing at least 40 kg, and who are at high risk for progressing to severe COVID-19 and/or hospitalization.  2. The significant known and potential risks and benefits of COVID monoclonal antibody, and the extent to which such potential risks and benefits are unknown.  3. Information on available alternative treatments and the risks and benefits of those alternatives, including clinical trials.  4. Patients treated with COVID monoclonal antibody should continue to self-isolate and use infection control measures (e.g., wear mask, isolate, social distance, avoid sharing personal items, clean and disinfect high touch surfaces, and frequent handwashing) according to CDC guidelines.   5. The patient or parent/caregiver has the option to accept or refuse COVID monoclonal antibody treatment.  After reviewing this information with the  patient, The patient agreed to proceed with receiving casirivimab\imdevimab infusion and will be provided a copy of the Fact sheet prior to receiving the infusion.   Jeanine Luz, NP 05/01/2020 2:54 PM

## 2020-05-01 NOTE — Telephone Encounter (Signed)
Called to Discuss with patient about Covid symptoms and the use of the monoclonal antibody infusion for those with mild to moderate Covid symptoms and at a high risk of hospitalization.     Pt appears to qualify for this infusion due to co-morbid conditions and/or a member of an at-risk group in accordance with the FDA Emergency Use Authorization.   Mr. Adam Davis was seen in [facility] on 9/2 with body aches, chills, fatigue and general malaise. Onset of symptoms was 8/29. Risk factors include diabetes, renal transplant and hypertension. Discussed risks and benefits of treatment with Regeneron and Mr. Adam Davis wishes to proceed with therapy.  Hello Adam Davis,   You have been scheduled to receive Regeneron (the monoclonal antibody we discussed) on : 05/02/20 at 2:30pm  If you have been tested outside of a [facility] - you MUST bring a copy of your positive test with you the morning of your appointment. You may take a photo of this and upload to your MyChart portal or have the testing facility fax the result to [phone]    The address for the infusion clinic site is:  --GPS address is [address] - the parking is located near [employer] building where you will see  COVID19 Infusion feather banner marking the entrance to parking.   (see photos below)            --Enter into the 2nd entrance where the "wave, flag banner" is at the road. Turn into this 2nd entrance and immediately turn left to park in 1 of the 5 parking spots.   --Please stay in your car and call the desk for assistance inside [phone].   --Average time in department is roughly 2 hours for Regeneron treatment - this includes preparation of the medication, IV start and the required 1 hour monitoring after the infusion.    Should you develop worsening shortness of breath, chest pain or severe breathing problems please do not wait for this appointment and go to the Emergency room for  evaluation and treatment. You will undergo another oxygen screen before your infusion to ensure this is the best treatment option for you. There is a chance that the best decision may be to send you to the Emergency Room for evaluation at the time of your appointment.   The day of your visit you should: Adam Davis Get plenty of rest the night before and drink plenty of water . Eat a light meal/snack before coming and take your medications as prescribed  . Wear warm, comfortable clothes with a shirt that can roll-up over the elbow (will need IV start).  . Wear a mask  . Consider bringing some activity to help pass the time  Many commercial insurers are waiving bills related to COVID treatment however some have ranged from $300-640. We are starting to see some insurers send bills to patients later for the administration of the medication - we are learning more information but you may receive a bill after your appointment.  Please contact your insurance agent to discuss prior to your appointment if you would like further details about billing specific to your policy.   Marcos Eke, NP 05/01/2020 2:53 PM

## 2020-05-02 ENCOUNTER — Ambulatory Visit (HOSPITAL_COMMUNITY)
Admission: RE | Admit: 2020-05-02 | Discharge: 2020-05-02 | Disposition: A | Discharge status: Home / Self Care | Payer: Medicaid Other | Source: Ambulatory Visit | Attending: Pulmonary Disease | Admitting: Pulmonary Disease

## 2020-05-02 DIAGNOSIS — I10 Essential (primary) hypertension: Secondary | ICD-10-CM

## 2020-05-02 DIAGNOSIS — E139 Other specified diabetes mellitus without complications: Secondary | ICD-10-CM | POA: Insufficient documentation

## 2020-05-02 DIAGNOSIS — U071 COVID-19: Secondary | ICD-10-CM | POA: Insufficient documentation

## 2020-05-02 MED ORDER — ALBUTEROL SULFATE HFA 108 (90 BASE) MCG/ACT IN AERS: 2.0000 | INHALATION_SPRAY | Freq: Once | RESPIRATORY_TRACT | Status: DC | PRN

## 2020-05-02 MED ORDER — SODIUM CHLORIDE 0.9 % IV SOLN
1200.0000 mg | Freq: Once | INTRAVENOUS | Status: AC
  Administered 2020-05-02: 1200 mg via INTRAVENOUS
  Filled 2020-05-02: qty 10

## 2020-05-02 MED ORDER — SODIUM CHLORIDE 0.9 % IV SOLN: INTRAVENOUS | Status: DC | PRN

## 2020-05-02 MED ORDER — FAMOTIDINE IN NACL 20-0.9 MG/50ML-% IV SOLN: 20.0000 mg | Freq: Once | INTRAVENOUS | Status: DC | PRN

## 2020-05-02 MED ORDER — DIPHENHYDRAMINE HCL 50 MG/ML IJ SOLN: 50.0000 mg | Freq: Once | INTRAMUSCULAR | Status: DC | PRN

## 2020-05-02 MED ORDER — EPINEPHRINE 0.3 MG/0.3ML IJ SOAJ: 0.3000 mg | Freq: Once | INTRAMUSCULAR | Status: DC | PRN

## 2020-05-02 MED ORDER — METHYLPREDNISOLONE SODIUM SUCC 125 MG IJ SOLR: 125.0000 mg | Freq: Once | INTRAMUSCULAR | Status: DC | PRN

## 2020-05-02 NOTE — Progress Notes (Signed)
°  Diagnosis: COVID-19 ° °Physician: Dr. Wright ° °Procedure: Covid Infusion Clinic Med: casirivimab\imdevimab infusion - Provided patient with casirivimab\imdevimab fact sheet for patients, parents and caregivers prior to infusion. ° °Complications: No immediate complications noted. ° °Discharge: Discharged home  ° °Franklyn Cafaro E Talyah Seder °05/02/2020 ° ° °

## 2020-05-02 NOTE — Discharge Instructions (Signed)

## 2020-05-05 ENCOUNTER — Other Ambulatory Visit (HOSPITAL_COMMUNITY): Payer: Self-pay

## 2020-07-05 DIAGNOSIS — E1129 Type 2 diabetes mellitus with other diabetic kidney complication: Secondary | ICD-10-CM | POA: Diagnosis not present

## 2020-07-05 DIAGNOSIS — E785 Hyperlipidemia, unspecified: Secondary | ICD-10-CM | POA: Diagnosis not present

## 2020-07-05 DIAGNOSIS — Z940 Kidney transplant status: Secondary | ICD-10-CM | POA: Diagnosis not present

## 2020-07-05 DIAGNOSIS — I129 Hypertensive chronic kidney disease with stage 1 through stage 4 chronic kidney disease, or unspecified chronic kidney disease: Secondary | ICD-10-CM | POA: Diagnosis not present

## 2020-08-11 ENCOUNTER — Other Ambulatory Visit: Payer: Self-pay

## 2020-08-11 ENCOUNTER — Ambulatory Visit: Payer: Self-pay

## 2020-08-11 ENCOUNTER — Other Ambulatory Visit: Payer: Self-pay | Admitting: Occupational Medicine

## 2020-08-11 DIAGNOSIS — M79672 Pain in left foot: Secondary | ICD-10-CM

## 2020-09-27 ENCOUNTER — Ambulatory Visit: Payer: BLUE CROSS/BLUE SHIELD | Admitting: Podiatry

## 2020-11-02 ENCOUNTER — Other Ambulatory Visit: Payer: Self-pay | Admitting: Family Medicine

## 2020-11-02 DIAGNOSIS — E139 Other specified diabetes mellitus without complications: Secondary | ICD-10-CM

## 2020-11-25 ENCOUNTER — Telehealth: Payer: Self-pay

## 2020-11-25 DIAGNOSIS — I129 Hypertensive chronic kidney disease with stage 1 through stage 4 chronic kidney disease, or unspecified chronic kidney disease: Secondary | ICD-10-CM | POA: Diagnosis not present

## 2020-11-25 DIAGNOSIS — E785 Hyperlipidemia, unspecified: Secondary | ICD-10-CM | POA: Diagnosis not present

## 2020-11-25 DIAGNOSIS — R7309 Other abnormal glucose: Secondary | ICD-10-CM | POA: Diagnosis not present

## 2020-11-25 DIAGNOSIS — E1129 Type 2 diabetes mellitus with other diabetic kidney complication: Secondary | ICD-10-CM | POA: Diagnosis not present

## 2020-11-25 DIAGNOSIS — Z940 Kidney transplant status: Secondary | ICD-10-CM | POA: Diagnosis not present

## 2020-11-25 NOTE — Telephone Encounter (Signed)
    Pharmacy called and informed of PA approval. Pharmacist states that she will need to call insurance as [employer] is still applying coupon. Pharmacist will call back if there is anything else needed from our office to process request.   Veronda Prude, RN

## 2020-11-25 NOTE — Telephone Encounter (Signed)
Received fax from pharmacy, PA needed on basaglar.  Clinical questions submitted via Cover My Meds.  Waiting on response, could take up to 72 hours.   Cover My Meds info: Key:  [id number]  Veronda Prude, RN

## 2021-01-18 ENCOUNTER — Other Ambulatory Visit: Payer: Self-pay

## 2021-01-18 ENCOUNTER — Ambulatory Visit (INDEPENDENT_AMBULATORY_CARE_PROVIDER_SITE_OTHER): Payer: Self-pay | Admitting: Family Medicine

## 2021-01-18 VITALS — BP 102/70 | HR 98 | Temp 99.0°F | Ht 66.0 in | Wt 157.4 lb

## 2021-01-18 DIAGNOSIS — R109 Unspecified abdominal pain: Secondary | ICD-10-CM

## 2021-01-18 DIAGNOSIS — E139 Other specified diabetes mellitus without complications: Secondary | ICD-10-CM

## 2021-01-18 DIAGNOSIS — Z940 Kidney transplant status: Secondary | ICD-10-CM

## 2021-01-18 DIAGNOSIS — J069 Acute upper respiratory infection, unspecified: Secondary | ICD-10-CM

## 2021-01-18 DIAGNOSIS — Z789 Other specified health status: Secondary | ICD-10-CM

## 2021-01-18 LAB — POCT GLYCOSYLATED HEMOGLOBIN (HGB A1C): HbA1c, POC (controlled diabetic range): 9.8 % — AB (ref 0.0–7.0)

## 2021-01-18 NOTE — Progress Notes (Signed)
SUBJECTIVE:   CHIEF COMPLAINT / HPI:   Viral URI with cough Mr. Berent reports that he has been having some congestion and eye pressure since this past Friday, making this day 5 of symptoms.  He also reports subjective fever this past Friday and a mild cough.  He notices this cough most when he stands up after lying down for a nap or from sleeping.  He is suspicious that he may have allergies because his wife and child both suffer from seasonal allergies.  He has not previously had issues with allergies in his life.  He also specifically denies itchy eyes.  He denies shortness of breath.  Right upper quadrant discomfort He noticed that he occasionally has right upper abdominal/right subcostal pain that he describes as similar to a muscular ache.  He first noticed this pain about 1 month ago shortly after starting a statin medication prescribed to him by his nephrologist.  He informed his nephrologist of his discomfort and the medication was stopped shortly afterward.  He continues to have some discomfort and wants to know if anything more should be done.  Sadly, we cannot see the notes from his nephrologist because his nephrologist works with [employer].  PERTINENT  PMH / PSH: S/p kidney transplant, on immunosuppressant therapy  OBJECTIVE:   BP 102/70   Pulse 98   Temp 99 F (37.2 C) (Oral)   Ht 5\' 6"  (1.676 m)   Wt 157 lb 6 oz (71.4 kg)   SpO2 97%   BMI 25.40 kg/m    General: Alert and cooperative and appears to be in no acute distress HEENT: Mild erythema and swelling of the turbinates bilaterally.  Normal TMs visualized bilaterally.  No TMJ tenderness.  No evidence of tenderness with the maxillary or frontal sinuses.  No significant conjunctival injection.  No significant oropharyngeal erythema.  Cervical lymphadenopathy. Cardio: Normal S1 and S2, no S3 or S4. Rhythm is regular. No murmurs or rubs.   Pulm: Clear to auscultation bilaterally, no crackles, wheezing,  or diminished breath sounds. Normal respiratory effort Extremities: No peripheral edema. Warm/ well perfused.  Strong radial pulses. Neuro: Cranial nerves grossly intact  ASSESSMENT/PLAN:   Viral URI with cough I have a lower suspicion for allergies due to his lack of history of seasonal allergies and no evidence of allergic conjunctivitis.  I think his symptoms altogether are more fitting with a viral URI.  He does report his symptoms do seem to be improving and he simply needs help with symptom management while he waits for his body to continue to heal. -See AVS for additional patient advice  Right sided abdominal pain Sadly, we cannot see the notes from his nephrologist.  It is difficult to determine what this pain might be coming from.  Generally, liver injury does not cause significant right upper quadrant discomfort.  He does not have signs of peritonitis.  There is no acute abdomen.  He is not having any significant chest pain.  At this time, I think it would be reasonable to follow-up with a CMP mainly for the assessment of liver enzymes to see if he has any sustained liver injury related to the statin use.  It may be helpful to continue discussing this issue with his primary care provider. -Follow-up CMP  Diabetes mellitus following renal transplant A1c improved to 9.8 from 10.2 although still poorly controlled. -Follow-up with PCP for further diabetes management     , MD [facility]

## 2021-01-18 NOTE — Patient Instructions (Signed)
Viral upper respiratory tract infection: I think your symptoms are consistent with a cold.  I do not think we need to do any additional testing today.  I do not think these are allergies.  I do not think you need allergy medication.  I would recommend trying over-the-counter cough and cold medicine like Sudafed.  You can also try saline nasal spray to help clean out your nose.  Additionally, some people find Veneta Penton to be helpful.  This is a way to wash out your nose to help with congestion.  Liver concerns: I have a low suspicion that this right-sided pain is coming from your liver but I think it would be reasonable to get a blood test to look at your liver enzymes based on her story.  I will let you know if there are any abnormalities.  If everything is normal, I will send you a letter.  I would like you to follow-up with your primary care doctor in the next month.  We did get your A1c today which remains significantly elevated at 9.8.  This is better than your previous although you do need ongoing care to improve your diabetes control.

## 2021-01-18 NOTE — Assessment & Plan Note (Addendum)
A1c improved to 9.8 from 10.2 although still poorly controlled. -Follow-up with PCP for further diabetes management

## 2021-01-18 NOTE — Assessment & Plan Note (Signed)
Sadly, we cannot see the notes from his nephrologist.  It is difficult to determine what this pain might be coming from.  Generally, liver injury does not cause significant right upper quadrant discomfort.  He does not have signs of peritonitis.  There is no acute abdomen.  He is not having any significant chest pain.  At this time, I think it would be reasonable to follow-up with a CMP mainly for the assessment of liver enzymes to see if he has any sustained liver injury related to the statin use.  It may be helpful to continue discussing this issue with his primary care provider. -Follow-up CMP

## 2021-01-18 NOTE — Assessment & Plan Note (Signed)
I have a lower suspicion for allergies due to his lack of history of seasonal allergies and no evidence of allergic conjunctivitis.  I think his symptoms altogether are more fitting with a viral URI.  He does report his symptoms do seem to be improving and he simply needs help with symptom management while he waits for his body to continue to heal. -See AVS for additional patient advice

## 2021-01-19 LAB — COMPREHENSIVE METABOLIC PANEL
ALT: 36 IU/L (ref 0–44)
AST: 25 IU/L (ref 0–40)
Albumin/Globulin Ratio: 1.3 (ref 1.2–2.2)
Albumin: 3.4 g/dL — ABNORMAL LOW (ref 4.0–5.0)
Alkaline Phosphatase: 73 IU/L (ref 44–121)
BUN/Creatinine Ratio: 11 (ref 9–20)
BUN: 26 mg/dL — ABNORMAL HIGH (ref 6–24)
Bilirubin Total: 0.3 mg/dL (ref 0.0–1.2)
CO2: 19 mmol/L — ABNORMAL LOW (ref 20–29)
Calcium: 8.6 mg/dL — ABNORMAL LOW (ref 8.7–10.2)
Chloride: 100 mmol/L (ref 96–106)
Creatinine, Ser: 2.38 mg/dL — ABNORMAL HIGH (ref 0.76–1.27)
Globulin, Total: 2.6 g/dL (ref 1.5–4.5)
Glucose: 403 mg/dL — ABNORMAL HIGH (ref 65–99)
Potassium: 4.8 mmol/L (ref 3.5–5.2)
Sodium: 136 mmol/L (ref 134–144)
Total Protein: 6 g/dL (ref 6.0–8.5)
eGFR: 33 mL/min/{1.73_m2} — ABNORMAL LOW (ref >59)

## 2021-01-20 ENCOUNTER — Encounter: Payer: Self-pay | Admitting: Family Medicine

## 2021-01-21 IMAGING — DX DG WRIST COMPLETE 3+V*R*
4 series · 4 of 4 positions shown · non-contrast
Comparison: None.

CLINICAL DATA: Pain after repetitive motion

EXAM:
RIGHT WRIST - COMPLETE 3+ VIEW

[wrist pa]
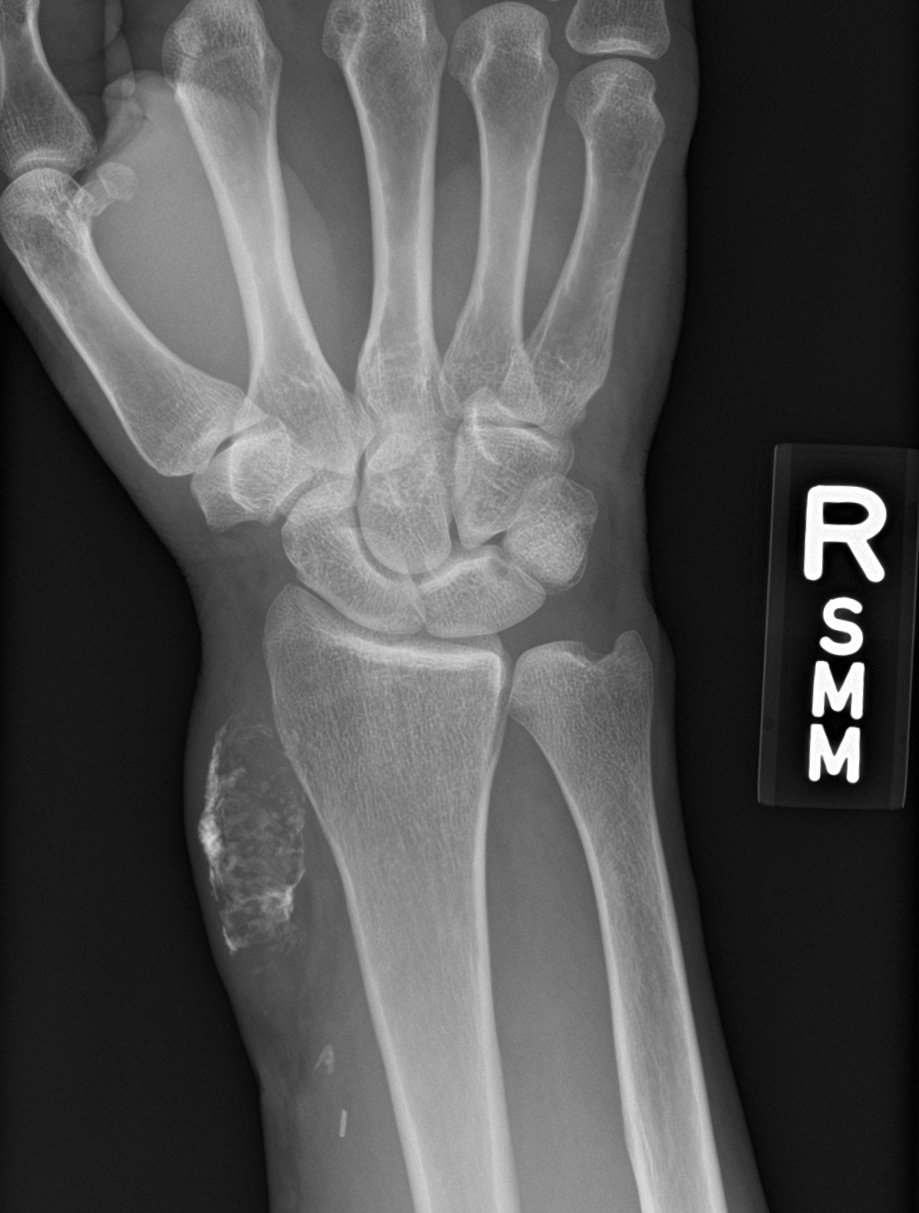

[wrist navicular]
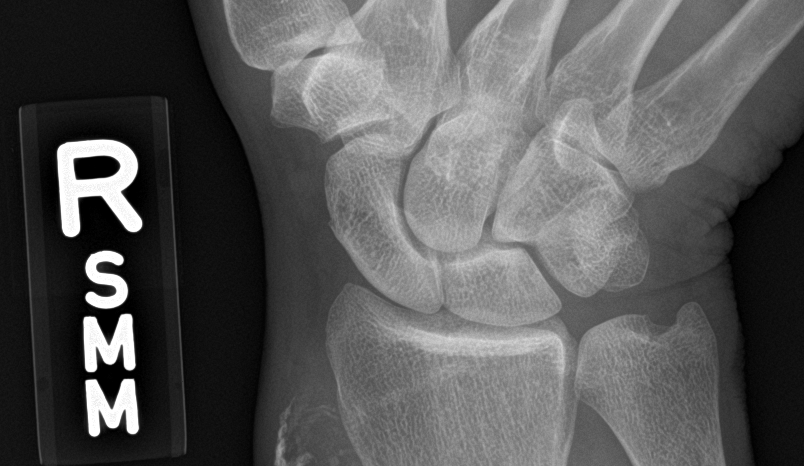

[wrist obl]
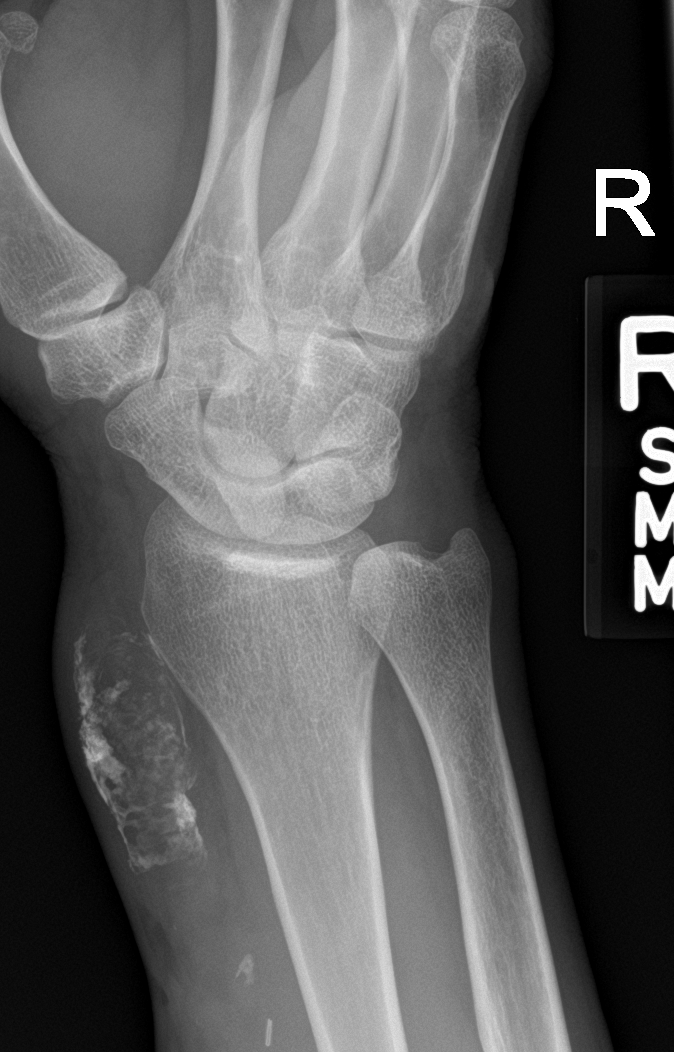

[wrist lat]
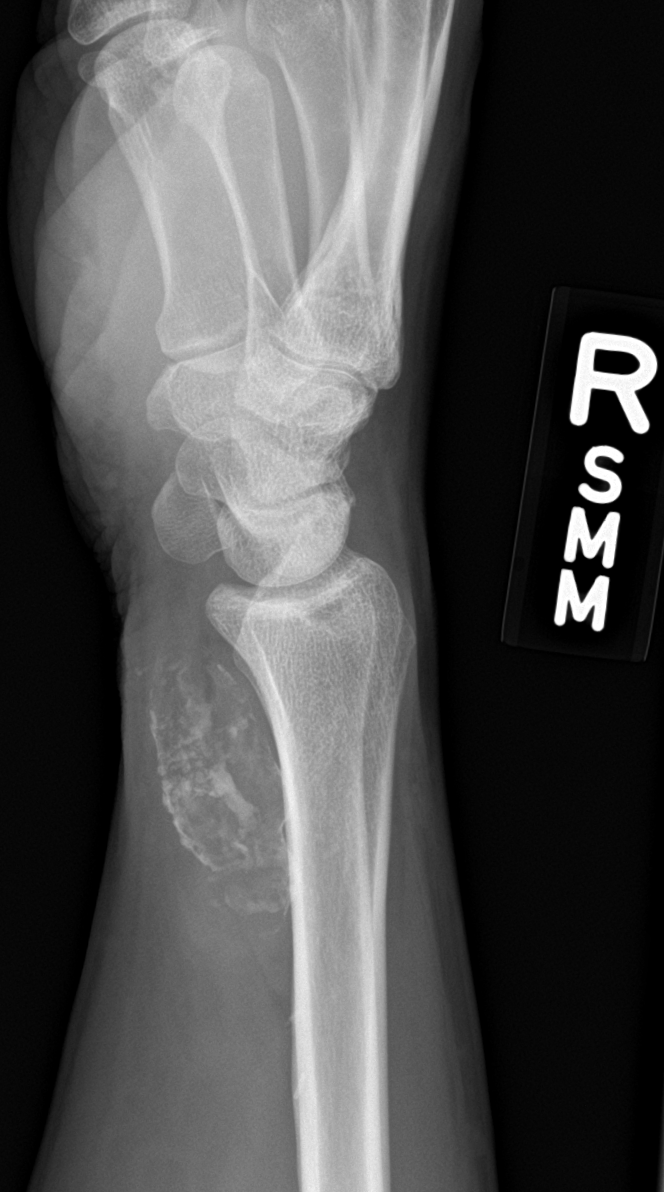

[4 of 4 positions shown; findings below may reference images not displayed]

FINDINGS: Frontal, oblique, lateral, and ulnar deviation scaphoid images were
obtained. No fracture or dislocation. No joint space narrowing or
erosion.

There is soft tissue calcification lateral and volar to the distal
radius which appears tubular and may be vascular.
IMPRESSION: Tubular appearing calcification lateral and volar to the distal
right radius. This calcification may be of vascular etiology. If
this area represents a partially calcified vessel, this vessel is
dilated significantly. Note that there is associated soft tissue
swelling in this area. It may be prudent to consider nonemergent
duplex ultrasound of this area to further assess.

No fracture or dislocation.  No appreciable arthropathy.

## 2021-02-02 ENCOUNTER — Ambulatory Visit: Payer: Medicaid Other | Admitting: Family Medicine

## 2021-02-02 NOTE — Progress Notes (Deleted)
    SUBJECTIVE:   CHIEF COMPLAINT / HPI:   AKI  CMP collected at prior visit to assess liver function as patient had complained about RUQ pain. Lab work showed increased creatinine of 2.38 elevated from 1.44 last year.   Type 2 DM Recent A1c of 9.8, improved from 10.2. Home medications include ***. Compliance is ***. Home CBG's ranging ***. Diet is ***. Exercise ***. Eye exam***. Foot exam***.  - Microalbumin: Will do today - Statin: *** - Denies symptoms of hypoglycemia, polyuria, polydipsia, numbness extremities, foot ulcers/trauma   PERTINENT  PMH / PSH:  Past Medical History:  Diagnosis Date  . Diabetes mellitus   . H/O acute post-streptococcal glomerulonephritis 1997  . History of renal transplant   . Hyperlipidemia   . Hypertension   . Onychomycosis of toenail   . Renal disorder   . Tachycardia       OBJECTIVE:   There were no vitals taken for this visit.   General: NAD, pleasant, able to participate in exam Cardiac: RRR, no murmurs. Respiratory: CTAB, normal effort, No wheezes, rales or rhonchi Abdomen: Bowel sounds present, nontender, nondistended, no hepatosplenomegaly. Extremities: no edema or cyanosis. Skin: warm and dry, no rashes noted Neuro: alert, no obvious focal deficits Psych: Normal affect and mood  ASSESSMENT/PLAN:   No problem-specific Assessment & Plan notes found for this encounter.     Sabino Dick, DO [facility]

## 2021-02-08 NOTE — Progress Notes (Signed)
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 Lab work showed increased creatinine of 2.38 elevated from 1.44 last year. Has left transplanted kidney since 2006. Previously on dialysis for 9 years. Drinks plenty of water.   Type 2 DM Recent A1c of 9.8, improved from 10.2. Home medications include Insluin glargine 50U, Farxiga 10 mg daily (new medication that was started 2 months ago). Stopped Metformin and has not been taking Humalog. Reports good compliance with Insulin and Farxiga. Home CBG's ranging 140-150 in the morning, goes up to high 300's. Diet is heavy in bread. Exercise- has gym membership, also works in a warehouse. In need of diabetic eye exam. Checks feet daily- concerned about fungus (see below).  - Microalbumin: Will do today - Statin: Not on. Reports previously on one but had myalgias. Was told to stop and start red yeast rice but then that started to affect his liver.   Onychomycosis  Present x 6 months-1 year. Using over the counter spray and fungus toe nail without relief.    PERTINENT  PMH / PSH:  Past Medical History:  Diagnosis Date   Diabetes mellitus    H/O acute post-streptococcal glomerulonephritis 1997   History of renal transplant    Hyperlipidemia    Hypertension    Onychomycosis of toenail    Renal disorder    Tachycardia       OBJECTIVE:   BP (!) 128/98 Comment: Right arm  Pulse 96   Ht 5\' 6"  (1.676 m)   Wt 161 lb 8 oz (73.3 kg)   SpO2 100%   BMI 26.07 kg/m    General: NAD, pleasant, able to participate in exam Cardiac: RRR, no murmurs. Respiratory: CTAB, normal effort, No wheezes, rales or rhonchi Extremities: no edema or cyanosis. 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 Onychomycosis to bilateral toes Neuro: alert, no obvious focal deficits Psych: Normal affect and mood     ASSESSMENT/PLAN:    Onychomycosis of toenail Started on oral terbinafine.  No renal adverse effects for renal adjustment needed.  Discussed that he would likely not see short-term improvement. -Terbinafine 250 mg x 12 weeks  -Can consider referral to derm clinic/podiatry for toe nail clipping  Type 2 diabetes mellitus with hyperglycemia (HCC) Most recent A1c 9.8.  He is not on metformin, only taking insulin glargine 50 units and Farxiga 10 mg daily.  He has stopped his 15 units of Humalog.  Reports that he was having low blood sugars with this.  I do believe he would benefit from some mealtime insulin given his last BMP showed glucose of over 400.  Also, given his history of ESRD, would like to have his diabetes be better controlled. -Referral to ophthalmologist for diabetic eye exam -Urine microalbumin today; he is not on ACE-I/ARB but should be. Unable to get BMP today given lab closure and given his recent AKI have opted to not start today -Referral to pharmacy clinic for assistance with diabetic management -Lipid panel today; not on statin but should be.  Reports some intolerance to statin in the past, unsure which.  AKI (acute kidney injury) (HCC) Following up for AKI.  Unfortunately, unable to get BMP today due to lab closure.  Patient is scheduled for lab appointment tomorrow.  We will follow-up BMP.  Reviewed medications today, does not appear to have any renally  cleared medications on board at this time.     Sabino Dick, DO [facility]

## 2021-02-09 ENCOUNTER — Ambulatory Visit (INDEPENDENT_AMBULATORY_CARE_PROVIDER_SITE_OTHER): Payer: Medicaid Other | Admitting: Family Medicine

## 2021-02-09 ENCOUNTER — Encounter: Payer: Self-pay | Admitting: Family Medicine

## 2021-02-09 ENCOUNTER — Other Ambulatory Visit: Payer: Self-pay

## 2021-02-09 VITALS — BP 128/98 | HR 96 | Ht 66.0 in | Wt 161.5 lb

## 2021-02-09 DIAGNOSIS — Z940 Kidney transplant status: Secondary | ICD-10-CM

## 2021-02-09 DIAGNOSIS — Z794 Long term (current) use of insulin: Secondary | ICD-10-CM

## 2021-02-09 DIAGNOSIS — E1165 Type 2 diabetes mellitus with hyperglycemia: Secondary | ICD-10-CM | POA: Insufficient documentation

## 2021-02-09 DIAGNOSIS — N186 End stage renal disease: Secondary | ICD-10-CM | POA: Diagnosis present

## 2021-02-09 DIAGNOSIS — E139 Other specified diabetes mellitus without complications: Secondary | ICD-10-CM

## 2021-02-09 DIAGNOSIS — Z1159 Encounter for screening for other viral diseases: Secondary | ICD-10-CM

## 2021-02-09 DIAGNOSIS — Z114 Encounter for screening for human immunodeficiency virus [HIV]: Secondary | ICD-10-CM

## 2021-02-09 DIAGNOSIS — B351 Tinea unguium: Secondary | ICD-10-CM

## 2021-02-09 DIAGNOSIS — N179 Acute kidney failure, unspecified: Secondary | ICD-10-CM | POA: Insufficient documentation

## 2021-02-09 MED ORDER — TERBINAFINE HCL 250 MG PO TABS: 250.0000 mg | ORAL_TABLET | Freq: Every day | ORAL | 0 refills | Status: DC

## 2021-02-09 NOTE — Assessment & Plan Note (Signed)
Following up for AKI.  Unfortunately, unable to get BMP today due to lab closure.  Patient is scheduled for lab appointment tomorrow.  We will follow-up BMP.  Reviewed medications today, does not appear to have any renally cleared medications on board at this time.

## 2021-02-09 NOTE — Assessment & Plan Note (Addendum)
Most recent A1c 9.8.  He is not on metformin, only taking insulin glargine 50 units and Farxiga 10 mg daily.  He has stopped his 15 units of Humalog.  Reports that he was having low blood sugars with this.  I do believe he would benefit from some mealtime insulin given his last BMP showed glucose of over 400.  Also, given his history of ESRD, would like to have his diabetes be better controlled. -Referral to ophthalmologist for diabetic eye exam -Urine microalbumin today; he is not on ACE-I/ARB but should be. Unable to get BMP today given lab closure and given his recent AKI have opted to not start today -Referral to pharmacy clinic for assistance with diabetic management -Lipid panel today; not on statin but should be.  Reports some intolerance to statin in the past, unsure which.

## 2021-02-09 NOTE — Patient Instructions (Addendum)
It was wonderful to see you today.  Please bring ALL of your medications with you to every visit.   Today we talked about:  Unfortunately, the lab closed today before able to get lab work.  All the orders are pended see can come in later this week for these labs.  I will let you know the results via letter if normal or I will call you if abnormal.  I sent a referral for ophthalmologist for diabetic eye exam.  Restarting on medication called terbinafine for the fungus in your toes.  You will need to take this for 12 weeks, it will take some time before you see any resolution.  I scheduled you an appointment with our pharmacy team for help in managing your diabetes.   Thank you for choosing [facility].   Please call [phone] with any questions about today's appointment.  Please be sure to schedule follow up at the front  desk before you leave today.   Sabino Dick, DO PGY-1 Family Medicine

## 2021-02-09 NOTE — Assessment & Plan Note (Signed)
>>  ASSESSMENT AND PLAN FOR AKI (ACUTE KIDNEY INJURY) (HCC) WRITTEN ON 02/09/2021  5:22 PM BY Melba Coon, [name]  Following up for AKI.  Unfortunately, unable to get BMP today due to lab closure.  Patient is scheduled for lab appointment tomorrow.  We will follow-up BMP.  Reviewed medications today, does not appear to have any renally cleared medications on board at this time.

## 2021-02-09 NOTE — Assessment & Plan Note (Signed)
Started on oral terbinafine.  No renal adverse effects for renal adjustment needed.  Discussed that he would likely not see short-term improvement. -Terbinafine 250 mg x 12 weeks  -Can consider referral to derm clinic/podiatry for toe nail clipping

## 2021-02-10 ENCOUNTER — Other Ambulatory Visit: Payer: Medicaid Other

## 2021-02-10 DIAGNOSIS — E139 Other specified diabetes mellitus without complications: Secondary | ICD-10-CM

## 2021-02-10 DIAGNOSIS — Z1159 Encounter for screening for other viral diseases: Secondary | ICD-10-CM

## 2021-02-10 DIAGNOSIS — Z114 Encounter for screening for human immunodeficiency virus [HIV]: Secondary | ICD-10-CM

## 2021-02-11 LAB — MICROALBUMIN / CREATININE URINE RATIO
Creatinine, Urine: 106.6 mg/dL
Microalb/Creat Ratio: 1387 mg/g creat — ABNORMAL HIGH (ref 0–29)
Microalbumin, Urine: 1478.7 ug/mL

## 2021-02-11 LAB — BASIC METABOLIC PANEL
BUN/Creatinine Ratio: 12 (ref 9–20)
BUN: 23 mg/dL (ref 6–24)
CO2: 24 mmol/L (ref 20–29)
Calcium: 8.8 mg/dL (ref 8.7–10.2)
Chloride: 101 mmol/L (ref 96–106)
Creatinine, Ser: 1.97 mg/dL — ABNORMAL HIGH (ref 0.76–1.27)
Glucose: 96 mg/dL (ref 65–99)
Potassium: 3.4 mmol/L — ABNORMAL LOW (ref 3.5–5.2)
Sodium: 138 mmol/L (ref 134–144)
eGFR: 42 mL/min/{1.73_m2} — ABNORMAL LOW (ref >59)

## 2021-02-11 LAB — LIPID PANEL
Chol/HDL Ratio: 5.4 ratio — ABNORMAL HIGH (ref 0.0–5.0)
Cholesterol, Total: 232 mg/dL — ABNORMAL HIGH (ref 100–199)
HDL: 43 mg/dL (ref >39)
LDL Chol Calc (NIH): 163 mg/dL — ABNORMAL HIGH (ref 0–99)
Triglycerides: 143 mg/dL (ref 0–149)
VLDL Cholesterol Cal: 26 mg/dL (ref 5–40)

## 2021-02-11 LAB — HIV ANTIBODY (ROUTINE TESTING W REFLEX): HIV Screen 4th Generation wRfx: NONREACTIVE

## 2021-02-11 LAB — HCV AB W REFLEX TO QUANT PCR: HCV Ab: 0.2 s/co ratio (ref 0.0–0.9)

## 2021-02-11 LAB — HCV INTERPRETATION

## 2021-02-12 ENCOUNTER — Other Ambulatory Visit: Payer: Self-pay | Admitting: Family Medicine

## 2021-02-12 DIAGNOSIS — E785 Hyperlipidemia, unspecified: Secondary | ICD-10-CM

## 2021-02-12 MED ORDER — ROSUVASTATIN CALCIUM 20 MG PO TABS: 20.0000 mg | ORAL_TABLET | Freq: Every day | ORAL | 3 refills | Status: DC

## 2021-02-12 NOTE — Progress Notes (Signed)
Previous intolerance to atorvastatin. Has not tried other statins. Given poorly controlled diabetes and hyperlipidemia, patient should be on high-intensity statin. Discussed benefits vs risks with patient over the phone. He is amenable to trying Rosuvastatin. Will reassess lipid panel in 4-12 weeks. Discussed with patient to stop medication and come in if he develops myalgias.

## 2021-03-03 ENCOUNTER — Ambulatory Visit: Payer: Medicaid Other | Admitting: Pharmacist

## 2021-03-10 ENCOUNTER — Ambulatory Visit: Payer: Medicaid Other | Admitting: Pharmacist

## 2021-03-31 ENCOUNTER — Other Ambulatory Visit: Payer: Self-pay

## 2021-03-31 ENCOUNTER — Encounter: Payer: Self-pay | Admitting: Pharmacist

## 2021-03-31 ENCOUNTER — Ambulatory Visit (INDEPENDENT_AMBULATORY_CARE_PROVIDER_SITE_OTHER): Payer: BC Managed Care – PPO | Admitting: Pharmacist

## 2021-03-31 DIAGNOSIS — E785 Hyperlipidemia, unspecified: Secondary | ICD-10-CM | POA: Diagnosis not present

## 2021-03-31 DIAGNOSIS — E139 Other specified diabetes mellitus without complications: Secondary | ICD-10-CM | POA: Diagnosis not present

## 2021-03-31 DIAGNOSIS — Z940 Kidney transplant status: Secondary | ICD-10-CM

## 2021-03-31 LAB — GLUCOSE, POCT (MANUAL RESULT ENTRY): POC Glucose: 63 mg/dl — AB (ref 70–99)

## 2021-03-31 MED ORDER — INSULIN GLARGINE-YFGN 100 UNIT/ML ~~LOC~~ SOPN: 46.0000 [IU] | PEN_INJECTOR | Freq: Every day | SUBCUTANEOUS | Status: DC

## 2021-03-31 NOTE — Progress Notes (Signed)
Reviewed: I agree with Dr. Koval's documentation and management. 

## 2021-03-31 NOTE — Assessment & Plan Note (Signed)
ASCVD risk - primary prevention in patient with diabetes. Last LDL of 163 is not controlled. Was started on rosuvastatin 20 mg daily but experienced myalgias and decreased to MWF and myalgias have resolved -Continue rosuvastatin 20 mg MWF -Recommend rechecking fasting lipid panel at next PCP visit as it will be >2 months since medication change

## 2021-03-31 NOTE — Patient Instructions (Signed)
It was nice to see you today!  Your goal blood sugar is 80-130 before eating and less than 180 after eating.  Medication Changes: Decrease Semglee (your insulin) to 46 units daily  Continue Farxiga 10 mg daily  Make sure you eat before working out and try not to skip meals throughout the day to prevent having low blood sugars.   Monitor blood sugars at home and keep a log (glucometer or piece of paper) to bring with you to your next visit.  Keep up the good work with diet and exercise. Aim for a diet full of vegetables, fruit and lean meats (chicken, [place], fish). Try to limit salt intake by eating fresh or frozen vegetables (instead of canned), rinse canned vegetables prior to cooking and do not add any additional salt to meals.    Make an appointment with your PCP in ~1 month.

## 2021-03-31 NOTE — Assessment & Plan Note (Signed)
Diabetes currently uncontrolled based on most recent A1c of 9.8 (01/17/21) however BG improving with recent BG at goal per patient report. Patient is able to verbalize appropriate hypoglycemia management plan. Medication adherence appears appropriate. BG have improved since addition of Farxiga, however concerned about frequency of hypoglycemic events ~2x/week so will slightly decrease insulin. Discussed with Dr. McDiarmid, do not suspect rib/back pain is related to [facility] given that the pain is intermittent and he will discuss this with his PCP at next visit. Patient thinks this may be related to weight lifting at work.  -Decrease Semglee (insulin glargine) to 46 units daily -Continue Farxiga (dapagliflozin) 10 mg daily -Encouraged patient to not miss meals and to eat before working out -Extensively discussed pathophysiology of diabetes, recommended lifestyle interventions, dietary effects on blood sugar control -Counseled on s/sx of and management of hypoglycemia -Next A1C anticipated later this month (August) at next PCP visit.

## 2021-03-31 NOTE — Progress Notes (Signed)
S:     Chief Complaint  Patient presents with   Medication Management    Diabetes Follow Up   Patient arrives in good spirits.  Presents for diabetes evaluation, education, and management Patient was referred and last seen by Primary Care Provider, Dr. Melba Coon, on 02/09/21. Reports feeling "woozy" today, thinks his blood sugar is low because he missed his afternoon snack/meal today due to leaving work early for his appointment. Checked POCT BG in clinic today which is 68. Reports he experiences low blood sugars about twice per week when he doesn't eat or after working out. Otherwise, reports fasting BG this morning of 120; 180s-190s after eating. Experiencing less low blood sugar events since stopping Humalog about a year ago. Endorses intermittent pain over the last two months in his upper right rib cage that sometimes is in his back, though it is not daily. Endorses feeling pressure there when he is full and wonders if this is related to [place]. 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 Reports his nephrologist checked his labs recently which were all good. He previously experienced myalgias with atorvastatin. He started taking rosuvastatin 20 mg daily but experienced some myalgias so he decreased to only taking it on MWF and he does not experience myalgias. He sometimes checks his BP at the pharmacy, last was 124/96, but does not check often at home.   Insurance coverage/medication affordability: BCBS (commercial)  Medication adherence reported as daily with all medications except rosuvastatin (taking MWF rather than daily, as above).   Current diabetes medications include: Semglee (insulin glargine) 50 units QHS, Farxiga (dapagliflozin) 10 mg daily Current hypertension medications include: amlodipine 10 mg daily Current hyperlipidemia medications include: rosuvastatin 20 mg daily (taking 20 mg MWF)  Patient reports hypoglycemic events. 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 Feels shaky, woozy when this occurs.   Patient reported dietary habits: Eats ~3 meals/day 6:30am iced coffee before work 10:30am bagel during 1st break at work 2:30pm protein drink during 2nd break at work 7:30pm baked chicken, rice, broccoli Snacks: when feels BG low, drinks gatorade/OJ/honey bun Drinks: gatorade, orange juice, plenty of water  Patient-reported exercise habits: works out at [employer], does heavy lifting at work   Patient reports nocturia (nighttime urination). Once/night when BG good, 3-4 times when BG high (180s-190s) Patient denies neuropathy (nerve pain). Patient denies visual changes. Patient reports self foot exams.   O:  Physical Exam Vitals reviewed.  Constitutional:      Appearance: Normal appearance. He is normal weight.  Cardiovascular:     Rate and Rhythm: Normal rate.  Pulmonary:     Effort: Pulmonary effort is normal.  Neurological:     Mental Status: He is alert.  Psychiatric:        Mood and Affect: Mood normal.        Behavior: Behavior normal.        Thought Content: Thought content normal.        Judgment: Judgment normal.   Review of Systems  Gastrointestinal:  Positive for abdominal pain (Endorses pain in upper right abdomen/ribs, sometimes in his back, for the last 2 months but not daily.).  Musculoskeletal:  Positive for back pain and myalgias (when he takes rosuvastatin daily).  All other systems reviewed and are negative.  Lab Results  Component Value Date   HGBA1C 9.8 (A) 01/18/2021   There were no vitals filed for this visit.  Lipid Panel  Component Value Date/Time   CHOL 232 (H) 02/10/2021 1614   TRIG 143 02/10/2021 1614   HDL 43 02/10/2021 1614   CHOLHDL 5.4 (H) 02/10/2021 1614   CHOLHDL 6.4 07/04/2012 0957   VLDL 55 (H) 07/04/2012 0957   LDLCALC 163 (H) 02/10/2021 1614    Home fasting blood sugars: 89, 120 (not checking daily) 2 hour post-meal/random blood sugars: 180s-190s  Clinical  Atherosclerotic Cardiovascular Disease (ASCVD): No  The 10-year ASCVD risk score Denman George DC Jr., et al., 2013) is: 14.4%   Values used to calculate the score:     Age: 27 years     Sex: Male     Is Non-Hispanic African American: Yes     Diabetic: Yes     Tobacco smoker: No     Systolic Blood Pressure: 128 mmHg     Is BP treated: Yes     HDL Cholesterol: 43 mg/dL     Total Cholesterol: 232 mg/dL   PHQ-9 Score: 0  A/P: Diabetes currently uncontrolled based on most recent A1c of 9.8 (01/17/21) however BG improving with recent BG at goal per patient report. Patient is able to verbalize appropriate hypoglycemia management plan. Medication adherence appears appropriate. BG have improved since addition of Farxiga, however concerned about frequency of hypoglycemic events ~2x/week so will slightly decrease insulin. Discussed with Dr. McDiarmid, do not suspect rib/back pain is related to [facility] given that the pain is intermittent and he will discuss this with his PCP at next visit. Patient thinks this may be related to weight lifting at work.  -Decrease Semglee (insulin glargine) to 46 units daily -Continue Farxiga (dapagliflozin) 10 mg daily -Encouraged patient to not miss meals and to eat before working out -Extensively discussed pathophysiology of diabetes, recommended lifestyle interventions, dietary effects on blood sugar control -Counseled on s/sx of and management of hypoglycemia -Next A1C anticipated later this month (August) at next PCP visit.   ASCVD risk - primary prevention in patient with diabetes. Last LDL of 163 is not controlled. Was started on rosuvastatin 20 mg daily but experienced myalgias and decreased to MWF and myalgias have resolved -Continue rosuvastatin 20 mg MWF -Recommend rechecking fasting lipid panel at next PCP visit as it will be >2 months since medication change  Hypertension longstanding currently nearly at goal with BP in office today of 125/86.  Blood pressure goal  = <130/80 mmHg. Medication adherence reported as daily.  -Continue amlodipine 10 mg daily  Written patient instructions provided.  Total time in face to face counseling 28 minutes.   Follow up PCP Clinic Visit in 1 month.   Patient seen with Shellia Carwin, PharmD Candidate, Valeda Malm, PharmD - PGY-1, and Pervis Hocking, PharmD - PGY2 Pharmacy Resident.

## 2021-05-11 DIAGNOSIS — E1122 Type 2 diabetes mellitus with diabetic chronic kidney disease: Secondary | ICD-10-CM | POA: Diagnosis not present

## 2021-05-11 DIAGNOSIS — Z940 Kidney transplant status: Secondary | ICD-10-CM | POA: Diagnosis not present

## 2021-05-11 DIAGNOSIS — I129 Hypertensive chronic kidney disease with stage 1 through stage 4 chronic kidney disease, or unspecified chronic kidney disease: Secondary | ICD-10-CM | POA: Diagnosis not present

## 2021-05-11 DIAGNOSIS — E785 Hyperlipidemia, unspecified: Secondary | ICD-10-CM | POA: Diagnosis not present

## 2021-09-07 DIAGNOSIS — E1129 Type 2 diabetes mellitus with other diabetic kidney complication: Secondary | ICD-10-CM | POA: Diagnosis not present

## 2021-09-07 DIAGNOSIS — I129 Hypertensive chronic kidney disease with stage 1 through stage 4 chronic kidney disease, or unspecified chronic kidney disease: Secondary | ICD-10-CM | POA: Diagnosis not present

## 2021-09-07 DIAGNOSIS — E785 Hyperlipidemia, unspecified: Secondary | ICD-10-CM | POA: Diagnosis not present

## 2021-09-07 DIAGNOSIS — Z940 Kidney transplant status: Secondary | ICD-10-CM | POA: Diagnosis not present

## 2021-10-31 ENCOUNTER — Other Ambulatory Visit: Payer: Self-pay | Admitting: Family Medicine

## 2021-10-31 DIAGNOSIS — E785 Hyperlipidemia, unspecified: Secondary | ICD-10-CM

## 2021-11-09 ENCOUNTER — Other Ambulatory Visit: Payer: Self-pay | Admitting: Family Medicine

## 2021-11-09 DIAGNOSIS — Z940 Kidney transplant status: Secondary | ICD-10-CM

## 2021-12-08 ENCOUNTER — Ambulatory Visit (INDEPENDENT_AMBULATORY_CARE_PROVIDER_SITE_OTHER): Payer: BC Managed Care – PPO | Admitting: Family Medicine

## 2021-12-08 VITALS — BP 132/84 | HR 85 | Ht 66.0 in | Wt 161.6 lb

## 2021-12-08 DIAGNOSIS — E1165 Type 2 diabetes mellitus with hyperglycemia: Secondary | ICD-10-CM | POA: Diagnosis not present

## 2021-12-08 DIAGNOSIS — M545 Low back pain, unspecified: Secondary | ICD-10-CM | POA: Diagnosis not present

## 2021-12-08 DIAGNOSIS — Z794 Long term (current) use of insulin: Secondary | ICD-10-CM

## 2021-12-08 DIAGNOSIS — E139 Other specified diabetes mellitus without complications: Secondary | ICD-10-CM

## 2021-12-08 DIAGNOSIS — Z940 Kidney transplant status: Secondary | ICD-10-CM

## 2021-12-08 LAB — POCT URINALYSIS DIP (MANUAL ENTRY)
Bilirubin, UA: NEGATIVE
Glucose, UA: >=1000 mg/dL — AB
Ketones, POC UA: NEGATIVE mg/dL
Leukocytes, UA: NEGATIVE
Nitrite, UA: NEGATIVE
Protein Ur, POC: >=300 mg/dL — AB
Spec Grav, UA: 1.02 (ref 1.010–1.025)
Urobilinogen, UA: 0.2 E.U./dL
pH, UA: 5.5 (ref 5.0–8.0)

## 2021-12-08 LAB — POCT UA - MICROSCOPIC ONLY
Epithelial cells, urine per micros: NONE SEEN
WBC, Ur, HPF, POC: NONE SEEN (ref 0–5)

## 2021-12-08 LAB — POCT GLYCOSYLATED HEMOGLOBIN (HGB A1C): HbA1c, POC (controlled diabetic range): 9 % — AB (ref 0.0–7.0)

## 2021-12-08 MED ORDER — TRULICITY 0.75 MG/0.5ML ~~LOC~~ SOAJ: 0.7500 mg | SUBCUTANEOUS | 3 refills | Status: DC

## 2021-12-08 MED ORDER — INSULIN GLARGINE-YFGN 100 UNIT/ML ~~LOC~~ SOPN: 50.0000 [IU] | PEN_INJECTOR | Freq: Every day | SUBCUTANEOUS | 0 refills | Status: DC

## 2021-12-08 MED ORDER — INSULIN GLARGINE-YFGN 100 UNIT/ML ~~LOC~~ SOPN: 40.0000 [IU] | PEN_INJECTOR | Freq: Every day | SUBCUTANEOUS | 0 refills | Status: DC

## 2021-12-08 NOTE — Progress Notes (Signed)
? ? ?  SUBJECTIVE:  ? ?CHIEF COMPLAINT / HPI:  ? ?Diabetes checkup ?Patient reports good compliance with diabetes regimen.  Currently taking 50 units of Semglee daily.  Denies any hypoglycemic events although does report that he is seeing blood sugars in the 70s on current regimen.  Is also compliant with [place].  Last hemoglobin A1c was 9.8 ? ?Kidney concerns ?Patient is concerned about kidney.  Reports that last week he was having back pain in the location of his kidney.  Denied any difficulty urinating but was concerned about a possible kidney infection.  He reports that he has not seen [facility] for approximately 3 or 4 months but last time he saw them his creatinine was around 2.  Denies any fever.  Reports that the pain in his back resolved today. ? ?OBJECTIVE:  ? ?BP 132/84   Pulse 85   Ht 5\' 6"  (1.676 m)   Wt 161 lb 9.6 oz (73.3 kg)   SpO2 98%   BMI 26.08 kg/m?   ?General: Well-appearing 46 year old male ?Cardiac: Regular rate and rhythm ?Respiratory: Normal work of breathing ?Abdomen: Soft, nontender, no costovertebral angle tenderness ?MSK: No gross abnormalities ? ? ?ASSESSMENT/PLAN:  ? ?Type 2 diabetes mellitus with hyperglycemia (HCC) ?Hemoglobin A1c today of 9.8.  Currently on Semglee 50 units daily and Farxiga 10 mg daily.  Reports good compliance with this.  Discussed options for medication management. ?- Trulicity started today ?- Decrease Semglee to 40 units daily and titrate as needed ?- BMP collected today ?- Follow-up in 1 month to discuss titration of Semglee ?- Return precautions given ? ?Low back pain without sciatica ?Pain has resolved today.  Concern for kidney infection.  Urinalysis showing glucose, protein, small RBCs.  Will check BMP today.  Do not feel infection is likely.  Recommend follow-up with nephrologist.  Strict return precautions given. ?  ? ? ?49, MD ?[facility]  ? ?

## 2021-12-08 NOTE — Patient Instructions (Signed)
It was great seeing you today!  Your hemoglobin A1c was 9.0.  I want to decrease your insulin to 40 units daily and we have started you on a medication called Trulicity.  I am checking your kidney function as well as checking a urine for bacteria.  I strongly recommend you follow-up with your kidney doctor.  If you have any questions or concerns call the clinic.  I hope you have a great day! ? ? ?

## 2021-12-08 NOTE — Assessment & Plan Note (Signed)
Hemoglobin A1c today of 9.8.  Currently on Semglee 50 units daily and Farxiga 10 mg daily.  Reports good compliance with this.  Discussed options for medication management. ?- Trulicity started today ?- Decrease Semglee to 40 units daily and titrate as needed ?- BMP collected today ?- Follow-up in 1 month to discuss titration of Semglee ?- Return precautions given ?

## 2021-12-08 NOTE — Assessment & Plan Note (Signed)
Pain has resolved today.  Concern for kidney infection.  Urinalysis showing glucose, protein, small RBCs.  Will check BMP today.  Do not feel infection is likely.  Recommend follow-up with nephrologist.  Strict return precautions given. ?

## 2021-12-09 ENCOUNTER — Other Ambulatory Visit (HOSPITAL_COMMUNITY): Payer: Self-pay

## 2021-12-09 ENCOUNTER — Telehealth: Payer: Self-pay

## 2021-12-09 LAB — BASIC METABOLIC PANEL
BUN/Creatinine Ratio: 10 (ref 9–20)
BUN: 22 mg/dL (ref 6–24)
CO2: 23 mmol/L (ref 20–29)
Calcium: 9.4 mg/dL (ref 8.7–10.2)
Chloride: 106 mmol/L (ref 96–106)
Creatinine, Ser: 2.14 mg/dL — ABNORMAL HIGH (ref 0.76–1.27)
Glucose: 81 mg/dL (ref 70–99)
Potassium: 4 mmol/L (ref 3.5–5.2)
Sodium: 143 mmol/L (ref 134–144)
eGFR: 38 mL/min/{1.73_m2} — ABNORMAL LOW (ref >59)

## 2021-12-09 NOTE — Telephone Encounter (Signed)
A Prior Authorization was initiated for this patients TRULICITY through CoverMyMeds.  ? ?Key: [id number] ? ?___________ ? ?Prior Auth for patients medication TRULICITY approved by BCBS from 12/09/21 to 12/08/22. ? ?Key: [id number] ? ?Patients pharmacy notified. ? ? ?

## 2021-12-10 LAB — URINE CULTURE

## 2021-12-13 ENCOUNTER — Other Ambulatory Visit (HOSPITAL_COMMUNITY): Payer: Self-pay

## 2022-01-10 ENCOUNTER — Other Ambulatory Visit: Payer: Self-pay | Admitting: Family Medicine

## 2022-01-10 DIAGNOSIS — Z940 Kidney transplant status: Secondary | ICD-10-CM

## 2022-02-25 ENCOUNTER — Other Ambulatory Visit: Payer: Self-pay | Admitting: Family Medicine

## 2022-02-25 DIAGNOSIS — E139 Other specified diabetes mellitus without complications: Secondary | ICD-10-CM

## 2022-03-16 DIAGNOSIS — I129 Hypertensive chronic kidney disease with stage 1 through stage 4 chronic kidney disease, or unspecified chronic kidney disease: Secondary | ICD-10-CM | POA: Diagnosis not present

## 2022-03-16 DIAGNOSIS — E785 Hyperlipidemia, unspecified: Secondary | ICD-10-CM | POA: Diagnosis not present

## 2022-03-16 DIAGNOSIS — E1129 Type 2 diabetes mellitus with other diabetic kidney complication: Secondary | ICD-10-CM | POA: Diagnosis not present

## 2022-03-16 DIAGNOSIS — Z940 Kidney transplant status: Secondary | ICD-10-CM | POA: Diagnosis not present

## 2022-03-17 ENCOUNTER — Telehealth: Payer: Self-pay | Admitting: *Deleted

## 2022-03-17 NOTE — Chronic Care Management (AMB) (Signed)
  Care Coordination  Note  03/17/2022 Name: KACEE KOREN MRN: [id number] DOB: [birth year]  Gwyneth Sprout is a 46 y.o. year old male who is a primary care patient of Sabino Dick, DO. I reached out to Gwyneth Sprout by phone today to offer care coordination services.      Mr. Sahli was given information about Care Coordination services today including:  The Care Coordination services include support from the care team which includes your Nurse Coordinator, Clinical Social Worker, or Pharmacist.  The Care Coordination team is here to help remove barriers to the health concerns and goals most important to you. Care Coordination services are voluntary and the patient may decline or stop services at any time by request to their care team member.   Patient agreed to services and verbal consent obtained.   Follow up plan: Telephone appointment with care coordination team member scheduled for:03/21/22  [facility] Coordination Care Guide  Direct Dial: [phone]

## 2022-03-21 ENCOUNTER — Other Ambulatory Visit: Payer: Self-pay | Admitting: Family Medicine

## 2022-03-21 ENCOUNTER — Ambulatory Visit: Payer: Self-pay

## 2022-03-21 DIAGNOSIS — Z139 Encounter for screening, unspecified: Secondary | ICD-10-CM

## 2022-03-21 MED ORDER — BLOOD GLUCOSE MONITOR KIT
PACK | 0 refills | Status: DC
Start: 2022-03-21 — End: 2023-07-31

## 2022-03-21 NOTE — Patient Instructions (Signed)
Visit Information  Thank you for taking time to visit with me today. Please don't hesitate to contact me if I can be of assistance to you.   Following are the goals we discussed today:   Goals Addressed               This Visit's Progress     " I dont check my blood sugars" (pt-stated)        Care Coordination Interventions: Provided education to patient about basic DM disease process Reviewed medications with patient and discussed importance of medication adherence Counseled on importance of regular laboratory monitoring as prescribed Provided patient with written educational materials related to hypo and hyperglycemia and importance of correct treatment findings outside established parameters Talked with the patient about receiving a glucometer and sent PCP a message to send in a prescription and also to get an appointment for care gaps           Our next appointment is by telephone on 04/06/22 at 4 PM  Please call the care guide team at [phone] if you need to cancel or reschedule your appointment.   IThe patient verbalized understanding of instructions, educational materials, and care plan provided today     Juanell Fairly RN, BSN, [facility] Care Coordinator [facility]   Phone: [phone]   .

## 2022-03-21 NOTE — Patient Outreach (Signed)
  Care Coordination   Initial Visit Note   03/21/2022 Name: Adam Davis MRN: [id number] DOB: 1976/08/12  Adam Davis is a 46 y.o. year old male who sees Sabino Dick, DO for primary care. I spoke with  Adam Davis by phone today  What matters to the patients health and wellness today?  I don't check my blood sugars.   Goals Addressed               This Visit's Progress     " I dont check my blood sugars" (pt-stated)        Care Coordination Interventions: Provided education to patient about basic DM disease process Reviewed medications with patient and discussed importance of medication adherence Counseled on importance of regular laboratory monitoring as prescribed Provided patient with written educational materials related to hypo and hyperglycemia and importance of correct treatment findings outside established parameters Talked with the patient about receiving a glucometer and sent PCP a message to send in a prescription and also to get an appointment for care gaps           SDOH assessments and interventions completed:   Yes SDOH Interventions Today    Flowsheet Row Most Recent Value  SDOH Interventions   Food Insecurity Interventions Intervention Not Indicated  Housing Interventions --  [Send to the caare guides]  Transportation Interventions Intervention Not Indicated       Care Coordination Interventions Activated:  Yes Care Coordination Interventions:  Yes, provided  Follow up plan: Follow up call scheduled for 04/06/22 @ 4 PM  Encounter Outcome:  Pt. Request to Call Back

## 2022-03-22 ENCOUNTER — Telehealth: Payer: Self-pay

## 2022-03-22 NOTE — Addendum Note (Signed)
Addended by: Phineas Semen on: 03/22/2022 08:17 AM   Modules accepted: Orders

## 2022-03-22 NOTE — Telephone Encounter (Signed)
   Telephone encounter was:  Unsuccessful.  03/22/2022 Name: Adam Davis MRN: [id number] DOB: 1976/06/18  Unsuccessful outbound call made today to assist with:   housing  Outreach Attempt:  1st Attempt  A HIPAA compliant voice message was left requesting a return call.  Instructed patient to call back at earliest convenience.   Lenard Forth Care Guide, Embedded Care Coordination [facility], Care Management  [phone] [address] [place], [place], [place] [address] Phone: [phone] Email: [email or web address]

## 2022-03-23 ENCOUNTER — Telehealth: Payer: Self-pay

## 2022-03-23 NOTE — Patient Outreach (Signed)
   RN Case Manager Care Management   Phone Outreach    03/23/2022 Name: [name] MRN: [id number] DOB: May 15, 1976  Adam Davis is a 46 y.o. year old male who is a primary care patient of Sharion Settler, DO .   Dr. Nita Sells requested that RN CM contact the patient to assist him in scheduling an appointment for his a1c check he had his their glucometer kit had been called in. The patient's appointment is scheduled for April 18, 2022 at 4:10 pm to accommodate his job.  Follow Up Plan:  No follow up required.    Review of patient status, including review of consultants reports, relevant laboratory and other test results, and collaboration with appropriate care team members and the patient's provider was performed as part of comprehensive patient evaluation and provision of care management services.    Lazaro Arms RN, BSN, [facility] Care Management Coordinator [place] Phone: [phone] Fax: [phone]

## 2022-03-23 NOTE — Telephone Encounter (Signed)
   Telephone encounter was:  Unsuccessful.  03/23/2022 Name: Adam Davis MRN: [id number] DOB: 05-25-1976  Unsuccessful outbound call made today to assist with:   housing  Outreach Attempt:  2nd Attempt  A HIPAA compliant voice message was left requesting a return call.  Instructed patient to call back at earliest convenience.  Lenard Forth Care Guide, Embedded Care Coordination [facility], Care Management  [phone] [address] [place], [place], [place] [address] Phone: [phone] Email: [email or web address]

## 2022-03-27 ENCOUNTER — Telehealth: Payer: Self-pay

## 2022-03-27 NOTE — Telephone Encounter (Signed)
   Telephone encounter was:  Successful.  03/27/2022 Name: Adam Davis MRN: [id number] DOB: 01-31-1976  Adam Davis is a 46 y.o. year old male who is a primary care patient of Sabino Dick, DO . The community resource team was consulted for assistance with  HOUSING  Care guide performed the following interventions: Patient provided with information about care guide support team and interviewed to confirm resource needs.Patient stated he is trying to find his own place, he is living with a roommate. Patient requested I mail and email the resources to him.  Follow Up Plan:  No further follow up planned at this time. The patient has been provided with needed resources.    Lenard Forth Care Guide, Embedded Care Coordination [facility], Care Management  [phone] [address] [place], [place], [place] [address] Phone: [phone] Email: [email or web address]

## 2022-04-06 ENCOUNTER — Ambulatory Visit: Payer: Self-pay

## 2022-04-06 NOTE — Patient Outreach (Signed)
  Care Coordination   04/06/2022 Name: Adam Davis MRN: [id number] DOB: 1976-01-11   Care Coordination Outreach Attempts:  An unsuccessful telephone outreach was attempted today to offer the patient information about available care coordination services as a benefit of their health plan.   Follow Up Plan:  Additional outreach attempts will be made to offer the patient care coordination information and services.   Encounter Outcome:  No Answer  Care Coordination Interventions Activated:  No   Care Coordination Interventions:  No, not indicated    Juanell Fairly RN, BSN, [facility] Care Coordinator [facility]   Phone: [phone]

## 2022-04-12 ENCOUNTER — Other Ambulatory Visit: Payer: Self-pay | Admitting: Family Medicine

## 2022-04-12 DIAGNOSIS — E139 Other specified diabetes mellitus without complications: Secondary | ICD-10-CM

## 2022-04-17 ENCOUNTER — Other Ambulatory Visit: Payer: Self-pay | Admitting: Family Medicine

## 2022-04-17 DIAGNOSIS — E139 Other specified diabetes mellitus without complications: Secondary | ICD-10-CM

## 2022-04-17 NOTE — Progress Notes (Signed)
    SUBJECTIVE:   CHIEF COMPLAINT / HPI:   Diabetes, Type 2 - Last A1c: Last A1c was 9 in April  - Medications: Semglee 40 units daily, Farxiga 10 mg daily, Trulicity 0.75 mg weekly - Compliance: Good - Checking BG at home: Only checks when he feels like he is low. Ranges 70-90s during this time. Sometimes feels this way after the gym.  - Diet: Cut out some bread. Drinking plenty of water.  - Exercise: Goes to [employer] three times a week for 2-3 hours.  - Eye exam: Due - Foot exam: Due - Microalbumin: Due - Statin: Rosuvastatin 20 mg daily (admits to not taking often) - Endorses some polyuria. No polydipsia. Endorses dry mouth on occasion, jittery when sugars are low.  Erectile Dysfunction Rx Viagra in the past. This did not agree with him- states it made his heart race. Still wakes up with erections sometimes. Would like to discuss with Urology.   PERTINENT  PMH / PSH:  Hx of left renal transplant (around age 21)   OBJECTIVE:   BP 130/78   Pulse 89   Ht 5\' 6"  (1.676 m)   Wt 163 lb 12.8 oz (74.3 kg)   SpO2 97%   BMI 26.44 kg/m    General: NAD, pleasant, able to participate in exam Respiratory: Normal respiratory effort Foot exam: Onychomycosis of right toes- nails are yellow and thickened.  Sensation intact to monofilament and light touch.  PT and DP pulses intact BL.   Skin: warm and dry, no rashes noted Psych: Normal affect and mood  ASSESSMENT/PLAN:   Type 2 diabetes mellitus with hyperglycemia (HCC) A1c today improved to 7.7 from 9 in April.  Congratulated him on improvement.  He appears to be adherent to medications but suboptimally checking sugars- hard to establish range of sugars. Does not appear that his hypoglycemic symptoms occur often. Appears motivated to make lifestyle changes. -Encouraged continued dietary modifications and exercise -Continue current regimen -Consider increasing Trulicity at next visit if still >7; may start to be able to wean  insulin if improved -Encouraged him to take his statin (prescribed 3 times weekly given his history of myalgias) -Referral to ophthalmology for eye exam  Essential hypertension, benign Right at goal.  -Continue medications.  Onychomycosis of toenail Affecting right side toes. Bothersome to patient. Completed course of terbinafine without any improvement. -Referral to podiatry  Erectile dysfunction Did not tolerate Viagra in the past, caused heart palpitations. -Referral to urology placed  Healthcare maintenance Referral to GI for screening colonoscopy.    May, DO [facility]

## 2022-04-18 ENCOUNTER — Ambulatory Visit (INDEPENDENT_AMBULATORY_CARE_PROVIDER_SITE_OTHER): Payer: BC Managed Care – PPO | Admitting: Family Medicine

## 2022-04-18 VITALS — BP 130/78 | HR 89 | Ht 66.0 in | Wt 163.8 lb

## 2022-04-18 DIAGNOSIS — Z1211 Encounter for screening for malignant neoplasm of colon: Secondary | ICD-10-CM

## 2022-04-18 DIAGNOSIS — E1165 Type 2 diabetes mellitus with hyperglycemia: Secondary | ICD-10-CM | POA: Diagnosis not present

## 2022-04-18 DIAGNOSIS — N529 Male erectile dysfunction, unspecified: Secondary | ICD-10-CM | POA: Insufficient documentation

## 2022-04-18 DIAGNOSIS — Z0000 Encounter for general adult medical examination without abnormal findings: Secondary | ICD-10-CM | POA: Insufficient documentation

## 2022-04-18 DIAGNOSIS — Z794 Long term (current) use of insulin: Secondary | ICD-10-CM | POA: Diagnosis not present

## 2022-04-18 DIAGNOSIS — B351 Tinea unguium: Secondary | ICD-10-CM

## 2022-04-18 DIAGNOSIS — I10 Essential (primary) hypertension: Secondary | ICD-10-CM

## 2022-04-18 LAB — POCT GLYCOSYLATED HEMOGLOBIN (HGB A1C): HbA1c, POC (controlled diabetic range): 7.7 % — AB (ref 0.0–7.0)

## 2022-04-18 NOTE — Assessment & Plan Note (Signed)
Referral to GI for screening colonoscopy 

## 2022-04-18 NOTE — Assessment & Plan Note (Signed)
Affecting right side toes. Bothersome to patient. Completed course of terbinafine without any improvement. -Referral to podiatry

## 2022-04-18 NOTE — Patient Instructions (Addendum)
It was wonderful to see you today.  Please bring ALL of your medications with you to every visit.   Today we talked about:  -Your hemoglobin A1c was 7.7 today which is an improvement from the last time!  -I am sending a couple referrals today: to Ophthalmology (for a diabetic eye exam), Urology and Podiatry (foot doctor) to discuss some of your concerns -Take your cholesterol medication three times a week (Monday, Wednesday, Friday) -Continue to work on diet changes -Come back in 3 months!   Diet Recommendations for Diabetes  Carbohydrate includes starch, sugar, and fiber.  Of these, only sugar and starch raise blood glucose.  (Fiber is found in fruits, vegetables [especially skin, seeds, and stalks], whole grains, and beans.)   Starchy (carb) foods: Bread, rice, pasta, potatoes, corn, cereal, grits, crackers, bagels, muffins, all baked goods.  (Fruit, milk, and yogurt also have carbohydrate, but most of these foods will not spike your blood sugar as most starchy or sweet foods will.)  A few fruits do cause high blood sugars; use small portions of bananas (limit to 1/2 at a time), grapes, watermelon, and oranges.   Protein foods: Meat, fish, poultry, eggs, dairy foods, and beans such as pinto and kidney beans (beans also provide carbohydrate).   1. Eat at least 3 REAL meals and 1-3 snacks per day.  Eat breakfast within one hour of getting up.  Have something to eat at least every 5 hours while awake.  - A REAL meal for lunch or dinner includes at least some protein, some starch, and vegetables and/or fruit.   - A REAL breakfast needs to include both starch and protein foods.     2. Limit starchy foods to TWO portions per meal and ONE per snack. ONE portion of a starchy food is equal to the following:  - ONE slice of bread (or its equivalent, such as half of a hamburger bun).  - 1/2 cup of a "scoopable" starchy food such as potatoes or rice.  - 15 grams of Total Carbohydrate as shown on food  label.  - 4 ounces of a sweet drink (including fruit juice).  3. Include twice the volume of vegetables as protein or carbohydrate foods for BOTH lunch and dinner  - Fresh or frozen vegetables are best.  - Keep frozen vegetables on hand for a quick option.          Thank you for choosing [facility].   Please call [phone] with any questions about today's appointment.  Please be sure to schedule follow up at the front  desk before you leave today.   Sabino Dick, DO PGY-3 Family Medicine

## 2022-04-18 NOTE — Assessment & Plan Note (Signed)
Did not tolerate Viagra in the past, caused heart palpitations. -Referral to urology placed

## 2022-04-18 NOTE — Assessment & Plan Note (Signed)
Right at goal.  -Continue medications.

## 2022-04-18 NOTE — Assessment & Plan Note (Addendum)
A1c today improved to 7.7 from 9 in April.  Congratulated him on improvement.  He appears to be adherent to medications but suboptimally checking sugars- hard to establish range of sugars. Does not appear that his hypoglycemic symptoms occur often. Appears motivated to make lifestyle changes. -Encouraged continued dietary modifications and exercise -Continue current regimen -Consider increasing Trulicity at next visit if still >7; may start to be able to wean insulin if improved -Encouraged him to take his statin (prescribed 3 times weekly given his history of myalgias) -Referral to ophthalmology for eye exam

## 2022-04-24 ENCOUNTER — Other Ambulatory Visit: Payer: Self-pay | Admitting: Family Medicine

## 2022-04-24 DIAGNOSIS — E139 Other specified diabetes mellitus without complications: Secondary | ICD-10-CM

## 2022-04-26 ENCOUNTER — Encounter (HOSPITAL_COMMUNITY): Payer: Self-pay

## 2022-04-26 ENCOUNTER — Ambulatory Visit (HOSPITAL_COMMUNITY)
Admission: EM | Admit: 2022-04-26 | Discharge: 2022-04-26 | Disposition: A | Discharge status: Home / Self Care | Payer: BC Managed Care – PPO | Attending: Physician Assistant | Admitting: Physician Assistant

## 2022-04-26 DIAGNOSIS — R059 Cough, unspecified: Secondary | ICD-10-CM | POA: Diagnosis not present

## 2022-04-26 DIAGNOSIS — Z20822 Contact with and (suspected) exposure to covid-19: Secondary | ICD-10-CM | POA: Diagnosis not present

## 2022-04-26 DIAGNOSIS — J069 Acute upper respiratory infection, unspecified: Secondary | ICD-10-CM | POA: Insufficient documentation

## 2022-04-26 DIAGNOSIS — R0981 Nasal congestion: Secondary | ICD-10-CM | POA: Diagnosis not present

## 2022-04-26 NOTE — ED Provider Notes (Addendum)
[place]    CSN: [id number] Arrival date & time: 04/26/22  1530      History   Chief Complaint Chief Complaint  Patient presents with   Nasal Congestion   Cough    HPI Adam Davis is a 46 y.o. male.   Patient here today for evaluation of nasal congestion, runny nose, cough and back pain he has had the last 3 days. He reports the first day of illness he did have subjective fever and sore throat but this has resolved. He has been taking tylenol with minimal relief. He denies any nausea, vomiting or diarrhea. He denies ear pain.   The history is provided by the patient.    Past Medical History:  Diagnosis Date   Diabetes mellitus    H/O acute post-streptococcal glomerulonephritis 1997   History of renal transplant    Hyperlipidemia    Hypertension    Onychomycosis of toenail    Renal disorder    Tachycardia     Patient Active Problem List   Diagnosis Date Noted   Erectile dysfunction 04/18/2022   Healthcare maintenance 04/18/2022   Low back pain without sciatica 12/08/2021   Type 2 diabetes mellitus with hyperglycemia ([place]) 02/09/2021   AKI (acute kidney injury) ([place]) 02/09/2021   Uncontrolled type 2 diabetes mellitus without complication, with long-term current use of insulin 10/29/2018   Essential hypertension, benign 10/30/2012   HLD (hyperlipidemia) 08/08/2012   Onychomycosis of toenail 07/04/2012   Diabetes mellitus following renal transplant ([address]) 05/22/2012   History of renal transplant 05/22/2012    Past Surgical History:  Procedure Laterality Date   [place]   Removed after transplant in 2006   KIDNEY SURGERY  2006   [place] Medications    Prior to Admission medications   Medication Sig Start Date End Date Taking? Authorizing Provider  Truddie Crumble ULTRA-THIN LANCETS MISC Check sugars three times a day 10/22/18   Martyn Malay, MD  amLODipine (NORVASC) 10 MG tablet  Take 10 mg by mouth daily.    [provider]  Bayer Microlet Lancets lancets Use to check blood sugars 3 times a day. Patient not taking: Reported on 03/21/2022 12/13/18   Guadalupe Dawn, MD  blood glucose meter kit and supplies KIT Dispense based on patient and insurance preference. Use up to four times daily as directed. 03/21/22   Sharion Settler, DO  Blood Glucose Monitoring Suppl (ACCU-CHEK AVIVA PLUS) w/Device KIT 1 each by Does not apply route 4 (four) times daily -  before meals and at bedtime. Patient not taking: Reported on 03/21/2022 09/19/19   Guadalupe Dawn, MD  Dulaglutide (TRULICITY) 2.97 LG/9.2JJ SOPN Inject 0.75 mg into the skin once a week. 12/08/21   Cresenzo, Angelyn Punt, MD  FARXIGA 10 MG TABS tablet Take 10 mg by mouth daily. 03/08/21   [provider]  glucose blood test strip Use to check blood sugars 3 times a day. Patient not taking: Reported on 03/21/2022 12/13/18   Guadalupe Dawn, MD  insulin glargine-yfgn (SEMGLEE, YFGN,) 100 UNIT/ML Pen Inject 40 Units into the skin daily. 04/25/22   Sharion Settler, DO  Lancet Devices (MICROLET NEXT LANCING DEVICE) MISC 1 each by Does not apply route 3 (three) times daily. Patient not taking: Reported on 03/21/2022 12/13/18   Guadalupe Dawn, MD  multivitamin (ONE-A-DAY MEN'S) TABS tablet Take 1 tablet daily by mouth.  [provider]  mycophenolate (CELLCEPT) 250 MG capsule Take 500 mg by mouth 2 (two) times daily.     [provider]  rosuvastatin (CRESTOR) 20 MG tablet Take 1 tablet (20 mg total) by mouth every Monday, Wednesday, and Friday. Please schedule an appointment for additional refills. Patient not taking: Reported on 04/18/2022 11/02/21   Sharion Settler, DO  sildenafil (VIAGRA) 100 MG tablet Take 100 mg by mouth daily as needed. Patient not taking: Reported on 04/18/2022 03/23/21   [provider]  tacrolimus (PROGRAF) 1 MG capsule Take 1 capsule (1 mg total) by mouth 2 (two)  times daily. 10/08/13   Leone Haven, MD  terbinafine (LAMISIL) 250 MG tablet Take 250 mg by mouth daily. Patient not taking: Reported on 03/21/2022    [provider]  Whey Protein POWD Take 1 Scoop See admin instructions by mouth. 1 scoop dissolved in 4 ounces of water once a day (DRINK) Patient not taking: Reported on 03/21/2022    [provider]  atorvastatin (LIPITOR) 40 MG tablet Take 1 tablet (40 mg total) by mouth daily. Patient not taking: Reported on 07/09/2017 07/13/16 05/09/19  Smiley Houseman, MD  metoprolol succinate (TOPROL-XL) 50 MG 24 hr tablet Take 50 mg by mouth daily. Take with or immediately following a meal.  05/09/19  [provider]    Family History Family History  Problem Relation Age of Onset   Diabetes Brother    Diabetes Maternal Grandmother    Hypertension Maternal Grandmother     Social History Social History   Tobacco Use   Smoking status: Never   Smokeless tobacco: Never  Substance Use Topics   Alcohol use: No   Drug use: No     Allergies   Atorvastatin   Review of Systems Review of Systems  Constitutional:  Positive for fever (resolved). Negative for chills.  HENT:  Positive for congestion, rhinorrhea and sore throat (resolved). Negative for ear pain.   Eyes:  Negative for discharge and redness.  Respiratory:  Positive for cough. Negative for shortness of breath.   Gastrointestinal:  Negative for abdominal pain, diarrhea, nausea and vomiting.  Musculoskeletal:  Positive for back pain.     Physical Exam Triage Vital Signs ED Triage Vitals  Enc Vitals Group     BP      Pulse      Resp      Temp      Temp src      SpO2      Weight      Height      Head Circumference      Peak Flow      Pain Score      Pain Loc      Pain Edu?      Excl. in [place]?    No data found.  Updated Vital Signs BP (!) 157/98 (BP Location: Right Arm)   Pulse (!) 102   Temp 98 F (36.7 C) (Oral)   Resp 12   Ht 5'  6" (1.676 m)   Wt 165 lb (74.8 kg)   SpO2 98%   BMI 26.63 kg/m      Physical Exam Vitals and nursing note reviewed.  Constitutional:      General: He is not in acute distress.    Appearance: Normal appearance. He is not ill-appearing.  HENT:     Head: Normocephalic and atraumatic.     Nose: Congestion (mild) present.     Mouth/Throat:  Mouth: Mucous membranes are moist.     Pharynx: Oropharynx is clear. No oropharyngeal exudate or posterior oropharyngeal erythema.  Eyes:     Conjunctiva/sclera: Conjunctivae normal.  Cardiovascular:     Rate and Rhythm: Normal rate and regular rhythm.     Heart sounds: Normal heart sounds. No murmur heard. Pulmonary:     Effort: Pulmonary effort is normal. No respiratory distress.     Breath sounds: Normal breath sounds. No wheezing, rhonchi or rales.  Skin:    General: Skin is warm and dry.  Neurological:     Mental Status: He is alert.  Psychiatric:        Mood and Affect: Mood normal.        Thought Content: Thought content normal.      UC Treatments / Results  Labs (all labs ordered are listed, but only abnormal results are displayed) Labs Reviewed  SARS CORONAVIRUS 2 (TAT 6-24 HRS)    EKG   Radiology No results found.  Procedures Procedures (including critical care time)  Medications Ordered in UC Medications - No data to display  Initial Impression / Assessment and Plan / UC Course  I have reviewed the triage vital signs and the nursing notes.  Pertinent labs & imaging results that were available during my care of the patient were reviewed by me and considered in my medical decision making (see chart for details).    Suspect viral etiology of symptoms, will screen for covid. Recommend continued symptomatic treatment, increased fluids and rest. Encouraged follow up if no gradual improvement or if symptoms worsen in any way.   Most recent GFR from 11/2021 was 38-- patient could possibly take paxlovid at lower  dosing if desired, however given mild symptoms at this time that do seem to be improving, and GFR in lower range  would likely defer antiviral treatment.   Final Clinical Impressions(s) / UC Diagnoses   Final diagnoses:  Viral upper respiratory tract infection     Discharge Instructions       Please check MyChart for Covid screening results.   Follow up if symptoms do no continue to improve with time or worsen.      ED Prescriptions   None    PDMP not reviewed this encounter.   Francene Finders, PA-C 04/26/22 1707    Francene Finders, PA-C 04/26/22 [phone]

## 2022-04-26 NOTE — ED Triage Notes (Signed)
Pt is here for nasal congestion, runny nose , cough and back pain x3 days. Pt denies nausea, vomiting and fever

## 2022-04-26 NOTE — Discharge Instructions (Signed)
  Please check MyChart for Covid screening results.   Follow up if symptoms do no continue to improve with time or worsen.

## 2022-04-27 LAB — SARS CORONAVIRUS 2 (TAT 6-24 HRS): SARS Coronavirus 2: NEGATIVE

## 2022-05-03 DIAGNOSIS — E1129 Type 2 diabetes mellitus with other diabetic kidney complication: Secondary | ICD-10-CM | POA: Diagnosis not present

## 2022-05-03 DIAGNOSIS — E785 Hyperlipidemia, unspecified: Secondary | ICD-10-CM | POA: Diagnosis not present

## 2022-05-03 DIAGNOSIS — Z940 Kidney transplant status: Secondary | ICD-10-CM | POA: Diagnosis not present

## 2022-05-03 DIAGNOSIS — I129 Hypertensive chronic kidney disease with stage 1 through stage 4 chronic kidney disease, or unspecified chronic kidney disease: Secondary | ICD-10-CM | POA: Diagnosis not present

## 2022-05-16 ENCOUNTER — Ambulatory Visit: Payer: BC Managed Care – PPO | Admitting: Podiatry

## 2022-05-19 ENCOUNTER — Other Ambulatory Visit: Payer: Self-pay | Admitting: Family Medicine

## 2022-05-19 DIAGNOSIS — E139 Other specified diabetes mellitus without complications: Secondary | ICD-10-CM

## 2022-06-24 ENCOUNTER — Other Ambulatory Visit: Payer: Self-pay | Admitting: Family Medicine

## 2022-06-24 DIAGNOSIS — Z940 Kidney transplant status: Secondary | ICD-10-CM

## 2022-07-28 ENCOUNTER — Other Ambulatory Visit: Payer: Self-pay | Admitting: Family Medicine

## 2022-07-28 DIAGNOSIS — E139 Other specified diabetes mellitus without complications: Secondary | ICD-10-CM

## 2022-08-08 DIAGNOSIS — I129 Hypertensive chronic kidney disease with stage 1 through stage 4 chronic kidney disease, or unspecified chronic kidney disease: Secondary | ICD-10-CM | POA: Diagnosis not present

## 2022-08-08 DIAGNOSIS — Z940 Kidney transplant status: Secondary | ICD-10-CM | POA: Diagnosis not present

## 2022-08-08 DIAGNOSIS — R799 Abnormal finding of blood chemistry, unspecified: Secondary | ICD-10-CM | POA: Diagnosis not present

## 2022-08-08 DIAGNOSIS — E785 Hyperlipidemia, unspecified: Secondary | ICD-10-CM | POA: Diagnosis not present

## 2022-08-08 DIAGNOSIS — E1129 Type 2 diabetes mellitus with other diabetic kidney complication: Secondary | ICD-10-CM | POA: Diagnosis not present

## 2022-08-31 DIAGNOSIS — Z940 Kidney transplant status: Secondary | ICD-10-CM | POA: Diagnosis not present

## 2022-09-01 ENCOUNTER — Other Ambulatory Visit (HOSPITAL_COMMUNITY): Payer: Self-pay

## 2022-09-08 ENCOUNTER — Telehealth: Payer: Self-pay | Admitting: Family Medicine

## 2022-09-08 ENCOUNTER — Other Ambulatory Visit: Payer: Self-pay | Admitting: Family Medicine

## 2022-09-08 DIAGNOSIS — Z940 Kidney transplant status: Secondary | ICD-10-CM

## 2022-09-08 NOTE — Telephone Encounter (Signed)
Received insulin refill request. Called patient twice to try to schedule an appointment as he is overdue for diabetes follow up (have advised him to call to schedule appointment on prior refills). Went directly to voicemail both times. Was able to get in touch with his spouse who is listed as a contact. She reports he is unable to have phone with him at work. She reports that he just picked up his Semglee yesterday and has not run out. Discussed that he is overdue for an appointment, she will advise him of this. I did schedule an appointment for him on 1/16 at 4:10. She will have him reschedule if this does not work for him.

## 2022-09-12 ENCOUNTER — Encounter: Payer: Self-pay | Admitting: Family Medicine

## 2022-09-12 ENCOUNTER — Ambulatory Visit (INDEPENDENT_AMBULATORY_CARE_PROVIDER_SITE_OTHER): Payer: BC Managed Care – PPO | Admitting: Family Medicine

## 2022-09-12 ENCOUNTER — Other Ambulatory Visit: Payer: Self-pay | Admitting: Family Medicine

## 2022-09-12 VITALS — BP 138/88 | HR 88 | Wt 166.6 lb

## 2022-09-12 DIAGNOSIS — Z794 Long term (current) use of insulin: Secondary | ICD-10-CM

## 2022-09-12 DIAGNOSIS — N184 Chronic kidney disease, stage 4 (severe): Secondary | ICD-10-CM

## 2022-09-12 DIAGNOSIS — E785 Hyperlipidemia, unspecified: Secondary | ICD-10-CM

## 2022-09-12 DIAGNOSIS — E1165 Type 2 diabetes mellitus with hyperglycemia: Secondary | ICD-10-CM

## 2022-09-12 LAB — POCT GLYCOSYLATED HEMOGLOBIN (HGB A1C): HbA1c, POC (controlled diabetic range): 8.1 % — AB (ref 0.0–7.0)

## 2022-09-12 MED ORDER — PEN NEEDLES 32G X 4 MM MISC: 1.0000 | Freq: Every day | 2 refills | Status: DC

## 2022-09-12 NOTE — Patient Instructions (Addendum)
It was wonderful to see you today.  Please bring ALL of your medications with you to every visit.   Today we talked about:  Your A1c was slightly worse today. I would recommend switching your insulin to the morning. Keep a log of your sugars.  Return in a month to check in with our Pharmacist team.   I have sent a referral to the ophthalmologist for a diabetic eye examination.   Medicaid is expanding as of July 28, 2022.  To apply: Go online to [email or web address]  OR  Call your local Department of Social Services(DSS) and complete a telephone application. Phone: [phone] OR  Venida Jarvis out a paper application and either mail, fax, email or drop off the application to your local DSS office. Addresses are below  OR  Apply in person at your local Department of Social Services (DSS) office. Addresses are below   Where to [employer] your Application:   [address] [place], [place] [address]  [facility] [address] [place], [place] [address] ?  You should be eligible for Medicaid if:   You live in [place] are ages 44 through 21 You are a citizen (some non-US citizens can also get health care coverage through [facility]). And if your household income falls within the chart below:   sehold size Total income, before taxes  Single Adults $1,676/month or less     ($20,120/year)  Family of 2 $2,267/month or less      ($27,214/year)  Family of 3 $2,859/month or less  ($34,307/year)  Family of 4 $3,450/month or less  ($41,400/year)  Each additional person add $591/month (add $7,094/year)   Information you will need:  Full legal name Date of birth Social Security number (or immigration documents) [employer] information (from [place], W-2 forms, tax returns or business records)   Thank you for coming to your visit as scheduled. We have had a large "no-show" problem lately, and this significantly limits our ability to  see and care for patients. As a friendly reminder- if you cannot make your appointment please call to cancel. We do have a no show policy for those who do not cancel within 24 hours. Our policy is that if you miss or fail to cancel an appointment within 24 hours, 3 times in a 13-month period, you may be dismissed from our clinic.   Thank you for choosing [facility].   Please call [phone] with any questions about today's appointment.  Please be sure to schedule follow up at the front  desk before you leave today.   Sharion Settler, DO PGY-3 Family Medicine

## 2022-09-12 NOTE — Progress Notes (Signed)
SUBJECTIVE:   CHIEF COMPLAINT / HPI:   Adam Davis is a 47 y.o. male who presents to the [facility] clinic today to discuss the following concerns:   Diabetes, Type 2 - Last A1c was 7.7 in August  - Medications: Taking 45U. States he was advised to stop [place] given reduced renal function. He is going to go to [facility] for a renal biopsy. - Compliance: Good - Checking BG at home: once a day fasting- typically runs in the 70's - Diet: "it's been good" - Exercise: Exercises regularly. States he works out at [employer]. 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 Onychomycosis of bilateral toes. Sensation intact to monofilament and light touch.  PT and DP pulses intact BL.   Extremities: no edema. Chronic skin changes to b/l lower extremities  Psych: Normal affect and mood     09/12/2022    4:40 PM 04/18/2022    4:09 PM 12/08/2021   10:43 AM  Depression screen PHQ 2/9  Decreased Interest 0 0 0  Down, Depressed, Hopeless 0 0 0  PHQ - 2 Score 0 0 0  Altered sleeping 0 0 0  Tired, decreased energy 0 0 0  Change in appetite 0 0 0  Feeling bad or failure about yourself  0 0 0  Trouble concentrating 0 0 0  Moving slowly or fidgety/restless 0 0 0  Suicidal thoughts 0 0 0  PHQ-9 Score 0 0 0  Difficult doing work/chores Not difficult at all  Not difficult at all     ASSESSMENT/PLAN:   1. Type 2 diabetes mellitus with hyperglycemia, with long-term current use of  insulin (HCC) Hgb A1c 8.1, increased from 7.7 in August. Does endorse some symptomatic episodes of hypoglycemia. His fasting sugars seem to be appropriate.  -Recommend switching Semglee from night time administration to morning instead. Will not adjust amount at this time.  -Log sugars and f/u with Dr. Valentina Lucks in February. May be good candidate for CBM monitoring. Appreciate assistance with insulin titration if appropriate -His renal function limits other options for his diabetes control. He did not tolerate GLP-1 in the past - Repeat HgB A1c in 3 months - Ambulatory referral to Ophthalmology - Insulin Pen Needle (PEN NEEDLES) 32G X 4 MM MISC; 1 each by Does not apply route daily.  Dispense: 100 each; Refill: 2 -Diabetic foot exam today -Continue statin -Future order placed for BMP, lipid panel  -Canceling urine microalbumin given his known renal disease- he is following with Nephrology and states he will be getting a biopsy at [facility]   2. CKD (chronic kidney disease) stage 4, GFR 15-29 ml/min (HCC) Follows with Nephrology and states is in the process of getting scheduled for renal transplant at [facility]. 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 He is on immunosuppressive therapy.  -F/u with specialists as scheduled -BMP at f/u with Dr. Valentina Lucks  Sharion Settler, [place]

## 2022-10-02 ENCOUNTER — Other Ambulatory Visit: Payer: Self-pay | Admitting: Family Medicine

## 2022-10-02 DIAGNOSIS — Z940 Kidney transplant status: Secondary | ICD-10-CM

## 2022-10-05 ENCOUNTER — Ambulatory Visit: Payer: BC Managed Care – PPO | Admitting: Pharmacist

## 2022-10-05 ENCOUNTER — Other Ambulatory Visit: Payer: Self-pay

## 2022-10-26 ENCOUNTER — Other Ambulatory Visit: Payer: BC Managed Care – PPO

## 2022-10-26 ENCOUNTER — Ambulatory Visit: Payer: BC Managed Care – PPO | Admitting: Pharmacist

## 2022-10-31 DIAGNOSIS — I129 Hypertensive chronic kidney disease with stage 1 through stage 4 chronic kidney disease, or unspecified chronic kidney disease: Secondary | ICD-10-CM | POA: Diagnosis not present

## 2022-10-31 DIAGNOSIS — E785 Hyperlipidemia, unspecified: Secondary | ICD-10-CM | POA: Diagnosis not present

## 2022-10-31 DIAGNOSIS — Z940 Kidney transplant status: Secondary | ICD-10-CM | POA: Diagnosis not present

## 2022-10-31 DIAGNOSIS — E1129 Type 2 diabetes mellitus with other diabetic kidney complication: Secondary | ICD-10-CM | POA: Diagnosis not present

## 2022-10-31 DIAGNOSIS — R799 Abnormal finding of blood chemistry, unspecified: Secondary | ICD-10-CM | POA: Diagnosis not present

## 2022-11-07 ENCOUNTER — Other Ambulatory Visit: Payer: BC Managed Care – PPO

## 2022-11-07 ENCOUNTER — Ambulatory Visit: Payer: BC Managed Care – PPO | Admitting: Pharmacist

## 2022-11-07 DIAGNOSIS — E785 Hyperlipidemia, unspecified: Secondary | ICD-10-CM

## 2022-11-07 DIAGNOSIS — Z794 Long term (current) use of insulin: Secondary | ICD-10-CM

## 2022-11-07 DIAGNOSIS — E1165 Type 2 diabetes mellitus with hyperglycemia: Secondary | ICD-10-CM | POA: Diagnosis not present

## 2022-11-08 ENCOUNTER — Other Ambulatory Visit: Payer: Self-pay | Admitting: Family Medicine

## 2022-11-08 LAB — BASIC METABOLIC PANEL
BUN/Creatinine Ratio: 8 — ABNORMAL LOW (ref 9–20)
BUN: 36 mg/dL — ABNORMAL HIGH (ref 6–24)
CO2: 19 mmol/L — ABNORMAL LOW (ref 20–29)
Calcium: 8.2 mg/dL — ABNORMAL LOW (ref 8.7–10.2)
Chloride: 105 mmol/L (ref 96–106)
Creatinine, Ser: 4.26 mg/dL — ABNORMAL HIGH (ref 0.76–1.27)
Glucose: 75 mg/dL (ref 70–99)
Potassium: 3.9 mmol/L (ref 3.5–5.2)
Sodium: 140 mmol/L (ref 134–144)
eGFR: 16 mL/min/{1.73_m2} — ABNORMAL LOW (ref >59)

## 2022-11-08 LAB — LIPID PANEL
Chol/HDL Ratio: 4.6 ratio (ref 0.0–5.0)
Cholesterol, Total: 226 mg/dL — ABNORMAL HIGH (ref 100–199)
HDL: 49 mg/dL (ref >39)
LDL Chol Calc (NIH): 156 mg/dL — ABNORMAL HIGH (ref 0–99)
Triglycerides: 116 mg/dL (ref 0–149)
VLDL Cholesterol Cal: 21 mg/dL (ref 5–40)

## 2022-11-08 MED ORDER — EZETIMIBE 10 MG PO TABS: 10.0000 mg | ORAL_TABLET | Freq: Every day | ORAL | 3 refills | Status: DC

## 2022-11-09 ENCOUNTER — Other Ambulatory Visit: Payer: Self-pay | Admitting: Family Medicine

## 2022-11-09 DIAGNOSIS — E785 Hyperlipidemia, unspecified: Secondary | ICD-10-CM

## 2023-01-09 DIAGNOSIS — E785 Hyperlipidemia, unspecified: Secondary | ICD-10-CM | POA: Diagnosis not present

## 2023-01-09 DIAGNOSIS — Z940 Kidney transplant status: Secondary | ICD-10-CM | POA: Diagnosis not present

## 2023-01-09 DIAGNOSIS — I129 Hypertensive chronic kidney disease with stage 1 through stage 4 chronic kidney disease, or unspecified chronic kidney disease: Secondary | ICD-10-CM | POA: Diagnosis not present

## 2023-01-09 DIAGNOSIS — E1129 Type 2 diabetes mellitus with other diabetic kidney complication: Secondary | ICD-10-CM | POA: Diagnosis not present

## 2023-01-16 ENCOUNTER — Other Ambulatory Visit: Payer: Self-pay | Admitting: Family Medicine

## 2023-01-16 DIAGNOSIS — E785 Hyperlipidemia, unspecified: Secondary | ICD-10-CM

## 2023-01-29 ENCOUNTER — Other Ambulatory Visit: Payer: Self-pay | Admitting: Family Medicine

## 2023-01-29 ENCOUNTER — Other Ambulatory Visit: Payer: Self-pay

## 2023-01-29 DIAGNOSIS — Z940 Kidney transplant status: Secondary | ICD-10-CM

## 2023-01-29 MED ORDER — INSULIN GLARGINE-YFGN 100 UNIT/ML ~~LOC~~ SOPN: 45.0000 [IU] | PEN_INJECTOR | Freq: Every morning | SUBCUTANEOUS | 1 refills | Status: DC

## 2023-01-29 NOTE — Telephone Encounter (Signed)
Patient presents to clinic regarding insulin refill. He states that he has been out for the last several days.   Spoke with Dr. Deirdre Priest. He sent over the refill for the [facility].   Advised patient to schedule follow up for diabetes.   Veronda Prude, RN

## 2023-01-30 ENCOUNTER — Telehealth: Payer: Self-pay | Admitting: Family Medicine

## 2023-01-30 NOTE — Telephone Encounter (Signed)
Called and verified with pharmacy. Patient cannot pick up insulin until 6/13. He received 67 day supply on 4/16. Patient states that he has been taking as directed and that there has not been any issues with the pens.   Patient reports that he is completely out of insulin. Reports blood sugar levels have been around 160's.   Checked in med refrigerator for samples. Unable to find samples of long acting insulin.   Recommended that patient be seen as soon as possible to discuss further with provider. He may need dosage adjustment/alternative medication.   Patient is only able to come to a late afternoon appointment. Scheduled in ATC on Thursday.   Veronda Prude, RN

## 2023-01-30 NOTE — Telephone Encounter (Signed)
Patient came in stating that he went to pick up his insulin from the pharmacy yesterday, but was told that his insurance won't cover it until the 17th. He is completely out and wants to know if there is anything we can do to help

## 2023-02-01 ENCOUNTER — Ambulatory Visit (INDEPENDENT_AMBULATORY_CARE_PROVIDER_SITE_OTHER): Payer: BC Managed Care – PPO | Admitting: Family Medicine

## 2023-02-01 ENCOUNTER — Other Ambulatory Visit: Payer: Self-pay

## 2023-02-01 VITALS — BP 154/95 | HR 82 | Ht 66.0 in | Wt 161.0 lb

## 2023-02-01 DIAGNOSIS — E139 Other specified diabetes mellitus without complications: Secondary | ICD-10-CM

## 2023-02-01 DIAGNOSIS — E1165 Type 2 diabetes mellitus with hyperglycemia: Secondary | ICD-10-CM | POA: Diagnosis not present

## 2023-02-01 DIAGNOSIS — Z794 Long term (current) use of insulin: Secondary | ICD-10-CM

## 2023-02-01 DIAGNOSIS — Z940 Kidney transplant status: Secondary | ICD-10-CM

## 2023-02-01 DIAGNOSIS — I10 Essential (primary) hypertension: Secondary | ICD-10-CM | POA: Diagnosis not present

## 2023-02-01 LAB — POCT GLYCOSYLATED HEMOGLOBIN (HGB A1C): HbA1c, POC (controlled diabetic range): 7.6 % — AB (ref 0.0–7.0)

## 2023-02-01 MED ORDER — INSULIN GLARGINE-YFGN 100 UNIT/ML ~~LOC~~ SOPN: 46.0000 [IU] | PEN_INJECTOR | Freq: Every day | SUBCUTANEOUS | 3 refills | Status: DC

## 2023-02-01 NOTE — Assessment & Plan Note (Signed)
A1c 7.6. Currently on Semglee 45 units daily but has been out of insulin for 5 days. Reassuringly he is feeling well. Called [facility] and was told by pharmacy in order to get a refill the dose had to be different because insurance would not approve any more of current medication.  Sent in for 46 units daily. Return precautions discussed.

## 2023-02-01 NOTE — Progress Notes (Signed)
    SUBJECTIVE:   CHIEF COMPLAINT / HPI:   Patient presents for diabetes follow up. He states he has not had insulin since Saturday. States pharmacy told him blue cross blue shield wont cover his insulin until the 13th. Takes Semglee 45 units daily. Denies abdominal pain, nausea, vomiting. Does endorse polyuria. No polydypsia   HTN: BP elevated but states he is taking his medication. Takes Amlodipine 10mg   Feels that BP is elevated today because his anxiety about not having his insulin.   PERTINENT  PMH / PSH: Reviewed   OBJECTIVE:   BP (!) 154/95   Pulse 82   Ht 5\' 6"  (1.676 m)   Wt 161 lb (73 kg)   SpO2 98%   BMI 25.99 kg/m    Physical exam General: well appearing, NAD Cardiovascular: RRR, no murmurs Lungs: CTAB. Normal WOB Abdomen: soft, non-distended Skin: warm, dry. No edema  ASSESSMENT/PLAN:   Essential hypertension, benign BP 163/102  and on repeat 154/95. Did take Amlodipine 10mg  today. Feels his blood pressure is elevated due to his worry about not having his insulin. Limited by options given kidney function. Recommended returning in a couple of weeks for blood pressure follow up. Can consider adding Metoprolol if continues to be elevated.   Diabetes mellitus following renal transplant A1c 7.6. Currently on Semglee 45 units daily but has been out of insulin for 5 days. Reassuringly he is feeling well. Called [facility] pharmacy and was told by pharmacy in order to get a refill the dose had to be different because insurance would not approve any more of current medication.  Sent in for 46 units daily. Return precautions discussed.     Cora Collum, DO [facility]

## 2023-02-01 NOTE — Assessment & Plan Note (Signed)
BP 163/102  and on repeat 154/95. Did take Amlodipine 10mg  today. Feels his blood pressure is elevated due to his worry about not having his insulin. Limited by options given kidney function. Recommended returning in a couple of weeks for blood pressure follow up. Can consider adding Metoprolol if continues to be elevated.

## 2023-02-01 NOTE — Patient Instructions (Signed)
It was great seeing you today!  I have sent in your semglee to your pharmacy. Please start taking that today.  Return in the next couple of weeks to check on your blood pressure and possibly adjust medication.   Feel free to call with any questions or concerns at any time, at [phone].   Take care,  Dr. Cora Collum [facility] Center

## 2023-02-05 ENCOUNTER — Ambulatory Visit: Payer: BC Managed Care – PPO | Admitting: Family Medicine

## 2023-02-12 ENCOUNTER — Ambulatory Visit (INDEPENDENT_AMBULATORY_CARE_PROVIDER_SITE_OTHER): Payer: BC Managed Care – PPO | Admitting: Family Medicine

## 2023-02-12 VITALS — BP 141/88 | HR 97 | Ht 66.0 in | Wt 162.4 lb

## 2023-02-12 DIAGNOSIS — I10 Essential (primary) hypertension: Secondary | ICD-10-CM

## 2023-02-12 MED ORDER — METOPROLOL SUCCINATE ER 25 MG PO TB24: 25.0000 mg | ORAL_TABLET | Freq: Every day | ORAL | 3 refills | Status: DC

## 2023-02-12 NOTE — Progress Notes (Unsigned)
    SUBJECTIVE:   CHIEF COMPLAINT / HPI:   Patient presents for medication management   HTN: Takes amlodipine 10mg  daily Does not take blood pressure at home, has checked a couple times at the store but  Open to taking a combination pill    PERTINENT  PMH / PSH: ***  OBJECTIVE:   BP (!) 143/95   Pulse 97   Ht 5\' 6"  (1.676 m)   Wt 162 lb 6 oz (73.7 kg)   SpO2 99%   BMI 26.21 kg/m    Physical exam General: well appearing, NAD Cardiovascular: RRR, no murmurs Lungs: CTAB. Normal WOB Abdomen: soft, non-distended, non-tender Skin: warm, dry. No edema  ASSESSMENT/PLAN:   No problem-specific Assessment & Plan notes found for this encounter.   Due for urine ACR   Cora Collum, DO [facility] Health [facility]

## 2023-02-12 NOTE — Patient Instructions (Signed)
It was great seeing you today!  Continue taking amlodpine 10mg  daily and start taking Metoprolol 25mg  with it. We will check again at your follow up appointment and increase the dose if needed.    Visit Reminders: - Stop by the pharmacy to pick up your prescriptions  - Continue to work on your healthy eating habits and incorporating exercise into your daily life.   Feel free to call with any questions or concerns at any time, at [phone].   Take care,  Dr. Cora Collum [facility]

## 2023-02-13 NOTE — Assessment & Plan Note (Signed)
BP 141/88 on repeat check. Currently on Amlodipine 10mg  daily. Given his kidney disease we are limited in our options for additional blood pressure control.  Will start metoprolol 25 mg daily.  Follow-up scheduled in 2 weeks for recheck and possible dose adjustment. - urine ACR - Metoprolol succinate 25mg  daily

## 2023-02-14 LAB — MICROALBUMIN / CREATININE URINE RATIO
Creatinine, Urine: 153.9 mg/dL
Microalb/Creat Ratio: 2366 mg/g creat — ABNORMAL HIGH (ref 0–29)
Microalbumin, Urine: 3641.9 ug/mL

## 2023-02-22 ENCOUNTER — Ambulatory Visit: Payer: Self-pay | Admitting: Family Medicine

## 2023-03-13 DIAGNOSIS — E1129 Type 2 diabetes mellitus with other diabetic kidney complication: Secondary | ICD-10-CM | POA: Diagnosis not present

## 2023-03-13 DIAGNOSIS — I129 Hypertensive chronic kidney disease with stage 1 through stage 4 chronic kidney disease, or unspecified chronic kidney disease: Secondary | ICD-10-CM | POA: Diagnosis not present

## 2023-03-13 DIAGNOSIS — E785 Hyperlipidemia, unspecified: Secondary | ICD-10-CM | POA: Diagnosis not present

## 2023-03-13 DIAGNOSIS — Z940 Kidney transplant status: Secondary | ICD-10-CM | POA: Diagnosis not present

## 2023-05-15 DIAGNOSIS — Z940 Kidney transplant status: Secondary | ICD-10-CM | POA: Diagnosis not present

## 2023-05-15 DIAGNOSIS — E119 Type 2 diabetes mellitus without complications: Secondary | ICD-10-CM | POA: Diagnosis not present

## 2023-05-15 DIAGNOSIS — E1129 Type 2 diabetes mellitus with other diabetic kidney complication: Secondary | ICD-10-CM | POA: Diagnosis not present

## 2023-05-15 DIAGNOSIS — I129 Hypertensive chronic kidney disease with stage 1 through stage 4 chronic kidney disease, or unspecified chronic kidney disease: Secondary | ICD-10-CM | POA: Diagnosis not present

## 2023-05-15 DIAGNOSIS — E785 Hyperlipidemia, unspecified: Secondary | ICD-10-CM | POA: Diagnosis not present

## 2023-05-28 ENCOUNTER — Ambulatory Visit (INDEPENDENT_AMBULATORY_CARE_PROVIDER_SITE_OTHER): Payer: BC Managed Care – PPO

## 2023-05-28 ENCOUNTER — Encounter (HOSPITAL_COMMUNITY): Payer: Self-pay | Admitting: Emergency Medicine

## 2023-05-28 ENCOUNTER — Ambulatory Visit (HOSPITAL_COMMUNITY)
Admission: EM | Admit: 2023-05-28 | Discharge: 2023-05-28 | Disposition: A | Discharge status: Home / Self Care | Payer: BC Managed Care – PPO | Attending: Family Medicine | Admitting: Family Medicine

## 2023-05-28 DIAGNOSIS — M4807 Spinal stenosis, lumbosacral region: Secondary | ICD-10-CM | POA: Diagnosis not present

## 2023-05-28 DIAGNOSIS — M5137 Other intervertebral disc degeneration, lumbosacral region: Secondary | ICD-10-CM | POA: Diagnosis not present

## 2023-05-28 DIAGNOSIS — M545 Low back pain, unspecified: Secondary | ICD-10-CM

## 2023-05-28 DIAGNOSIS — K5900 Constipation, unspecified: Secondary | ICD-10-CM

## 2023-05-28 MED ORDER — LACTULOSE 10 GM/15ML PO SOLN: 20.0000 g | Freq: Two times a day (BID) | ORAL | 0 refills | Status: DC

## 2023-05-28 NOTE — ED Provider Notes (Signed)
[facility]    CSN: [id number] Arrival date & time: 05/28/23  1407      History   Chief Complaint Chief Complaint  Patient presents with   Constipation   Back Pain    HPI Adam Davis is a 47 y.o. male.    Constipation Associated symptoms: back pain   Back Pain Here for constipation since September 25.  He has had a little bit of abdominal pressure bloating but mainly some right low back pain with this.  His appetite is good and he has not had any nausea or vomiting.  No blood in the stool prior to this and BMs have been normal prior to the absence of bowel movement starting the 25th.  He does take Tums and Prograf and he has had a renal transplant.  He is not on dialysis.  Last creatinine was 6.96 in care everywhere in July.  His EGFR then was 9.    Past Medical History:  Diagnosis Date   Diabetes mellitus    H/O acute post-streptococcal glomerulonephritis 1997   History of renal transplant    Hyperlipidemia    Hypertension    Onychomycosis of toenail    Renal disorder    Tachycardia     Patient Active Problem List   Diagnosis Date Noted   Erectile dysfunction 04/18/2022   Healthcare maintenance 04/18/2022   Low back pain without sciatica 12/08/2021   Type 2 diabetes mellitus with hyperglycemia (HCC) 02/09/2021   AKI (acute kidney injury) (HCC) 02/09/2021   Uncontrolled type 2 diabetes mellitus without complication, with long-term current use of insulin 10/29/2018   Essential hypertension, benign 10/30/2012   HLD (hyperlipidemia) 08/08/2012   Onychomycosis of toenail 07/04/2012   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 Authorizing Provider  lactulose (CHRONULAC) 10 GM/15ML solution Take 30 mLs (20 g total) by mouth 2 (two) times daily. 05/28/23  Yes Zenia Resides, MD  AGAMATRIX ULTRA-THIN LANCETS MISC Check sugars three times a day 10/22/18   Westley Chandler, MD  amLODipine (NORVASC) 10 MG tablet Take 10 mg by mouth daily.    [provider]  Bayer Microlet Lancets lancets Use to check blood sugars 3 times a day. Patient not taking: Reported on 03/21/2022 12/13/18   Myrene Buddy, MD  blood glucose meter kit and supplies KIT Dispense based on patient and insurance preference. Use up to four times daily as directed. 03/21/22   Sabino Dick, DO  Blood Glucose Monitoring Suppl (ACCU-CHEK AVIVA PLUS) w/Device KIT 1 each by Does not apply route 4 (four) times daily -  before meals and at bedtime. Patient not taking: Reported on 03/21/2022 09/19/19   Myrene Buddy, MD  ezetimibe (ZETIA) 10 MG tablet Take 1 tablet (10 mg total) by mouth daily. 11/08/22   Sabino Dick, DO  glucose blood test strip Use to check blood sugars 3 times a day. Patient not taking: Reported on 03/21/2022 12/13/18   Myrene Buddy, MD  insulin glargine-yfgn [facility]) 100 UNIT/ML Pen Inject 46 Units into the skin daily. 02/01/23   Idalia Needle,  [place] J, DO  Insulin Pen Needle (PEN NEEDLES) 32G X 4 MM MISC 1 each by Does not apply route daily. 09/12/22   Sabino Dick, DO  Lancet Devices (MICROLET NEXT LANCING DEVICE) MISC 1 each by Does not apply route 3 (three) times daily. Patient not taking: Reported on 03/21/2022 12/13/18   Myrene Buddy, MD  metoprolol succinate (TOPROL-XL) 25 MG 24 hr tablet Take 1 tablet (25 mg total) by mouth at bedtime. 02/12/23   Cora Collum, DO  multivitamin (ONE-A-DAY MEN'S) TABS tablet Take 1 tablet daily by mouth.    [provider]  mycophenolate (CELLCEPT) 250 MG capsule Take 500 mg by mouth 2 (two) times daily.     [provider]   rosuvastatin (CRESTOR) 10 MG tablet Take one tablet every Monday, Wednesday, and Friday. 01/16/23   Sabino Dick, DO  sildenafil (VIAGRA) 100 MG tablet Take 100 mg by mouth daily as needed. Patient not taking: Reported on 04/18/2022 03/23/21   [provider]  tacrolimus (PROGRAF) 1 MG capsule Take 1 capsule (1 mg total) by mouth 2 (two) times daily. 10/08/13   Glori Luis, MD  terbinafine (LAMISIL) 250 MG tablet Take 250 mg by mouth daily. Patient not taking: Reported on 09/12/2022    [provider]  Whey Protein POWD Take 1 Scoop See admin instructions by mouth. 1 scoop dissolved in 4 ounces of water once a day (DRINK) Patient not taking: Reported on 03/21/2022    [provider]  atorvastatin (LIPITOR) 40 MG tablet Take 1 tablet (40 mg total) by mouth daily. Patient not taking: Reported on 07/09/2017 07/13/16 05/09/19  Palma Holter, MD    Family History Family History  Problem Relation Age of Onset   Diabetes Brother    Diabetes Maternal Grandmother    Hypertension Maternal Grandmother     Social History Social History   Tobacco Use   Smoking status: Never   Smokeless tobacco: Never  Substance Use Topics   Alcohol use: No   Drug use: No     Allergies   Atorvastatin   Review of Systems Review of Systems  Gastrointestinal:  Positive for constipation.  Musculoskeletal:  Positive for back pain.     Physical Exam Triage Vital Signs ED Triage Vitals  Encounter Vitals Group     BP --      Systolic BP Percentile --      Diastolic BP Percentile --      Pulse Rate 05/28/23 1528 100     Resp 05/28/23 1528 17     Temp 05/28/23 1528 98.6 F (37 C)     Temp Source 05/28/23 1528 Oral     SpO2 05/28/23 1528 96 %     Weight --      Height --      Head Circumference --      Peak Flow --      Pain Score 05/28/23 1526 8     Pain Loc --      Pain Education --      Exclude from Growth Chart --    No data found.  Updated  Vital Signs BP (!) 167/97 (BP Location: Left Arm)   Pulse 100   Temp 98.6 F (37 C) (Oral)   Resp 17   SpO2 96%   Visual Acuity Right Eye Distance:   Left Eye Distance:   Bilateral Distance:    Right Eye Near:   Left Eye Near:    Bilateral Near:  Physical Exam Vitals reviewed.  Constitutional:      General: He is not in acute distress.    Appearance: He is not ill-appearing, toxic-appearing or diaphoretic.  Cardiovascular:     Rate and Rhythm: Normal rate and regular rhythm.     Heart sounds: No murmur heard. Pulmonary:     Effort: Pulmonary effort is normal.     Breath sounds: Normal breath sounds.  Abdominal:     Palpations: Abdomen is soft.     Tenderness: There is no abdominal tenderness.  Musculoskeletal:     Comments: There is some tenderness of the right lumbar area.  No rash or deformity  Skin:    Coloration: Skin is not pale.  Neurological:     General: No focal deficit present.     Mental Status: He is alert and oriented to person, place, and time.  Psychiatric:        Behavior: Behavior normal.      UC Treatments / Results  Labs (all labs ordered are listed, but only abnormal results are displayed) Labs Reviewed - No data to display  EKG   Radiology No results found.  Procedures Procedures (including critical care time)  Medications Ordered in UC Medications - No data to display  Initial Impression / Assessment and Plan / UC Course  I have reviewed the triage vital signs and the nursing notes.  Pertinent labs & imaging results that were available during my care of the patient were reviewed by me and considered in my medical decision making (see chart for details).       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 There is a large stool burden with stool in the rectum and sigmoid. No sign of obstruction.  I am recommending dulcolax suppository and  Final Clinical Impressions(s) / UC Diagnoses   Final diagnoses:  Constipation,  unspecified constipation type  Acute right-sided low back pain without sciatica     Discharge Instructions      The x-ray of your spine showed just a very mild amount of arthritis changes.  There was a lot of stool in your abdomen.  Lactulose 10 g / 15 mL--take 30 mL by mouth 2 times daily.  This would replace the MiraLAX  Also get some Dulcolax/bisacodyl suppositories and use that in your rectum, to try to relieve some of the constipation and stool burden in your rectum.  If that is not effective I recommend getting an enema and doing that over-the-counter.  Hopefully if he can relieve the constipation and will help your back pain in turn.  You can take Tylenol as needed. You can also use lidocaine patches over-the-counter for your back hurts.     ED Prescriptions     Medication Sig Dispense Auth. Provider   lactulose (CHRONULAC) 10 GM/15ML solution Take 30 mLs (20 g total) by mouth 2 (two) times daily. 1,800 mL Zenia Resides, MD      I have reviewed the PDMP during this encounter.   Zenia Resides, MD 05/28/23 [phone]

## 2023-05-28 NOTE — ED Triage Notes (Signed)
Pt reports constipation and lower and upper back pain x 5 days, States have tried miralax with no relief.

## 2023-05-28 NOTE — Discharge Instructions (Addendum)
The x-ray of your spine showed just a very mild amount of arthritis changes.  There was a lot of stool in your abdomen.  Lactulose 10 g / 15 mL--take 30 mL by mouth 2 times daily.  This would replace the MiraLAX  Also get some Dulcolax/bisacodyl suppositories and use that in your rectum, to try to relieve some of the constipation and stool burden in your rectum.  If that is not effective I recommend getting an enema and doing that over-the-counter.  Hopefully if he can relieve the constipation and will help your back pain in turn.  You can take Tylenol as needed. You can also use lidocaine patches over-the-counter for your back hurts.

## 2023-07-17 DIAGNOSIS — I129 Hypertensive chronic kidney disease with stage 1 through stage 4 chronic kidney disease, or unspecified chronic kidney disease: Secondary | ICD-10-CM | POA: Diagnosis not present

## 2023-07-17 DIAGNOSIS — E1129 Type 2 diabetes mellitus with other diabetic kidney complication: Secondary | ICD-10-CM | POA: Diagnosis not present

## 2023-07-17 DIAGNOSIS — E785 Hyperlipidemia, unspecified: Secondary | ICD-10-CM | POA: Diagnosis not present

## 2023-07-17 DIAGNOSIS — Z940 Kidney transplant status: Secondary | ICD-10-CM | POA: Diagnosis not present

## 2023-07-24 ENCOUNTER — Encounter (HOSPITAL_COMMUNITY): Payer: Self-pay | Admitting: *Deleted

## 2023-07-24 ENCOUNTER — Ambulatory Visit (HOSPITAL_COMMUNITY)
Admission: EM | Admit: 2023-07-24 | Discharge: 2023-07-24 | Disposition: A | Discharge status: Home / Self Care | Payer: BC Managed Care – PPO | Attending: Family Medicine | Admitting: Family Medicine

## 2023-07-24 DIAGNOSIS — M79604 Pain in right leg: Secondary | ICD-10-CM | POA: Diagnosis not present

## 2023-07-24 DIAGNOSIS — I10 Essential (primary) hypertension: Secondary | ICD-10-CM

## 2023-07-24 MED ORDER — DICLOFENAC SODIUM 75 MG PO TBEC: 75.0000 mg | DELAYED_RELEASE_TABLET | Freq: Two times a day (BID) | ORAL | 0 refills | Status: DC

## 2023-07-24 MED ORDER — HYDROCODONE-ACETAMINOPHEN 5-325 MG PO TABS
1.0000 | ORAL_TABLET | Freq: Every evening | ORAL | 0 refills | Status: DC | PRN
Start: 2023-07-24 — End: 2023-07-31

## 2023-07-24 MED ORDER — PREDNISONE 20 MG PO TABS: 40.0000 mg | ORAL_TABLET | Freq: Every day | ORAL | 0 refills | Status: DC

## 2023-07-24 NOTE — Discharge Instructions (Signed)
Your blood pressure was noted to be elevated during your visit today. If you are currently taking medication for high blood pressure, please ensure you are taking this as directed. If you do not have a history of high blood pressure and your blood pressure remains persistently elevated, you may need to begin taking a medication at some point. You may return here within the next few days to recheck if unable to see your primary care provider or if you do not have a one.  BP (!) 164/87 (BP Location: Left Arm)   Pulse (!) 108   Temp 98.8 F (37.1 C) (Oral)   Resp 18   SpO2 96%   BP Readings from Last 3 Encounters:  07/24/23 (!) 164/87  05/28/23 (!) 167/97  02/12/23 (!) 141/88   Be aware, you blood sugar levels will rise while taking the prednisone.

## 2023-07-24 NOTE — ED Triage Notes (Signed)
Pt states that he is having right leg pain which is sharpe mostly at night X 2 days. He took tylenol.   He states no pain now the pain only comes when he is relaxed.

## 2023-07-25 NOTE — ED Provider Notes (Signed)
[facility]   [id number] 07/24/23 Arrival Time: 1337  ASSESSMENT & PLAN:  1. Right leg pain   2. Elevated blood pressure reading with diagnosis of hypertension    Without neurological abnormalities.  Discharge Medication List as of 07/24/2023  3:21 PM     START taking these medications   Details  diclofenac (VOLTAREN) 75 MG EC tablet Take 1 tablet (75 mg total) by mouth 2 (two) times daily., Starting Tue 07/24/2023, Normal    HYDROcodone-acetaminophen (NORCO/VICODIN) 5-325 MG tablet Take 1 tablet by mouth at bedtime as needed for moderate pain (pain score 4-6) or severe pain (pain score 7-10)., Starting Tue 07/24/2023, Normal    predniSONE (DELTASONE) 20 MG tablet Take 2 tablets (40 mg total) by mouth daily., Starting Tue 07/24/2023, Normal       Work/school excuse note: not needed. Recommend:  Follow-up Information     Erick Alley, DO.   Specialty: Family Medicine Why: If worsening or failing to improve as anticipated and to recheck your blood pressure. Contact information: [address] [place] [address] [phone]                [place] Controlled Substances Registry consulted for this patient. I feel the risk/benefit ratio today is favorable for proceeding with this prescription for a controlled substance. Medication sedation precautions given.    Discharge Instructions      Your blood pressure was noted to be elevated during your visit today. If you are currently taking medication for high blood pressure, please ensure you are taking this as directed. If you do not have a history of high blood pressure and your blood pressure remains persistently elevated, you may need to begin taking a medication at some point. You may return here within the next few days to recheck if unable to see your primary care provider or if you do not have a one.  BP (!) 164/87 (BP Location: Left Arm)   Pulse (!) 108   Temp 98.8 F (37.1 C) (Oral)   Resp 18    SpO2 96%   BP Readings from Last 3 Encounters:  07/24/23 (!) 164/87  05/28/23 (!) 167/97  02/12/23 (!) 141/88   Be aware, you blood sugar levels will rise while taking the prednisone.     Reviewed expectations re: course of current medical issues. Questions answered. Outlined signs and symptoms indicating need for more acute intervention. Patient verbalized understanding. After Visit Summary given.  SUBJECTIVE: History from: patient. Adam Davis is a 47 y.o. male who reports persistent R leg pain that radiates down R leg; x 2 days; gradual onset but is stable now. Worse when he is resting/relaxed. No extremity sensation changes or weakness. Normal bowel/bladder habits. Tylenol without relief. Pain is preventing sleep. Denies back/abd pain. No h/o similar.  Increased blood pressure noted today. Reports that he is treated for HTN. Taking meds as directed.  Past Surgical History:  Procedure Laterality Date   ANKLE SURGERY     AV FISTULA PLACEMENT  1997   Removed after transplant in 2006   KIDNEY SURGERY  2006   KIDNEY TRANSPLANT        OBJECTIVE:  Vitals:   07/24/23 1457  BP: (!) 164/87  Pulse: (!) 108  Resp: 18  Temp: 98.8 F (37.1 C)  TempSrc: Oral  SpO2: 96%    General appearance: alert; no distress HEENT: [place]; AT Neck: supple with FROM Resp: unlabored respirations Extremities: RLE: warm with well perfused appearance; without localized  tenderness over right lower extremity; without gross deformities; swelling: none; bruising: none; hip and knee ROM: normal SLR negative CV: brisk extremity capillary refill of RLE; 2+ DP pulse of RLE. Skin: warm and dry; no visible rashes Neurologic: gait normal; normal sensation and strength of RLE Psychological: alert and cooperative; normal mood and affect    Allergies  Allergen Reactions   Atorvastatin Other (See Comments)    Myalgia - resolved with discontinuation    Past Medical History:  Diagnosis Date    Diabetes mellitus    H/O acute post-streptococcal glomerulonephritis 1997   History of renal transplant    Hyperlipidemia    Hypertension    Onychomycosis of toenail    Renal disorder    Tachycardia    Social History   Socioeconomic History   Marital status: Married    Spouse name: Not on file   Number of children: Not on file   Years of education: Not on file   Highest education level: Not on file  Occupational History   Not on file  Tobacco Use   Smoking status: Never   Smokeless tobacco: Never  Vaping Use   Vaping status: Never Used  Substance and Sexual Activity   Alcohol use: No   Drug use: No   Sexual activity: Not Currently  Other Topics Concern   Not on file  Social History Narrative   Not on file   Social Determinants of Health   Financial Resource Strain: Not on file  Food Insecurity: Not on file  Transportation Needs: Not on file  Physical Activity: Not on file  Stress: Not on file  Social Connections: Not on file   Family History  Problem Relation Age of Onset   Diabetes Brother    Diabetes Maternal Grandmother    Hypertension Maternal Grandmother    Past Surgical History:  Procedure Laterality Date   ANKLE SURGERY     AV FISTULA PLACEMENT  1997   Removed after transplant in 2006   KIDNEY SURGERY  2006   KIDNEY TRANSPLANT         Mardella Layman, MD 07/25/23 1216

## 2023-07-31 ENCOUNTER — Other Ambulatory Visit: Payer: Self-pay

## 2023-07-31 ENCOUNTER — Emergency Department (HOSPITAL_COMMUNITY): Payer: BC Managed Care – PPO

## 2023-07-31 ENCOUNTER — Encounter (HOSPITAL_COMMUNITY): Payer: Self-pay | Admitting: Emergency Medicine

## 2023-07-31 ENCOUNTER — Inpatient Hospital Stay (HOSPITAL_COMMUNITY)
Admission: EM | Admit: 2023-07-31 | Discharge: 2023-08-07 | DRG: 674 | Disposition: A | Discharge status: Home / Self Care | Payer: BC Managed Care – PPO | Attending: Family Medicine | Admitting: Family Medicine

## 2023-07-31 DIAGNOSIS — Z6826 Body mass index (BMI) 26.0-26.9, adult: Secondary | ICD-10-CM

## 2023-07-31 DIAGNOSIS — D72829 Elevated white blood cell count, unspecified: Secondary | ICD-10-CM

## 2023-07-31 DIAGNOSIS — R630 Anorexia: Secondary | ICD-10-CM | POA: Diagnosis present

## 2023-07-31 DIAGNOSIS — N179 Acute kidney failure, unspecified: Secondary | ICD-10-CM | POA: Diagnosis not present

## 2023-07-31 DIAGNOSIS — Z79624 Long term (current) use of inhibitors of nucleotide synthesis: Secondary | ICD-10-CM

## 2023-07-31 DIAGNOSIS — T8612 Kidney transplant failure: Secondary | ICD-10-CM | POA: Diagnosis not present

## 2023-07-31 DIAGNOSIS — E876 Hypokalemia: Secondary | ICD-10-CM

## 2023-07-31 DIAGNOSIS — Z992 Dependence on renal dialysis: Secondary | ICD-10-CM

## 2023-07-31 DIAGNOSIS — Z8249 Family history of ischemic heart disease and other diseases of the circulatory system: Secondary | ICD-10-CM

## 2023-07-31 DIAGNOSIS — E875 Hyperkalemia: Secondary | ICD-10-CM | POA: Diagnosis not present

## 2023-07-31 DIAGNOSIS — N185 Chronic kidney disease, stage 5: Secondary | ICD-10-CM | POA: Diagnosis not present

## 2023-07-31 DIAGNOSIS — Z940 Kidney transplant status: Secondary | ICD-10-CM

## 2023-07-31 DIAGNOSIS — M898X9 Other specified disorders of bone, unspecified site: Secondary | ICD-10-CM | POA: Diagnosis present

## 2023-07-31 DIAGNOSIS — N186 End stage renal disease: Secondary | ICD-10-CM | POA: Diagnosis present

## 2023-07-31 DIAGNOSIS — R5383 Other fatigue: Secondary | ICD-10-CM | POA: Diagnosis not present

## 2023-07-31 DIAGNOSIS — E1122 Type 2 diabetes mellitus with diabetic chronic kidney disease: Secondary | ICD-10-CM | POA: Diagnosis present

## 2023-07-31 DIAGNOSIS — E11649 Type 2 diabetes mellitus with hypoglycemia without coma: Secondary | ICD-10-CM | POA: Diagnosis not present

## 2023-07-31 DIAGNOSIS — Z888 Allergy status to other drugs, medicaments and biological substances status: Secondary | ICD-10-CM | POA: Diagnosis not present

## 2023-07-31 DIAGNOSIS — I120 Hypertensive chronic kidney disease with stage 5 chronic kidney disease or end stage renal disease: Secondary | ICD-10-CM | POA: Diagnosis not present

## 2023-07-31 DIAGNOSIS — D631 Anemia in chronic kidney disease: Secondary | ICD-10-CM

## 2023-07-31 DIAGNOSIS — Z79621 Long term (current) use of calcineurin inhibitor: Secondary | ICD-10-CM | POA: Diagnosis not present

## 2023-07-31 DIAGNOSIS — Z833 Family history of diabetes mellitus: Secondary | ICD-10-CM

## 2023-07-31 DIAGNOSIS — R112 Nausea with vomiting, unspecified: Secondary | ICD-10-CM | POA: Insufficient documentation

## 2023-07-31 DIAGNOSIS — Y830 Surgical operation with transplant of whole organ as the cause of abnormal reaction of the patient, or of later complication, without mention of misadventure at the time of the procedure: Secondary | ICD-10-CM | POA: Diagnosis present

## 2023-07-31 DIAGNOSIS — Z79899 Other long term (current) drug therapy: Secondary | ICD-10-CM

## 2023-07-31 DIAGNOSIS — D649 Anemia, unspecified: Secondary | ICD-10-CM | POA: Diagnosis not present

## 2023-07-31 DIAGNOSIS — E785 Hyperlipidemia, unspecified: Secondary | ICD-10-CM | POA: Diagnosis present

## 2023-07-31 DIAGNOSIS — T82898A Other specified complication of vascular prosthetic devices, implants and grafts, initial encounter: Secondary | ICD-10-CM | POA: Diagnosis not present

## 2023-07-31 DIAGNOSIS — I517 Cardiomegaly: Secondary | ICD-10-CM | POA: Diagnosis not present

## 2023-07-31 DIAGNOSIS — E872 Acidosis, unspecified: Secondary | ICD-10-CM | POA: Diagnosis present

## 2023-07-31 LAB — COMPREHENSIVE METABOLIC PANEL
ALT: 25 U/L (ref 0–44)
AST: 23 U/L (ref 15–41)
Albumin: 3.2 g/dL — ABNORMAL LOW (ref 3.5–5.0)
Alkaline Phosphatase: 70 U/L (ref 38–126)
Anion gap: 23 — ABNORMAL HIGH (ref 5–15)
BUN: 172 mg/dL — ABNORMAL HIGH (ref 6–20)
CO2: 14 mmol/L — ABNORMAL LOW (ref 22–32)
Calcium: 5.4 mg/dL — CL (ref 8.9–10.3)
Chloride: 98 mmol/L (ref 98–111)
Creatinine, Ser: 26.24 mg/dL — ABNORMAL HIGH (ref 0.61–1.24)
GFR, Estimated: 2 mL/min — ABNORMAL LOW (ref >60)
Glucose, Bld: 102 mg/dL — ABNORMAL HIGH (ref 70–99)
Potassium: 2.8 mmol/L — ABNORMAL LOW (ref 3.5–5.1)
Sodium: 135 mmol/L (ref 135–145)
Total Bilirubin: 0.5 mg/dL (ref <1.2)
Total Protein: 6.2 g/dL — ABNORMAL LOW (ref 6.5–8.1)

## 2023-07-31 LAB — URINALYSIS, W/ REFLEX TO CULTURE (INFECTION SUSPECTED)
Bacteria, UA: NONE SEEN
Bilirubin Urine: NEGATIVE
Glucose, UA: 50 mg/dL — AB
Ketones, ur: NEGATIVE mg/dL
Leukocytes,Ua: NEGATIVE
Nitrite: NEGATIVE
Protein, ur: >=300 mg/dL — AB
Specific Gravity, Urine: 1.011 (ref 1.005–1.030)
pH: 5 (ref 5.0–8.0)

## 2023-07-31 LAB — I-STAT CG4 LACTIC ACID, ED
Lactic Acid, Venous: 0.3 mmol/L — ABNORMAL LOW (ref 0.5–1.9)
Lactic Acid, Venous: 0.4 mmol/L — ABNORMAL LOW (ref 0.5–1.9)

## 2023-07-31 LAB — CBC WITH DIFFERENTIAL/PLATELET
Abs Immature Granulocytes: 0.1 10*3/uL — ABNORMAL HIGH (ref 0.00–0.07)
Basophils Absolute: 0 10*3/uL (ref 0.0–0.1)
Basophils Relative: 0 %
Eosinophils Absolute: 0.2 10*3/uL (ref 0.0–0.5)
Eosinophils Relative: 1 %
HCT: 20.5 % — ABNORMAL LOW (ref 39.0–52.0)
Hemoglobin: 7.1 g/dL — ABNORMAL LOW (ref 13.0–17.0)
Immature Granulocytes: 1 %
Lymphocytes Relative: 10 %
Lymphs Abs: 1.3 10*3/uL (ref 0.7–4.0)
MCH: 24.6 pg — ABNORMAL LOW (ref 26.0–34.0)
MCHC: 34.6 g/dL (ref 30.0–36.0)
MCV: 70.9 fL — ABNORMAL LOW (ref 80.0–100.0)
Monocytes Absolute: 1.4 10*3/uL — ABNORMAL HIGH (ref 0.1–1.0)
Monocytes Relative: 11 %
Neutro Abs: 10.2 10*3/uL — ABNORMAL HIGH (ref 1.7–7.7)
Neutrophils Relative %: 77 %
Platelets: 209 10*3/uL (ref 150–400)
RBC: 2.89 MIL/uL — ABNORMAL LOW (ref 4.22–5.81)
RDW: 13.9 % (ref 11.5–15.5)
WBC: 13.2 10*3/uL — ABNORMAL HIGH (ref 4.0–10.5)
nRBC: 0 % (ref 0.0–0.2)

## 2023-07-31 LAB — RESP PANEL BY RT-PCR (RSV, FLU A&B, COVID)  RVPGX2
Influenza A by PCR: NEGATIVE
Influenza B by PCR: NEGATIVE
Resp Syncytial Virus by PCR: NEGATIVE
SARS Coronavirus 2 by RT PCR: NEGATIVE

## 2023-07-31 LAB — APTT: aPTT: 36 s (ref 24–36)

## 2023-07-31 LAB — PROTIME-INR
INR: 1.2 (ref 0.8–1.2)
Prothrombin Time: 15.8 s — ABNORMAL HIGH (ref 11.4–15.2)

## 2023-07-31 LAB — LIPASE, BLOOD: Lipase: 65 U/L — ABNORMAL HIGH (ref 11–51)

## 2023-07-31 MED ORDER — POTASSIUM CHLORIDE 10 MEQ/100ML IV SOLN
10.0000 meq | INTRAVENOUS | Status: AC
  Administered 2023-07-31 (×2): 10 meq via INTRAVENOUS
  Filled 2023-07-31: qty 100

## 2023-07-31 MED ORDER — CALCIUM GLUCONATE-NACL 1-0.675 GM/50ML-% IV SOLN
1.0000 g | Freq: Once | INTRAVENOUS | Status: AC
  Administered 2023-07-31: 1000 mg via INTRAVENOUS
  Filled 2023-07-31: qty 50

## 2023-07-31 MED ORDER — LACTATED RINGERS IV BOLUS (SEPSIS): 1000.0000 mL | Freq: Once | INTRAVENOUS | Status: DC

## 2023-07-31 MED ORDER — LACTATED RINGERS IV SOLN: INTRAVENOUS | Status: DC

## 2023-07-31 MED ORDER — TACROLIMUS 1 MG PO CAPS
1.0000 mg | ORAL_CAPSULE | Freq: Two times a day (BID) | ORAL | Status: DC
  Administered 2023-07-31 – 2023-08-05 (×11): 1 mg via ORAL
  Filled 2023-07-31 (×12): qty 1

## 2023-07-31 MED ORDER — CHLORHEXIDINE GLUCONATE CLOTH 2 % EX PADS
6.0000 | MEDICATED_PAD | Freq: Every day | CUTANEOUS | Status: DC
  Administered 2023-08-02 – 2023-08-07 (×6): 6 via TOPICAL

## 2023-07-31 MED ORDER — LACTATED RINGERS IV BOLUS (SEPSIS): 250.0000 mL | Freq: Once | INTRAVENOUS | Status: DC

## 2023-07-31 MED ORDER — POTASSIUM CHLORIDE 10 MEQ/100ML IV SOLN
10.0000 meq | Freq: Once | INTRAVENOUS | Status: DC
  Filled 2023-07-31: qty 100

## 2023-07-31 MED ORDER — ONDANSETRON HCL 4 MG/2ML IJ SOLN
4.0000 mg | Freq: Four times a day (QID) | INTRAMUSCULAR | Status: DC | PRN
  Administered 2023-08-01: 4 mg via INTRAVENOUS
  Filled 2023-07-31: qty 2

## 2023-07-31 MED ORDER — DARBEPOETIN ALFA 40 MCG/0.4ML IJ SOSY
40.0000 ug | PREFILLED_SYRINGE | INTRAMUSCULAR | Status: DC
  Administered 2023-08-01: 40 ug via SUBCUTANEOUS
  Filled 2023-07-31: qty 0.4

## 2023-07-31 MED ORDER — LACTATED RINGERS IV BOLUS (SEPSIS)
1000.0000 mL | Freq: Once | INTRAVENOUS | Status: AC
  Administered 2023-07-31: 1000 mL via INTRAVENOUS

## 2023-07-31 MED ORDER — MYCOPHENOLATE MOFETIL 250 MG PO CAPS
250.0000 mg | ORAL_CAPSULE | Freq: Two times a day (BID) | ORAL | Status: DC
  Administered 2023-07-31 – 2023-08-07 (×14): 250 mg via ORAL
  Filled 2023-07-31 (×15): qty 1

## 2023-07-31 MED ORDER — ONDANSETRON HCL 4 MG/2ML IJ SOLN
4.0000 mg | Freq: Four times a day (QID) | INTRAMUSCULAR | Status: DC | PRN
  Administered 2023-07-31: 4 mg via INTRAVENOUS
  Filled 2023-07-31: qty 2

## 2023-07-31 NOTE — Sepsis Progress Note (Signed)
Sepsis protocol monitored by eLink ?

## 2023-07-31 NOTE — Consult Note (Signed)
[facility] Renal Consultation Note  Requesting MD: Benjiman Core, MD Indication for Consultation:  advanced CKD  Chief complaint: abnormal labs and not eating  HPI:  Adam Davis is a 47 y.o. male with a history of deceased donor renal transplant, CKD stage V, diabetes mellitus, and HTN who presented to the hospital after nausea and decreased p.o. intake with worsening outpatient labs.  He is not eating.  He indicated to the ER MD that his labs from 11/19 showed "rejection".  He follows with our office with Dr. Kathrene Bongo and was last seen on 11/19.  At that time, his creatinine was 18, GFR 3, potassium 3.3, BUN 103, bicarbonate 14, and hemoglobin 7.3.  He was referred for transplant evaluation at [facility] per charting (for consideration of another transplant).  Note that on comparison, labs on May 15, 2023 demonstrated Cr 8 and BUN 59 and labs 8 months ago demonstrated Cr 4.26.  His transplant was 18 years ago.  Per charting outpatient and per the ER provider this is all coming as quite a shock to the patient.  Nephrology is consulted for assistance with management of progressive CKD.   He denies any fevers/chills.  States got an NSAID a week ago for leg pain but it made him feel worse.  The ER gave the patient 1 liter of LR and started him on a rate of 150 ml/hr.  He does not want to call his wife because he does not want to bother her with this - he states that she is out of town in [place].  I offered to call her multiple times and he declined - he is going to reach out to his family to tell them.  He and I had a long conversation and discussed risks/benefits/indications for starting dialysis and he does consent to starting dialysis.  His medicine team arrived at bedside to assess him and joined our interview.  The patient and I discussed home dialysis.  He is still working and is active so this is something he wanted more information about.  Previously had an AVF and was on  HD; he states his AVF was removed at [facility] after it became infected.      Creatinine, Ser  Date/Time Value Ref Range Status  07/31/2023 04:32 PM 26.24 (H) 0.61 - 1.24 mg/dL Final    Comment:    RESULT CONFIRMED BY MANUAL DILUTION  11/07/2022 05:00 PM 4.26 (H) 0.76 - 1.27 mg/dL Final  16/05/9603 54:09 PM 2.14 (H) 0.76 - 1.27 mg/dL Final  81/19/1478 29:56 PM 1.97 (H) 0.76 - 1.27 mg/dL Final  21/30/8657 84:69 AM 2.38 (H) 0.76 - 1.27 mg/dL Final  62/95/2841 32:44 PM 1.44 (H) 0.76 - 1.27 mg/dL Final  08/30/7251 66:44 PM 1.43 (H) 0.76 - 1.27 mg/dL Final  03/47/4259 56:38 AM 1.72 (H) 0.76 - 1.27 mg/dL Final  75/64/3329 51:88 PM 1.75 (H) 0.61 - 1.24 mg/dL Final  41/66/0630 16:01 PM 1.95 (H) 0.76 - 1.27 mg/dL Final  09/32/3557 32:20 AM 1.73 (H) 0.61 - 1.24 mg/dL Final  25/42/7062 37:62 PM 1.67 (H) 0.50 - 1.35 mg/dL Final  83/15/1761 60:73 PM 1.60 (H) 0.50 - 1.35 mg/dL Final  71/01/2693 85:46 PM 1.65 (H) 0.40 - 1.50 mg/dL Final  27/10/5007 38:18 AM 1.68 (H) 0.4 - 1.5 mg/dL Final     PMHx:   Past Medical History:  Diagnosis Date   Diabetes mellitus    H/O acute post-streptococcal glomerulonephritis 1997   History of renal transplant  Hyperlipidemia    Hypertension    Onychomycosis of toenail    Renal disorder    Tachycardia     Past Surgical History:  Procedure Laterality Date   ANKLE SURGERY     AV FISTULA PLACEMENT  1997   Removed after transplant in 2006   KIDNEY SURGERY  2006   KIDNEY TRANSPLANT      Family Hx:  Family History  Problem Relation Age of Onset   Diabetes Brother    Diabetes Maternal Grandmother    Hypertension Maternal Grandmother     Social History:  reports that he has never smoked. He has never used smokeless tobacco. He reports that he does not drink alcohol and does not use drugs.  Allergies:  Allergies  Allergen Reactions   Atorvastatin Other (See Comments)    Myalgia - resolved with discontinuation    Medications: Prior to Admission  medications   Medication Sig Start Date End Date Taking? Authorizing Provider  Chad Cordial ULTRA-THIN LANCETS MISC Check sugars three times a day 10/22/18   Westley Chandler, MD  amLODipine (NORVASC) 10 MG tablet Take 10 mg by mouth daily.    [provider]  Bayer Microlet Lancets lancets Use to check blood sugars 3 times a day. Patient not taking: Reported on 03/21/2022 12/13/18   Myrene Buddy, MD  blood glucose meter kit and supplies KIT Dispense based on patient and insurance preference. Use up to four times daily as directed. 03/21/22   Sabino Dick, DO  Blood Glucose Monitoring Suppl (ACCU-CHEK AVIVA PLUS) w/Device KIT 1 each by Does not apply route 4 (four) times daily -  before meals and at bedtime. Patient not taking: Reported on 03/21/2022 09/19/19   Myrene Buddy, MD  diclofenac (VOLTAREN) 75 MG EC tablet Take 1 tablet (75 mg total) by mouth 2 (two) times daily. 07/24/23   Mardella Layman, MD  furosemide (LASIX) 40 MG tablet Take 40 mg by mouth daily. 06/28/23   [provider]  glucose blood test strip Use to check blood sugars 3 times a day. Patient not taking: Reported on 03/21/2022 12/13/18   Myrene Buddy, MD  HYDROcodone-acetaminophen (NORCO/VICODIN) 5-325 MG tablet Take 1 tablet by mouth at bedtime as needed for moderate pain (pain score 4-6) or severe pain (pain score 7-10). 07/24/23   Mardella Layman, MD  insulin glargine-yfgn (SEMGLEE) 100 UNIT/ML Pen Inject 46 Units into the skin daily. 02/01/23   Cora Collum, DO  Insulin Pen Needle (PEN NEEDLES) 32G X 4 MM MISC 1 each by Does not apply route daily. 09/12/22   Sabino Dick, DO  Lancet Devices (MICROLET NEXT LANCING DEVICE) MISC 1 each by Does not apply route 3 (three) times daily. Patient not taking: Reported on 03/21/2022 12/13/18   Myrene Buddy, MD  multivitamin (ONE-A-DAY MEN'S) TABS tablet Take 1 tablet daily by mouth.    [provider]  mycophenolate (CELLCEPT) 250 MG capsule Take  500 mg by mouth 2 (two) times daily.     [provider]  predniSONE (DELTASONE) 20 MG tablet Take 2 tablets (40 mg total) by mouth daily. 07/24/23   Mardella Layman, MD  rosuvastatin (CRESTOR) 10 MG tablet Take one tablet every Monday, Wednesday, and Friday. 01/16/23   Sabino Dick, DO  sildenafil (VIAGRA) 100 MG tablet Take 100 mg by mouth daily as needed. Patient not taking: Reported on 04/18/2022 03/23/21   [provider]  spironolactone (ALDACTONE) 25 MG tablet Take 25 mg by mouth at bedtime. 05/13/23  [provider]  tacrolimus (PROGRAF) 1 MG capsule Take 1 capsule (1 mg total) by mouth 2 (two) times daily. 10/08/13   Glori Luis, MD  atorvastatin (LIPITOR) 40 MG tablet Take 1 tablet (40 mg total) by mouth daily. Patient not taking: Reported on 07/09/2017 07/13/16 05/09/19  Palma Holter, MD   I have reviewed the patient's current and reported prior to admission medications.  Labs:     Latest Ref Rng & Units 07/31/2023    4:32 PM 11/07/2022    5:00 PM 12/08/2021    2:56 PM  BMP  Glucose 70 - 99 mg/dL 010  75  81   BUN 6 - 20 mg/dL 272  36  22   Creatinine 0.61 - 1.24 mg/dL 53.66  4.40  3.47   BUN/Creat Ratio 9 - 20  8  10    Sodium 135 - 145 mmol/L 135  140  143   Potassium 3.5 - 5.1 mmol/L 2.8  3.9  4.0   Chloride 98 - 111 mmol/L 98  105  106   CO2 22 - 32 mmol/L 14  19  23    Calcium 8.9 - 10.3 mg/dL 5.4  8.2  9.4     Urinalysis    Component Value Date/Time   COLORURINE YELLOW 07/09/2017 1430   APPEARANCEUR CLEAR 07/09/2017 1430   LABSPEC 1.017 07/09/2017 1430   PHURINE 5.0 07/09/2017 1430   GLUCOSEU >=500 (A) 07/09/2017 1430   HGBUR SMALL (A) 07/09/2017 1430   BILIRUBINUR negative 12/08/2021 1047   BILIRUBINUR NEG 04/02/2013 1419   KETONESUR negative 12/08/2021 1047   KETONESUR NEGATIVE 07/09/2017 1430   PROTEINUR >=300 (A) 12/08/2021 1047   PROTEINUR 100 (A) 07/09/2017 1430   UROBILINOGEN 0.2 12/08/2021 1047   UROBILINOGEN  0.2 05/16/2012 2009   NITRITE Negative 12/08/2021 1047   NITRITE NEGATIVE 07/09/2017 1430   LEUKOCYTESUR Negative 12/08/2021 1047     ROS:  Pertinent items noted in HPI and remainder of comprehensive ROS otherwise negative.  Physical Exam: Vitals:   07/31/23 1947 07/31/23 1952  BP: (!) 158/88   Pulse: 97   Resp: 17   Temp:  98 F (36.7 C)  SpO2: 100%      General: adult male in bed in NAD   HEENT: NCAT Eyes: EOMI sclera anicteric  Neck: supple trachea midline  Heart: S1S2 no rub Lungs: clear and unlabored; normal work of breathing Abdomen: soft/nt/nd; no bruit over LLQ transplant Extremities: no edema; no cyanosis or clubbing Skin: no rash on extremities exposed  Psych normal mood and affect Neuro: alert and oriented x 3 provides hx and follows commands  Assessment/Plan:   # CKD stage V with progression to ESRD  - Progressive CKD with uremic symptoms - NPO after midnight for access placement for HD initiation  - Note that the ER gave him LR 1 liter and started a rate of 150 ml/hr of LR.  Will reduce LR to 75 ml/hr x 12 hours.  Will then need to reassess fluids - HD tomorrow after dialysis access placement - Consult IR for tunneled catheter placement  - Will obtain vein mapping   - Please avoid NSAID's in this patient - he is listed as being on diclofenac and would not resume - I have ordered a renal transplant ultrasound with duplex for completion - Will need to reach out to HD SW to [facility] the patient to an outpatient unit.  May be interested in home therapy.   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  Will need to wean regimen   # HTN  - Acceptable on current regimen for now - Avoid  hypotension  - Hold lasix and spironolactone     # Hypokalemia - Replete with 20 meq potassium  - Note he was also give LR - Repeat renal panel in AM   # Metabolic acidosis - Starting HD   # Hypocalcemia - s/p calcium gluconate in the ER - Due to progressive CKD  - Check intact PTH   # Anemia of CKD  - Transfuse for Hb less than 7 - Check iron panel - add on  - CBC in AM  - Start aranesp 40 mcg weekly and titrate up outpatient   # Metabolic bone disease  - Check intact PTH  - phos in AM   Will taper gradually off of his immunosuppression   Estanislado Emms 07/31/2023, 10:17 PM

## 2023-07-31 NOTE — ED Notes (Signed)
ED PA made aware of Calcium 5.4

## 2023-07-31 NOTE — ED Triage Notes (Signed)
Pt here for nausea, diarrhea, fatigue, and body aches since Wednesday. Denies abd pain.

## 2023-07-31 NOTE — Assessment & Plan Note (Signed)
Hgb 7.1, no recent baseline. No bleeding suspected even in setting of severely elevated BUN. Likely secondary to chronic disease.  -Hold DVT prophylaxis -AM CBC, Iron, TIBC, Ferritin -Transfusion threshold <7 -Initiate Aranesp q2week

## 2023-07-31 NOTE — Assessment & Plan Note (Addendum)
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 Cr 26.54, progressively worsened in the last several months per nephrology. Unable to see previous results in chart as he follows with [facility]. BUN 172 although fortunately mentating well. Remains high risk for emergent dialysis for uremia. Process is likely acute on chronic with multiple precipitating factors, including a steady decline in function over the last several months. No evidence of fluid overload.  -Admit to FMTS, progressive care, attending Dr. Linwood Dibbles -Nephrology following, recommendations as listed below:  1.  N.p.o. for access placement for HD initiation, IR consult for tunnel cath placement, obtain vein mapping  2.  mIVF LR 75 mL an hour for 12 hours  3.  Consult IR for tunneled catheter placement  4.  Reduce CellCept to 250 mg twice daily  5.  Continue Prograf 1 mg twice daily, check tacrolimus level  6.  Nephro plans to wean immunosuppression beginning tomorrow -Continuous cardiac monitoring -Monitor mental status -Avoid NSAIDs and nephrotoxic medications

## 2023-07-31 NOTE — H&P (Cosign Needed Addendum)
Hospital Admission History and Physical Service Pager: [phone]  Patient name: Adam Davis Medical record number: [id number] Date of Birth: July 19, 1976 Age: 47 y.o. Gender: male  Primary Care Provider: Annie Sable, MD Consultants: Nephrology Code Status: Full Code Preferred Emergency Contact:  Contact Information     Name Relation Home Work Mobile   Haberle,Mable Mother [phone]     McCoy,Tranesse Spouse   [phone]      Other Contacts   None on File     Chief Complaint: Nausea/vomiting, diarrhea  Assessment and Plan: Adam Davis is a 47 y.o. male presenting with nausea, vomiting and diarrhea, now with AKI, and electrolyte derangement. Differential for this patient's presentation of this includes acute on chronic kidney failure, decreased oral intake with increased losses, gastroenteritis, pancreatitis, UTI, drug effect, and euglycemic DKA. Patient is a kidney transplant recipient and is followed by nephrology outpatient, primary is Dr. Kathrene Bongo. They are aware and following.  Patient's current condition is likely multifactorial, as he has had multiple recent outpatient labs which show fluctuations in his kidney function. Given his recent nausea and vomiting and elevated white count, acute infection, likely viral, is a possibility. UTI is less likely given his UA in the ED was not indicative of infection. Euglycemic DKA is also less likely given his overall low blood sugars and the lack of ketones in his urine. He was recently given voltaren at urgent care which he used, so drug effect in the setting of chronic renal disease could also be a factor. 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 Cr 26.54, progressively worsened in the last several months per nephrology. Unable to see previous results  in chart as he follows with [facility]. BUN 172 although fortunately mentating well. Remains high risk for emergent dialysis for uremia. Process is likely acute on chronic with multiple precipitating factors, including a steady decline in function over the last several months. No evidence of fluid overload.  -Admit to FMTS, progressive care, attending Dr. Linwood Dibbles -Nephrology following, recommendations as listed below:  1.  N.p.o. for access placement for HD initiation, IR consult for tunnel cath placement, obtain vein mapping  2.  mIVF LR 75 mL an hour for 12 hours  3.  Consult IR for tunneled catheter placement  4.  Reduce CellCept to 250 mg twice daily  5.  Continue Prograf 1 mg twice daily, check tacrolimus level  6.  Nephro plans to wean immunosuppression beginning tomorrow -Continuous cardiac monitoring -Monitor mental status -Avoid NSAIDs and nephrotoxic medications Anemia due to stage 5 chronic kidney disease, 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) Hgb 7.1, no recent baseline. No bleeding suspected even in setting of severely elevated BUN. Likely secondary to chronic disease.  -Hold DVT prophylaxis -AM CBC, Iron, TIBC, Ferritin -Transfusion threshold <7 -Initiate Aranesp q2week Nausea vomiting and diarrhea Stable with zofran. Uremia vs. viral gastroenteritis. UA with glucose 50, no ketones. Overall reassuring this is not related to DKA, will continue with supportive care. Flu/covid/RSV negative.  -HgbA1c -PRN IV zofran q6h Hypocalcemia Ca 5.4, corrected to 6.0 with albumin 3.2. S/p Calcium gluconate 1g x2. -AM RFP, PTH Hypokalemia K 2.8, received IV K x35mEq and currently on LR. No U waves appreciable on EKG. Should stablize further with calcium repletion and dialysis.  -AM RFP, Mg  Chronic and Stable  Conditions: HTN: holding lasix and spironolactone per nephrology (will need to investigate whether he was taking these), restart amlodipine tomorrow following dialysis Diabetes:  currently managed with 30 u insulin semglee, holding due to NPO and low CBGs.    FEN/GI: NPO VTE Prophylaxis: SCDs due to increased bleeding risk  Disposition: progressive care  History of Present Illness:  Adam Davis is a 47 y.o. male presenting with nausea and vomiting. Started feeling poorly last Wednesday. He was having pain in his legs, went to UC and was given medicine (Voltaren, Prednisone, Norco). When he started taking medicine he felt worse. He denies blood in vomit/stool. He has been urinating normally. Denies fevers. He has not been able to eat since  Thanksgiving. He has been able to keep down fluids. He has not taken anything for the nausea.  Denies any bleeding or increased bruising.   In the ED, CBC and CMP were drawn and showed hypokalemia and hypocalcemia, as well as elevated BUN and creatinine.  Nephrology was consulted.  Potassium and calcium supplementation were given as well as a 1 L bolus of LR with maintenance rate of 150 an hour.  Respiratory pathogen panel including COVID flu and RSV was negative.  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  Pertinent Social History: Tobacco use: No Alcohol use: No Other Substance use: No Lives with wife and 3 children ages 37, 65 and 3  Pertinent Family History: Diabetes, hypertension  Remainder reviewed in history tab.   Important Outpatient Medications: Semglee 30u daily (took this today) Amlodipine 10mg  daily (took this AM) Mycophenolate 500mg  BID (took this AM) Tacrolimus 1mg  BID (took this AM) Remainder reviewed in medication history.   Objective: BP (!) 160/84   Pulse 100   Temp 98 F (36.7 C) (Oral)   Resp (!) 21   Ht 5\' 6"  (1.676 m)   Wt 73.7 kg   SpO2 100%   BMI 26.22 kg/m   Exam: General: Alert, oriented.  No apparent distress Eyes: PERRLA EOMI, conjunctiva pallor noted Cardiovascular: RRR, no M/R/G Respiratory: CTAB, no increased work of breathing Gastrointestinal: Flat, soft, nontender.  No evidence of swelling or mass. Neuro: Able to move all 4 extremities appropriately and spontaneously.  Intact strength bilaterally.  Mild to moderate bilateral tremor Extremities: no pitting edema of BLEs Derm: No evidence of bruising  Labs:  CBC BMET  Recent Labs  Lab 07/31/23 1632  WBC 13.2*  HGB 7.1*  HCT 20.5*  PLT 209   Recent Labs  Lab 07/31/23 1632  NA 135  K 2.8*  CL 98  CO2 14*  BUN 172*  CREATININE 26.24*  GLUCOSE 102*  CALCIUM 5.4*    Lactic acid 0.3 Lipase 65 PT/INR-15.8 and 1.2 respectively APTT-36 Urinalysis-cloudy yellow, 50 glucose, moderate hemoglobin, negative ketones and leukocytes.  Protein greater than or equal to 300.  No bacteria  EKG: Sinus tachycardia, positive axis, no evidence of U wave, mildly prolonged QT normal, no T wave abnormalities.  Imaging Studies Performed: Chest x-ray No obvious acute findings  Gerrit Heck, DO 07/31/2023, 11:26 PM PGY-1, [facility]  FPTS Intern pager: [phone], text pages welcome Secure chat group [facility] Teaching Service   I was personally present and performed or re-performed the history, physical exam and medical decision making activities of this service and  have verified that the service and findings are accurately documented in the resident's note.  Shelby Mattocks, DO                  07/31/2023, 11:50 PM

## 2023-07-31 NOTE — ED Provider Triage Note (Signed)
Emergency Medicine Provider Triage Evaluation Note  Adam Davis , a 47 y.o. male  was evaluated in triage.  Pt complains of Nausea, vomiting, diarrhea, fatigue, body aches since.  Review of Systems  Positive:  Negative:   Physical Exam  BP (!) 152/86 (BP Location: Right Arm)   Pulse (!) 104   Temp 98.2 F (36.8 C) (Oral)   Resp 18   Ht 5\' 6"  (1.676 m)   Wt 73.7 kg   SpO2 97%   BMI 26.22 kg/m  Gen:   Awake, no distress   Resp:  Normal effort  MSK:   Moves extremities without difficulty  Other:    Medical Decision Making  Medically screening exam initiated at 4:25 PM.  Appropriate orders placed.  Adam Davis was informed that the remainder of the evaluation will be completed by another provider, this initial triage assessment does not replace that evaluation, and the importance of remaining in the ED until their evaluation is complete.  Nausea, vomiting, diarrhea, fatigue, body aches since Wednesday. Last episode of diarrhea/vomiting this morning - denies hematemesis/hematochezia. Patient with kidney transplant in 2006 and is still on immunosuppressing medication. Endorses chills.  Denies recent ABX use, hx of crohn's/IBS, recent travel, drinking from unsafe water sources, suspicious food intake. Denies fever, chest pain, dyspnea, cough, abdominal pain, dysuria, hematuria.     Adam Davis, [place] 07/31/23 [phone]

## 2023-07-31 NOTE — ED Notes (Signed)
[phone] Helmut Muster pt daughter called for a update

## 2023-07-31 NOTE — Assessment & Plan Note (Deleted)
Cr 26.54, progressively worsened in the last several months per nephrology. Unable to see these results in chart as he follows with [facility]. Likely acute on chronic with multiple precipitating factors, including a steady decline in function over the last several months.  -Admit to FMTS, progressive care, attending Dr. Linwood Dibbles - Nephrology following, recommendations as listed below:  1.  N.p.o. for access placement for HD initiation  2.  mIVF LR 75 mL an hour for 12 hours  3.  Consult IR for tunneled catheter placement  4.  Reduce CellCept to 250 mg twice daily  5.  Continue Prograf 1 mg twice daily  6.  Nephro plans to wean immunosuppression beginning tomorrow -Restart amlodipine following dialysis if needed -Hold Lasix and spironolactone (do not suspect he is taking these) - AM CBC, Ferritin, Hep B panel, Iron TIBC, Mag, PTH, RFP, and tacrolimus level

## 2023-07-31 NOTE — ED Provider Notes (Signed)
[facility] EMERGENCY DEPARTMENT AT [facility] Provider Note   CSN: [id number] Arrival date & time: 07/31/23  1503     History  Chief Complaint  Patient presents with   Nausea    Adam Davis is a 48 y.o. male.  HPI Patient bedside nausea vomiting diarrhea.  Has been at least a week.  Feeling bad.  History of renal transplant around 18 years ago at [facility].  Sees [facility].  No fevers.  Has had decreased oral intake.    Home Medications Prior to Admission medications   Medication Sig Start Date End Date Taking? Authorizing Provider  Chad Cordial ULTRA-THIN LANCETS MISC Check sugars three times a day 10/22/18   Westley Chandler, MD  amLODipine (NORVASC) 10 MG tablet Take 10 mg by mouth daily.    [provider]  Bayer Microlet Lancets lancets Use to check blood sugars 3 times a day. Patient not taking: Reported on 03/21/2022 12/13/18   Myrene Buddy, MD  blood glucose meter kit and supplies KIT Dispense based on patient and insurance preference. Use up to four times daily as directed. 03/21/22   Sabino Dick, DO  Blood Glucose Monitoring Suppl (ACCU-CHEK AVIVA PLUS) w/Device KIT 1 each by Does not apply route 4 (four) times daily -  before meals and at bedtime. Patient not taking: Reported on 03/21/2022 09/19/19   Myrene Buddy, MD  diclofenac (VOLTAREN) 75 MG EC tablet Take 1 tablet (75 mg total) by mouth 2 (two) times daily. 07/24/23   Mardella Layman, MD  furosemide (LASIX) 40 MG tablet Take 40 mg by mouth daily. 06/28/23   [provider]  glucose blood test strip Use to check blood sugars 3 times a day. Patient not taking: Reported on 03/21/2022 12/13/18   Myrene Buddy, MD  HYDROcodone-acetaminophen (NORCO/VICODIN) 5-325 MG tablet Take 1 tablet by mouth at bedtime as needed for moderate pain (pain score 4-6) or severe pain (pain score 7-10). 07/24/23   Mardella Layman, MD  insulin glargine-yfgn (SEMGLEE) 100 UNIT/ML Pen Inject 46 Units into  the skin daily. 02/01/23   Cora Collum, DO  Insulin Pen Needle (PEN NEEDLES) 32G X 4 MM MISC 1 each by Does not apply route daily. 09/12/22   Sabino Dick, DO  Lancet Devices (MICROLET NEXT LANCING DEVICE) MISC 1 each by Does not apply route 3 (three) times daily. Patient not taking: Reported on 03/21/2022 12/13/18   Myrene Buddy, MD  multivitamin (ONE-A-DAY MEN'S) TABS tablet Take 1 tablet daily by mouth.    [provider]  mycophenolate (CELLCEPT) 250 MG capsule Take 500 mg by mouth 2 (two) times daily.     [provider]  predniSONE (DELTASONE) 20 MG tablet Take 2 tablets (40 mg total) by mouth daily. 07/24/23   Mardella Layman, MD  rosuvastatin (CRESTOR) 10 MG tablet Take one tablet every Monday, Wednesday, and Friday. 01/16/23   Sabino Dick, DO  sildenafil (VIAGRA) 100 MG tablet Take 100 mg by mouth daily as needed. Patient not taking: Reported on 04/18/2022 03/23/21   [provider]  spironolactone (ALDACTONE) 25 MG tablet Take 25 mg by mouth at bedtime. 05/13/23   [provider]  tacrolimus (PROGRAF) 1 MG capsule Take 1 capsule (1 mg total) by mouth 2 (two) times daily. 10/08/13   Glori Luis, MD  atorvastatin (LIPITOR) 40 MG tablet Take 1 tablet (40 mg total) by mouth daily. Patient not taking: Reported on 07/09/2017 07/13/16 05/09/19  Palma Holter, MD  Past Medical History:  Diagnosis Date   Diabetes mellitus    H/O acute post-streptococcal glomerulonephritis 1997   History of renal transplant    Hyperlipidemia    Hypertension    Onychomycosis of toenail    Renal disorder    Tachycardia    Past Surgical History:  Procedure Laterality Date   ANKLE SURGERY     AV FISTULA PLACEMENT  1997   Removed after transplant in 2006   KIDNEY SURGERY  2006   KIDNEY TRANSPLANT       Allergies    Atorvastatin    Review of Systems   Review of Systems  Physical Exam Updated Vital Signs BP (!) 158/88   Pulse 97    Temp 98 F (36.7 C) (Oral)   Resp 17   Ht 5\' 6"  (1.676 m)   Wt 73.7 kg   SpO2 100%   BMI 26.22 kg/m  Physical Exam Vitals and nursing note reviewed.  HENT:     Head: Normocephalic.  Cardiovascular:     Rate and Rhythm: Regular rhythm.  Pulmonary:     Breath sounds: No wheezing.  Abdominal:     Tenderness: There is no abdominal tenderness.  Skin:    Capillary Refill: Capillary refill takes less than 2 seconds.  Neurological:     Mental Status: He is alert.     ED Results / Procedures / Treatments   Labs (all labs ordered are listed, but only abnormal results are displayed) Labs Reviewed  CBC WITH DIFFERENTIAL/PLATELET - Abnormal; Notable for the following components:      Result Value   WBC 13.2 (*)    RBC 2.89 (*)    Hemoglobin 7.1 (*)    HCT 20.5 (*)    MCV 70.9 (*)    MCH 24.6 (*)    Neutro Abs 10.2 (*)    Monocytes Absolute 1.4 (*)    Abs Immature Granulocytes 0.10 (*)    All other components within normal limits  COMPREHENSIVE METABOLIC PANEL - Abnormal; Notable for the following components:   Potassium 2.8 (*)    CO2 14 (*)    Glucose, Bld 102 (*)    BUN 172 (*)    Creatinine, Ser 26.24 (*)    Calcium 5.4 (*)    Total Protein 6.2 (*)    Albumin 3.2 (*)    GFR, Estimated 2 (*)    Anion gap 23 (*)    All other components within normal limits  LIPASE, BLOOD - Abnormal; Notable for the following components:   Lipase 65 (*)    All other components within normal limits  PROTIME-INR - Abnormal; Notable for the following components:   Prothrombin Time 15.8 (*)    All other components within normal limits  I-STAT CG4 LACTIC ACID, ED - Abnormal; Notable for the following components:   Lactic Acid, Venous 0.3 (*)    All other components within normal limits  RESP PANEL BY RT-PCR (RSV, FLU A&B, COVID)  RVPGX2  CULTURE, BLOOD (ROUTINE X 2)  CULTURE, BLOOD (ROUTINE X 2)  APTT  URINALYSIS, W/ REFLEX TO CULTURE (INFECTION SUSPECTED)  MYCOPHENOLIC ACID  (CELLCEPT)  TACROLIMUS LEVEL  I-STAT CG4 LACTIC ACID, ED    EKG None  Radiology DG Chest 1 View  Result Date: 07/31/2023 CLINICAL DATA:  Fatigue.  Body aches. EXAM: CHEST  1 VIEW COMPARISON:  April 12, 2016. FINDINGS: The heart size and mediastinal contours are within normal limits. Both lungs are clear. The visualized skeletal structures are  unremarkable. IMPRESSION: No active disease. Electronically Signed   By: Lupita Raider M.D.   On: 07/31/2023 19:21    Procedures Procedures    Medications Ordered in ED Medications  ondansetron (ZOFRAN) injection 4 mg (4 mg Intravenous Given 07/31/23 1644)  lactated ringers infusion ( Intravenous New Bag/Given 07/31/23 1940)  potassium chloride 10 mEq in 100 mL IVPB (10 mEq Intravenous New Bag/Given 07/31/23 1955)  calcium gluconate 1 g/ 50 mL sodium chloride IVPB (1,000 mg Intravenous New Bag/Given 07/31/23 2042)  potassium chloride 10 mEq in 100 mL IVPB (has no administration in time range)  lactated ringers bolus 1,000 mL (1,000 mLs Intravenous New Bag/Given 07/31/23 1941)    ED Course/ Medical Decision Making/ A&P                                 Medical Decision Making Risk Prescription drug management.   Patient with nausea, diarrhea.  Has creatinine now at 24.  And BUN to the 170.  Bicarb of 14.  I was able discussed with Dr. Malen Gauze from nephrology.  Had creatinine of 17 in November and in September had 1 of 8.  Has had worsening kidney function.  Will require admission to hospital.  Will give 2 runs of potassium.  Will give some calcium.  Will likely require dialysis but will need access.  Benign abdominal exam.  CRITICAL CARE Performed by: Benjiman Core Total critical care time: 30 minutes Critical care time was exclusive of separately billable procedures and treating other patients. Critical care was necessary to treat or prevent imminent or life-threatening deterioration. Critical care was time spent personally by me  on the following activities: development of treatment plan with patient and/or surrogate as well as nursing, discussions with consultants, evaluation of patient's response to treatment, examination of patient, obtaining history from patient or surrogate, ordering and performing treatments and interventions, ordering and review of laboratory studies, ordering and review of radiographic studies, pulse oximetry and re-evaluation of patient's condition.  Do not think the patient is septic at this time.  Likely does have some volume depletion.  Discussed with family medicine, who will admit.         Final Clinical Impression(s) / ED Diagnoses Final diagnoses:  AKI (acute kidney injury) (HCC)  Nausea vomiting and diarrhea  Renal transplant recipient  Hypocalcemia  Hypokalemia    Rx / DC Orders ED Discharge Orders     None         Benjiman Core, MD 07/31/23 2044

## 2023-07-31 NOTE — Hospital Course (Addendum)
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 His hospital course is detailed below:  AKI on CKD - progression to ESRD in the setting of failed transplant As noted above, the patient had a kidney transplant performed in 2006.  He has had progressively worsening kidney function over the last several months, follows with [facility].  In the ED, creatinine was 26.54 with a BUN of 172.  Nephrology was consulted and recommended initiating dialysis while inpatient.  He had a right internal jugular catheter placed with IR, which was then replaced with tunneled cath prior to discharge. Patient tolerated HD well and was set up by renal navigator with outpatient HD. Patient will need to f/u with vascular ***  Patient was anemic on admission and required 1u pRBC transfusion during his stay. Started on ESA, calcium acetate as well.  T2DM: ***  Nausea Vomiting Assumed viral insult causing constellation of symptoms.  Patient had improvement of symptoms during stay and required gentle fluids at the beginning of his admission.   Other chronic conditions were medically managed with home medications and formulary alternatives as necessary   PCP Follow-up Recommendations: [facility] as PCP--Please message Eniola about this and change to her when patient arrives at [facility] Restart long acting insulin outpatient - held on discharge*** Ensure nephro f/u - set up for HD starting 12/10 at [facility]. Will also need titration of post transplant meds with nephro

## 2023-08-01 ENCOUNTER — Inpatient Hospital Stay (HOSPITAL_COMMUNITY): Payer: BC Managed Care – PPO

## 2023-08-01 DIAGNOSIS — N186 End stage renal disease: Secondary | ICD-10-CM

## 2023-08-01 DIAGNOSIS — D72829 Elevated white blood cell count, unspecified: Secondary | ICD-10-CM

## 2023-08-01 HISTORY — PX: IR US GUIDE VASC ACCESS RIGHT: IMG2390

## 2023-08-01 HISTORY — PX: IR FLUORO GUIDE CV LINE RIGHT: IMG2283

## 2023-08-01 LAB — RENAL FUNCTION PANEL
Albumin: 2.8 g/dL — ABNORMAL LOW (ref 3.5–5.0)
Albumin: 2.7 g/dL — ABNORMAL LOW (ref 3.5–5.0)
Anion gap: 20 — ABNORMAL HIGH (ref 5–15)
Anion gap: 16 — ABNORMAL HIGH (ref 5–15)
BUN: 171 mg/dL — ABNORMAL HIGH (ref 6–20)
BUN: 169 mg/dL — ABNORMAL HIGH (ref 6–20)
CO2: 14 mmol/L — ABNORMAL LOW (ref 22–32)
CO2: 15 mmol/L — ABNORMAL LOW (ref 22–32)
Calcium: 5.7 mg/dL — CL (ref 8.9–10.3)
Calcium: 5.6 mg/dL — CL (ref 8.9–10.3)
Chloride: 102 mmol/L (ref 98–111)
Chloride: 102 mmol/L (ref 98–111)
Creatinine, Ser: 24.5 mg/dL — ABNORMAL HIGH (ref 0.61–1.24)
Creatinine, Ser: 24.5 mg/dL — ABNORMAL HIGH (ref 0.61–1.24)
GFR, Estimated: 2 mL/min — ABNORMAL LOW (ref >60)
GFR, Estimated: 2 mL/min — ABNORMAL LOW (ref >60)
Glucose, Bld: 41 mg/dL — CL (ref 70–99)
Glucose, Bld: 96 mg/dL (ref 70–99)
Phosphorus: 9 mg/dL — ABNORMAL HIGH (ref 2.5–4.6)
Phosphorus: 8.7 mg/dL — ABNORMAL HIGH (ref 2.5–4.6)
Potassium: 2.6 mmol/L — CL (ref 3.5–5.1)
Potassium: 3.2 mmol/L — ABNORMAL LOW (ref 3.5–5.1)
Sodium: 136 mmol/L (ref 135–145)
Sodium: 133 mmol/L — ABNORMAL LOW (ref 135–145)

## 2023-08-01 LAB — GLUCOSE, CAPILLARY
Glucose-Capillary: 38 mg/dL — CL (ref 70–99)
Glucose-Capillary: 116 mg/dL — ABNORMAL HIGH (ref 70–99)
Glucose-Capillary: 38 mg/dL — CL (ref 70–99)
Glucose-Capillary: 101 mg/dL — ABNORMAL HIGH (ref 70–99)

## 2023-08-01 LAB — IRON AND TIBC
Iron: 126 ug/dL (ref 45–182)
Saturation Ratios: 70 % — ABNORMAL HIGH (ref 17.9–39.5)
TIBC: 179 ug/dL — ABNORMAL LOW (ref 250–450)
UIBC: 53 ug/dL

## 2023-08-01 LAB — HEMOGLOBIN A1C
Hgb A1c MFr Bld: 6.8 % — ABNORMAL HIGH (ref 4.8–5.6)
Mean Plasma Glucose: 148.46 mg/dL

## 2023-08-01 LAB — HEMOGLOBIN AND HEMATOCRIT, BLOOD
HCT: 19.3 % — ABNORMAL LOW (ref 39.0–52.0)
HCT: 23.5 % — ABNORMAL LOW (ref 39.0–52.0)
Hemoglobin: 6.3 g/dL — CL (ref 13.0–17.0)
Hemoglobin: 8 g/dL — ABNORMAL LOW (ref 13.0–17.0)

## 2023-08-01 LAB — CBC
HCT: 19.3 % — ABNORMAL LOW (ref 39.0–52.0)
Hemoglobin: 6.5 g/dL — CL (ref 13.0–17.0)
MCH: 23.7 pg — ABNORMAL LOW (ref 26.0–34.0)
MCHC: 33.7 g/dL (ref 30.0–36.0)
MCV: 70.4 fL — ABNORMAL LOW (ref 80.0–100.0)
Platelets: 191 10*3/uL (ref 150–400)
RBC: 2.74 MIL/uL — ABNORMAL LOW (ref 4.22–5.81)
RDW: 14.2 % (ref 11.5–15.5)
WBC: 14 10*3/uL — ABNORMAL HIGH (ref 4.0–10.5)
nRBC: 0 % (ref 0.0–0.2)

## 2023-08-01 LAB — PREPARE RBC (CROSSMATCH)

## 2023-08-01 LAB — CBG MONITORING, ED
Glucose-Capillary: 154 mg/dL — ABNORMAL HIGH (ref 70–99)
Glucose-Capillary: 95 mg/dL (ref 70–99)

## 2023-08-01 LAB — ABO/RH: ABO/RH(D): A POS

## 2023-08-01 LAB — HEPATITIS B SURFACE ANTIGEN: Hepatitis B Surface Ag: NONREACTIVE

## 2023-08-01 LAB — MAGNESIUM: Magnesium: 1.7 mg/dL (ref 1.7–2.4)

## 2023-08-01 LAB — MRSA NEXT GEN BY PCR, NASAL: MRSA by PCR Next Gen: NOT DETECTED

## 2023-08-01 LAB — HIV ANTIBODY (ROUTINE TESTING W REFLEX): HIV Screen 4th Generation wRfx: NONREACTIVE

## 2023-08-01 LAB — FERRITIN: Ferritin: 1307 ng/mL — ABNORMAL HIGH (ref 24–336)

## 2023-08-01 MED ORDER — HEPARIN SODIUM (PORCINE) 1000 UNIT/ML DIALYSIS
1000.0000 [IU] | INTRAMUSCULAR | Status: DC | PRN
  Administered 2023-08-02: 2400 [IU]
  Filled 2023-08-01 (×2): qty 1

## 2023-08-01 MED ORDER — ANTICOAGULANT SODIUM CITRATE 4% (200MG/5ML) IV SOLN: 5.0000 mL | Status: DC | PRN

## 2023-08-01 MED ORDER — GLUCOSE 40 % PO GEL
2.0000 | ORAL | Status: AC
  Administered 2023-08-01: 37.5 g via ORAL

## 2023-08-01 MED ORDER — HEPARIN SODIUM (PORCINE) 5000 UNIT/ML IJ SOLN
5000.0000 [IU] | Freq: Three times a day (TID) | INTRAMUSCULAR | Status: AC
Start: 2023-08-01 — End: 2023-08-06
  Administered 2023-08-02 – 2023-08-05 (×9): 5000 [IU] via SUBCUTANEOUS
  Filled 2023-08-01 (×10): qty 1

## 2023-08-01 MED ORDER — LIDOCAINE-PRILOCAINE 2.5-2.5 % EX CREA: 1.0000 | TOPICAL_CREAM | CUTANEOUS | Status: DC | PRN

## 2023-08-01 MED ORDER — PROCHLORPERAZINE MALEATE 5 MG PO TABS: 5.0000 mg | ORAL_TABLET | Freq: Four times a day (QID) | ORAL | Status: DC | PRN

## 2023-08-01 MED ORDER — DEXTROSE 50 % IV SOLN
50.0000 mL | Freq: Once | INTRAVENOUS | Status: AC
  Administered 2023-08-01: 50 mL via INTRAVENOUS
  Filled 2023-08-01: qty 50

## 2023-08-01 MED ORDER — DEXTROSE 50 % IV SOLN
INTRAVENOUS | Status: AC
  Filled 2023-08-01: qty 50

## 2023-08-01 MED ORDER — CALCIUM ACETATE (PHOS BINDER) 667 MG PO CAPS
667.0000 mg | ORAL_CAPSULE | Freq: Three times a day (TID) | ORAL | Status: DC
  Administered 2023-08-01: 667 mg via ORAL
  Filled 2023-08-01: qty 1

## 2023-08-01 MED ORDER — LIDOCAINE-EPINEPHRINE 1 %-1:100000 IJ SOLN
20.0000 mL | Freq: Once | INTRAMUSCULAR | Status: AC
  Administered 2023-08-01: 10 mL

## 2023-08-01 MED ORDER — CALCIUM GLUCONATE-NACL 1-0.675 GM/50ML-% IV SOLN
1.0000 g | Freq: Once | INTRAVENOUS | Status: AC
  Administered 2023-08-01: 1000 mg via INTRAVENOUS
  Filled 2023-08-01: qty 50

## 2023-08-01 MED ORDER — HEPARIN SODIUM (PORCINE) 1000 UNIT/ML IJ SOLN
INTRAMUSCULAR | Status: AC
  Administered 2023-08-01: 1000 [IU]
  Filled 2023-08-01: qty 3

## 2023-08-01 MED ORDER — AMLODIPINE BESYLATE 10 MG PO TABS
10.0000 mg | ORAL_TABLET | Freq: Every day | ORAL | Status: DC
  Administered 2023-08-01 – 2023-08-07 (×7): 10 mg via ORAL
  Filled 2023-08-01 (×6): qty 1
  Filled 2023-08-01: qty 2

## 2023-08-01 MED ORDER — CALCIUM ACETATE (PHOS BINDER) 667 MG PO CAPS
1334.0000 mg | ORAL_CAPSULE | Freq: Three times a day (TID) | ORAL | Status: DC
  Administered 2023-08-02 – 2023-08-07 (×13): 1334 mg via ORAL
  Filled 2023-08-01 (×18): qty 2

## 2023-08-01 MED ORDER — PENTAFLUOROPROP-TETRAFLUOROETH EX AERO: 1.0000 | INHALATION_SPRAY | CUTANEOUS | Status: DC | PRN

## 2023-08-01 MED ORDER — POTASSIUM CHLORIDE 10 MEQ/100ML IV SOLN
10.0000 meq | INTRAVENOUS | Status: AC
  Administered 2023-08-01: 10 meq via INTRAVENOUS
  Filled 2023-08-01: qty 100

## 2023-08-01 MED ORDER — POTASSIUM CHLORIDE 10 MEQ/100ML IV SOLN
10.0000 meq | INTRAVENOUS | Status: AC
  Administered 2023-08-01 (×2): 10 meq via INTRAVENOUS
  Filled 2023-08-01 (×2): qty 100

## 2023-08-01 MED ORDER — GLUCOSE 40 % PO GEL: 2.0000 | ORAL | Status: AC

## 2023-08-01 MED ORDER — ALTEPLASE 2 MG IJ SOLR: 2.0000 mg | Freq: Once | INTRAMUSCULAR | Status: DC | PRN

## 2023-08-01 MED ORDER — CALCIUM GLUCONATE-NACL 2-0.675 GM/100ML-% IV SOLN
2.0000 g | Freq: Once | INTRAVENOUS | Status: AC
  Administered 2023-08-01: 2000 mg via INTRAVENOUS
  Filled 2023-08-01: qty 100

## 2023-08-01 MED ORDER — LIDOCAINE HCL (PF) 1 % IJ SOLN: 5.0000 mL | INTRAMUSCULAR | Status: DC | PRN

## 2023-08-01 MED ORDER — PROCHLORPERAZINE EDISYLATE 10 MG/2ML IJ SOLN
5.0000 mg | Freq: Four times a day (QID) | INTRAMUSCULAR | Status: DC | PRN
  Administered 2023-08-01: 5 mg via INTRAVENOUS
  Filled 2023-08-01: qty 1
  Filled 2023-08-01: qty 2

## 2023-08-01 MED ORDER — LIDOCAINE-EPINEPHRINE 1 %-1:100000 IJ SOLN
INTRAMUSCULAR | Status: AC
  Filled 2023-08-01: qty 1

## 2023-08-01 MED ORDER — SODIUM CHLORIDE 0.9% IV SOLUTION: Freq: Once | INTRAVENOUS | Status: DC

## 2023-08-01 MED ORDER — HEPARIN SODIUM (PORCINE) 1000 UNIT/ML IJ SOLN
INTRAMUSCULAR | Status: AC
  Filled 2023-08-01: qty 10

## 2023-08-01 MED ORDER — HEPARIN SODIUM (PORCINE) 1000 UNIT/ML IJ SOLN
4000.0000 [IU] | Freq: Once | INTRAMUSCULAR | Status: AC
  Administered 2023-08-01: 2.4 mL
  Filled 2023-08-01: qty 4

## 2023-08-01 MED ORDER — DEXTROSE 50 % IV SOLN
25.0000 g | INTRAVENOUS | Status: AC
  Administered 2023-08-01: 25 g via INTRAVENOUS

## 2023-08-01 MED ORDER — INSULIN ASPART 100 UNIT/ML IJ SOLN: 0.0000 [IU] | Freq: Three times a day (TID) | INTRAMUSCULAR | Status: DC

## 2023-08-01 MED ORDER — HEPARIN SODIUM (PORCINE) 5000 UNIT/ML IJ SOLN: 5000.0000 [IU] | Freq: Three times a day (TID) | INTRAMUSCULAR | Status: DC

## 2023-08-01 NOTE — ED Notes (Signed)
Pt transported to vascular.  °

## 2023-08-01 NOTE — Progress Notes (Signed)
   08/01/23 1716  Vitals  Temp 98.2 F (36.8 C)  Pulse Rate (!) 102  Resp 18  BP (!) 160/91  SpO2 100 %  O2 Device Room Air  Post Treatment  Dialyzer Clearance Clear  Hemodialysis Intake (mL) 0 mL  Liters Processed 24  Fluid Removed (mL) 0 mL  Tolerated HD Treatment Yes  Post-Hemodialysis Comments Tx. Completed without difficulties. UF goal met per order. Report given to Bedside 2C   Received patient in bed to unit.  Alert and oriented.  Informed consent signed and in chart.   TX duration: 2  Patient tolerated well.  Transported back to the room  Alert, without acute distress.  Hand-off given to patient's nurse.   Access used: Yes Access issues: No  Total UF removed: 0 Medication(s) given: See MAR Post HD VS: See Above Grid Post HD weight: No Weight Bed   [facility]

## 2023-08-01 NOTE — Progress Notes (Signed)
Daily Progress Note Intern Pager: [phone]  Patient name: Adam Davis Medical record number: [id number] Date of birth: 1976/05/20 Age: 47 y.o. Gender: male  Primary Care Provider: Annie Sable, MD Consultants: Nephrology  Code Status: Full  Pt Overview and Major Events to Date:  12/3: admitted   Assessment and Plan: Mr. Lara is a 47 year old male who presented with nausea vomiting and diarrhea found to have acute on chronic kidney failure.  Patient is status post kidney transplant 2006.  Nephrology was consulted who recommended dialysis.  Patient found to have multiple electrolyte abnormalities including hypocalcemia and hypokalemia - both of which were repleted on admission. We will start patient on oral calcium supplementation    Patient's hemoglobin also found to be 6.5.  Per nephrology note, last hemoglobin was 7.3 on office visit 11/19.  Will repeat CBC, consider RBC transfusion if hemoglobin <  7. Will restart hypertensive medication per nephrology recs. As patient is eating, will discontinue IVF. Patient QT interval prolonged. Will repeat EKG. Patient has history of DM, will start very sensitive scale insulin.  Assessment & Plan ESRD (end stage renal disease) (HCC) Patient is sp renal transplant 18 years ago. Worsening uremic symptoms prompting dialysis. Nephrology on board, appreciate recs. Catheter placed by IR this AM. Patient with prolonged QT, likely 2/2 to electrolyte abnormalities.  - plan for dialysis today  - nephrology following, appreciate recs  - patient with multiple electrolyte abnormalities (hypocaelcemia, hypokalemia, hyperphosphatemia). Replaced patients calcium and potassium. Will start delay calcium acetate TID for phosphorus binding. - supplement electrolytes as needed  - immunosuppressive regimen with slow taper per nephrology  - RFP - Repeat EKG  - will d/c fluids  - CCM - avoid nephrotoxic medications  Anemia due to stage 5 chronic  kidney disease, 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) Likely chronic 2/2 to anemia of chronic disease. Hg 6.5. Will repeat with H&H and consider RBC transfusion if <7.  - H&H pending - type and screen updated - Aranesp per nephro  Nausea vomiting and diarrhea Patient is much improved from admission. Would like to eat. Will switch zofran to compazine given prolonged QT on EKG. - compazine  Leukocytosis, unspecified type Patient with WBC 14. May be acute phase reactant. Given patient is immunocompromised, will continue to monitor for signs of infection. - vital signs per unit - AM CBC with diff    Chronic and Stable Problems:  DMII: very sensitive sliding scale, CBGs monitor  HTN: continue norvasc 10mg , hold lasix and spironolactone   FEN/GI: renal diet with 1200 mL fluid restriction  PPx: subcutaneous heparin Dispo: Pending clinical improvement and dialysis. Patient will likely need outpatient dialysis set up prior to discharge.     Subjective:  Patient seen in ED. Patient states he is feeling okay. Has some pain from catheter insertion but is otherwise okay. Would like some crackers and juice. Denies nausea at this time.   Objective: Temp:  [98 F (36.7 C)-99 F (37.2 C)] 99 F (37.2 C) (12/04 1124) Pulse Rate:  [92-105] 100 (12/04 1141) Resp:  [10-21] 10 (12/04 1141) BP: (144-160)/(74-94) 154/91 (12/04 1141) SpO2:  [97 %-100 %] 100 % (12/04 1141) Weight:  [73.7 kg] 73.7 kg (12/03 1611) Physical Exam: General: chronically ill appearing,  Cardiovascular: RRR, no m/rg Respiratory: CTAB, NWOB on RA Abdomen: flat, soft, NTND Extremities: no LE edema.  Skin: right sided catheter placed at right internal jugular   Laboratory: Most recent CBC Lab Results  Component Value Date  WBC 14.0 (H) 08/01/2023   HGB 6.3 (LL) 08/01/2023   HCT 19.3 (L) 08/01/2023   MCV 70.4 (L) 08/01/2023   PLT 191 08/01/2023   Most recent BMP    Latest Ref Rng & Units 08/01/2023    3:52 AM  BMP   Glucose 70 - 99 mg/dL 41   BUN 6 - 20 mg/dL 161   Creatinine 0.96 - 1.24 mg/dL 04.54   Sodium 098 - 119 mmol/L 136   Potassium 3.5 - 5.1 mmol/L 2.6   Chloride 98 - 111 mmol/L 102   CO2 22 - 32 mmol/L 14   Calcium 8.9 - 10.3 mg/dL 5.7     Other pertinent labs  Hg A1C: 6.8 Mag: 1.7 PTH: pending Hep B antibody: pending  Iron 126 ug/dL     TIBC 147 Low  ug/dL    Saturation Ratios 70 High  %    UIBC 53      Imaging/Diagnostic Tests:  Chest xray:  No active disease.   US renal ultrasound: Left lower quadrant renal transplant. 2. Nonspecific increased parenchymal echotexture. 3. Resistive index in the lower pole measures 0.84, equivocal. 4. Normal resistive index in the upper pole. 5. No mass or hydronephrosis.  Georg Ruddle Joleah Kosak, MD 08/01/2023, 11:47 AM  PGY-1, [facility] FPTS Intern pager: [phone], text pages welcome Secure chat group [facility] Teaching Service

## 2023-08-01 NOTE — ED Notes (Signed)
Report given to Dialysis.  WIll get  him a little later

## 2023-08-01 NOTE — Plan of Care (Signed)
Discussed risks, benefits and indications for blood transfusion.  Patient agreeable to transfusion of 1 unit of blood after he completes dialysis today.  Per patient's request, will update wife on care plan.  Elberta Fortis, DO PGY-2, [facility] family medicine residency

## 2023-08-01 NOTE — Progress Notes (Signed)
Patient ID: Adam Davis, male   DOB: [birth year], 47 y.o.   MRN: [id number] [facility] Progress Note   Assessment/ Plan:   1.  Anorexia/dysgeusia: Secondary to uremia from progressive renal decline now to a point of end-stage renal disease from dysfunction of his renal allograft. 2. ESRD: With failed deceased donor renal transplant that was functional for the past 18 years.  Now with worsening uremic symptoms prompting initiation of dialysis.  I appreciate radiology placing his right IJ temporary dialysis catheter and noted the plan for conversion to a tunneled catheter after leukocytosis has resolved.  I will begin the process for outpatient dialysis unit placement.  Continue current immunosuppressive regimen for renal allograft with slow taper. 3. Anemia: Likely secondary to chronic disease including chronic kidney disease.  Iron stores appear to be adequate, begin ESA. 4. CKD-MBD: Significant hyperphosphatemia noted with low calcium level.  Begin calcium acetate 3 times a day for phosphorus binding. 5.  Hypokalemia: Will supplement via oral route and perform dialysis with 4K bath. 6. Hypertension: Elevated blood pressure noted, resume oral antihypertensive therapy and monitor with hemodialysis.  He appears to be euvolemic on exam.  Subjective:   Reports to have had a restless night in the emergency room.  Had right IJ dialysis catheter by IR earlier today.   Objective:   BP (!) 158/94   Pulse (!) 103   Temp 98.2 F (36.8 C)   Resp 16   Ht 5\' 6"  (1.676 m)   Wt 73.7 kg   SpO2 100%   BMI 26.22 kg/m   Physical Exam: Gen: Appears comfortable resting in gurney CVS: Pulse regular rhythm, tachycardia, S1 and S2 with ejection systolic murmur Resp: Clear to auscultation, no rales/rhonchi Abd: Soft, flat, nontender, bowel sounds normal Ext: No lower extremity edema Access: Right IJ temporary dialysis catheter  Labs: BMET Recent Labs  Lab 07/31/23 1632 08/01/23 0352   NA 135 136  K 2.8* 2.6*  CL 98 102  CO2 14* 14*  GLUCOSE 102* 41*  BUN 172* 171*  CREATININE 26.24* 24.50*  CALCIUM 5.4* 5.7*  PHOS  --  9.0*   CBC Recent Labs  Lab 07/31/23 1632 08/01/23 0352  WBC 13.2* 14.0*  NEUTROABS 10.2*  --   HGB 7.1* 6.5*  HCT 20.5* 19.3*  MCV 70.9* 70.4*  PLT 209 191      Medications:     Chlorhexidine Gluconate Cloth  6 each Topical Q0600   darbepoetin (ARANESP) injection - NON-DIALYSIS  40 mcg Subcutaneous Q Wed-1800   mycophenolate  250 mg Oral BID   tacrolimus  1 mg Oral BID   Zetta Bills, MD 08/01/2023, 10:42 AM

## 2023-08-01 NOTE — Assessment & Plan Note (Signed)
Patient is sp renal transplant 18 years ago. Worsening uremic symptoms prompting dialysis. Nephrology on board, appreciate recs. Catheter placed by IR this AM. Patient with prolonged QT, likely 2/2 to electrolyte abnormalities.  - plan for dialysis today  - nephrology following, appreciate recs  - patient with multiple electrolyte abnormalities (hypocaelcemia, hypokalemia, hyperphosphatemia). Replaced patients calcium and potassium. Will start delay calcium acetate TID for phosphorus binding. - supplement electrolytes as needed  - immunosuppressive regimen with slow taper per nephrology  - RFP - Repeat EKG  - will d/c fluids  - CCM - avoid nephrotoxic medications

## 2023-08-01 NOTE — ED Notes (Signed)
Pt transported back to ED.

## 2023-08-01 NOTE — ED Notes (Signed)
Pt transported to dialysis

## 2023-08-01 NOTE — ED Notes (Addendum)
Pt requesting for Provider to discuss blood transfusion with pt prior to pt receiving blood transfusion. Benedetto Goad, MD notified.

## 2023-08-01 NOTE — Procedures (Signed)
Interventional Radiology Procedure Note  Procedure: Temporary hemodialysis catheter placement  Findings: Please refer to procedural dictation for full description. Right internal jugular, 15 cm Trialysis.  Complications: None immediate  Estimated Blood Loss: < 5 mL  Recommendations: Catheter ready for immediate use.   Marliss Coots, MD

## 2023-08-01 NOTE — ED Notes (Signed)
Date and time results received: 08/01/23 1155 (use smartphrase ".now" to insert current time)  Test: calcium  Critical Value: 5.6  Name of Provider Notified: Elberta Fortis, MD  Orders Received? Or Actions Taken?:  notified

## 2023-08-01 NOTE — Assessment & Plan Note (Signed)
Likely chronic 2/2 to anemia of chronic disease. Hg 6.5. Will repeat with H&H and consider RBC transfusion if <7.  - H&H pending - type and screen updated - Aranesp per nephro

## 2023-08-01 NOTE — Plan of Care (Addendum)
Called to bedside by nursing for patient experiencing flank pain as well as a low lab value with a hemoglobin of 6.5.  On evaluation the patient had been able to readjust in bed and their flank pain had improved.  When they were experiencing it it was bilateral, cramping and he rated it as an 8 out of 10.  Patient's labs also came back while we were at bedside with a potassium of 2.6, glucose of 41 and calcium of 5.7.  Potassium x 2 and calcium repletion were ordered, as well as a D50 amp.  We will repeat CBGs now and at 0615 to make sure the patient's blood glucose responds appropriately.  Repeat BMP and CBC have been ordered for 0800 to recheck after interventions.  Discussed with patient that he would likely need a blood transfusion at this point which could be done either separately from hemodialysis or as part of hemodialysis.  Patient consented after discussion.  We will reach out to nephrology for their input on timing of transfusion.

## 2023-08-01 NOTE — Assessment & Plan Note (Addendum)
Patient with WBC 14. May be acute phase reactant. Given patient is immunocompromised, will continue to monitor for signs of infection. - vital signs per unit - AM CBC with diff

## 2023-08-01 NOTE — Progress Notes (Addendum)
   IR Note  Request made for tunneled dialysis catheter for this pt per Dr Malen Gauze  # CKD stage V with progression to ESRD  - Progressive CKD with uremic symptoms - NPO after midnight for access placement for HD initiation  - Note that the ER gave him LR 1 liter and started a rate of 150 ml/hr of LR.  Will reduce LR to 75 ml/hr x 12 hours.  Will then need to reassess fluids - HD tomorrow after dialysis access placement - Consult IR for tunneled catheter placement  - Will obtain vein mapping   - Please avoid NSAID's in this patient - he is listed as being on diclofenac and would not resume - I have ordered a renal transplant ultrasound with duplex for completion - Will need to reach out to HD SW to [facility] the patient to an outpatient unit.  May be interested in home therapy.     Wbc 14 this am Hg 6.5 Afeb  IR will place non tunneled catheter this am- secondary wbc 14 Can convert to tunneled cath when wbc wnl  Pt is aware and agreeable Plan for non tunn dialysis catheter placement today

## 2023-08-02 DIAGNOSIS — Z992 Dependence on renal dialysis: Secondary | ICD-10-CM | POA: Diagnosis not present

## 2023-08-02 DIAGNOSIS — T8612 Kidney transplant failure: Secondary | ICD-10-CM | POA: Diagnosis not present

## 2023-08-02 DIAGNOSIS — T82898A Other specified complication of vascular prosthetic devices, implants and grafts, initial encounter: Secondary | ICD-10-CM | POA: Diagnosis not present

## 2023-08-02 DIAGNOSIS — N186 End stage renal disease: Secondary | ICD-10-CM | POA: Diagnosis not present

## 2023-08-02 LAB — GLUCOSE, CAPILLARY
Glucose-Capillary: 116 mg/dL — ABNORMAL HIGH (ref 70–99)
Glucose-Capillary: 133 mg/dL — ABNORMAL HIGH (ref 70–99)
Glucose-Capillary: 136 mg/dL — ABNORMAL HIGH (ref 70–99)
Glucose-Capillary: 191 mg/dL — ABNORMAL HIGH (ref 70–99)
Glucose-Capillary: 244 mg/dL — ABNORMAL HIGH (ref 70–99)

## 2023-08-02 LAB — RENAL FUNCTION PANEL
Albumin: 2.6 g/dL — ABNORMAL LOW (ref 3.5–5.0)
Anion gap: 15 (ref 5–15)
BUN: 109 mg/dL — ABNORMAL HIGH (ref 6–20)
CO2: 19 mmol/L — ABNORMAL LOW (ref 22–32)
Calcium: 5.8 mg/dL — CL (ref 8.9–10.3)
Chloride: 101 mmol/L (ref 98–111)
Creatinine, Ser: 18.21 mg/dL — ABNORMAL HIGH (ref 0.61–1.24)
GFR, Estimated: 3 mL/min — ABNORMAL LOW (ref >60)
Glucose, Bld: 112 mg/dL — ABNORMAL HIGH (ref 70–99)
Phosphorus: 5 mg/dL — ABNORMAL HIGH (ref 2.5–4.6)
Potassium: 2.8 mmol/L — ABNORMAL LOW (ref 3.5–5.1)
Sodium: 135 mmol/L (ref 135–145)

## 2023-08-02 LAB — CBC WITH DIFFERENTIAL/PLATELET
Abs Immature Granulocytes: 0.11 10*3/uL — ABNORMAL HIGH (ref 0.00–0.07)
Basophils Absolute: 0 10*3/uL (ref 0.0–0.1)
Basophils Relative: 0 %
Eosinophils Absolute: 0.1 10*3/uL (ref 0.0–0.5)
Eosinophils Relative: 1 %
HCT: 22 % — ABNORMAL LOW (ref 39.0–52.0)
Hemoglobin: 7.6 g/dL — ABNORMAL LOW (ref 13.0–17.0)
Immature Granulocytes: 1 %
Lymphocytes Relative: 10 %
Lymphs Abs: 1.2 10*3/uL (ref 0.7–4.0)
MCH: 25.1 pg — ABNORMAL LOW (ref 26.0–34.0)
MCHC: 34.5 g/dL (ref 30.0–36.0)
MCV: 72.6 fL — ABNORMAL LOW (ref 80.0–100.0)
Monocytes Absolute: 1.5 10*3/uL — ABNORMAL HIGH (ref 0.1–1.0)
Monocytes Relative: 12 %
Neutro Abs: 9.4 10*3/uL — ABNORMAL HIGH (ref 1.7–7.7)
Neutrophils Relative %: 76 %
Platelets: 174 10*3/uL (ref 150–400)
RBC: 3.03 MIL/uL — ABNORMAL LOW (ref 4.22–5.81)
RDW: 14.8 % (ref 11.5–15.5)
WBC: 12.4 10*3/uL — ABNORMAL HIGH (ref 4.0–10.5)
nRBC: 0 % (ref 0.0–0.2)

## 2023-08-02 LAB — TYPE AND SCREEN
ABO/RH(D): A POS
Antibody Screen: NEGATIVE
Unit division: 0

## 2023-08-02 LAB — BPAM RBC
Blood Product Expiration Date: 202412212359
ISSUE DATE / TIME: 202412041803
Unit Type and Rh: 6200

## 2023-08-02 LAB — PARATHYROID HORMONE, INTACT (NO CA): PTH: 222 pg/mL — ABNORMAL HIGH (ref 15–65)

## 2023-08-02 LAB — HEPATITIS B SURFACE ANTIBODY, QUANTITATIVE: Hep B S AB Quant (Post): 17.3 m[IU]/mL

## 2023-08-02 MED ORDER — POTASSIUM CHLORIDE CRYS ER 20 MEQ PO TBCR
30.0000 meq | EXTENDED_RELEASE_TABLET | Freq: Two times a day (BID) | ORAL | Status: AC
  Administered 2023-08-02 (×2): 30 meq via ORAL
  Filled 2023-08-02 (×2): qty 1

## 2023-08-02 MED ORDER — HEPARIN SODIUM (PORCINE) 1000 UNIT/ML DIALYSIS: 40.0000 [IU]/kg | INTRAMUSCULAR | Status: DC | PRN

## 2023-08-02 MED ORDER — HEPARIN SODIUM (PORCINE) 1000 UNIT/ML DIALYSIS
40.0000 [IU]/kg | INTRAMUSCULAR | Status: DC | PRN
  Administered 2023-08-02: 2700 [IU] via INTRAVENOUS_CENTRAL
  Filled 2023-08-02 (×2): qty 3

## 2023-08-02 NOTE — Plan of Care (Signed)
  Problem: Education: Goal: Ability to describe self-care measures that may prevent or decrease complications (Diabetes Survival Skills Education) will improve Outcome: Progressing   Problem: Coping: Goal: Ability to adjust to condition or change in health will improve Outcome: Progressing   Problem: Fluid Volume: Goal: Ability to maintain a balanced intake and output will improve Outcome: Progressing   Problem: Health Behavior/Discharge Planning: Goal: Ability to identify and utilize available resources and services will improve Outcome: Progressing Goal: Ability to manage health-related needs will improve Outcome: Progressing   Problem: Metabolic: Goal: Ability to maintain appropriate glucose levels will improve Outcome: Progressing   Problem: Skin Integrity: Goal: Risk for impaired skin integrity will decrease Outcome: Progressing   Problem: Education: Goal: Knowledge of General Education information will improve Description: Including pain rating scale, medication(s)/side effects and non-pharmacologic comfort measures Outcome: Progressing   Problem: Health Behavior/Discharge Planning: Goal: Ability to manage health-related needs will improve Outcome: Progressing   Problem: Clinical Measurements: Goal: Ability to maintain clinical measurements within normal limits will improve Outcome: Progressing Goal: Will remain free from infection Outcome: Progressing Goal: Diagnostic test results will improve Outcome: Progressing Goal: Respiratory complications will improve Outcome: Progressing Goal: Cardiovascular complication will be avoided Outcome: Progressing   Problem: Activity: Goal: Risk for activity intolerance will decrease Outcome: Progressing   Problem: Nutrition: Goal: Adequate nutrition will be maintained Outcome: Progressing   Problem: Elimination: Goal: Will not experience complications related to bowel motility Outcome: Progressing Goal: Will not  experience complications related to urinary retention Outcome: Progressing   Problem: Pain Management: Goal: General experience of comfort will improve Outcome: Progressing   Problem: Safety: Goal: Ability to remain free from injury will improve Outcome: Progressing

## 2023-08-02 NOTE — Progress Notes (Signed)
Requested to see pt for out-pt HD needs at d/c. Met with pt at bedside. Introduced self and explained role. Pt lives in [place] and has hx at [facility] when pt was on HD in the past. Pt prefers this clinic again if possible. Referral submitted to [facility] admissions today for review. Pt states he plans to drive self to HD but wife can assist if needed. Will assist as needed.   Olivia Canter Renal Navigator [phone]

## 2023-08-02 NOTE — Progress Notes (Signed)
Mobility Specialist Progress Note:   08/02/23 1000  Mobility  Activity Ambulated with assistance in hallway  Level of Assistance Contact guard assist, steadying assist  Assistive Device None  Distance Ambulated (ft) 400 ft  Activity Response Tolerated well  Mobility Referral Yes  $Mobility charge 1 Mobility  Mobility Specialist Start Time (ACUTE ONLY) 0945  Mobility Specialist Stop Time (ACUTE ONLY) 0957  Mobility Specialist Time Calculation (min) (ACUTE ONLY) 12 min    Pt received in bed, agreeable to mobility. Ambulated w/ slow and steady gait. No complaints throughout. Pt left on EOB with call bell and all needs met.  [name] Mobility Specialist Please contact via Special educational needs teacher or Rehab office at [phone]

## 2023-08-02 NOTE — Progress Notes (Signed)
Daily Progress Note Intern Pager: [phone]  Patient name: Adam Davis Medical record number: [id number] Date of birth: 16-Sep-1975 Age: 47 y.o. Gender: male  Primary Care Provider: Annie Sable, MD Consultants: Nephrology  Code Status: Full  Pt Overview and Major Events to Date:  12/3: admitted 12/4: dialysis session   Assessment and Plan: Adam Davis is a 47 year old male admitted for progression of CKD to ESRD. Patient is now s/p diaylsis x1 yesterday. Nephrology is on board who plan to dialyze him again today.  Initial hg 6.5 -> patient is s/p 1 unit RBC transfusion last night, repeat hg 7.6 this AM. Continue home amlodipine for BP.  Assessment & Plan ESRD (end stage renal disease) (HCC) S/p dialysis yesterday. Nephrology following, patient will likely need outpatient dialysis vs peritoneal dialysis.Patient with prolonged QT, likely 2/2 to electrolyte abnormalities.  - plan for dialysis today  - nephrology following, appreciate recs  - patient with multiple electrolyte abnormalities likely 2/2 to ESRD - supplement as needed per nephrology - immunosuppressive regimen with slow taper per nephrology  - RFP - Will repeat EKG after dialysis today  - CCM - avoid nephrotoxic medications  Anemia Hg was 6.5 on admission and patient is s/p one unit pRBC. Repeat hg 7.6.  - repeat CBC tomorrow AM - Aranesp per nephro - CTM   Leukocytosis Patient with WBC 14 on admission, now downtrending. No obvious signs of infection. Will ctm.  - vital signs per unit - AM CBC with diff    Chronic and Stable Problems:  DMII: patient with low/normal CBGs during this admission. Will d/c very sensitive sliding scale and continue to monitor CBGs  HTN: continue norvasc 10mg , hold lasix and spironolactone   FEN/GI: renal diet with 1200 mL fluid restriction  PPx: subcutaneous heparin Dispo: Pending clinical improvement and dialysis. Patient will likely need outpatient dialysis set up  prior to discharge.     Subjective:  Patient reports feeling much better today. He is worried about work with this dialysis but does not have any complaints at this time.    Objective: Temp:  [98 F (36.7 C)-100.1 F (37.8 C)] 99.4 F (37.4 C) (12/05 0444) Pulse Rate:  [100-107] 106 (12/05 0444) Resp:  [10-20] 20 (12/05 0444) BP: (134-162)/(82-94) 134/91 (12/05 0444) SpO2:  [95 %-100 %] 96 % (12/05 0444) Weight:  [68.7 kg-70.5 kg] 68.7 kg (12/05 0444)  Physical Exam: General: NAD, well appearing  Cardiovascular: RRR, no m/rg Respiratory: CTAB, NWOB on RA Abdomen: flat, soft, NTND Extremities: no edema noted in bilateral lower extremities  Skin: catheter in place at R upper chest, no obvious signs of infection, clean and intact   Laboratory: Most recent CBC Lab Results  Component Value Date   WBC 12.4 (H) 08/02/2023   HGB 7.6 (L) 08/02/2023   HCT 22.0 (L) 08/02/2023   MCV 72.6 (L) 08/02/2023   PLT 174 08/02/2023   Most recent BMP    Latest Ref Rng & Units 08/02/2023    5:45 AM  BMP  Glucose 70 - 99 mg/dL 102   BUN 6 - 20 mg/dL 725   Creatinine 3.66 - 1.24 mg/dL 44.03   Sodium 474 - 259 mmol/L 135   Potassium 3.5 - 5.1 mmol/L 2.8   Chloride 98 - 111 mmol/L 101   CO2 22 - 32 mmol/L 19   Calcium 8.9 - 10.3 mg/dL 5.8     Other pertinent labs    Imaging/Diagnostic Tests:  No new images  Adam Ruddle Caleyah Jr, MD 08/02/2023, 7:24 AM  PGY-1, [facility] FPTS Intern pager: [phone], text pages welcome Secure chat group [facility] Teaching Service

## 2023-08-02 NOTE — TOC Initial Note (Signed)
Transition of Care [facility]) - Initial/Assessment Note    Patient Details  Name: Adam Davis MRN: [id number] Date of Birth: Feb 11, 1976  Transition of [facility] CM/SW Contact:    Harriet Masson, RN Phone Number: 08/02/2023, 1:36 PM  Clinical Narrative:                 Spoke to patient and wife at bedside. Patient states he follows up with PCP at [facility] on [address]. French Ana, renal navigator, is working to clip patient. Patient drives himself but wife can assist as needed. [facility] [address] [facility].  TOC will continue to follow for needs.     Barriers to Discharge: Continued Medical Work up   Patient Goals and CMS Choice Patient states their goals for this hospitalization and ongoing recovery are:: return home          Expected Discharge Plan and Services   Discharge Planning Services: CM Consult   Living arrangements for the past 2 months: Apartment                                      Prior Living Arrangements/Services Living arrangements for the past 2 months: Apartment Lives with:: Spouse Patient language and need for interpreter reviewed:: Yes Do you feel safe going back to the place where you live?: Yes      Need for Family Participation in Patient Care: Yes (Comment) Care giver support system in place?: Yes (comment)   Criminal Activity/Legal Involvement Pertinent to Current Situation/Hospitalization: No - Comment as needed  Activities of Daily Living   ADL Screening (condition at time of admission) Independently performs ADLs?: Yes (appropriate for developmental age) Is the patient deaf or have difficulty hearing?: No Does the patient have difficulty seeing, even when wearing glasses/contacts?: No Does the patient have difficulty concentrating, remembering, or making decisions?: No  Permission Sought/Granted                  Emotional Assessment Appearance:: Appears stated  age Attitude/Demeanor/Rapport: Gracious Affect (typically observed): Accepting Orientation: : Oriented to Self, Oriented to Place, Oriented to  Time, Oriented to Situation Alcohol / Substance Use: Not Applicable Psych Involvement: No (comment)  Admission diagnosis:  Hypocalcemia [E83.51] Hypokalemia [E87.6] ESRD (end stage renal disease) (HCC) [N18.6] AKI (acute kidney injury) (HCC) [N17.9] Nausea vomiting and diarrhea [R11.2, R19.7] 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] Leukocytosis, unspecified type [D72.829] Anemia due to stage 5 chronic kidney disease, 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) [N18.5, D63.1] Patient Active Problem List   Diagnosis Date Noted   Leukocytosis 08/01/2023   Anemia 07/31/2023   Erectile dysfunction 04/18/2022   Healthcare maintenance 04/18/2022   Low back pain without sciatica 12/08/2021   Type 2 diabetes mellitus with hyperglycemia (HCC) 02/09/2021   ESRD (end stage renal disease) (HCC) 02/09/2021   Uncontrolled type 2 diabetes mellitus without complication, with long-term current use of insulin 10/29/2018   Essential hypertension, benign 10/30/2012   HLD (hyperlipidemia) 08/08/2012   Onychomycosis of toenail 07/04/2012   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  [place] [address] Phone: [phone] Fax: [phone]  [facility] [address], [place] - [address] [place] [address] Phone: [phone] Fax: [phone]     Social Determinants of Health (SDOH) Social History: SDOH Screenings   Food Insecurity: No Food Insecurity (08/01/2023)  Housing:  Low Risk  (08/01/2023)  Transportation Needs: No Transportation Needs (08/01/2023)  Utilities: Not At Risk (08/01/2023)  Depression (PHQ2-9): Low Risk  (02/12/2023)  Tobacco Use: Low Risk  (07/31/2023)   SDOH Interventions:     Readmission Risk Interventions     No data to display

## 2023-08-02 NOTE — Assessment & Plan Note (Addendum)
S/p dialysis yesterday. Nephrology following, patient will likely need outpatient dialysis vs peritoneal dialysis.Patient with prolonged QT, likely 2/2 to electrolyte abnormalities.  - plan for dialysis today  - nephrology following, appreciate recs  - patient with multiple electrolyte abnormalities likely 2/2 to ESRD - supplement as needed per nephrology - immunosuppressive regimen with slow taper per nephrology  - RFP - Will repeat EKG after dialysis today  - CCM - avoid nephrotoxic medications

## 2023-08-02 NOTE — Plan of Care (Signed)

## 2023-08-02 NOTE — Consult Note (Addendum)
Hospital Consult    Reason for Consult:  dialysis access Requesting Physician:  Dr. Allena Katz  MRN #:  [id number]  History of Present Illness: This is a 47 y.o. male with past medical history of DM, HLD, HTN, and  ESRD with worsening uremic symptoms and elevated white count. He has history of failed renal transplant. He is currently being dialyzed via a temporary catheter that IR placed. Vascular surgery has been consulted for placement of a TDC as well as permanent access once his WBC resolves/ improves.  Previously had an right radiocephalic AVF and was on HD for 11 years; he states his AVF was removed at [facility] after it became infected.  He has no other history of access. He has had prior TDC's but no other central lines or access. He does not have a pacemaker. He reports no limitations in either of his upper extremities. He did not have any issues with steal symptoms in his right arm with his fistula. He is right hand dominant.   Past Medical History:  Diagnosis Date   Diabetes mellitus    H/O acute post-streptococcal glomerulonephritis 1997   History of renal transplant    Hyperlipidemia    Hypertension    Onychomycosis of toenail    Renal disorder    Tachycardia     Past Surgical History:  Procedure Laterality Date   ANKLE SURGERY     AV FISTULA PLACEMENT  1997   Removed after transplant in 2006   IR FLUORO GUIDE CV LINE RIGHT  08/01/2023   IR US GUIDE VASC ACCESS RIGHT  08/01/2023   KIDNEY SURGERY  2006   KIDNEY TRANSPLANT      Allergies  Allergen Reactions   Atorvastatin Other (See Comments)    Myalgia - resolved with discontinuation    Prior to Admission medications   Medication Sig Start Date End Date Taking? Authorizing Provider  amLODipine (NORVASC) 10 MG tablet Take 10 mg by mouth daily.   Yes [provider]  diclofenac (VOLTAREN) 75 MG EC tablet Take 1 tablet (75 mg total) by mouth 2 (two) times daily. 07/24/23  Yes Hagler, Arlys John, MD  furosemide (LASIX) 40  MG tablet Take 40 mg by mouth as needed for fluid. 06/28/23  Yes [provider]  insulin glargine-yfgn (SEMGLEE) 100 UNIT/ML Pen Inject 46 Units into the skin daily. Patient taking differently: Inject 30 Units into the skin daily. 02/01/23  Yes Paige, Lucas Mallow, DO  multivitamin (ONE-A-DAY MEN'S) TABS tablet Take 1 tablet daily by mouth.   Yes [provider]  mycophenolate (CELLCEPT) 250 MG capsule Take 500 mg by mouth 2 (two) times daily.    Yes [provider]  ondansetron (ZOFRAN) 4 MG tablet Take 4 mg by mouth 2 (two) times daily as needed. 07/17/23  Yes [provider]  predniSONE (DELTASONE) 20 MG tablet Take 2 tablets (40 mg total) by mouth daily. 07/24/23  Yes Mardella Layman, MD  rosuvastatin (CRESTOR) 10 MG tablet Take one tablet every Monday, Wednesday, and Friday. Patient taking differently: Take 5 mg by mouth 3 (three) times a week. Take one tablet every Monday, Wednesday, and Friday. 01/16/23  Yes Sabino Dick, DO  spironolactone (ALDACTONE) 25 MG tablet Take 25 mg by mouth at bedtime. 05/13/23  Yes [provider]  tacrolimus (PROGRAF) 1 MG capsule Take 1 capsule (1 mg total) by mouth 2 (two) times daily. 10/08/13  Yes Glori Luis, MD  atorvastatin (LIPITOR) 40 MG tablet Take 1 tablet (40 mg  total) by mouth daily. Patient not taking: Reported on 07/09/2017 07/13/16 05/09/19  Palma Holter, MD    Social History   Socioeconomic History   Marital status: Married    Spouse name: Not on file   Number of children: Not on file   Years of education: Not on file   Highest education level: Not on file  Occupational History   Not on file  Tobacco Use   Smoking status: Never   Smokeless tobacco: Never  Vaping Use   Vaping status: Never Used  Substance and Sexual Activity   Alcohol use: No   Drug use: No   Sexual activity: Not Currently  Other Topics Concern   Not on file  Social History Narrative   Not on file    Social Determinants of Health   Financial Resource Strain: Not on file  Food Insecurity: No Food Insecurity (08/01/2023)   Hunger Vital Sign    Worried About Running Out of Food in the Last Year: Never true    Ran Out of Food in the Last Year: Never true  Transportation Needs: No Transportation Needs (08/01/2023)   PRAPARE - Administrator, Civil Service (Medical): No    Lack of Transportation (Non-Medical): No  Physical Activity: Not on file  Stress: Not on file  Social Connections: Not on file  Intimate Partner Violence: Not At Risk (08/01/2023)   Humiliation, Afraid, Rape, and Kick questionnaire    Fear of Current or Ex-Partner: No    Emotionally Abused: No    Physically Abused: No    Sexually Abused: No     Family History  Problem Relation Age of Onset   Diabetes Brother    Diabetes Maternal Grandmother    Hypertension Maternal Grandmother     ROS: Otherwise negative unless mentioned in HPI  Physical Examination  Vitals:   08/02/23 0444 08/02/23 0809  BP: (!) 134/91 (!) 153/84  Pulse: (!) 106 (!) 102  Resp: 20 20  Temp: 99.4 F (37.4 C) 98.5 F (36.9 C)  SpO2: 96% 96%   Body mass index is 24.45 kg/m.  General:  WDWN in NAD Gait: Not observed HENT: WNL, normocephalic Pulmonary: normal non-labored breathing, without wheezing Cardiac: regular Abdomen: soft Vascular Exam/Pulses: 2+ radial pulses bilaterally, hands warm and well perfused. Right thrombosed RC fistula. 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  CBC    Component Value Date/Time   WBC 12.4 (H) 08/02/2023 0545   RBC 3.03 (L) 08/02/2023 0545   HGB 7.6 (L) 08/02/2023 0545   HGB 14.5 02/06/2017 1648   HCT 22.0 (L) 08/02/2023 0545   HCT 41.5 02/06/2017 1648   PLT 174 08/02/2023 0545   PLT 171 02/06/2017 1648    MCV 72.6 (L) 08/02/2023 0545   MCV 73 (L) 02/06/2017 1648   MCH 25.1 (L) 08/02/2023 0545   MCHC 34.5 08/02/2023 0545   RDW 14.8 08/02/2023 0545   RDW 14.8 02/06/2017 1648   LYMPHSABS 1.2 08/02/2023 0545   LYMPHSABS 2.0 02/06/2017 1648   MONOABS 1.5 (H) 08/02/2023 0545   EOSABS 0.1 08/02/2023 0545   EOSABS 0.1 02/06/2017 1648   BASOSABS 0.0 08/02/2023 0545   BASOSABS 0.0 02/06/2017 1648    BMET    Component Value Date/Time   NA 135 08/02/2023 0545   NA 140 11/07/2022 1700  K 2.8 (L) 08/02/2023 0545   CL 101 08/02/2023 0545   CO2 19 (L) 08/02/2023 0545   GLUCOSE 112 (H) 08/02/2023 0545   BUN 109 (H) 08/02/2023 0545   BUN 36 (H) 11/07/2022 1700   CREATININE 18.21 (H) 08/02/2023 0545   CREATININE 1.82 (H) 04/02/2013 1418   CALCIUM 5.8 (LL) 08/02/2023 0545   CALCIUM 9.5 10/20/2010 2227   GFRNONAA 3 (L) 08/02/2023 0545   GFRAA 68 09/19/2019 1427    COAGS: Lab Results  Component Value Date   INR 1.2 07/31/2023     Non-Invasive Vascular Imaging:   +-----------------+-------------+----------+--------------------------+  Right Cephalic   Diameter (cm)Depth (cm)         Findings           +-----------------+-------------+----------+--------------------------+  Shoulder            0.40        0.47                               +-----------------+-------------+----------+--------------------------+  Prox upper arm       0.27        0.29           branching           +-----------------+-------------+----------+--------------------------+  Mid upper arm        0.29        0.25                               +-----------------+-------------+----------+--------------------------+  Dist upper arm       0.35        0.23                               +-----------------+-------------+----------+--------------------------+  Antecubital fossa    0.65        0.24                                +-----------------+-------------+----------+--------------------------+  Prox forearm                            not visualized and Bandage  +-----------------+-------------+----------+--------------------------+  Mid forearm                                   not visualized        +-----------------+-------------+----------+--------------------------+  Dist forearm         0.32        0.14           Thrombosed          +-----------------+-------------+----------+--------------------------+  Wrist               1.22        0.14           Thrombosed          +-----------------+-------------+----------+--------------------------+   +-----------------+-------------+----------+--------+  Right Basilic    Diameter (cm)Depth (cm)Findings  +-----------------+-------------+----------+--------+  Mid upper arm        0.27        0.45             +-----------------+-------------+----------+--------+  Dist upper arm  0.22        0.19             +-----------------+-------------+----------+--------+  Antecubital fossa    0.16        0.19             +-----------------+-------------+----------+--------+  Prox forearm         0.16        0.13             +-----------------+-------------+----------+--------+  Mid forearm          0.13        0.13             +-----------------+-------------+----------+--------+  Distal forearm       0.12        0.15             +-----------------+-------------+----------+--------+  Wrist               0.10        0.16             +-----------------+-------------+----------+--------+   Distal artery thrombosed, previous h/o fistula.  +-----------------+-------------+----------+--------+  Left Cephalic    Diameter (cm)Depth (cm)Findings  +-----------------+-------------+----------+--------+  Shoulder            0.67        0.27             +-----------------+-------------+----------+--------+   Prox upper arm       0.07        0.22             +-----------------+-------------+----------+--------+  Mid upper arm        0.66        0.28             +-----------------+-------------+----------+--------+  Dist upper arm       0.11        0.11             +-----------------+-------------+----------+--------+  Antecubital fossa    0.12        0.10             +-----------------+-------------+----------+--------+  Prox forearm         0.13        0.22             +-----------------+-------------+----------+--------+  Mid forearm          0.11        0.17             +-----------------+-------------+----------+--------+  Dist forearm         0.07        0.26             +-----------------+-------------+----------+--------+  Wrist               0.07        0.17             +-----------------+-------------+----------+--------+   +-----------------+-------------+----------+---------+  Left Basilic     Diameter (cm)Depth (cm)Findings   +-----------------+-------------+----------+---------+  Mid upper arm        0.46        0.52              +-----------------+-------------+----------+---------+  Dist upper arm       0.22        0.18              +-----------------+-------------+----------+---------+  Antecubital fossa    0.23  0.15              +-----------------+-------------+----------+---------+  Prox forearm         0.21        0.10   branching  +-----------------+-------------+----------+---------+  Mid forearm          0.13        0.17              +-----------------+-------------+----------+---------+  Distal forearm       0.14        0.16              +-----------------+-------------+----------+---------+  Wrist               0.10        0.22              +-----------------+-------------+----------+---------+    Statin:  Yes.   Beta Blocker:  No. Aspirin:  No. ACEI:  No. ARB:  No. CCB  use:  Yes Other antiplatelets/anticoagulants:  No.    ASSESSMENT/PLAN: This is a 47 y.o. male with past medical history of DM, HLD, HTN, and  ESRD with worsening uremic symptoms and elevated white count. He has history of recently failed renal transplant. He is currently being dialyzed via a temporary catheter that IR placed. Vascular surgery has been consulted for placement of a TDC as well as permanent access once his WBC resolves/ improves. He has old RC fistula in right arm. This previously ligated/ excised due to infection. He is right hand dominant. Vein mapping was complete and shows very marginal venous conduits in either upper extremity for a fistula. Possibly has a right cephalic vein that can be used. I discussed with patient that he would best be served with a fistula if possible considering his young age. I have encouraged him to consider a right cephalic vein fistula vs graft. Should he want to stay out of his right arm he likely would need a graft in the left arm. He is understandably very overwhelmed with everything going on and learning about his renal transplant failure. He would like some time to think about what he would like to do. Either way we would want to wait for resolution of his leukocytosis. We could possibly place [facility] prior to d/c and he could be brought back for his access placement, however next best OR availability is Monday 12/9. The on call vascular surgeon, Dr. Lenell Antu will see patient later this afternoon to re discuss with patient his access options and provide further management plans.     Graceann Congress PA-C [facility] [phone] 08/02/2023  9:01 AM  VASCULAR STAFF ADDENDUM: I have independently interviewed and examined the patient. I agree with the above.  Await resolution of leukocytosis, per primary team. Will plan for [facility] and left arm arteriovenous graft on Monday.  Rande Brunt. Lenell Antu, MD [facility]  [facility] Phone Number: [phone] 08/02/2023 4:30 PM

## 2023-08-02 NOTE — Progress Notes (Signed)
Received patient in bed to unit.  Alert and oriented.  Informed consent signed and in chart.   TX duration:2.5  Patient tolerated well.  Transported back to the room  Alert, without acute distress.  Hand-off given to patient's nurse.   Access used: right hd catheter Access issues: none  Total UF removed: Medication(s) given: none   08/02/23 1717  Vitals  BP (!) 156/95  MAP (mmHg) 114  BP Location Left Arm  BP Method Automatic  Patient Position (if appropriate) Lying  Pulse Rate (!) 106  Pulse Rate Source Monitor  ECG Heart Rate (!) 105  Resp (!) 23  Oxygen Therapy  SpO2 100 %  O2 Device Room Air  During Treatment Monitoring  Blood Flow Rate (mL/min) 199 mL/min  Arterial Pressure (mmHg) -77.57 mmHg  Venous Pressure (mmHg) 69.89 mmHg  TMP (mmHg) 1.82 mmHg  Ultrafiltration Rate (mL/min) 0 mL/min  Dialysate Flow Rate (mL/min) 299 ml/min  Dialysate Potassium Concentration 4  Dialysate Calcium Concentration 2.25  Duration of HD Treatment -hour(s) 2.5 hour(s)  Cumulative Fluid Removed (mL) per Treatment  320.51  HD Safety Checks Performed Yes  Intra-Hemodialysis Comments Tx completed (pt c/o of cramping towards end of tx uf off after rinse back denies cramping)  Dialysis Fluid Bolus Normal Saline  Bolus Amount (mL) 300 mL      Trebor Galdamez S [facility]

## 2023-08-02 NOTE — Assessment & Plan Note (Addendum)
Patient with WBC 14 on admission, now downtrending. No obvious signs of infection. Will ctm.  - vital signs per unit - AM CBC with diff

## 2023-08-02 NOTE — Progress Notes (Signed)
Patient ID: Adam Davis, male   DOB: February 07, 1976, 47 y.o.   MRN: [id number] [facility] Progress Note   Assessment/ Plan:   1.  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  Symptomatically improving. 2. ESRD: With failed deceased donor renal transplant that was functional for the past 18 years.  Now with worsening uremic symptoms prompting initiation of dialysis.  Temporary dialysis catheter was placed by interventional radiology and I will consult vascular surgery for assistance with permanent access placement.  The goal is for conversion to a tunneled dialysis catheter when leukocytosis has resolved/improved. 3. Anemia: Likely secondary to chronic disease including chronic kidney disease.  Iron stores appear to be adequate, begin ESA. 4. CKD-MBD: Significant hyperphosphatemia noted with low calcium level.  Continue calcium acetate 3 times a day for phosphorus binding. 5.  Hypokalemia: Will reorder supplement via oral route and perform dialysis with 4K bath. 6. Hypertension: Elevated blood pressure noted, resume oral antihypertensive therapy and monitor with hemodialysis.  He appears to be euvolemic on exam.  Subjective:   Reports an uneventful night and tolerated dialysis without problems   Objective:   BP (!) 153/84 (BP Location: Left Arm)   Pulse (!) 102   Temp 98.5 F (36.9 C) (Oral)   Resp 20   Ht 5\' 6"  (1.676 m)   Wt 68.7 kg Comment: Scale A  SpO2 96%   BMI 24.45 kg/m   Physical Exam: Gen: Comfortably resting in bed CVS: Pulse regular tachycardia, S1 and S2 normal.  Right IJ temporary dialysis catheter Resp: Anteriorly clear to auscultation, no rales/rhonchi Abd: Soft, flat, nontender, bowel sounds normal Ext: No lower extremity edema.  Scar from previously ligated right RCF noted  Labs: BMET Recent Labs  Lab 07/31/23 1632 08/01/23 0352 08/01/23 1013 08/02/23 0545  NA 135 136 133* 135  K  2.8* 2.6* 3.2* 2.8*  CL 98 102 102 101  CO2 14* 14* 15* 19*  GLUCOSE 102* 41* 96 112*  BUN 172* 171* 169* 109*  CREATININE 26.24* 24.50* 24.50* 18.21*  CALCIUM 5.4* 5.7* 5.6* 5.8*  PHOS  --  9.0* 8.7* 5.0*   CBC Recent Labs  Lab 07/31/23 1632 08/01/23 0352 08/01/23 1013 08/01/23 2322 08/02/23 0545  WBC 13.2* 14.0*  --   --  12.4*  NEUTROABS 10.2*  --   --   --  9.4*  HGB 7.1* 6.5* 6.3* 8.0* 7.6*  HCT 20.5* 19.3* 19.3* 23.5* 22.0*  MCV 70.9* 70.4*  --   --  72.6*  PLT 209 191  --   --  174    Medications:     sodium chloride   Intravenous Once   amLODipine  10 mg Oral Daily   calcium acetate  1,334 mg Oral TID WC   Chlorhexidine Gluconate Cloth  6 each Topical Q0600   darbepoetin (ARANESP) injection - NON-DIALYSIS  40 mcg Subcutaneous Q Wed-1800   heparin  5,000 Units Subcutaneous Q8H   insulin aspart  0-6 Units Subcutaneous TID WC   mycophenolate  250 mg Oral BID   tacrolimus  1 mg Oral BID   Zetta Bills, MD 08/02/2023, 8:44 AM

## 2023-08-02 NOTE — Assessment & Plan Note (Addendum)
Hg was 6.5 on admission and patient is s/p one unit pRBC. Repeat hg 7.6.  - repeat CBC tomorrow AM - Aranesp per nephro - CTM

## 2023-08-03 DIAGNOSIS — N186 End stage renal disease: Secondary | ICD-10-CM | POA: Diagnosis not present

## 2023-08-03 LAB — MAGNESIUM: Magnesium: 1.6 mg/dL — ABNORMAL LOW (ref 1.7–2.4)

## 2023-08-03 LAB — RENAL FUNCTION PANEL
Albumin: 2.5 g/dL — ABNORMAL LOW (ref 3.5–5.0)
Anion gap: 10 (ref 5–15)
BUN: 65 mg/dL — ABNORMAL HIGH (ref 6–20)
CO2: 23 mmol/L (ref 22–32)
Calcium: 5.9 mg/dL — CL (ref 8.9–10.3)
Chloride: 101 mmol/L (ref 98–111)
Creatinine, Ser: 12.79 mg/dL — ABNORMAL HIGH (ref 0.61–1.24)
GFR, Estimated: 4 mL/min — ABNORMAL LOW
Glucose, Bld: 142 mg/dL — ABNORMAL HIGH (ref 70–99)
Phosphorus: 3.9 mg/dL (ref 2.5–4.6)
Potassium: 2.9 mmol/L — ABNORMAL LOW (ref 3.5–5.1)
Sodium: 134 mmol/L — ABNORMAL LOW (ref 135–145)

## 2023-08-03 LAB — CBC
HCT: 21.5 % — ABNORMAL LOW (ref 39.0–52.0)
Hemoglobin: 7.3 g/dL — ABNORMAL LOW (ref 13.0–17.0)
MCH: 24.9 pg — ABNORMAL LOW (ref 26.0–34.0)
MCHC: 34 g/dL (ref 30.0–36.0)
MCV: 73.4 fL — ABNORMAL LOW (ref 80.0–100.0)
Platelets: 160 10*3/uL (ref 150–400)
RBC: 2.93 MIL/uL — ABNORMAL LOW (ref 4.22–5.81)
RDW: 14.6 % (ref 11.5–15.5)
WBC: 9.2 10*3/uL (ref 4.0–10.5)
nRBC: 0 % (ref 0.0–0.2)

## 2023-08-03 LAB — GLUCOSE, CAPILLARY
Glucose-Capillary: 124 mg/dL — ABNORMAL HIGH (ref 70–99)
Glucose-Capillary: 121 mg/dL — ABNORMAL HIGH (ref 70–99)
Glucose-Capillary: 286 mg/dL — ABNORMAL HIGH (ref 70–99)
Glucose-Capillary: 311 mg/dL — ABNORMAL HIGH (ref 70–99)
Glucose-Capillary: 169 mg/dL — ABNORMAL HIGH (ref 70–99)

## 2023-08-03 LAB — TACROLIMUS LEVEL: Tacrolimus (FK506) - LabCorp: 3 ng/mL (ref 2.0–20.0)

## 2023-08-03 MED ORDER — INSULIN ASPART 100 UNIT/ML IJ SOLN
0.0000 [IU] | Freq: Three times a day (TID) | INTRAMUSCULAR | Status: DC
  Administered 2023-08-03: 3 [IU] via SUBCUTANEOUS
  Administered 2023-08-04: 2 [IU] via SUBCUTANEOUS
  Administered 2023-08-04: 3 [IU] via SUBCUTANEOUS
  Administered 2023-08-05: 4 [IU] via SUBCUTANEOUS
  Administered 2023-08-05: 2 [IU] via SUBCUTANEOUS
  Administered 2023-08-06: 3 [IU] via SUBCUTANEOUS
  Administered 2023-08-06 – 2023-08-07 (×2): 1 [IU] via SUBCUTANEOUS

## 2023-08-03 MED ORDER — ACETAMINOPHEN 325 MG PO TABS
650.0000 mg | ORAL_TABLET | Freq: Four times a day (QID) | ORAL | Status: DC | PRN
  Administered 2023-08-03 (×2): 650 mg via ORAL
  Filled 2023-08-03 (×2): qty 2

## 2023-08-03 MED ORDER — HYDROXYZINE HCL 10 MG PO TABS
10.0000 mg | ORAL_TABLET | Freq: Once | ORAL | Status: AC | PRN
  Administered 2023-08-03: 10 mg via ORAL
  Filled 2023-08-03: qty 1

## 2023-08-03 MED ORDER — CALCITRIOL 0.25 MCG PO CAPS
0.2500 ug | ORAL_CAPSULE | Freq: Every day | ORAL | Status: DC
  Administered 2023-08-03 – 2023-08-07 (×5): 0.25 ug via ORAL
  Filled 2023-08-03 (×5): qty 1

## 2023-08-03 MED ORDER — POTASSIUM CHLORIDE CRYS ER 20 MEQ PO TBCR
40.0000 meq | EXTENDED_RELEASE_TABLET | Freq: Two times a day (BID) | ORAL | Status: AC
  Administered 2023-08-03 (×2): 40 meq via ORAL
  Filled 2023-08-03 (×2): qty 2

## 2023-08-03 MED ORDER — HEPARIN SODIUM (PORCINE) 1000 UNIT/ML IJ SOLN
INTRAMUSCULAR | Status: AC
  Administered 2023-08-03: 1000 [IU]
  Filled 2023-08-03: qty 3

## 2023-08-03 MED ORDER — CALCIUM GLUCONATE-NACL 2-0.675 GM/100ML-% IV SOLN
2.0000 g | Freq: Once | INTRAVENOUS | Status: AC
  Administered 2023-08-03: 2000 mg via INTRAVENOUS
  Filled 2023-08-03: qty 100

## 2023-08-03 MED ORDER — INSULIN ASPART 100 UNIT/ML IJ SOLN
4.0000 [IU] | Freq: Once | INTRAMUSCULAR | Status: AC
  Administered 2023-08-03: 4 [IU] via SUBCUTANEOUS

## 2023-08-03 MED ORDER — SODIUM CHLORIDE 0.9 % IV BOLUS
250.0000 mL | Freq: Once | INTRAVENOUS | Status: AC
  Administered 2023-08-03: 250 mL via INTRAVENOUS

## 2023-08-03 MED ORDER — MELATONIN 3 MG PO TABS
3.0000 mg | ORAL_TABLET | Freq: Every day | ORAL | Status: DC
  Administered 2023-08-03 – 2023-08-06 (×4): 3 mg via ORAL
  Filled 2023-08-03 (×4): qty 1

## 2023-08-03 NOTE — Assessment & Plan Note (Deleted)
 Patient with WBC 14 on admission, now downtrending. No obvious signs of infection. Will ctm.  - vital signs per unit - AM CBC with diff

## 2023-08-03 NOTE — Progress Notes (Signed)
Daily Progress Note Intern Pager: [phone]  Patient name: Adam Davis Medical record number: [id number] Date of birth: 26-Nov-1975 Age: 47 y.o. Gender: male  Primary Care Provider: Program, [facility] Consultants: Nephrology  Code Status: Full  Pt Overview and Major Events to Date:  12/3: admitted 12/4: dialysis session  12/5: dialysis session  12/6: dialysis session   Assessment and Plan: Mr. Colletta is a 47 year old male admitted for progression of CKD to ESRD.  Patient has had 2 days of dialysis. Nephrology is following.  Plan for dialysis today prior to discussion of outpatient dialysis.  Hemoglobin this morning was stable this AM.  Assessment & Plan ESRD (end stage renal disease) (HCC) S/p dialysis x2 days. Nephrology following, patient will need outpatient dialysis. Patient still has prolonged QT, likely 2/2 to electrolyte abnormalities. Patient complaining of leg cramps, likely 2/2 to hypocalcemia.  - plan for dialysis today  - patient would like to opt for OP dialysis, plan for conversion to [facility] with left arm AVG on Monday  - nephrology following, appreciate recs  - patient with multiple electrolyte abnormalities likely 2/2 to ESRD - supplement as needed per nephrology - immunosuppressive regimen with slow taper per nephrology  - RFP - will check mag level  - CCM - avoid nephrotoxic medications  Anemia Hg was 6.5 on admission and patient is s/p one unit pRBC. Hg remains stable at 7.6 > 7.3.  - Aranesp per nephro - CTM   Chronic and Stable Problems:  DMII: patient did have one incidence of CBG >200. Will reorder very sensitive sliding scale, continue to monitor CBGs with meals.  HTN: continue norvasc 10mg , hold lasix and spironolactone   FEN/GI: renal diet with 1200 mL fluid restriction  PPx: subcutaneous heparin Dispo: Pending clinical improvement and dialysis. Patient will likely need outpatient dialysis set up prior to  discharge.     Subjective:  Patient reports feeling much better today. He is worried about work with this dialysis but does not have any complaints at this time.    Objective: Temp:  [98.5 F (36.9 C)-100.1 F (37.8 C)] 99.7 F (37.6 C) (12/06 0439) Pulse Rate:  [97-106] 98 (12/05 1900) Resp:  [14-27] 19 (12/06 0600) BP: (126-166)/(77-99) 154/91 (12/06 0600) SpO2:  [96 %-100 %] 99 % (12/06 0439) Weight:  [68 kg-70.2 kg] 68 kg (12/05 1736)  Physical Exam: General: seen in dialysis, NAD  Cardiovascular: RRR, no m/rg Respiratory: NWOB on RA  Abdomen: flat, soft, NTND MSK: able to move bilateral lower extremities without pain   Laboratory: Most recent CBC Lab Results  Component Value Date   WBC 9.2 08/03/2023   HGB 7.3 (L) 08/03/2023   HCT 21.5 (L) 08/03/2023   MCV 73.4 (L) 08/03/2023   PLT 160 08/03/2023   Most recent BMP    Latest Ref Rng & Units 08/03/2023    5:00 AM  BMP  Glucose 70 - 99 mg/dL 425   BUN 6 - 20 mg/dL 65   Creatinine 9.56 - 1.24 mg/dL 38.75   Sodium 643 - 329 mmol/L 134   Potassium 3.5 - 5.1 mmol/L 2.9   Chloride 98 - 111 mmol/L 101   CO2 22 - 32 mmol/L 23   Calcium 8.9 - 10.3 mg/dL 5.9     Other pertinent labs    Imaging/Diagnostic Tests:  No new images   Penne Lash, MD 08/03/2023, 7:25 AM  PGY-1, [place] FPTS Intern pager: [phone], text pages welcome  Secure chat group [facility] Teaching Service

## 2023-08-03 NOTE — Progress Notes (Signed)
Mobility Specialist Progress Note:   08/03/23 1000  Mobility  Activity Ambulated independently in hallway  Level of Assistance Standby assist, set-up cues, supervision of patient - no hands on  Assistive Device None  Distance Ambulated (ft) 475 ft  Activity Response Tolerated well  Mobility Referral Yes  Mobility visit 1 Mobility  Mobility Specialist Start Time (ACUTE ONLY) 0848  Mobility Specialist Stop Time (ACUTE ONLY) 0854  Mobility Specialist Time Calculation (min) (ACUTE ONLY) 6 min    Pre Mobility: 107 HR Post Mobility:  110 HR  Pt received in chair, eager to mobility. Asymptomatic w/ no complaints throughout. Pt left on EOB with call bell and all needs met. RN and family present.  [name] Mobility Specialist Please contact via Special educational needs teacher or Rehab office at [phone]

## 2023-08-03 NOTE — Plan of Care (Signed)
FMTS Interim Progress Note  S:Assessed patient at bedside due to page from nursing about muscle cramping and patient feeling restless.  Went to bedside and patient states that his cramping in his legs have now resolved.  He states that he oftentimes gets cramping after dialysis sessions but currently that is okay right now.  He states that his main complaint is that he feels restless and anxious.  He denies any chest pain or shortness of breath.  States that is just very hard for him to fall asleep in the hospital.  Will take some medications to help with this.  O: BP 126/77 (BP Location: Left Arm)   Pulse 98   Temp 100.1 F (37.8 C) (Oral)   Resp 17   Ht 5\' 6"  (1.676 m)   Wt 68 kg Comment: standing  SpO2 98%   BMI 24.20 kg/m    General: awake, alert, responsive to questions Head: Normocephalic atraumatic CV: Regular rate and rhythm no murmurs rubs or gallops Respiratory: Clear to ausculation bilaterally, chest rises symmetrically,  no increased work of breathing on RA Extremities: Moves upper and lower extremities freely, no tenderness of palpation of calves b/l  A/P: Anxiety/Insomnia Likely situational, no red flags of CP/respiratory distress. -atarax 10 mg PRN x 1 -melatonin 3 mg  Levin Erp, MD 08/03/2023, 2:13 AM PGY-3, [facility] Service pager [phone]

## 2023-08-03 NOTE — Assessment & Plan Note (Addendum)
Hg was 6.5 on admission and patient is s/p one unit pRBC. Hg remains stable at 7.6 > 7.3.  - Aranesp per nephro - CTM

## 2023-08-03 NOTE — Progress Notes (Signed)
   08/03/23 1250  Vitals  Temp 98.7 F (37.1 C)  Pulse Rate (!) 104  Resp 15  BP (!) 155/98  SpO2 100 %  O2 Device Room Air  Weight (S)  67.8 kg (Bed Scale)  Type of Weight Post-Dialysis  Oxygen Therapy  Patient Activity (if Appropriate) In bed  Pulse Oximetry Type Continuous  Post Treatment  Dialyzer Clearance Clear  Hemodialysis Intake (mL) 0 mL  Liters Processed 54  Fluid Removed (mL) 0 mL  Post-Hemodialysis Comments Pt. started to cramping and per MD Allena Katz told RN to give pt. orange juice, D/T potassium is low. Pt. received Tylenol 650 mg for right leg pain. Report call to [facility] Bedside RN. UF goal met   Received patient in bed to unit.  Alert and oriented.  Informed consent signed and in chart.   TX duration:3  Patient tolerated well.  Transported back to the room  Alert, without acute distress.  Hand-off given to patient's nurse.   Access used: Yes Access issues: No  Total UF removed: 0 Medication(s) given: SEE MAR Post HD VS: See Above Grid Post HD weight: 67.8 kg   [facility]

## 2023-08-03 NOTE — Plan of Care (Signed)
  Problem: Education: Goal: Ability to describe self-care measures that may prevent or decrease complications (Diabetes Survival Skills Education) will improve Outcome: Progressing   Problem: Coping: Goal: Ability to adjust to condition or change in health will improve Outcome: Progressing   Problem: Fluid Volume: Goal: Ability to maintain a balanced intake and output will improve Outcome: Progressing   Problem: Health Behavior/Discharge Planning: Goal: Ability to identify and utilize available resources and services will improve Outcome: Progressing   Problem: Nutritional: Goal: Maintenance of adequate nutrition will improve Outcome: Progressing   Problem: Metabolic: Goal: Ability to maintain appropriate glucose levels will improve Outcome: Not Progressing

## 2023-08-03 NOTE — Plan of Care (Signed)
  Problem: Education: Goal: Knowledge of General Education information will improve Description: Including pain rating scale, medication(s)/side effects and non-pharmacologic comfort measures Outcome: Progressing   Problem: Health Behavior/Discharge Planning: Goal: Ability to manage health-related needs will improve Outcome: Progressing   Problem: Clinical Measurements: Goal: Will remain free from infection Outcome: Progressing Goal: Diagnostic test results will improve Outcome: Progressing   Problem: Activity: Goal: Risk for activity intolerance will decrease Outcome: Progressing   Problem: Nutrition: Goal: Adequate nutrition will be maintained Outcome: Progressing   Problem: Coping: Goal: Level of anxiety will decrease Outcome: Progressing   Problem: Elimination: Goal: Will not experience complications related to bowel motility Outcome: Progressing Goal: Will not experience complications related to urinary retention Outcome: Progressing   Problem: Pain Management: Goal: General experience of comfort will improve Outcome: Progressing   Problem: Safety: Goal: Ability to remain free from injury will improve Outcome: Progressing   Problem: Skin Integrity: Goal: Risk for impaired skin integrity will decrease Outcome: Progressing

## 2023-08-03 NOTE — Progress Notes (Signed)
Patient ID: [name], male   DOB: 04/20/1976, 47 y.o.   MRN: [id number] [facility] Progress Note   Assessment/ Plan:   1.  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  Symptomatically improving. 2. ESRD: With failed deceased donor renal transplant that was functional for the past 18 years.  Now with worsening uremic symptoms prompting initiation of dialysis,: Will get to start dialysis today without ultrafiltration.  Temporary dialysis catheter was placed by interventional radiology (tunneled not placed because of leukocytosis) on 12/4 to start dialysis.  Yesterday seen by vascular surgery with plans noted for conversion to [facility] with left arm AVG on Monday. 3. Anemia: Likely secondary to chronic disease including chronic kidney disease.  Iron stores appear to be adequate, begin ESA. 4. CKD-MBD: Significant hyperphosphatemia noted with low calcium level; phosphorus level now at goal.  Continue calcium acetate 3 times a day for phosphorus binding. 5.  Hypokalemia: Oral supplementation reordered and will get dialysis with 4K bath 6.  Hypocalcemia: Ordered intravenous calcium and will start him on calcitriol. 7. Hypertension: Elevated blood pressure noted, resume oral antihypertensive therapy and monitor with hemodialysis.  He appears to be euvolemic on exam.  Subjective:   Complains of cramps in his sides/limbs overnight that started with dialysis.  We discussed his multiple electrolyte abnormalities that are contributing to this and have ordered 250 cc NS bolus.   Objective:   BP (!) 154/91   Pulse 98   Temp 99.7 F (37.6 C) (Oral)   Resp 19   Ht 5\' 6"  (1.676 m)   Wt 68 kg Comment: standing  SpO2 (P) 99%   BMI 24.20 kg/m   Physical Exam: Gen: Sitting up comfortably on the side of his bed, wife at bedside CVS: Pulse regular tachycardia, S1 and S2 normal.  Right IJ temporary dialysis catheter Resp:  Anteriorly clear to auscultation, no rales/rhonchi Abd: Soft, flat, nontender, bowel sounds normal Ext: No lower extremity edema.  Scar from previously ligated right RCF noted  Labs: BMET Recent Labs  Lab 07/31/23 1632 08/01/23 0352 08/01/23 1013 08/02/23 0545 08/03/23 0500  NA 135 136 133* 135 134*  K 2.8* 2.6* 3.2* 2.8* 2.9*  CL 98 102 102 101 101  CO2 14* 14* 15* 19* 23  GLUCOSE 102* 41* 96 112* 142*  BUN 172* 171* 169* 109* 65*  CREATININE 26.24* 24.50* 24.50* 18.21* 12.79*  CALCIUM 5.4* 5.7* 5.6* 5.8* 5.9*  PHOS  --  9.0* 8.7* 5.0* 3.9   CBC Recent Labs  Lab 07/31/23 1632 08/01/23 0352 08/01/23 1013 08/01/23 2322 08/02/23 0545 08/03/23 0500  WBC 13.2* 14.0*  --   --  12.4* 9.2  NEUTROABS 10.2*  --   --   --  9.4*  --   HGB 7.1* 6.5* 6.3* 8.0* 7.6* 7.3*  HCT 20.5* 19.3* 19.3* 23.5* 22.0* 21.5*  MCV 70.9* 70.4*  --   --  72.6* 73.4*  PLT 209 191  --   --  174 160    Medications:     sodium chloride   Intravenous Once   amLODipine  10 mg Oral Daily   calcitRIOL  0.25 mcg Oral Daily   calcium acetate  1,334 mg Oral TID WC   Chlorhexidine Gluconate Cloth  6 each Topical Q0600   darbepoetin (ARANESP) injection - NON-DIALYSIS  40 mcg Subcutaneous Q Wed-1800   heparin  5,000 Units Subcutaneous Q8H   melatonin  3 mg Oral QHS   mycophenolate  250 mg Oral BID   potassium chloride  40 mEq Oral BID   tacrolimus  1 mg Oral BID   Zetta Bills, MD 08/03/2023, 8:36 AM

## 2023-08-03 NOTE — Plan of Care (Signed)
Contacted via page for CBG of 311. Instructed nursing to give 4u insulin per sliding scale and recheck in one hour.

## 2023-08-03 NOTE — Progress Notes (Signed)
Pt has been accepted at [facility] on TTS 10:45 am chair time. Pt can start on Tuesday, Dec 10 and will need to arrive at 9:50 am to complete paperwork prior to treatment. Met with pt and pt's wife at bedside. Discussed above arrangements and schedule letter provided. Pt and wife agreeable to plan. Update provided to nephrologist and renal NP. Arrangements added to AVS as well. Will assist as needed.   Olivia Canter Renal Navigator [phone]

## 2023-08-03 NOTE — Progress Notes (Signed)
OT Cancellation Note  Patient Details Name: Adam Davis MRN: [id number] DOB: [birth year]   Cancelled Treatment:    Reason Eval/Treat Not Completed: OT screened, no needs identified, will sign off Noted pt mobilizing well with mobility specialist without concerns, managing ADLs without issues. No formal OT eval needed at this time. Please reconsult if needs change.  Lorre Munroe 08/03/2023, 2:27 PM

## 2023-08-03 NOTE — Assessment & Plan Note (Addendum)
S/p dialysis x2 days. Nephrology following, patient will need outpatient dialysis. Patient still has prolonged QT, likely 2/2 to electrolyte abnormalities. Patient complaining of leg cramps, likely 2/2 to hypocalcemia.  - plan for dialysis today  - patient would like to opt for OP dialysis, plan for conversion to [facility] with left arm AVG on Monday  - nephrology following, appreciate recs  - patient with multiple electrolyte abnormalities likely 2/2 to ESRD - supplement as needed per nephrology - immunosuppressive regimen with slow taper per nephrology  - RFP - will check mag level  - CCM - avoid nephrotoxic medications

## 2023-08-03 NOTE — Procedures (Signed)
Patient seen on Hemodialysis. BP (!) 158/88   Pulse (!) 102   Temp 99.4 F (37.4 C) (Oral)   Resp 18   Ht 5\' 6"  (1.676 m)   Wt 69.2 kg Comment: bed  SpO2 100%   BMI 24.62 kg/m   QB 300, UF goal None Tolerating treatment without complaints at this time.   Zetta Bills MD [facility]. Office # [phone] Pager # [phone] 10:49 AM

## 2023-08-03 NOTE — Progress Notes (Signed)
PT Cancellation Note  Patient Details Name: NISHAL STRENGER MRN: [id number] DOB: [birth year]   Cancelled Treatment:    Reason Eval/Treat Not Completed: Patient at procedure or test/unavailable. Pt just left for HD. Will continue attempts.   Angelina Ok [facility] 08/03/2023, 9:58 AM Skip Mayer PT Acute [employer] [phone]

## 2023-08-04 DIAGNOSIS — R112 Nausea with vomiting, unspecified: Secondary | ICD-10-CM | POA: Insufficient documentation

## 2023-08-04 DIAGNOSIS — N186 End stage renal disease: Secondary | ICD-10-CM | POA: Diagnosis not present

## 2023-08-04 LAB — CBC
HCT: 23.2 % — ABNORMAL LOW (ref 39.0–52.0)
Hemoglobin: 7.8 g/dL — ABNORMAL LOW (ref 13.0–17.0)
MCH: 25.2 pg — ABNORMAL LOW (ref 26.0–34.0)
MCHC: 33.6 g/dL (ref 30.0–36.0)
MCV: 74.8 fL — ABNORMAL LOW (ref 80.0–100.0)
Platelets: 189 10*3/uL (ref 150–400)
RBC: 3.1 MIL/uL — ABNORMAL LOW (ref 4.22–5.81)
RDW: 14.6 % (ref 11.5–15.5)
WBC: 11 10*3/uL — ABNORMAL HIGH (ref 4.0–10.5)
nRBC: 0 % (ref 0.0–0.2)

## 2023-08-04 LAB — RENAL FUNCTION PANEL
Albumin: 2.6 g/dL — ABNORMAL LOW (ref 3.5–5.0)
Anion gap: 10 (ref 5–15)
BUN: 36 mg/dL — ABNORMAL HIGH (ref 6–20)
CO2: 23 mmol/L (ref 22–32)
Calcium: 7 mg/dL — ABNORMAL LOW (ref 8.9–10.3)
Chloride: 101 mmol/L (ref 98–111)
Creatinine, Ser: 8.97 mg/dL — ABNORMAL HIGH (ref 0.61–1.24)
GFR, Estimated: 7 mL/min — ABNORMAL LOW (ref >60)
Glucose, Bld: 147 mg/dL — ABNORMAL HIGH (ref 70–99)
Phosphorus: 2.9 mg/dL (ref 2.5–4.6)
Potassium: 4 mmol/L (ref 3.5–5.1)
Sodium: 134 mmol/L — ABNORMAL LOW (ref 135–145)

## 2023-08-04 LAB — GLUCOSE, CAPILLARY
Glucose-Capillary: 133 mg/dL — ABNORMAL HIGH (ref 70–99)
Glucose-Capillary: 254 mg/dL — ABNORMAL HIGH (ref 70–99)
Glucose-Capillary: 211 mg/dL — ABNORMAL HIGH (ref 70–99)
Glucose-Capillary: 129 mg/dL — ABNORMAL HIGH (ref 70–99)

## 2023-08-04 LAB — MAGNESIUM: Magnesium: 1.7 mg/dL (ref 1.7–2.4)

## 2023-08-04 NOTE — Plan of Care (Signed)
  Problem: Coping: Goal: Ability to adjust to condition or change in health will improve Outcome: Progressing   Problem: Nutritional: Goal: Maintenance of adequate nutrition will improve Outcome: Progressing Goal: Progress toward achieving an optimal weight will improve Outcome: Progressing   Problem: Skin Integrity: Goal: Risk for impaired skin integrity will decrease Outcome: Progressing   Problem: Activity: Goal: Risk for activity intolerance will decrease Outcome: Progressing   Problem: Nutrition: Goal: Adequate nutrition will be maintained Outcome: Progressing   Problem: Coping: Goal: Level of anxiety will decrease Outcome: Progressing   Problem: Elimination: Goal: Will not experience complications related to bowel motility Outcome: Progressing Goal: Will not experience complications related to urinary retention Outcome: Progressing

## 2023-08-04 NOTE — Progress Notes (Signed)
     Daily Progress Note Intern Pager: [phone]  Patient name: Adam Davis Medical record number: [id number] Date of birth: 02-Jan-1976 Age: 47 y.o. Gender: male  Primary Care Provider: Program, [facility] Residency Consultants: Nephrology Code Status: Full  Pt Overview and Major Events to Date:  12/3: Admitted 12/4: Transfused 1 unit PRBC 12/4-6: HD  Assessment and Plan: Adam Davis is a 47 y.o. 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  He is awaiting outpatient dialysis placement and left arm AVG. Pertinent PMH/PSH includes T2DM, HTN, renal transplant, PSGN.  Assessment & Plan ESRD (end stage renal disease) (HCC) S/p dialysis x3 days.  Awaiting outpatient dialysis placement.  Prolonged QT, likely 2/2 to electrolyte abnormalities.  -Nephrology following, appreciate recommendations - Plan for conversion to [facility] with left arm AVG on Monday  - Multiple electrolyte abnormalities likely 2/2 to ESRD - supplement as needed per nephrology (calcitriol 0.25 mcg daily, PhosLo 3 times daily) - immunosuppressive regimen with slow taper per nephrology  -AM RFP, mag - avoid nephrotoxic medications  Anemia S/p 1u pRBC. Hg remains stable at 7.8.  - Aranesp per nephro -AM CBC Nausea & vomiting New onset, 1 occurrence per RN overnight.  Improved with Compazine.  Abdominal exam unremarkable. -Continue to monitor  Chronic and Stable Issues: DMII: Very sensitive SSI, consider increasing to sensitive SSI  HTN: continue norvasc 10mg , hold lasix and spironolactone   FEN/GI: Renal diet with 1200 mL fluid restriction PPx: Heparin subcu every 8 hours Dispo:Home in 2-3 days. Barriers include outpatient dialysis placement.   Subjective:  Patient denies any complaints.  Wife notes that he had several vomiting episodes overnight but this resolved.  Objective: Temp:  [98.5 F (36.9 C)-100.2 F (37.9 C)] 98.9 F (37.2 C) (12/07 0504) Pulse Rate:  [99-110] 100 (12/07  0504) Resp:  [14-20] 18 (12/07 0504) BP: (134-168)/(84-107) 139/84 (12/07 0504) SpO2:  [97 %-100 %] 97 % (12/07 0504) Weight:  [67.8 kg-69.2 kg] 67.8 kg (12/06 1250) Physical Exam: General: well-appearing Cardiovascular: RRR, 3/6 systolic murmur Respiratory: CTAB, normal WOB Abdomen: soft, nontender, normoactive BS Extremities: no pitting edema of BLEs  Laboratory: Most recent CBC Lab Results  Component Value Date   WBC 11.0 (H) 08/04/2023   HGB 7.8 (L) 08/04/2023   HCT 23.2 (L) 08/04/2023   MCV 74.8 (L) 08/04/2023   PLT 189 08/04/2023   Most recent BMP    Latest Ref Rng & Units 08/04/2023    5:15 AM  BMP  Glucose 70 - 99 mg/dL 875   BUN 6 - 20 mg/dL 36   Creatinine 6.43 - 1.24 mg/dL 3.29   Sodium 518 - 841 mmol/L 134   Potassium 3.5 - 5.1 mmol/L 4.0   Chloride 98 - 111 mmol/L 101   CO2 22 - 32 mmol/L 23   Calcium 8.9 - 10.3 mg/dL 7.0    CBG (last 3)  Recent Labs    08/03/23 2119 08/03/23 2326 08/04/23 0619  GLUCAP 311* 169* 133*   Mg 1.7  Imaging/Diagnostic Tests: No recent imaging  Shelby Mattocks, DO 08/04/2023, 7:03 AM  PGY-3, [facility] FPTS Intern pager: [phone], text pages welcome Secure chat group [facility] Teaching Service

## 2023-08-04 NOTE — Assessment & Plan Note (Addendum)
New onset, 1 occurrence per RN overnight.  Improved with Compazine.  Abdominal exam unremarkable. -Continue to monitor

## 2023-08-04 NOTE — Assessment & Plan Note (Deleted)
 Patient with WBC 14 on admission, now downtrending. No obvious signs of infection. Will ctm.  - vital signs per unit - AM CBC with diff

## 2023-08-04 NOTE — Assessment & Plan Note (Signed)
Receiving dialysis with nephrology while inpatient, s/p failed deceased donor renal transplant. Plan for conversion to [facility] with left arm AVG on Monday per nephrology note.  - Nephrology following, appreciate recommendations - Plan for conversion to [facility] with left arm AVG on Monday  - Multiple electrolyte abnormalities likely 2/2 to ESRD - supplement as needed per nephrology (calcitriol 0.25 mcg daily, PhosLo 3 times daily) - immunosuppressive regimen with slow taper per nephrology  - AM RFP, mag - avoid nephrotoxic medications

## 2023-08-04 NOTE — Progress Notes (Signed)
Patient ID: Adam Davis, male   DOB: 1976-07-11, 47 y.o.   MRN: [id number] [facility] Progress Note   Assessment/ Plan:   1.  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  Symptomatically improved/resolved 2. ESRD: With failed deceased donor renal transplant that was functional for the past 18 years; now status post 3 dialysis treatments.  Temporary dialysis catheter was placed by interventional radiology (tunneled not placed because of leukocytosis) on 12/4 to start dialysis.  Seen earlier this week by vascular surgery with plans noted for conversion to [facility] with left arm AVG on Monday. 3. Anemia: Likely secondary to chronic disease including chronic kidney disease.  On ESA with adequate iron stores. 4. CKD-MBD: Significant hyperphosphatemia noted with low calcium level; phosphorus level now at goal.  Continue calcium acetate 3 times a day for phosphorus binding. 5.  Hypokalemia: Corrected with oral supplementation/dialysis. 6.  Hypocalcemia: Improved with intravenous calcium supplementation and oral calcium based phosphorus binder. 7. Hypertension: Improving with hemodialysis and resumption of oral antihypertensive therapy.  Euvolemic.  Subjective:   Tolerated dialysis yesterday with acute event, was bothered by pain in his right hip/knee during dialysis.  No cramping overnight.   Objective:   BP 139/84 (BP Location: Left Arm)   Pulse 99   Temp 99 F (37.2 C) (Oral)   Resp 18   Ht 5\' 6"  (1.676 m)   Wt (S) 67.8 kg Comment: Bed Scale  SpO2 99%   BMI 24.13 kg/m   Physical Exam: Gen: Comfortably sitting on the side of his bed, eating breakfast.  Wife at bedside CVS: Pulse regular tachycardia, S1 and S2 normal.  Right IJ temporary dialysis catheter Resp: Anteriorly clear to auscultation, no rales/rhonchi Abd: Soft, flat, nontender, bowel sounds normal Ext: No lower extremity edema.  Scar from previously  ligated right RCF noted  Labs: BMET Recent Labs  Lab 07/31/23 1632 08/01/23 0352 08/01/23 1013 08/02/23 0545 08/03/23 0500 08/04/23 0515  NA 135 136 133* 135 134* 134*  K 2.8* 2.6* 3.2* 2.8* 2.9* 4.0  CL 98 102 102 101 101 101  CO2 14* 14* 15* 19* 23 23  GLUCOSE 102* 41* 96 112* 142* 147*  BUN 172* 171* 169* 109* 65* 36*  CREATININE 26.24* 24.50* 24.50* 18.21* 12.79* 8.97*  CALCIUM 5.4* 5.7* 5.6* 5.8* 5.9* 7.0*  PHOS  --  9.0* 8.7* 5.0* 3.9 2.9   CBC Recent Labs  Lab 07/31/23 1632 08/01/23 0352 08/01/23 1013 08/01/23 2322 08/02/23 0545 08/03/23 0500 08/04/23 0515  WBC 13.2* 14.0*  --   --  12.4* 9.2 11.0*  NEUTROABS 10.2*  --   --   --  9.4*  --   --   HGB 7.1* 6.5*   < > 8.0* 7.6* 7.3* 7.8*  HCT 20.5* 19.3*   < > 23.5* 22.0* 21.5* 23.2*  MCV 70.9* 70.4*  --   --  72.6* 73.4* 74.8*  PLT 209 191  --   --  174 160 189   < > = values in this interval not displayed.    Medications:     sodium chloride   Intravenous Once   amLODipine  10 mg Oral Daily   calcitRIOL  0.25 mcg Oral Daily   calcium acetate  1,334 mg Oral TID WC   Chlorhexidine Gluconate Cloth  6 each Topical Q0600   darbepoetin (ARANESP) injection - NON-DIALYSIS  40 mcg Subcutaneous Q Wed-1800   heparin  5,000 Units Subcutaneous Q8H  insulin aspart  0-6 Units Subcutaneous TID WC   melatonin  3 mg Oral QHS   mycophenolate  250 mg Oral BID   tacrolimus  1 mg Oral BID   Zetta Bills, MD 08/04/2023, 8:18 AM

## 2023-08-04 NOTE — Assessment & Plan Note (Addendum)
S/p dialysis x3 days.  Awaiting outpatient dialysis placement.  Prolonged QT, likely 2/2 to electrolyte abnormalities.  -Nephrology following, appreciate recommendations - Plan for conversion to [facility] with left arm AVG on Monday  - Multiple electrolyte abnormalities likely 2/2 to ESRD - supplement as needed per nephrology (calcitriol 0.25 mcg daily, PhosLo 3 times daily) - immunosuppressive regimen with slow taper per nephrology  -AM RFP, mag - avoid nephrotoxic medications

## 2023-08-04 NOTE — Progress Notes (Signed)
PT Cancellation Note  Patient Details Name: Adam Davis MRN: [id number] DOB: 1976/07/09   Cancelled Treatment:    Reason Eval/Treat Not Completed: PT screened, no needs identified, will sign off. Patient mobilizing independently and with mobility specialists. Spoke with patient who reports no questions or concerns. Acute PT will sign off.  Ina Homes, PT, DPT Acute Rehabilitation Services  Personal: Secure Chat Rehab Office: [phone]  Adam Davis 08/04/2023, 8:18 AM

## 2023-08-04 NOTE — Assessment & Plan Note (Addendum)
S/p 1u pRBC. Hg remains stable at 7.8.  - Aranesp per nephro -AM CBC

## 2023-08-04 NOTE — Progress Notes (Signed)
  ***   Daily Progress Note Intern Pager: [phone]  Patient name: Adam Davis Medical record number: [id number] Date of birth: [birth year] Age: 47 y.o. Gender: male  Primary Care Provider: Program, [facility] Residency Consultants: Nephrology Code Status: Full   Pt Overview and Major Events to Date:  12/3: Admitted 12/4: Transfused 1 unit PRBC 12/4-6: HD   Assessment and Plan: Adam Davis is a 47 y.o. 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  He is awaiting outpatient dialysis placement and left arm AVG. Pertinent PMH/PSH includes T2DM, HTN, renal transplant, PSGN.   Assessment & Plan ESRD (end stage renal disease) (HCC) Receiving dialysis with nephrology while inpatient, s/p failed deceased donor renal transplant. Plan for conversion to [facility] with left arm AVG on Monday per nephrology note.  - Nephrology following, appreciate recommendations - Plan for conversion to [facility] with left arm AVG on Monday  - Multiple electrolyte abnormalities likely 2/2 to ESRD - supplement as needed per nephrology (calcitriol 0.25 mcg daily, PhosLo 3 times daily) - immunosuppressive regimen with slow taper per nephrology  - AM RFP, mag - avoid nephrotoxic medications  Anemia Hgb *** - Aranesp per nephro -AM CBC Nausea & vomiting     Chronic and Stable Issues: DMII: Very sensitive SSI, consider increasing to sensitive SSI  HTN: continue norvasc 10mg , hold lasix and spironolactone    FEN/GI: Renal diet with 1200 mL fluid restriction PPx: Heparin subcu every 8 hours Dispo:Home in 2-3 days. Barriers include outpatient dialysis placement.   Subjective:  ***  Objective: Temp:  [98.7 F (37.1 C)-100.2 F (37.9 C)] 98.7 F (37.1 C) (12/07 1542) Pulse Rate:  [99-116] 100 (12/07 1542) Resp:  [16-18] 16 (12/07 1112) BP: (128-151)/(84-99) 128/88 (12/07 1542) SpO2:  [97 %-99 %] 99 % (12/07 1542) General: Alert and oriented in no apparent distress Heart: Regular  rate and rhythm with no murmurs appreciated Lungs: CTA bilaterally, no wheezing Abdomen: Bowel sounds present, no abdominal pain Skin: Warm and dry Extremities: No lower extremity edema   Laboratory: Most recent CBC Lab Results  Component Value Date   WBC 11.0 (H) 08/04/2023   HGB 7.8 (L) 08/04/2023   HCT 23.2 (L) 08/04/2023   MCV 74.8 (L) 08/04/2023   PLT 189 08/04/2023   Most recent BMP    Latest Ref Rng & Units 08/04/2023    5:15 AM  BMP  Glucose 70 - 99 mg/dL 244   BUN 6 - 20 mg/dL 36   Creatinine 0.10 - 1.24 mg/dL 2.72   Sodium 536 - 644 mmol/L 134   Potassium 3.5 - 5.1 mmol/L 4.0   Chloride 98 - 111 mmol/L 101   CO2 22 - 32 mmol/L 23   Calcium 8.9 - 10.3 mg/dL 7.0     Alfredo Martinez, MD 08/04/2023, 8:23 PM  PGY-3, [facility] FPTS Intern pager: [phone], text pages welcome Secure chat group [facility] Teaching Service

## 2023-08-04 NOTE — Assessment & Plan Note (Signed)
Hgb pending this AM  - Aranesp per nephro -AM CBC

## 2023-08-05 DIAGNOSIS — N186 End stage renal disease: Secondary | ICD-10-CM | POA: Diagnosis not present

## 2023-08-05 LAB — GLUCOSE, CAPILLARY
Glucose-Capillary: 135 mg/dL — ABNORMAL HIGH (ref 70–99)
Glucose-Capillary: 211 mg/dL — ABNORMAL HIGH (ref 70–99)
Glucose-Capillary: 315 mg/dL — ABNORMAL HIGH (ref 70–99)
Glucose-Capillary: 335 mg/dL — ABNORMAL HIGH (ref 70–99)
Glucose-Capillary: 266 mg/dL — ABNORMAL HIGH (ref 70–99)

## 2023-08-05 LAB — CULTURE, BLOOD (ROUTINE X 2)
Culture: NO GROWTH
Culture: NO GROWTH
Special Requests: ADEQUATE
Special Requests: ADEQUATE

## 2023-08-05 LAB — MYCOPHENOLIC ACID (CELLCEPT)
MPA Glucuronide: 149 ug/mL — ABNORMAL HIGH (ref 15–125)
MPA: 0.8 ug/mL — ABNORMAL LOW (ref 1.0–3.5)

## 2023-08-05 LAB — CBC
HCT: 22.7 % — ABNORMAL LOW (ref 39.0–52.0)
Hemoglobin: 7.6 g/dL — ABNORMAL LOW (ref 13.0–17.0)
MCH: 25.4 pg — ABNORMAL LOW (ref 26.0–34.0)
MCHC: 33.5 g/dL (ref 30.0–36.0)
MCV: 75.9 fL — ABNORMAL LOW (ref 80.0–100.0)
Platelets: 195 10*3/uL (ref 150–400)
RBC: 2.99 MIL/uL — ABNORMAL LOW (ref 4.22–5.81)
RDW: 14.6 % (ref 11.5–15.5)
WBC: 11 10*3/uL — ABNORMAL HIGH (ref 4.0–10.5)
nRBC: 0 % (ref 0.0–0.2)

## 2023-08-05 LAB — RENAL FUNCTION PANEL
Albumin: 2.6 g/dL — ABNORMAL LOW (ref 3.5–5.0)
Anion gap: 11 (ref 5–15)
BUN: 46 mg/dL — ABNORMAL HIGH (ref 6–20)
CO2: 23 mmol/L (ref 22–32)
Calcium: 7.6 mg/dL — ABNORMAL LOW (ref 8.9–10.3)
Chloride: 101 mmol/L (ref 98–111)
Creatinine, Ser: 11.57 mg/dL — ABNORMAL HIGH (ref 0.61–1.24)
GFR, Estimated: 5 mL/min — ABNORMAL LOW (ref >60)
Glucose, Bld: 142 mg/dL — ABNORMAL HIGH (ref 70–99)
Phosphorus: 3.8 mg/dL (ref 2.5–4.6)
Potassium: 3.6 mmol/L (ref 3.5–5.1)
Sodium: 135 mmol/L (ref 135–145)

## 2023-08-05 LAB — MAGNESIUM: Magnesium: 1.7 mg/dL (ref 1.7–2.4)

## 2023-08-05 NOTE — Plan of Care (Signed)
  Problem: Coping: Goal: Ability to adjust to condition or change in health will improve Outcome: Progressing   Problem: Fluid Volume: Goal: Ability to maintain a balanced intake and output will improve Outcome: Progressing   Problem: Education: Goal: Knowledge of General Education information will improve Description: Including pain rating scale, medication(s)/side effects and non-pharmacologic comfort measures Outcome: Progressing   Problem: Health Behavior/Discharge Planning: Goal: Ability to manage health-related needs will improve Outcome: Progressing   Problem: Clinical Measurements: Goal: Will remain free from infection Outcome: Progressing Goal: Diagnostic test results will improve Outcome: Progressing   Problem: Activity: Goal: Risk for activity intolerance will decrease Outcome: Progressing   Problem: Nutrition: Goal: Adequate nutrition will be maintained Outcome: Progressing

## 2023-08-05 NOTE — Progress Notes (Signed)
  Daily Progress Note  S/p:n/a  Subjective: No complaints  Objective: Vitals:   08/04/23 2303 08/05/23 0546  BP: (!) 149/95 (!) 156/92  Pulse: (!) 103 100  Resp: 19 15  Temp: 98.6 F (37 C) 98.9 F (37.2 C)  SpO2: 98% 97%    Physical Examination Alert and oriented HDS No edema Palpable radials bilaterally   ASSESSMENT/PLAN:  47 year old male presenting with ESRD worsening symptoms.  A temporary HD catheter was placed by IR.  Planning for conversion to [facility] and AV graft creation with Dr. Karin Lieu.   NPO midnight Consent ordered   Daria Pastures MD [facility] [phone] 08/05/2023  8:34 AM

## 2023-08-05 NOTE — Plan of Care (Signed)

## 2023-08-05 NOTE — Assessment & Plan Note (Addendum)
Did not need anti-emetic PRN overnight. No vomiting noted by patient

## 2023-08-05 NOTE — Plan of Care (Signed)
  Problem: Coping: Goal: Ability to adjust to condition or change in health will improve Outcome: Progressing   Problem: Fluid Volume: Goal: Ability to maintain a balanced intake and output will improve Outcome: Progressing   Problem: Metabolic: Goal: Ability to maintain appropriate glucose levels will improve Outcome: Progressing   Problem: Skin Integrity: Goal: Risk for impaired skin integrity will decrease Outcome: Progressing   Problem: Tissue Perfusion: Goal: Adequacy of tissue perfusion will improve Outcome: Progressing   Problem: Clinical Measurements: Goal: Ability to maintain clinical measurements within normal limits will improve Outcome: Progressing Goal: Will remain free from infection Outcome: Progressing

## 2023-08-05 NOTE — Progress Notes (Signed)
Patient ID: Adam Davis, male   DOB: [birth year], 47 y.o.   MRN: [id number] [facility] Progress Note   Assessment/ Plan:   1.  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  Symptomatically improved/resolved 2. ESRD: With failed deceased donor renal transplant that was functional for the past 18 years; now status post 3 dialysis treatments.  Temporary dialysis catheter was placed by interventional radiology (tunneled not placed because of leukocytosis) on 12/4 to start dialysis.  Plans for possible conversion to [facility] and permanent access placement tomorrow based on preoperative assessment.  Will order for dialysis later in the day.  Process initiated for outpatient dialysis unit placement [facility]). 3. Anemia: Likely secondary to chronic disease including chronic kidney disease.  On ESA with adequate iron stores. 4. CKD-MBD: Significant hyperphosphatemia noted with low calcium level; phosphorus level now at goal.  Continue calcium acetate 3 times a day for phosphorus binding. 5.  Hypokalemia: Corrected with oral supplementation/dialysis. 6.  Hypocalcemia: Improved with intravenous calcium supplementation and oral calcium based phosphorus binder. 7. Hypertension: Improving with hemodialysis and resumption of oral antihypertensive therapy.  Euvolemic.  Subjective:   Without acute events overnight, feeling a little fatigued this morning.   Objective:   BP (!) 156/92 (BP Location: Right Arm)   Pulse 100   Temp 98.9 F (37.2 C) (Oral)   Resp 15   Ht 5\' 6"  (1.676 m)   Wt (S) 67.8 kg Comment: Bed Scale  SpO2 97%   BMI 24.13 kg/m   Physical Exam: Gen: Sleeping restfully in bed, awakens to conversation CVS: Pulse regular tachycardia, S1 and S2 normal.  Right IJ temporary dialysis catheter Resp: Anteriorly clear to auscultation, no rales/rhonchi Abd: Soft, flat, nontender, bowel sounds normal Ext: No lower extremity  edema.  Scar from previously ligated right RCF noted  Labs: BMET Recent Labs  Lab 07/31/23 1632 08/01/23 0352 08/01/23 1013 08/02/23 0545 08/03/23 0500 08/04/23 0515 08/05/23 0542  NA 135 136 133* 135 134* 134* 135  K 2.8* 2.6* 3.2* 2.8* 2.9* 4.0 3.6  CL 98 102 102 101 101 101 101  CO2 14* 14* 15* 19* 23 23 23   GLUCOSE 102* 41* 96 112* 142* 147* 142*  BUN 172* 171* 169* 109* 65* 36* 46*  CREATININE 26.24* 24.50* 24.50* 18.21* 12.79* 8.97* 11.57*  CALCIUM 5.4* 5.7* 5.6* 5.8* 5.9* 7.0* 7.6*  PHOS  --  9.0* 8.7* 5.0* 3.9 2.9 3.8   CBC Recent Labs  Lab 07/31/23 1632 08/01/23 0352 08/02/23 0545 08/03/23 0500 08/04/23 0515 08/05/23 0542  WBC 13.2*   < > 12.4* 9.2 11.0* 11.0*  NEUTROABS 10.2*  --  9.4*  --   --   --   HGB 7.1*   < > 7.6* 7.3* 7.8* 7.6*  HCT 20.5*   < > 22.0* 21.5* 23.2* 22.7*  MCV 70.9*   < > 72.6* 73.4* 74.8* 75.9*  PLT 209   < > 174 160 189 195   < > = values in this interval not displayed.    Medications:     sodium chloride   Intravenous Once   amLODipine  10 mg Oral Daily   calcitRIOL  0.25 mcg Oral Daily   calcium acetate  1,334 mg Oral TID WC   Chlorhexidine Gluconate Cloth  6 each Topical Q0600   darbepoetin (ARANESP) injection - NON-DIALYSIS  40 mcg Subcutaneous Q Wed-1800   heparin  5,000 Units Subcutaneous Q8H   insulin aspart  0-6 Units  Subcutaneous TID WC   melatonin  3 mg Oral QHS   mycophenolate  250 mg Oral BID   tacrolimus  1 mg Oral BID   Zetta Bills, MD 08/05/2023, 8:21 AM

## 2023-08-05 NOTE — Progress Notes (Signed)
Mobility Specialist Progress Note    08/05/23 1505  Mobility  Activity Ambulated independently in hallway  Level of Assistance Independent  Assistive Device None  Distance Ambulated (ft) 420 ft  Activity Response Tolerated well  Mobility Referral Yes  Mobility visit 1 Mobility  Mobility Specialist Start Time (ACUTE ONLY) 1455  Mobility Specialist Stop Time (ACUTE ONLY) 1504  Mobility Specialist Time Calculation (min) (ACUTE ONLY) 9 min   Pre-Mobility: 108 HR During Mobility: 116 HR Post-Mobility: 115 HR  Pt received sitting EOB and agreeable. No complaints on walk. Returned to room with call bell in reach.   [place] Mobility Specialist  Please Neurosurgeon or Rehab Office at [phone]

## 2023-08-05 NOTE — Anesthesia Preprocedure Evaluation (Signed)
Anesthesia Evaluation  Patient identified by MRN, date of birth, ID band Patient awake    Reviewed: Allergy & Precautions, NPO status , Patient's Chart, lab work & pertinent test results  Airway Mallampati: III  TM Distance: >3 FB Neck ROM: Full    Dental no notable dental hx. (+) Dental Advisory Given   Pulmonary    Pulmonary exam normal breath sounds clear to auscultation       Cardiovascular hypertension, Pt. on medications  Rhythm:Regular Rate:Tachycardia     Neuro/Psych    GI/Hepatic   Endo/Other  diabetes    Renal/GU ESRFRenal disease     Musculoskeletal   Abdominal   Peds  Hematology  (+) Blood dyscrasia, anemia   Anesthesia Other Findings   Reproductive/Obstetrics                             Anesthesia Physical Anesthesia Plan  ASA: 3  Anesthesia Plan: General   Post-op Pain Management: Tylenol PO (pre-op)*   Induction: Intravenous  PONV Risk Score and Plan: 2 and Treatment may vary due to age or medical condition, Midazolam and Ondansetron  Airway Management Planned: LMA  Additional Equipment:   Intra-op Plan:   Post-operative Plan: Extubation in OR  Informed Consent: I have reviewed the patients History and Physical, chart, labs and discussed the procedure including the risks, benefits and alternatives for the proposed anesthesia with the patient or authorized representative who has indicated his/her understanding and acceptance.     Dental advisory given  Plan Discussed with: CRNA  Anesthesia Plan Comments:        Anesthesia Quick Evaluation

## 2023-08-06 ENCOUNTER — Other Ambulatory Visit (HOSPITAL_COMMUNITY): Payer: Self-pay

## 2023-08-06 ENCOUNTER — Inpatient Hospital Stay (HOSPITAL_COMMUNITY): Payer: BC Managed Care – PPO

## 2023-08-06 ENCOUNTER — Inpatient Hospital Stay (HOSPITAL_COMMUNITY): Payer: BC Managed Care – PPO | Admitting: Certified Registered"

## 2023-08-06 ENCOUNTER — Encounter (HOSPITAL_COMMUNITY): Payer: Self-pay | Admitting: Family Medicine

## 2023-08-06 ENCOUNTER — Encounter (HOSPITAL_COMMUNITY)
Admission: EM | Disposition: A | Discharge status: Home / Self Care | Payer: Self-pay | Source: Home / Self Care | Attending: Family Medicine

## 2023-08-06 ENCOUNTER — Other Ambulatory Visit: Payer: Self-pay

## 2023-08-06 ENCOUNTER — Other Ambulatory Visit: Payer: Self-pay | Admitting: Family Medicine

## 2023-08-06 ENCOUNTER — Telehealth: Payer: Self-pay | Admitting: Family Medicine

## 2023-08-06 DIAGNOSIS — N186 End stage renal disease: Secondary | ICD-10-CM | POA: Diagnosis not present

## 2023-08-06 DIAGNOSIS — N185 Chronic kidney disease, stage 5: Secondary | ICD-10-CM | POA: Diagnosis not present

## 2023-08-06 HISTORY — PX: INSERTION OF DIALYSIS CATHETER: SHX1324

## 2023-08-06 LAB — CBC
HCT: 23.2 % — ABNORMAL LOW (ref 39.0–52.0)
Hemoglobin: 7.6 g/dL — ABNORMAL LOW (ref 13.0–17.0)
MCH: 24.8 pg — ABNORMAL LOW (ref 26.0–34.0)
MCHC: 32.8 g/dL (ref 30.0–36.0)
MCV: 75.6 fL — ABNORMAL LOW (ref 80.0–100.0)
Platelets: 193 K/uL (ref 150–400)
RBC: 3.07 MIL/uL — ABNORMAL LOW (ref 4.22–5.81)
RDW: 14.3 % (ref 11.5–15.5)
WBC: 11.3 K/uL — ABNORMAL HIGH (ref 4.0–10.5)
nRBC: 0 % (ref 0.0–0.2)

## 2023-08-06 LAB — RENAL FUNCTION PANEL
Albumin: 2.6 g/dL — ABNORMAL LOW (ref 3.5–5.0)
Anion gap: 14 (ref 5–15)
BUN: 61 mg/dL — ABNORMAL HIGH (ref 6–20)
CO2: 21 mmol/L — ABNORMAL LOW (ref 22–32)
Calcium: 7.6 mg/dL — ABNORMAL LOW (ref 8.9–10.3)
Chloride: 97 mmol/L — ABNORMAL LOW (ref 98–111)
Creatinine, Ser: 13.31 mg/dL — ABNORMAL HIGH (ref 0.61–1.24)
GFR, Estimated: 4 mL/min — ABNORMAL LOW
Glucose, Bld: 174 mg/dL — ABNORMAL HIGH (ref 70–99)
Phosphorus: 4.6 mg/dL (ref 2.5–4.6)
Potassium: 3.5 mmol/L (ref 3.5–5.1)
Sodium: 132 mmol/L — ABNORMAL LOW (ref 135–145)

## 2023-08-06 LAB — MAGNESIUM: Magnesium: 1.7 mg/dL (ref 1.7–2.4)

## 2023-08-06 LAB — TYPE AND SCREEN
ABO/RH(D): A POS
Antibody Screen: NEGATIVE

## 2023-08-06 LAB — GLUCOSE, CAPILLARY
Glucose-Capillary: 130 mg/dL — ABNORMAL HIGH (ref 70–99)
Glucose-Capillary: 174 mg/dL — ABNORMAL HIGH (ref 70–99)
Glucose-Capillary: 161 mg/dL — ABNORMAL HIGH (ref 70–99)
Glucose-Capillary: 167 mg/dL — ABNORMAL HIGH (ref 70–99)
Glucose-Capillary: 180 mg/dL — ABNORMAL HIGH (ref 70–99)
Glucose-Capillary: 289 mg/dL — ABNORMAL HIGH (ref 70–99)
Glucose-Capillary: 333 mg/dL — ABNORMAL HIGH (ref 70–99)

## 2023-08-06 SURGERY — INSERTION OF DIALYSIS CATHETER
Anesthesia: General | Site: Chest | Laterality: Right

## 2023-08-06 MED ORDER — HYDROCODONE-ACETAMINOPHEN 5-325 MG PO TABS
1.0000 | ORAL_TABLET | ORAL | Status: DC | PRN
  Administered 2023-08-06: 1 via ORAL
  Filled 2023-08-06: qty 1

## 2023-08-06 MED ORDER — HEPARIN SODIUM (PORCINE) 1000 UNIT/ML IJ SOLN
INTRAMUSCULAR | Status: AC
  Filled 2023-08-06: qty 10

## 2023-08-06 MED ORDER — TACROLIMUS 0.5 MG PO CAPS
0.5000 mg | ORAL_CAPSULE | Freq: Two times a day (BID) | ORAL | Status: DC
  Administered 2023-08-06 – 2023-08-07 (×3): 0.5 mg via ORAL
  Filled 2023-08-06 (×4): qty 1

## 2023-08-06 MED ORDER — CALCIUM ACETATE (PHOS BINDER) 667 MG PO CAPS
1334.0000 mg | ORAL_CAPSULE | Freq: Three times a day (TID) | ORAL | 0 refills | Status: DC
  Filled 2023-08-06: qty 180, 30d supply, fill #0

## 2023-08-06 MED ORDER — ONDANSETRON HCL 4 MG/2ML IJ SOLN
INTRAMUSCULAR | Status: DC | PRN
  Administered 2023-08-06: 4 mg via INTRAVENOUS

## 2023-08-06 MED ORDER — ACETAMINOPHEN 325 MG PO TABS: 650.0000 mg | ORAL_TABLET | Freq: Four times a day (QID) | ORAL | Status: DC | PRN

## 2023-08-06 MED ORDER — CALCITRIOL 0.25 MCG PO CAPS
0.2500 ug | ORAL_CAPSULE | Freq: Every day | ORAL | 0 refills | Status: DC
  Filled 2023-08-06: qty 30, 30d supply, fill #0

## 2023-08-06 MED ORDER — PHENYLEPHRINE 80 MCG/ML (10ML) SYRINGE FOR IV PUSH (FOR BLOOD PRESSURE SUPPORT)
PREFILLED_SYRINGE | INTRAVENOUS | Status: DC | PRN
  Administered 2023-08-06: 80 ug via INTRAVENOUS
  Administered 2023-08-06 (×2): 160 ug via INTRAVENOUS
  Administered 2023-08-06: 80 ug via INTRAVENOUS

## 2023-08-06 MED ORDER — FENTANYL CITRATE (PF) 100 MCG/2ML IJ SOLN
INTRAMUSCULAR | Status: AC
  Filled 2023-08-06: qty 2

## 2023-08-06 MED ORDER — HEPARIN 6000 UNIT IRRIGATION SOLUTION
Status: DC | PRN
  Administered 2023-08-06: 1

## 2023-08-06 MED ORDER — ORAL CARE MOUTH RINSE: 15.0000 mL | Freq: Once | OROMUCOSAL | Status: AC

## 2023-08-06 MED ORDER — MIDAZOLAM HCL 2 MG/2ML IJ SOLN
INTRAMUSCULAR | Status: DC | PRN
  Administered 2023-08-06: 2 mg via INTRAVENOUS

## 2023-08-06 MED ORDER — CHLORHEXIDINE GLUCONATE 0.12 % MT SOLN
15.0000 mL | Freq: Once | OROMUCOSAL | Status: AC
Start: 2023-08-06 — End: 2023-08-06
  Administered 2023-08-06: 15 mL via OROMUCOSAL
  Filled 2023-08-06: qty 15

## 2023-08-06 MED ORDER — INSULIN GLARGINE-YFGN 100 UNIT/ML ~~LOC~~ SOLN
4.0000 [IU] | Freq: Every day | SUBCUTANEOUS | Status: DC
  Administered 2023-08-06: 4 [IU] via SUBCUTANEOUS
  Filled 2023-08-06 (×2): qty 0.04

## 2023-08-06 MED ORDER — HEPARIN 6000 UNIT IRRIGATION SOLUTION
Status: AC
  Filled 2023-08-06: qty 500

## 2023-08-06 MED ORDER — FENTANYL CITRATE (PF) 250 MCG/5ML IJ SOLN
INTRAMUSCULAR | Status: DC | PRN
  Administered 2023-08-06 (×2): 50 ug via INTRAVENOUS

## 2023-08-06 MED ORDER — TACROLIMUS 0.5 MG PO CAPS
0.5000 mg | ORAL_CAPSULE | Freq: Two times a day (BID) | ORAL | 0 refills | Status: DC
  Filled 2023-08-06: qty 60, 30d supply, fill #0

## 2023-08-06 MED ORDER — RENA-VITE PO TABS
1.0000 | ORAL_TABLET | Freq: Every day | ORAL | 0 refills | Status: AC
  Filled 2023-08-06: qty 30, 30d supply, fill #0

## 2023-08-06 MED ORDER — HYDROCODONE-ACETAMINOPHEN 5-325 MG PO TABS
1.0000 | ORAL_TABLET | ORAL | 0 refills | Status: DC | PRN
  Filled 2023-08-06: qty 12, 2d supply, fill #0

## 2023-08-06 MED ORDER — LIDOCAINE HCL (PF) 1 % IJ SOLN
INTRAMUSCULAR | Status: AC
  Filled 2023-08-06: qty 30

## 2023-08-06 MED ORDER — FENTANYL CITRATE (PF) 100 MCG/2ML IJ SOLN
25.0000 ug | INTRAMUSCULAR | Status: DC | PRN
  Administered 2023-08-06: 25 ug via INTRAVENOUS

## 2023-08-06 MED ORDER — PROPOFOL 10 MG/ML IV BOLUS
INTRAVENOUS | Status: DC | PRN
  Administered 2023-08-06: 120 mg via INTRAVENOUS

## 2023-08-06 MED ORDER — CEFAZOLIN SODIUM-DEXTROSE 2-3 GM-%(50ML) IV SOLR
INTRAVENOUS | Status: DC | PRN
  Administered 2023-08-06: 2 g via INTRAVENOUS

## 2023-08-06 MED ORDER — SODIUM CHLORIDE 0.9 % IV SOLN: INTRAVENOUS | Status: DC

## 2023-08-06 MED ORDER — LIDOCAINE 2% (20 MG/ML) 5 ML SYRINGE
INTRAMUSCULAR | Status: DC | PRN
  Administered 2023-08-06: 75 mg via INTRAVENOUS

## 2023-08-06 MED ORDER — LIDOCAINE 2% (20 MG/ML) 5 ML SYRINGE
INTRAMUSCULAR | Status: AC
  Filled 2023-08-06: qty 5

## 2023-08-06 MED ORDER — INSULIN ASPART 100 UNIT/ML IJ SOLN
4.0000 [IU] | Freq: Once | INTRAMUSCULAR | Status: AC
  Administered 2023-08-06: 4 [IU] via SUBCUTANEOUS

## 2023-08-06 MED ORDER — FENTANYL CITRATE (PF) 250 MCG/5ML IJ SOLN
INTRAMUSCULAR | Status: AC
  Filled 2023-08-06: qty 5

## 2023-08-06 MED ORDER — MIDAZOLAM HCL 2 MG/2ML IJ SOLN
INTRAMUSCULAR | Status: AC
  Filled 2023-08-06: qty 2

## 2023-08-06 MED ORDER — MYCOPHENOLATE MOFETIL 250 MG PO CAPS
250.0000 mg | ORAL_CAPSULE | Freq: Two times a day (BID) | ORAL | 0 refills | Status: DC
  Filled 2023-08-06: qty 60, 30d supply, fill #0

## 2023-08-06 MED ORDER — LIDOCAINE-EPINEPHRINE (PF) 1 %-1:200000 IJ SOLN
INTRAMUSCULAR | Status: AC
  Filled 2023-08-06: qty 30

## 2023-08-06 MED ORDER — SODIUM CHLORIDE 0.9 % IV SOLN: INTRAVENOUS | Status: DC | PRN

## 2023-08-06 MED ORDER — DROPERIDOL 2.5 MG/ML IJ SOLN: 0.6250 mg | Freq: Once | INTRAMUSCULAR | Status: DC | PRN

## 2023-08-06 MED ORDER — PROPOFOL 10 MG/ML IV BOLUS
INTRAVENOUS | Status: AC
  Filled 2023-08-06: qty 20

## 2023-08-06 MED ORDER — DARBEPOETIN ALFA 100 MCG/0.5ML IJ SOSY: 100.0000 ug | PREFILLED_SYRINGE | INTRAMUSCULAR | Status: DC

## 2023-08-06 MED ORDER — HEPARIN SODIUM (PORCINE) 1000 UNIT/ML IJ SOLN
INTRAMUSCULAR | Status: DC | PRN
  Administered 2023-08-06: 3200 [IU]

## 2023-08-06 SURGICAL SUPPLY — 45 items
ARMBAND PINK RESTRICT EXTREMIT (MISCELLANEOUS) ×2 IMPLANT
BAG COUNTER SPONGE SURGICOUNT (BAG) ×2 IMPLANT
BAG DECANTER FOR FLEXI CONT (MISCELLANEOUS) ×2 IMPLANT
BIOPATCH RED 1 DISK 7.0 (GAUZE/BANDAGES/DRESSINGS) ×2 IMPLANT
BLADE CLIPPER SURG (BLADE) ×2 IMPLANT
BNDG ELASTIC 4X5.8 VLCR STR LF (GAUZE/BANDAGES/DRESSINGS) ×2 IMPLANT
CANISTER SUCT 3000ML PPV (MISCELLANEOUS) ×2 IMPLANT
CATH PALINDROME-P 19CM W/VT (CATHETERS) ×1 IMPLANT
CATH PALINDROME-P 23CM W/VT (CATHETERS) IMPLANT
CATH PALINDROME-P 28CM W/VT (CATHETERS) IMPLANT
CATH STRAIGHT 5FR 65CM (CATHETERS) IMPLANT
COVER PROBE W GEL 5X96 (DRAPES) ×2 IMPLANT
COVER SURGICAL LIGHT HANDLE (MISCELLANEOUS) ×2 IMPLANT
DERMABOND ADVANCED .7 DNX12 (GAUZE/BANDAGES/DRESSINGS) ×2 IMPLANT
DERMABOND ADVANCED .7 DNX6 (GAUZE/BANDAGES/DRESSINGS) ×1 IMPLANT
DRAPE CHEST BREAST 15X10 FENES (DRAPES) ×2 IMPLANT
ELECT REM PT RETURN 9FT ADLT (ELECTROSURGICAL) ×2
ELECTRODE REM PT RTRN 9FT ADLT (ELECTROSURGICAL) ×2 IMPLANT
GAUZE 4X4 16PLY ~~LOC~~+RFID DBL (SPONGE) ×2 IMPLANT
GLIDEWIRE ADV .035X180CM (WIRE) ×1 IMPLANT
GLOVE BIOGEL PI IND STRL 8 (GLOVE) ×2 IMPLANT
GOWN STRL REUS W/ TWL LRG LVL3 (GOWN DISPOSABLE) ×4 IMPLANT
GOWN STRL REUS W/TWL 2XL LVL3 (GOWN DISPOSABLE) ×4 IMPLANT
KIT BASIN OR (CUSTOM PROCEDURE TRAY) ×2 IMPLANT
KIT PALINDROME-P 55CM (CATHETERS) IMPLANT
KIT TURNOVER KIT B (KITS) ×2 IMPLANT
NDL HYPO 25GX1X1/2 BEV (NEEDLE) ×1 IMPLANT
NEEDLE HYPO 25GX1X1/2 BEV (NEEDLE) ×2 IMPLANT
NS IRRIG 1000ML POUR BTL (IV SOLUTION) ×2 IMPLANT
PACK SRG BSC III STRL LF ECLPS (CUSTOM PROCEDURE TRAY) ×2 IMPLANT
PAD ARMBOARD 7.5X6 YLW CONV (MISCELLANEOUS) ×4 IMPLANT
SET MICROPUNCTURE 5F STIFF (MISCELLANEOUS) ×1 IMPLANT
SOAP 2 % CHG 4 OZ (WOUND CARE) ×2 IMPLANT
SPIKE FLUID TRANSFER (MISCELLANEOUS) ×2 IMPLANT
SUT ETHILON 3 0 PS 1 (SUTURE) ×2 IMPLANT
SUT MNCRL AB 4-0 PS2 18 (SUTURE) ×2 IMPLANT
SYR 10ML LL (SYRINGE) ×2 IMPLANT
SYR 20ML LL LF (SYRINGE) ×4 IMPLANT
SYR 5ML LL (SYRINGE) ×2 IMPLANT
SYR CONTROL 10ML LL (SYRINGE) ×2 IMPLANT
TOWEL GREEN STERILE (TOWEL DISPOSABLE) ×2 IMPLANT
TOWEL GREEN STERILE FF (TOWEL DISPOSABLE) ×2 IMPLANT
UNDERPAD 30X36 HEAVY ABSORB (UNDERPADS AND DIAPERS) ×2 IMPLANT
WATER STERILE IRR 1000ML POUR (IV SOLUTION) ×2 IMPLANT
WIRE AMPLATZ SS-J .035X180CM (WIRE) IMPLANT

## 2023-08-06 NOTE — Progress Notes (Signed)
CCC Pre-op Review  Pre-op checklist:   COMPLETED  NPO:  all since MN  Labs:    cbg 130  T&S done  Na 132  k 3.5  glu 174   BUN 61  Cr 13.31  ca 7.6   alb 2.6  WBC 11.3   *H/H 7.6 / 23.2 *    PLTS 193  Consent:  informed  H&P:   NEEDS  INTERVAL NOTE  Vitals:  @0615   98.4   p99  r 19  140/97(108)                    99% RA    O2 requirements:    RA  MAR/PTA review: * INSULIN HELD THIS AM   IV:    20g R AC    20g LFA  Floor nurse name:  Hendricks Milo RN  Additional info:    DM    EDRD /SP CADAVER TRANSPLANT AFTER 18 YRS HTN  HLD    ANEMIA/CHRONIC DZ

## 2023-08-06 NOTE — Transfer of Care (Signed)
Immediate Anesthesia Transfer of Care Note  Patient: Adam Davis  Procedure(s) Performed: INSERTION OF TUNNELED DIALYSIS CATHETER, RIGHT INTERNAL JUGULAR (Right: Chest)  Patient Location: PACU  Anesthesia Type:General  Level of Consciousness: drowsy and patient cooperative  Airway & Oxygen Therapy: Patient Spontanous Breathing  Post-op Assessment: Report given to RN and Post -op Vital signs reviewed and stable  Post vital signs: Reviewed and stable  Last Vitals:  Vitals Value Taken Time  BP 146/93 08/06/23 1140  Temp    Pulse 96 08/06/23 1143  Resp 20 08/06/23 1143  SpO2 96 % 08/06/23 1143  Vitals shown include unfiled device data.  Last Pain:  Vitals:   08/06/23 1013  TempSrc: Oral  PainSc: 0-No pain         Complications: No notable events documented.

## 2023-08-06 NOTE — Discharge Planning (Signed)
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 at [facility], CKD stage V, diabetes mellitus, HTN, and HLD  Discharge diagnosis: CKD V now progressed to ESRD Failed renal transplant - on cellcept and prograf   Allergies:  Allergies  Allergen Reactions   Atorvastatin Other (See Comments)    Myalgia - resolved with discontinuation    Date of First Dialysis: 08/01/23 Cause of renal disease: Failed deceased donor renal transplant  Dialysis Prescription: Dialysis Frequency: TTS Tx duration: 4hrs BFR: 400 DFR: 600 EDW: 68kg  Dialyzer: 180NRe UF profile/Sodium modeling?: None Dialysis Bath: 3 K 2.5 Ca  Dialysis access: Please note: Patient with a temporary HD catheter now BUT plan for conversion to [facility] with L AVG today 08/06/23!!!!  In Center Medications: Heparin Dose: No Heparin bolus for now . Will re-visit this once he gets to outpatient VDRA: Calcitriol with HD per protocol Venofer: per protocol Mircera: every 2 weeks Next dose due: 08/08/23 Patient received 1 unit PRBCs on 08/01/23  Discharge labs: From 08/06/23 Hgb: 7.6   K+: 3.5   Ca: 7.6 Phos: 4.6 Alb: 2.6  Please draw routine labs. Additional labs needed: Check weekly K+ levels while on 3K bath  Salome Holmes, NP

## 2023-08-06 NOTE — Assessment & Plan Note (Signed)
Hemoglobin this morning 7.6, transfusion threshold <7. - Aranesp per nephro -Post HD/postsurgical H&H this p.m. -AM CBC

## 2023-08-06 NOTE — Assessment & Plan Note (Signed)
Receiving dialysis with nephrology while inpatient, s/p failed deceased donor renal transplant.  Plan for left arm AVG surgery with BBS today.  Patient will also have HD session following AVG. - Nephrology following, appreciate recommendations - Plan for conversion to [facility] with left arm AVG today - Multiple electrolyte abnormalities likely 2/2 to ESRD - supplement as needed per nephrology (calcitriol 0.25 mcg daily, PhosLo 3 times daily) - immunosuppressive regimen with slow taper per nephrology  - AM RFP, mag - avoid nephrotoxic medications

## 2023-08-06 NOTE — Progress Notes (Addendum)
Progress Note    08/06/2023 7:52 AM Day of Surgery  Subjective:  no complaints   Vitals:   08/06/23 0615 08/06/23 0734  BP: (!) 140/97   Pulse:  (!) 109  Resp: 19   Temp: 98.4 F (36.9 C) 99 F (37.2 C)  SpO2: 99% 100%   Physical Exam: Lungs:  non labored Extremities:  palpable L radial pulse Neurologic: A&O  CBC    Component Value Date/Time   WBC 11.3 (H) 08/06/2023 0538   RBC 3.07 (L) 08/06/2023 0538   HGB 7.6 (L) 08/06/2023 0538   HGB 14.5 02/06/2017 1648   HCT 23.2 (L) 08/06/2023 0538   HCT 41.5 02/06/2017 1648   PLT 193 08/06/2023 0538   PLT 171 02/06/2017 1648   MCV 75.6 (L) 08/06/2023 0538   MCV 73 (L) 02/06/2017 1648   MCH 24.8 (L) 08/06/2023 0538   MCHC 32.8 08/06/2023 0538   RDW 14.3 08/06/2023 0538   RDW 14.8 02/06/2017 1648   LYMPHSABS 1.2 08/02/2023 0545   LYMPHSABS 2.0 02/06/2017 1648   MONOABS 1.5 (H) 08/02/2023 0545   EOSABS 0.1 08/02/2023 0545   EOSABS 0.1 02/06/2017 1648   BASOSABS 0.0 08/02/2023 0545   BASOSABS 0.0 02/06/2017 1648    BMET    Component Value Date/Time   NA 132 (L) 08/06/2023 0538   NA 140 11/07/2022 1700   K 3.5 08/06/2023 0538   CL 97 (L) 08/06/2023 0538   CO2 21 (L) 08/06/2023 0538   GLUCOSE 174 (H) 08/06/2023 0538   BUN 61 (H) 08/06/2023 0538   BUN 36 (H) 11/07/2022 1700   CREATININE 13.31 (H) 08/06/2023 0538   CREATININE 1.82 (H) 04/02/2013 1418   CALCIUM 7.6 (L) 08/06/2023 0538   CALCIUM 9.5 10/20/2010 2227   GFRNONAA 4 (L) 08/06/2023 0538   GFRAA 68 09/19/2019 1427    INR    Component Value Date/Time   INR 1.2 07/31/2023 1939     Intake/Output Summary (Last 24 hours) at 08/06/2023 0752 Last data filed at 08/06/2023 7829 Gross per 24 hour  Intake --  Output 725 ml  Net -725 ml     Assessment/Plan:  47 y.o. male with ESRD Day of Surgery   Plan is for L arm AV G vs F and TDC placement today.  All questions answered and agreeable to proceed.  Continue npo.  Consent ordered   Emilie Rutter, PA-C [facility] [phone] 08/06/2023 7:52 AM   VASCULAR STAFF ADDENDUM: I have independently interviewed and examined the patient. I agree with the above.  Patient is a 47 year old male with end-stage renal disease requiring long-term HD access.  Vein mapping was reviewed demonstrating adequate right sided cephalic vein, likely left-sided AV graft.  I had a long conversation with Arnold regarding the above.  Unfortunately, he had an IV in his right cephalic vein.  We discussed AV graft versus AV fistula creation.  He would like an AV fistula as his previous fistula worked well, without issues for years.  We discussed tunneled dialysis catheter placement today, with plans to return to the OR in the coming weeks for right arm fistula creation as I could not promise that left-sided access would be a fistula.  In all likelihood, it would be a graft due to the size of the superficial veins.   After discussing the risks and benefits of tunneled dialysis catheter placement, Neven elected to proceed. I have already reached out to my office for scheduling right sided fistula creation.  Victorino Sparrow MD [facility] Phone Number: [phone] 08/06/2023 10:31 AM

## 2023-08-06 NOTE — Progress Notes (Signed)
Out Patient Arrangements:  Pt has been accepted at [facility] on TTS 10:45 am chair time. Pt can start on Tuesday, Dec 10 and will need to arrive at 9:50 am to complete paperwork prior to treatment. Per French Ana Mounce's note pt & wife agreeable  to plan. Will assist as needed.   Andree Elk HPSS [phone]

## 2023-08-06 NOTE — Progress Notes (Signed)
     Daily Progress Note Intern Pager: [phone]  Patient name: Adam Davis Medical record number: [id number] Date of birth: 1976/04/08 Age: 47 y.o. Gender: male  Primary Care Provider: Program, [facility] Consultants: Nephrology Code Status: Full  Pt Overview and Major Events to Date:  /3: Admitted to FM TS 12/4: Transfuse 1 unit PRBC 12/4-6: HD initiated  Assessment and Plan:  Adam Davis is a 47 year old male admitted for progression of CKD 5 to ESRD.  He will undergo AVG today, and has placement for outpatient dialysis.  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  He will undergo hemodialysis today following AVG procedure. Assessment & Plan ESRD (end stage renal disease) (HCC) Receiving dialysis with nephrology while inpatient, s/p failed deceased donor renal transplant.  Plan for left arm AVG surgery with BBS today.  Patient will also have HD session following AVG. - Nephrology following, appreciate recommendations - Plan for conversion to [facility] with left arm AVG today - Multiple electrolyte abnormalities likely 2/2 to ESRD - supplement as needed per nephrology (calcitriol 0.25 mcg daily, PhosLo 3 times daily) - immunosuppressive regimen with slow taper per nephrology  - AM RFP, mag - avoid nephrotoxic medications  Anemia Hemoglobin this morning 7.6, transfusion threshold <7. - Aranesp per nephro -Post HD/postsurgical H&H this p.m. -AM CBC Nausea & vomiting Did not need anti-emetic PRN overnight. No vomiting noted by patient   Chronic and Stable Problems:  DM type II: Very sensitive SSI, consider adding LAI post surgery HTN: Continue Norvasc 10 mg, hold Lasix and spironolactone   FEN/GI: Renal diet with 1200 mL fluid restriction PPx: Heparin subcu every 8 hours Dispo:Home  today or tomorrow . Barriers include outpatient dialysis placement.   Subjective:  Patient was awake with wife  at bedside on exam this morning.  He has no complaints today but did have questions about his surgery.  Explained that nephrology would work on his HD schedule based on his needs following AVG placement today.  Objective: Temp:  [98.3 F (36.8 C)-99.2 F (37.3 C)] 98.4 F (36.9 C) (12/09 0615) Pulse Rate:  [99-100] 99 (12/08 1500) Resp:  [16-19] 19 (12/09 0615) BP: (138-152)/(89-99) 140/97 (12/09 0615) SpO2:  [96 %-99 %] 99 % (12/09 0615) Physical Exam: General: Alert, oriented.  No apparent distress Cardiovascular: RRR, no M/R/G Respiratory: CTAB, no increased work of breathing Abdomen: Flat, soft, nontender. Extremities: 2+ pulses in the bilateral upper extremities.  Laboratory: Most recent CBC Lab Results  Component Value Date   WBC 11.3 (H) 08/06/2023   HGB 7.6 (L) 08/06/2023   HCT 23.2 (L) 08/06/2023   MCV 75.6 (L) 08/06/2023   PLT 193 08/06/2023   Most recent BMP    Latest Ref Rng & Units 08/06/2023    5:38 AM  BMP  Glucose 70 - 99 mg/dL 253   BUN 6 - 20 mg/dL 61   Creatinine 6.64 - 1.24 mg/dL 40.34   Sodium 742 - 595 mmol/L 132   Potassium 3.5 - 5.1 mmol/L 3.5   Chloride 98 - 111 mmol/L 97   CO2 22 - 32 mmol/L 21   Calcium 8.9 - 10.3 mg/dL 7.6     Gerrit Heck, DO 08/06/2023, 7:31 AM  PGY-1, [facility] FPTS Intern pager: [phone], text pages welcome Secure chat group [facility] Teaching Service

## 2023-08-06 NOTE — Plan of Care (Signed)

## 2023-08-06 NOTE — Discharge Summary (Shared)
Family Medicine Teaching [facility] Discharge Summary  Patient name: Adam Davis Medical record number: [id number] Date of birth: 09-25-1975 Age: 47 y.o. 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 His hospital course is detailed below:  AKI on CKD  Progression to ESRD in the setting of failed kidney transplant in 2006 with Cr 26.54 and BUN 172.  Nephrology was consulted and he was initiated on inpatient dialysis.  He had a right internal jugular catheter placed with IR, which was then replaced with tunneled cath prior to discharge. Patient tolerated HD well and was set up by renal navigator with outpatient HD. Patient will need to f/u with vascular for AVG placement in a few weeks.   Anemia Likely anemia of chronic disease in the setting of ESRD. Required 1u pRBC transfusion during his stay. Started on ESA.  T2DM Multiple episodes of rapid hypoglycemia, particularly when NPO.  BGL trended up as PO intake increased and he was manage with low-dose LAI and SSI. Patient was provided with blood glucose monitoring supplies prior to discharge, and will have close follow up with PCP.  Nausea Vomiting Assumed viral insult causing constellation of symptoms.  Patient had improvement  of symptoms during stay and required gentle fluids at the beginning of his admission.   Other chronic conditions were medically managed with home medications and formulary alternatives as necessary   PCP Follow-up Recommendations: Baloch as PCP--Please hange to her when patient arrives at [facility] Adjust LAI outpatient, discuss possible need for mealtime insulin Ensure nephro f/u - set up for HD starting 12/10 at [facility]. Will also need titration off post transplant meds with nephro   Disposition: home  Discharge Condition: stable, improved  Discharge Exam:  Vitals:   08/07/23 1316 08/07/23 1318  BP: (!) 144/93 (!) 148/96  Pulse: (!) 104 (!) 103  Resp: 19 (!) 24  Temp: 98.9 F (37.2 C)   SpO2: 100% 97%   Performed by Dr. Lonie Peak: General: Alert, oriented.  No obvious distress Cardiac: RRR, no M/R/G Respiratory: CTAB, no increased work of breathing Extremities: 2+ pulses x 4.  No signs of edema in the bilateral lower extremity.  Significant Procedures: temporary HD catheter placement (IR) 12/4, Hemodialysis initiated  Significant Labs and Imaging:  Recent Labs  Lab 08/06/23 0538 08/07/23 0911  WBC 11.3* 12.6*  HGB 7.6* 7.4*  HCT 23.2* 22.3*  PLT 193 205   Recent Labs  Lab 08/06/23 0538 08/07/23 0911  NA 132* 129*  K 3.5 3.4*  CL 97* 95*  CO2 21* 21*  GLUCOSE 174* 260*  BUN 61* 74*  CREATININE 13.31* 14.48*  CALCIUM 7.6* 7.5*  MG 1.7  --   PHOS 4.6  --   ALBUMIN 2.6*  --     Imaging: DG CHEST PORT 1 VIEW Result Date: 08/06/2023 IMPRESSION: 1. No complication after dialysis  catheter placement. 2. Enlarged cardiac silhouette.   VAS [place] UPPER EXT VEIN MAPPING (PRE-OP AVF) Result Date: 08/01/2023  +-----------------+-------------+----------+--------------------------+ Right Cephalic   Diameter (cm)Depth (cm)         Findings          +-----------------+-------------+----------+--------------------------+ Shoulder             0.40        0.47                               +-----------------+-------------+----------+--------------------------+ Prox upper arm       0.27        0.29           branching          +-----------------+-------------+----------+--------------------------+ Mid upper arm        0.29        0.25                              +-----------------+-------------+----------+--------------------------+ Dist upper arm       0.35        0.23                              +-----------------+-------------+----------+--------------------------+ Antecubital fossa    0.65        0.24                              +-----------------+-------------+----------+--------------------------+ Prox forearm                            not visualized and Bandage +-----------------+-------------+----------+--------------------------+ Mid forearm                                   not visualized       +-----------------+-------------+----------+--------------------------+ Dist forearm         0.32        0.14           Thrombosed         +-----------------+-------------+----------+--------------------------+ Wrist                1.22        0.14           Thrombosed         +-----------------+-------------+----------+--------------------------+ +-----------------+-------------+----------+--------+ Right Basilic    Diameter (cm)Depth (cm)Findings +-----------------+-------------+----------+--------+ Mid upper arm        0.27        0.45            +-----------------+-------------+----------+--------+ Dist upper arm       0.22        0.19            +-----------------+-------------+----------+--------+ Antecubital fossa    0.16        0.19            +-----------------+-------------+----------+--------+ Prox forearm         0.16        0.13            +-----------------+-------------+----------+--------+ Mid forearm          0.13        0.13            +-----------------+-------------+----------+--------+  Distal  forearm       0.12        0.15            +-----------------+-------------+----------+--------+ Wrist                0.10        0.16            +-----------------+-------------+----------+--------+ Distal artery thrombosed, previous h/o fistula. +-----------------+-------------+----------+--------+ Left Cephalic    Diameter (cm)Depth (cm)Findings +-----------------+-------------+----------+--------+ Shoulder             0.67        0.27            +-----------------+-------------+----------+--------+ Prox upper arm       0.07        0.22            +-----------------+-------------+----------+--------+ Mid upper arm        0.66        0.28            +-----------------+-------------+----------+--------+ Dist upper arm       0.11        0.11            +-----------------+-------------+----------+--------+ Antecubital fossa    0.12        0.10            +-----------------+-------------+----------+--------+ Prox forearm         0.13        0.22            +-----------------+-------------+----------+--------+ Mid forearm          0.11        0.17            +-----------------+-------------+----------+--------+ Dist forearm         0.07        0.26            +-----------------+-------------+----------+--------+ Wrist                0.07        0.17            +-----------------+-------------+----------+--------+ +-----------------+-------------+----------+---------+ Left Basilic     Diameter (cm)Depth (cm)Findings  +-----------------+-------------+----------+---------+ Mid upper arm        0.46        0.52             +-----------------+-------------+----------+---------+ Dist upper arm       0.22        0.18             +-----------------+-------------+----------+---------+ Antecubital fossa    0.23        0.15             +-----------------+-------------+----------+---------+ Prox forearm         0.21        0.10   branching  +-----------------+-------------+----------+---------+ Mid forearm          0.13        0.17             +-----------------+-------------+----------+---------+ Distal forearm       0.14        0.16             +-----------------+-------------+----------+---------+ Wrist                0.10        0.22             +-----------------+-------------+----------+---------+ *See table(s) above for measurements and observations.  Diagnosing physician: Coral Else MD Electronically signed by Coral Else MD on 08/01/2023 at 7:08:59 PM.    Final    IR Fluoro Guide CV Line Righ Result Date: 08/01/2023 IMPRESSION: Successful placement of a right internal jugular approach 15 cm temporary dialysis catheter with tip terminating with in the superior aspect of the right atrium. The catheter is ready for immediate use. PLAN: This catheter may be converted to a tunneled dialysis catheter at a later date as indicated.  IR US Guide Vasc Access Right Result Date: 08/01/2023 IMPRESSION: Successful placement of a right internal jugular approach 15 cm temporary dialysis catheter with tip terminating with in the superior aspect of the right atrium. The catheter is ready for immediate use. PLAN: This catheter may be converted to a tunneled dialysis catheter at a later date as indicated.  US Renal Transplant w/Doppler Result Date: 07/31/2023 IMPRESSION: 1. Left lower quadrant renal transplant. 2. Nonspecific increased parenchymal echotexture. 3. Resistive index in the lower pole measures 0.84, equivocal. 4. Normal resistive index in the upper pole. 5. No mass or hydronephrosis.   DG Chest 1 View Result Date: 07/31/2023 IMPRESSION: No active disease.  Results/Tests Pending at Time of Discharge: n/a  Discharge Medications:  Allergies as of 08/07/2023       Reactions   Atorvastatin Other (See Comments)   Myalgia - resolved with discontinuation        Medication List     STOP taking these medications     diclofenac 75 MG EC tablet Commonly known as: VOLTAREN   furosemide 40 MG tablet Commonly known as: LASIX   multivitamin Tabs tablet   ondansetron 4 MG tablet Commonly known as: ZOFRAN   predniSONE 20 MG tablet Commonly known as: DELTASONE   spironolactone 25 MG tablet Commonly known as: ALDACTONE       TAKE these medications    amLODipine 10 MG tablet Commonly known as: NORVASC Take 10 mg by mouth daily.   calcitRIOL 0.25 MCG capsule Commonly known as: ROCALTROL Take 1 capsule (0.25 mcg total) by mouth daily.   Darbepoetin Alfa 100 MCG/0.5ML Sosy injection Commonly known as: ARANESP Inject 0.5 mLs (100 mcg total) into the skin every Wednesday at 6 PM. Start taking on: August 08, 2023   HYDROcodone-acetaminophen 5-325 MG tablet Commonly known as: NORCO/VICODIN Take 1 tablet by mouth every 4 (four) hours as needed for moderate pain (pain score 4-6).   Insupen Pen Needles 32G X 4 MM Misc Generic drug: Insulin Pen Needle Use with Semglee pen   Lancet Device Misc 1 each by Does not apply route in the morning, at noon, and at bedtime. May substitute to any manufacturer covered by patient's insurance.   multivitamin Tabs tablet Take 1 tablet by mouth daily.   mycophenolate 250 MG capsule Commonly known as: CELLCEPT Take 1 capsule (250 mg total) by mouth 2 (two) times daily. What changed: how much to take   OneTouch Delica Plus Lancet33G Misc Use in the morning, at noon, and at bedtime.   OneTouch Verio Flex System w/Device Kit Use in the morning, at noon, and at bedtime.   OneTouch Verio test strip Generic drug: glucose blood Use in the morning, at noon, and at bedtime.   rosuvastatin 10 MG tablet Commonly known as: CRESTOR Take one tablet every Monday, Wednesday, and Friday. What changed: See the new instructions.   Semglee (yfgn) 100 UNIT/ML Pen Generic drug: insulin glargine-yfgn Inject 4 Units into the skin daily. Expires 28 days after first  use What changed:  how much to take additional instructions   sevelamer carbonate 800 MG tablet Commonly known as: Renvela Take 1 tablet (800 mg total) by mouth 3 (three) times daily with meals.   tacrolimus 0.5 MG capsule Commonly known as: PROGRAF Take 1 capsule (0.5 mg total) by mouth 2 (two) times daily. What changed:  medication strength how much to take         Discharge Instructions: Please refer to Patient Instructions section of EMR for full details.  Patient was counseled important signs and symptoms that should prompt return to medical care, changes in medications, dietary instructions, activity restrictions, and follow up appointments.   Follow-Up Appointments:  Follow-up Information     Annie Sable, MD Follow up.   Specialty: Nephrology Contact information: [address] [place] [address] [phone]         Center, [facility]. Go on 08/07/2023.   Why: Schedule is Tuesday, Thursday, Saturday with 10:45 am chair time.  On Tuesday (12/10), please arrive at 9:50 am to complete paperwork prior to treatment. Contact information: [address] [place] [address] [phone]         [facility] Follow up.   Why: 12/11 at 10:50AM Contact information: [address] [place] [address] [phone]                Gerrit Heck, DO 08/07/2023, 1:58 PM PGY-1, [facility]  Upper Level Addendum:   I have seen and evaluated this patient along with Dr. Hyacinth Meeker and reviewed the above note, making necessary revisions as appropriate. I agree with the medical decision making and physical exam as noted above.   Elberta Fortis, DO PGY-2, [facility] Family Medicine Residency

## 2023-08-06 NOTE — Progress Notes (Signed)
Patient ID: Adam Davis, male   DOB: July 22, 1976, 47 y.o.   MRN: [id number] [facility] Progress Note   Assessment/ Plan:   1.  Anorexia/dysgeusia: Secondary to uremia.  Now improved 2. 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  Initiated on dialysis here.  MWF schedule.  Plan for access creation and tunneled dialysis catheter today with vascular surgery.  Appreciate help.  Has been clip'd to [facility] can start Tuesday. Okay for DC after surgery today but if he remains inpatient can do HD tomorrow morning.  3. Anemia: Likely secondary to chronic disease including chronic kidney disease.  On ESA with adequate iron stores. Increase ESA 4. CKD-MBD: Significant hyperphosphatemia noted with low calcium level; phosphorus level now at goal.  Continue calcium acetate 3 times a day for phosphorus binding. 5.  Hypokalemia: Corrected with oral supplementation/dialysis. 6.  Hypocalcemia: Improved with intravenous calcium supplementation and oral calcium based phosphorus binder. 7. Hypertension: Improving with hemodialysis and resumption of oral antihypertensive therapy.  Euvolemic.  Subjective:   No complaints today.  Planning for surgery today   Objective:   BP (!) 159/99 (BP Location: Left Arm)   Pulse (!) 109   Temp 99 F (37.2 C) (Oral)   Resp 16   Ht 5\' 6"  (1.676 m)   Wt (S) 67.8 kg Comment: Bed Scale  SpO2 100%   BMI 24.13 kg/m   Physical Exam: Gen:, No distress CVS: Tachycardic, no rub right IJ temporary dialysis catheter Resp: Bilateral chest rise with no increased work of breathing Abd: Soft, flat, nontender, bowel sounds normal Ext: No lower extremity edema.  Scar from previously ligated right RCF noted  Labs: BMET Recent Labs  Lab 08/01/23 0352 08/01/23 1013 08/02/23 0545 08/03/23 0500 08/04/23 0515 08/05/23 0542 08/06/23 0538  NA 136 133* 135 134* 134* 135 132*  K 2.6* 3.2* 2.8* 2.9* 4.0 3.6 3.5  CL 102 102 101 101 101 101 97*  CO2 14* 15* 19*  23 23 23  21*  GLUCOSE 41* 96 112* 142* 147* 142* 174*  BUN 171* 169* 109* 65* 36* 46* 61*  CREATININE 24.50* 24.50* 18.21* 12.79* 8.97* 11.57* 13.31*  CALCIUM 5.7* 5.6* 5.8* 5.9* 7.0* 7.6* 7.6*  PHOS 9.0* 8.7* 5.0* 3.9 2.9 3.8 4.6   CBC Recent Labs  Lab 07/31/23 1632 08/01/23 0352 08/02/23 0545 08/03/23 0500 08/04/23 0515 08/05/23 0542 08/06/23 0538  WBC 13.2*   < > 12.4* 9.2 11.0* 11.0* 11.3*  NEUTROABS 10.2*  --  9.4*  --   --   --   --   HGB 7.1*   < > 7.6* 7.3* 7.8* 7.6* 7.6*  HCT 20.5*   < > 22.0* 21.5* 23.2* 22.7* 23.2*  MCV 70.9*   < > 72.6* 73.4* 74.8* 75.9* 75.6*  PLT 209   < > 174 160 189 195 193   < > = values in this interval not displayed.    Medications:     sodium chloride   Intravenous Once   amLODipine  10 mg Oral Daily   calcitRIOL  0.25 mcg Oral Daily   calcium acetate  1,334 mg Oral TID WC   chlorhexidine  15 mL Mouth/Throat Once   Or   mouth rinse  15 mL Mouth Rinse Once   Chlorhexidine Gluconate Cloth  6 each Topical Q0600   darbepoetin (ARANESP) injection - NON-DIALYSIS  40 mcg Subcutaneous Q Wed-1800   insulin aspart  0-6 Units Subcutaneous TID WC   melatonin  3 mg Oral  QHS   mycophenolate  250 mg Oral BID   tacrolimus  0.5 mg Oral BID   Darnell Level  08/06/2023, 9:46 AM

## 2023-08-06 NOTE — Plan of Care (Signed)
  Problem: Fluid Volume: Goal: Ability to maintain a balanced intake and output will improve Outcome: Progressing   Problem: Health Behavior/Discharge Planning: Goal: Ability to identify and utilize available resources and services will improve Outcome: Progressing Goal: Ability to manage health-related needs will improve Outcome: Progressing   Problem: Education: Goal: Ability to describe self-care measures that may prevent or decrease complications (Diabetes Survival Skills Education) will improve Outcome: Progressing Goal: Individualized Educational Video(s) Outcome: Progressing   Problem: Education: Goal: Knowledge of General Education information will improve Description: Including pain rating scale, medication(s)/side effects and non-pharmacologic comfort measures Outcome: Progressing   Problem: Activity: Goal: Risk for activity intolerance will decrease Outcome: Progressing

## 2023-08-06 NOTE — Anesthesia Procedure Notes (Signed)
Procedure Name: LMA Insertion Date/Time: 08/06/2023 10:58 AM  Performed by: Gus Puma, CRNAPre-anesthesia Checklist: Patient identified, Emergency Drugs available, Suction available and Patient being monitored Patient Re-evaluated:Patient Re-evaluated prior to induction Oxygen Delivery Method: Circle System Utilized Preoxygenation: Pre-oxygenation with 100% oxygen Induction Type: IV induction Ventilation: Mask ventilation without difficulty LMA: LMA inserted LMA Size: 4.0 Number of attempts: 1 Airway Equipment and Method: Bite block Placement Confirmation: positive ETCO2 Tube secured with: Tape Dental Injury: Teeth and Oropharynx as per pre-operative assessment

## 2023-08-06 NOTE — Op Note (Signed)
    NAME: Adam Davis    MRN: [id number] DOB: [birth year]    DATE OF OPERATION: 08/06/2023  PREOP DIAGNOSIS:    End stage renal disease  POSTOP DIAGNOSIS:    Same  PROCEDURE:    Right internal jugular tunneled dialysis cathter placement 19cm palindrome  SURGEON: Victorino Sparrow  ASSIST: None  ANESTHESIA: General   EBL: 10ml  INDICATIONS:    Adam Davis is a 47 y.o. male with end-stage renal disease requiring long-term HD access.  Vein mapping was reviewed demonstrating adequate right sided cephalic vein, likely left-sided AV graft.  I had a long conversation with Adam Davis regarding the above.  Unfortunately, he had an IV in his right cephalic vein.  We discussed AV graft versus AV fistula creation.  He would like an AV fistula as his previous fistula worked well, without issues for years.   We discussed tunneled dialysis catheter placement today, with plans to return to the OR in the coming weeks for right arm fistula creation as I could not promise that left-sided access would be a fistula.  In all likelihood, it would be a graft due to the size of the superficial veins.     After discussing the risks and benefits of tunneled dialysis catheter placement, Adam Davis elected to proceed. I have already reached out to my office for scheduling right sided fistula creation.  FINDINGS:    19cm palindrome catheter placed at the cavoatrial junction  TECHNIQUE:   Patient was brought to the operating room and laid in the supine position.  He was prepped and draped in standard fashion and a timeout was performed.  The patient has a previously placed right sided internal jugular trialysis catheter.  A Glidewire advantage was used running from the trialysis catheter into the inferior vena cava.  This was removed using Seldinger technique with the wire left in place.  Next,  The access point was serially dilated under direct fluoroscopic guidance.  The peel-away access sheath was left  in place.  The neck access was enlarged to roughly 1 cm in size.  A counterincision was made in the chest under the clavicle.  A 19 cm tunneled dialysis catheter was then tunneled under the skin, over the clavicle into the incision in the neck.  The tunneling device was removed and the catheter fed through the peel-away sheath into the superior vena cava.  The peel-away sheath was removed and the catheter gently pulled back.  Adequate position was confirmed with x-ray.  The catheter was tested and found to flush and draw back well.  Catheter was heparin locked.  Caps were applied.  Catheter was sutured to the skin.  The neck incision was closed with 4-0 Monocryl.   Victorino Sparrow, MD [facility] DATE OF DICTATION:   08/06/2023

## 2023-08-06 NOTE — Assessment & Plan Note (Signed)
Did not need anti-emetic PRN overnight. No vomiting noted by patient

## 2023-08-06 NOTE — Discharge Instructions (Addendum)
Dear Adam Davis,   Thank you for letting us participate in your care! In this section, you will find a brief hospital admission summary of why you were admitted to the hospital, what happened during your admission, your diagnosis/diagnoses, and recommended follow up. You had kidney failure and were started on dialysis to be continued outpatient.   POST-HOSPITAL & CARE INSTRUCTIONS We recommend following up with your PCP tomorrow - see below for your appointment time Please let PCP/Specialists know of any changes in medications that were made which you will be able to see in the medications section of this packet. Please also follow up with Nephrology outpatient  DOCTOR'S APPOINTMENTS & FOLLOW UP Future Appointments  Date Time Provider Department Center  08/07/2023  3:30 PM ACCESS TO CARE POOL [id number]     Thank you for choosing [facility]! Take care and be well!  Family Medicine Teaching Service Inpatient Team [place]  [facility]  [address] [place], [place] [address] [phone]

## 2023-08-07 ENCOUNTER — Encounter (HOSPITAL_COMMUNITY): Payer: Self-pay | Admitting: Vascular Surgery

## 2023-08-07 ENCOUNTER — Telehealth (HOSPITAL_COMMUNITY): Payer: Self-pay | Admitting: Pharmacy Technician

## 2023-08-07 ENCOUNTER — Inpatient Hospital Stay: Payer: Self-pay

## 2023-08-07 ENCOUNTER — Other Ambulatory Visit (HOSPITAL_COMMUNITY): Payer: Self-pay

## 2023-08-07 DIAGNOSIS — N186 End stage renal disease: Secondary | ICD-10-CM | POA: Diagnosis not present

## 2023-08-07 LAB — BASIC METABOLIC PANEL
Anion gap: 13 (ref 5–15)
BUN: 74 mg/dL — ABNORMAL HIGH (ref 6–20)
CO2: 21 mmol/L — ABNORMAL LOW (ref 22–32)
Calcium: 7.5 mg/dL — ABNORMAL LOW (ref 8.9–10.3)
Chloride: 95 mmol/L — ABNORMAL LOW (ref 98–111)
Creatinine, Ser: 14.48 mg/dL — ABNORMAL HIGH (ref 0.61–1.24)
GFR, Estimated: 4 mL/min — ABNORMAL LOW (ref >60)
Glucose, Bld: 260 mg/dL — ABNORMAL HIGH (ref 70–99)
Potassium: 3.4 mmol/L — ABNORMAL LOW (ref 3.5–5.1)
Sodium: 129 mmol/L — ABNORMAL LOW (ref 135–145)

## 2023-08-07 LAB — CBC WITH DIFFERENTIAL/PLATELET
Abs Immature Granulocytes: 0.08 10*3/uL — ABNORMAL HIGH (ref 0.00–0.07)
Basophils Absolute: 0 10*3/uL (ref 0.0–0.1)
Basophils Relative: 0 %
Eosinophils Absolute: 0.3 10*3/uL (ref 0.0–0.5)
Eosinophils Relative: 2 %
HCT: 22.3 % — ABNORMAL LOW (ref 39.0–52.0)
Hemoglobin: 7.4 g/dL — ABNORMAL LOW (ref 13.0–17.0)
Immature Granulocytes: 1 %
Lymphocytes Relative: 9 %
Lymphs Abs: 1.1 10*3/uL (ref 0.7–4.0)
MCH: 24.8 pg — ABNORMAL LOW (ref 26.0–34.0)
MCHC: 33.2 g/dL (ref 30.0–36.0)
MCV: 74.8 fL — ABNORMAL LOW (ref 80.0–100.0)
Monocytes Absolute: 1.3 10*3/uL — ABNORMAL HIGH (ref 0.1–1.0)
Monocytes Relative: 10 %
Neutro Abs: 9.8 10*3/uL — ABNORMAL HIGH (ref 1.7–7.7)
Neutrophils Relative %: 78 %
Platelets: 205 10*3/uL (ref 150–400)
RBC: 2.98 MIL/uL — ABNORMAL LOW (ref 4.22–5.81)
RDW: 14.3 % (ref 11.5–15.5)
WBC: 12.6 10*3/uL — ABNORMAL HIGH (ref 4.0–10.5)
nRBC: 0 % (ref 0.0–0.2)

## 2023-08-07 LAB — GLUCOSE, CAPILLARY
Glucose-Capillary: 155 mg/dL — ABNORMAL HIGH (ref 70–99)
Glucose-Capillary: 125 mg/dL — ABNORMAL HIGH (ref 70–99)

## 2023-08-07 MED ORDER — ONETOUCH DELICA PLUS LANCET33G MISC
1.0000 | Freq: Three times a day (TID) | 0 refills | Status: AC
  Filled 2023-08-07 (×2): qty 100, 30d supply, fill #0

## 2023-08-07 MED ORDER — SEVELAMER CARBONATE 800 MG PO TABS
800.0000 mg | ORAL_TABLET | Freq: Three times a day (TID) | ORAL | 0 refills | Status: AC
  Filled 2023-08-07: qty 90, 30d supply, fill #0

## 2023-08-07 MED ORDER — LANCET DEVICE MISC
1.0000 | Freq: Three times a day (TID) | 0 refills | Status: AC
  Filled 2023-08-07: qty 1, 30d supply, fill #0

## 2023-08-07 MED ORDER — HYDROCODONE-ACETAMINOPHEN 5-325 MG PO TABS
1.0000 | ORAL_TABLET | ORAL | 0 refills | Status: DC | PRN
  Filled 2023-08-07: qty 5, 1d supply, fill #0

## 2023-08-07 MED ORDER — DARBEPOETIN ALFA 100 MCG/0.5ML IJ SOSY: 100.0000 ug | PREFILLED_SYRINGE | INTRAMUSCULAR | 0 refills | Status: DC

## 2023-08-07 MED ORDER — DARBEPOETIN ALFA 100 MCG/0.5ML IJ SOSY
100.0000 ug | PREFILLED_SYRINGE | INTRAMUSCULAR | Status: DC
  Filled 2023-08-07: qty 0.5

## 2023-08-07 MED ORDER — BLOOD GLUCOSE MONITOR SYSTEM W/DEVICE KIT
1.0000 | PACK | Freq: Three times a day (TID) | 0 refills | Status: AC
  Filled 2023-08-07: qty 1, 30d supply, fill #0

## 2023-08-07 MED ORDER — HEPARIN SODIUM (PORCINE) 1000 UNIT/ML IJ SOLN
INTRAMUSCULAR | Status: AC
  Filled 2023-08-07: qty 4

## 2023-08-07 MED ORDER — BLOOD GLUCOSE TEST VI STRP
1.0000 | ORAL_STRIP | Freq: Three times a day (TID) | 0 refills | Status: AC
  Filled 2023-08-07: qty 100, 30d supply, fill #0

## 2023-08-07 MED ORDER — INSULIN PEN NEEDLE 32G X 4 MM MISC
0 refills | Status: AC
  Filled 2023-08-07: qty 100, 100d supply, fill #0

## 2023-08-07 MED ORDER — INSULIN GLARGINE-YFGN 100 UNIT/ML ~~LOC~~ SOPN
4.0000 [IU] | PEN_INJECTOR | Freq: Every day | SUBCUTANEOUS | 0 refills | Status: DC
  Filled 2023-08-07: qty 3, 28d supply, fill #0

## 2023-08-07 NOTE — Plan of Care (Signed)

## 2023-08-07 NOTE — Telephone Encounter (Signed)
Patient called, LVM and informed that forms are ready for pick up. Copy made and placed in batch scanning. Original placed at front desk for pick up.   Talin Feister C Cecia Egge, RN  

## 2023-08-07 NOTE — Telephone Encounter (Signed)
Patient Product/process development scientist completed.    The patient is insured through [facility]. Patient has [employer], may use a copay card, and/or apply for patient assistance if available.    Ran test claim for sevelamer carbonate (Renvela) 800 mg and the current 30 day co-pay is $20.00.   This test claim was processed through [facility]- copay amounts may vary at other pharmacies due to pharmacy/plan contracts, or as the patient moves through the different stages of their insurance plan.     Roland Earl, CPHT Pharmacy Technician III Certified Patient Advocate [facility] Patient Advocate Team Direct Number: [phone]  Fax: [phone]

## 2023-08-07 NOTE — Progress Notes (Signed)
New Dialysis Start    Patient identified as new dialysis start. Kidney Education packet assembled and given. Discussed the following items with patient:     Current medications and possible changes once started:  Discussed that patient's medications may change over time.  Ex; hypertension medications and diabetes medication.  Nephrologists will adjust as needed.   Fluid restrictions reviewed:  32 oz daily goal:  All liquids count; soups, ice, jello, fruits. Will also refer dietitian.   Phosphorus and potassium: Handout given showing high potassium and phosphorus foods.  Alternative food and drink options given. Will also refer dietitian.   Family support:  Wife    Outpatient Clinic Resources:  Discussed roles of Outpatient clinic staff and advised to make a list of needs, if any, to talk with outpatient staff if needed.   Care plan schedule: Informed patient of Care Plans in outpatient setting and to participate in the care plan.  An invitation would be given from outpatient clinic.    Dialysis Access Options:  Reviewed access options with patients. Discussed in detail about care at home with new AVG & AVF. Reviewed checking bruit and thrill. If dialysis catheter present, educated that patient could not take showers.  Catheter dressing changes were to be done by outpatient clinic staff only.   Home therapy options:  Educated patient about home therapy options:  PD vs home hemo.     Patient verbalized understanding. Will continue to round on patient during admission.    Jean Rosenthal Dialysis Nurse Coordinator [phone]

## 2023-08-07 NOTE — Progress Notes (Signed)
Out Patient Arrangements:   Spoke w/ HD clinic to inform them pt is still in. Spoke w/ Melissa.   Pt has been accepted at [facility] on TTS 10:45 am chair time. Pt can start on Tuesday, Dec 10 and will need to arrive at 9:50 am to complete paperwork prior to treatment. Per French Ana Mounce's note pt & wife agreeable  to plan. Will assist as needed.    Andree Elk HPSS [phone]

## 2023-08-07 NOTE — Progress Notes (Signed)
Patient ID: Adam Davis, male   DOB: May 24, 1976, 47 y.o.   MRN: [id number] [facility] Progress Note   Assessment/ Plan:   1.  Anorexia/dysgeusia: Secondary to uremia.  Now improved 2. 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  Initiated on dialysis here.  MWF schedule.  Plan for access creation and tunneled dialysis catheter today with vascular surgery.  Appreciate help.  Has been clip'd to [facility] can start Tuesday. Okay for DC after dialysis today.  TDC in place.  aVF outpatient 3. Anemia: Likely secondary to chronic disease including chronic kidney disease.  On ESA with adequate iron stores. Increased ESA 4. CKD-MBD: Significant hyperphosphatemia noted with low calcium level; phosphorus level now at goal.  Calcium acetate not covered by insurance.  Will switch to sevelamer 800 mg 3 times daily at DC 5.  Hypokalemia: Corrected with oral supplementation/dialysis. 6.  Hypocalcemia: Improved with intravenous calcium supplementation and oral calcium based phosphorus binder. 7. Hypertension: Improving with hemodialysis and resumption of oral antihypertensive therapy.  Euvolemic.  Subjective:   Patient feels well with no complaints.  Planning to discharge yesterday but was having some low blood sugars.  Yesterday underwent tunneled dialysis catheter placement.  Fistula creation was deferred to the outpatient setting.   Objective:   BP (!) 167/97   Pulse (!) 104   Temp 98.4 F (36.9 C)   Resp 20   Ht 5\' 6"  (1.676 m)   Wt (S) 66.6 kg Comment: Standing Scale  SpO2 98%   BMI 23.70 kg/m   Physical Exam: Gen:, No distress, lying in bed CVS: Tachycardic, no rub, right tunneled dialysis catheter Resp: Bilateral chest rise with no increased work of breathing Abd: Soft, flat, nontender, bowel sounds normal Ext: No lower extremity edema.  Scar from previously ligated right RCF noted  Labs: BMET Recent Labs  Lab 08/01/23 0352 08/01/23 1013 08/02/23 0545  08/03/23 0500 08/04/23 0515 08/05/23 0542 08/06/23 0538  NA 136 133* 135 134* 134* 135 132*  K 2.6* 3.2* 2.8* 2.9* 4.0 3.6 3.5  CL 102 102 101 101 101 101 97*  CO2 14* 15* 19* 23 23 23  21*  GLUCOSE 41* 96 112* 142* 147* 142* 174*  BUN 171* 169* 109* 65* 36* 46* 61*  CREATININE 24.50* 24.50* 18.21* 12.79* 8.97* 11.57* 13.31*  CALCIUM 5.7* 5.6* 5.8* 5.9* 7.0* 7.6* 7.6*  PHOS 9.0* 8.7* 5.0* 3.9 2.9 3.8 4.6   CBC Recent Labs  Lab 07/31/23 1632 08/01/23 0352 08/02/23 0545 08/03/23 0500 08/04/23 0515 08/05/23 0542 08/06/23 0538 08/07/23 0911  WBC 13.2*   < > 12.4*   < > 11.0* 11.0* 11.3* 12.6*  NEUTROABS 10.2*  --  9.4*  --   --   --   --  9.8*  HGB 7.1*   < > 7.6*   < > 7.8* 7.6* 7.6* 7.4*  HCT 20.5*   < > 22.0*   < > 23.2* 22.7* 23.2* 22.3*  MCV 70.9*   < > 72.6*   < > 74.8* 75.9* 75.6* 74.8*  PLT 209   < > 174   < > 189 195 193 205   < > = values in this interval not displayed.    Medications:     sodium chloride   Intravenous Once   amLODipine  10 mg Oral Daily   calcitRIOL  0.25 mcg Oral Daily   calcium acetate  1,334 mg Oral TID WC   Chlorhexidine Gluconate Cloth  6 each Topical Q0600   [  START ON 08/08/2023] darbepoetin (ARANESP) injection - NON-DIALYSIS  100 mcg Subcutaneous Q Wed-1800   insulin aspart  0-6 Units Subcutaneous TID WC   insulin glargine-yfgn  4 Units Subcutaneous QHS   melatonin  3 mg Oral QHS   mycophenolate  250 mg Oral BID   tacrolimus  0.5 mg Oral BID   Darnell Level  08/07/2023, 9:27 AM

## 2023-08-07 NOTE — Progress Notes (Signed)
   08/07/23 1316  Vitals  Temp 98.9 F (37.2 C)  Pulse Rate (!) 104  Resp 19  BP (!) 144/93  SpO2 100 %  O2 Device Room Air  During Treatment Monitoring  Blood Flow Rate (mL/min) 399 mL/min  Arterial Pressure (mmHg) -157.77 mmHg  Venous Pressure (mmHg) 153.32 mmHg  TMP (mmHg) 1.21 mmHg  Ultrafiltration Rate (mL/min) 403 mL/min  Dialysate Flow Rate (mL/min) 300 ml/min  Dialysate Potassium Concentration 3  Dialysate Calcium Concentration 2.5  Duration of HD Treatment -hour(s) 1 hour(s)  Cumulative Fluid Removed (mL) per Treatment  0.09  HD Safety Checks Performed Yes  Intra-Hemodialysis Comments Tx completed (pt denies complaint)     08/07/23 1316  Vitals  Temp 98.9 F (37.2 C)  Pulse Rate (!) 104  Resp 19  BP (!) 144/93  SpO2 100 %  O2 Device Room Air  During Treatment Monitoring  Blood Flow Rate (mL/min) 399 mL/min  Arterial Pressure (mmHg) -157.77 mmHg  Venous Pressure (mmHg) 153.32 mmHg  TMP (mmHg) 1.21 mmHg  Ultrafiltration Rate (mL/min) 403 mL/min  Dialysate Flow Rate (mL/min) 300 ml/min  Dialysate Potassium Concentration 3  Dialysate Calcium Concentration 2.5  Duration of HD Treatment -hour(s) 1 hour(s)  Cumulative Fluid Removed (mL) per Treatment  0.09  HD Safety Checks Performed Yes  Intra-Hemodialysis Comments Tx completed (pt denies complaint)   Received patient in bed to unit.  Alert and oriented.  Informed consent signed and in chart.   TX duration: 3.5  Patient tolerated well.  Transported back to the room  Alert, without acute distress.  Hand-off given to patient's nurse.   Access used: Yes Access issues: No  Total UF removed: 0 Medication(s) given: See MAR Post HD VS: See Above Grid Post HD weight: 66.2 kg   [facility]

## 2023-08-07 NOTE — Anesthesia Postprocedure Evaluation (Signed)
Anesthesia Post Note  Patient: Adam Davis  Procedure(s) Performed: INSERTION OF TUNNELED DIALYSIS CATHETER, RIGHT INTERNAL JUGULAR (Right: Chest)     Patient location during evaluation: PACU Anesthesia Type: General Level of consciousness: sedated and patient cooperative Pain management: pain level controlled Vital Signs Assessment: post-procedure vital signs reviewed and stable Respiratory status: spontaneous breathing Cardiovascular status: stable Anesthetic complications: no   No notable events documented.  Last Vitals:  Vitals:   08/06/23 1955 08/07/23 0514  BP:  (!) 148/90  Pulse: (!) 106 98  Resp:  19  Temp:  36.7 C  SpO2:  97%    Last Pain:  Vitals:   08/07/23 0514  TempSrc: Oral  PainSc:                  Lewie Loron

## 2023-08-08 ENCOUNTER — Other Ambulatory Visit (HOSPITAL_COMMUNITY): Payer: Self-pay

## 2023-08-08 ENCOUNTER — Telehealth: Payer: Self-pay

## 2023-08-08 ENCOUNTER — Inpatient Hospital Stay: Payer: Self-pay

## 2023-08-08 NOTE — Progress Notes (Signed)
Late Note Entry- Aug 08, 2023  Pt was d/c yesterday. Contacted [facility] this morning to advise clinic of pt's d/c date and that pt should start tomorrow.   Olivia Canter Renal Navigator [phone]

## 2023-08-08 NOTE — Telephone Encounter (Signed)
Attempted to reach patient to schedule right arm fistula creation. Left VM for patient to return call.

## 2023-08-08 NOTE — Transitions of Care (Post Inpatient/ED Visit) (Signed)
   08/08/2023  Name: Adam Davis MRN: [id number] DOB: May 03, 1976  Today's TOC FU Call Status: Today's TOC FU Call Status:: Unsuccessful Call (1st Attempt) Unsuccessful Call (1st Attempt) Date: 08/08/23  Attempted to reach the patient regarding the most recent Inpatient/ED visit.  Follow Up Plan: Additional outreach attempts will be made to reach the patient to complete the Transitions of Care (Post Inpatient/ED visit) call.   Jodelle Gross RN, BSN, CCM RN Care Manager  Transitions of Care  [facility] - [facility]  [phone]

## 2023-08-09 ENCOUNTER — Other Ambulatory Visit: Payer: Self-pay

## 2023-08-09 ENCOUNTER — Telehealth: Payer: Self-pay

## 2023-08-09 ENCOUNTER — Other Ambulatory Visit (HOSPITAL_COMMUNITY): Payer: Self-pay

## 2023-08-09 DIAGNOSIS — T8249XA Other complication of vascular dialysis catheter, initial encounter: Secondary | ICD-10-CM | POA: Diagnosis not present

## 2023-08-09 DIAGNOSIS — Z992 Dependence on renal dialysis: Secondary | ICD-10-CM | POA: Diagnosis not present

## 2023-08-09 DIAGNOSIS — N186 End stage renal disease: Secondary | ICD-10-CM | POA: Diagnosis not present

## 2023-08-09 NOTE — Transitions of Care (Post Inpatient/ED Visit) (Signed)
08/09/2023  Name: Adam Davis MRN: [id number] DOB: Sep 18, 1975  Today's TOC FU Call Status:   Patient's Name and Date of Birth confirmed.  Transition Care Management Follow-up Telephone Call Discharge Facility: [name]) Type of Discharge: Inpatient Admission Primary Inpatient Discharge Diagnosis:: Acute Kidney Injury; New Dialysis How have you been since you were released from the hospital?: Better Any questions or concerns?: Yes Patient Questions/Concerns::  (Patient found out at dialysis today they will administer medication) Patient Questions/Concerns Addressed: Notified Provider of Patient Questions/Concerns (Spoke with Marcy Salvo at [facility] who would manage this medication.  He said they could prepare for patient but the doctor has not sent the actual prescription to the pharmacy.  Message sent to [facility] RN team with high priority.)  Items Reviewed: Did you receive and understand the discharge instructions provided?: Yes Medications obtained,verified, and reconciled?: Yes (Medications Reviewed) Any new allergies since your discharge?: No Dietary orders reviewed?: No Do you have support at home?: Yes People in Home: spouse Name of Support/Comfort Primary Source: Arnoldo Hooker  Medications Reviewed Today: Medications Reviewed Today     Reviewed by Jodelle Gross, RN (Case Manager) on 08/09/23 at 1533  Med List Status: <None>   Medication Order Taking? Sig Documenting Provider Last Dose Status Informant  amLODipine (NORVASC) 10 MG tablet [id number] Yes Take 10 mg by mouth daily. [provider] Taking Active Self, Pharmacy Records   Patient not taking:   Discontinued 05/09/19 1615 Blood Glucose Monitoring Suppl (BLOOD GLUCOSE MONITOR SYSTEM) w/Device KIT [id number] Yes Use in the morning, at noon, and at bedtime. Elberta Fortis, MD Taking Active   calcitRIOL (ROCALTROL) 0.25 MCG capsule [id number] Yes Take 1 capsule (0.25 mcg total) by mouth  daily. Vonna Drafts, MD Taking Active   Darbepoetin Alfa (ARANESP) 100 MCG/0.5ML SOSY injection [id number] No Inject 0.5 mLs (100 mcg total) into the skin every Wednesday at 6 PM.  Patient not taking: Reported on 08/09/2023   Elberta Fortis, MD Not Taking Active            Med Note Electa Sniff [facility]   Thu Aug 09, 2023  3:33 PM) Per patient's spouse, medication will be given at dialysis  Glucose Blood (BLOOD GLUCOSE TEST STRIPS) STRP [id number] Yes Use in the morning, at noon, and at bedtime. Elberta Fortis, MD Taking Active   HYDROcodone-acetaminophen (NORCO/VICODIN) 5-325 MG tablet [id number] Yes Take 1 tablet by mouth every 4 (four) hours as needed for moderate pain (pain score 4-6). Elberta Fortis, MD Taking Active   insulin glargine-yfgn [facility]) 100 UNIT/ML Pen [id number] Yes Inject 4 Units into the skin daily. Expires 28 days after first use Elberta Fortis, MD Taking Active   Insulin Pen Needle 32G X 4 MM MISC [id number] Yes Use with Semglee pen  Taking Active   Lancet Device MISC [id number] Yes 1 each by Does not apply route in the morning, at noon, and at bedtime. May substitute to any manufacturer covered by patient's insurance. Elberta Fortis, MD Taking Active   Lancets [facility] [place]) MISC [id number] Yes Use in the morning, at noon, and at bedtime. Elberta Fortis, MD Taking Active   multivitamin (RENA-VIT) TABS tablet [id number] Yes Take 1 tablet by mouth daily. Vonna Drafts, MD Taking Active   mycophenolate (CELLCEPT) 250 MG capsule [id number] Yes Take 1 capsule (250 mg total) by mouth 2 (two) times daily. Vonna Drafts, MD Taking Active   rosuvastatin (CRESTOR) 10 MG tablet [id number] Yes Take one tablet every Monday, Wednesday, and Friday.  Patient taking differently: Take 5 mg by mouth 3 (three) times a week. Take one tablet every Monday, Wednesday, and Friday.   Sabino Dick, DO Taking Active Self, Pharmacy Records           Med Note Nedra Hai, NICOLE   Tue  Jul 31, 2023 10:58 PM) Taking half tablet. Causes muscle cramps.   sevelamer carbonate (RENVELA) 800 MG tablet [id number] Yes Take 1 tablet (800 mg total) by mouth 3 (three) times daily with meals. Elberta Fortis, MD Taking Active   tacrolimus (PROGRAF) 0.5 MG capsule [id number] Yes Take 1 capsule (0.5 mg total) by mouth 2 (two) times daily. Vonna Drafts, MD Taking Active   Med List Note Charlyne Petrin, CPhT 07/31/23 2245):              Home Care and Equipment/Supplies: Were Home Health Services Ordered?: No Any new equipment or medical supplies ordered?: No  Functional Questionnaire: Do you need assistance with bathing/showering or dressing?: No Do you need assistance with meal preparation?: No Do you need assistance with eating?: No Do you have difficulty maintaining continence: No Do you need assistance with getting out of bed/getting out of a chair/moving?: No Do you have difficulty managing or taking your medications?: No  Follow up appointments reviewed: PCP Follow-up appointment confirmed?: Yes Date of PCP follow-up appointment?: 08/17/23 Follow-up Provider: Dr. Fatima Blank Specialist [facility] Follow-up appointment confirmed?: NA Do you need transportation to your follow-up appointment?: No Do you understand care options if your condition(s) worsen?: Yes-patient verbalized understanding  SDOH Interventions Today    Flowsheet Row Most Recent Value  SDOH Interventions   Food Insecurity Interventions Intervention Not Indicated  Housing Interventions Intervention Not Indicated  Transportation Interventions Intervention Not Indicated  Utilities Interventions Intervention Not Indicated      Jodelle Gross RN, BSN, CCM RN Care Manager  Transitions of Care  [facility] - [facility]  [phone]

## 2023-08-10 ENCOUNTER — Telehealth: Payer: Self-pay

## 2023-08-10 NOTE — Telephone Encounter (Signed)
Received the following message from RN case manager regarding patient.   Jodelle Gross, RN  P [facility] Rn Team Good morning, Mr. Krane was prescribed Aranesp Injection upon dc from hospital.  [facility] pharmacy does not have an actual prescription for it and there are no refills for it.  This medication needs to be filled at [facility].  It was put on dc orders by Elberta Fortis MD.  Can you please have this sent in to [facility] and let me know so I can call the patient back. Thank you, Deb  Jodelle Gross RN, BSN, CCM RN Care Manager  Transitions of Care [facility] - [facility] [phone]  Will forward request to Dr. Ardyth Harps.   Veronda Prude, RN

## 2023-08-11 DIAGNOSIS — N186 End stage renal disease: Secondary | ICD-10-CM | POA: Diagnosis not present

## 2023-08-11 DIAGNOSIS — T8249XA Other complication of vascular dialysis catheter, initial encounter: Secondary | ICD-10-CM | POA: Diagnosis not present

## 2023-08-11 DIAGNOSIS — Z992 Dependence on renal dialysis: Secondary | ICD-10-CM | POA: Diagnosis not present

## 2023-08-13 ENCOUNTER — Inpatient Hospital Stay: Payer: Self-pay | Admitting: Student

## 2023-08-14 DIAGNOSIS — N186 End stage renal disease: Secondary | ICD-10-CM | POA: Diagnosis not present

## 2023-08-14 DIAGNOSIS — Z992 Dependence on renal dialysis: Secondary | ICD-10-CM | POA: Diagnosis not present

## 2023-08-14 DIAGNOSIS — T8249XA Other complication of vascular dialysis catheter, initial encounter: Secondary | ICD-10-CM | POA: Diagnosis not present

## 2023-08-14 NOTE — Progress Notes (Deleted)
    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 Upon admission pt had a Cr 26.54 and BUN 172. Nephrology was consulted and he was initiated on inpatient dialysis. He had a RIJ catheter placed with IR, which was then replaced with tunneled cath prior to discharge. Patient tolerated HD well and was set up by renal navigator with outpatient HD. Will require follow up with vascular AVG placement in a few weeks.  Anemia of Chronic Disease In the setting of ESRD. Lowest hemoglobin during hospitalization was 6.3. Required 1u pRBC transfusion during his stay which improved his hemoglobin. Was started on ESA.  T2DM A1c 6.8 during admission. Multiple episodes of rapid hypoglycemia, particularly when NPO.  BGL trended up as PO intake increased and he was manage with low-dose LAI and SSI. He was instructed to monitor his BG while taking Semglee 4 units daily due to hypoglycemia and insulin sensitivity.   Baloch as PCP--Please change to her when patient arrives at [facility] Adjust LAI outpatient, discuss possible need for mealtime insulin Ensure nephro f/u - set up for HD starting 12/10 at [facility]. 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  General: Awake and Alert in NAD HEENT: NCAT. Sclera anicteric. No rhinorrhea. Cardiovascular: RRR. No M/R/G Respiratory: CTAB, normal WOB on RA. No wheezing, crackles, rhonchi, or diminished breath sounds. Abdomen: Soft, non-tender, non-distended. Bowel sounds normoactive/hypoactive/hyperactive. *** Extremities: No BLE edema, no deformities or significant joint findings. Skin: Warm and dry. No abrasions or rashes noted. Neuro: A&Ox***. No focal neurological deficits.   ASSESSMENT/PLAN:   No problem-specific  Assessment & Plan notes found for this encounter.     Fortunato Curling, DO [facility]

## 2023-08-16 DIAGNOSIS — T8249XA Other complication of vascular dialysis catheter, initial encounter: Secondary | ICD-10-CM | POA: Diagnosis not present

## 2023-08-16 DIAGNOSIS — Z992 Dependence on renal dialysis: Secondary | ICD-10-CM | POA: Diagnosis not present

## 2023-08-16 DIAGNOSIS — N186 End stage renal disease: Secondary | ICD-10-CM | POA: Diagnosis not present

## 2023-08-17 ENCOUNTER — Inpatient Hospital Stay: Payer: BC Managed Care – PPO | Admitting: Family Medicine

## 2023-08-17 ENCOUNTER — Other Ambulatory Visit: Payer: Self-pay

## 2023-08-17 DIAGNOSIS — N186 End stage renal disease: Secondary | ICD-10-CM

## 2023-08-17 NOTE — Telephone Encounter (Signed)
Patient returned call and scheduled surgery for 09/07/23. Instructions provided and patient repeated back, verbalizing understanding.

## 2023-08-18 DIAGNOSIS — T8249XA Other complication of vascular dialysis catheter, initial encounter: Secondary | ICD-10-CM | POA: Diagnosis not present

## 2023-08-18 DIAGNOSIS — Z992 Dependence on renal dialysis: Secondary | ICD-10-CM | POA: Diagnosis not present

## 2023-08-18 DIAGNOSIS — N186 End stage renal disease: Secondary | ICD-10-CM | POA: Diagnosis not present

## 2023-08-20 DIAGNOSIS — T8249XA Other complication of vascular dialysis catheter, initial encounter: Secondary | ICD-10-CM | POA: Diagnosis not present

## 2023-08-20 DIAGNOSIS — Z992 Dependence on renal dialysis: Secondary | ICD-10-CM | POA: Diagnosis not present

## 2023-08-20 DIAGNOSIS — R52 Pain, unspecified: Secondary | ICD-10-CM | POA: Diagnosis not present

## 2023-08-20 DIAGNOSIS — N186 End stage renal disease: Secondary | ICD-10-CM | POA: Diagnosis not present

## 2023-08-23 DIAGNOSIS — Z992 Dependence on renal dialysis: Secondary | ICD-10-CM | POA: Diagnosis not present

## 2023-08-23 DIAGNOSIS — R52 Pain, unspecified: Secondary | ICD-10-CM | POA: Diagnosis not present

## 2023-08-23 DIAGNOSIS — N186 End stage renal disease: Secondary | ICD-10-CM | POA: Diagnosis not present

## 2023-08-23 DIAGNOSIS — T8249XA Other complication of vascular dialysis catheter, initial encounter: Secondary | ICD-10-CM | POA: Diagnosis not present

## 2023-08-24 ENCOUNTER — Ambulatory Visit (INDEPENDENT_AMBULATORY_CARE_PROVIDER_SITE_OTHER): Payer: BC Managed Care – PPO | Admitting: Family Medicine

## 2023-08-24 ENCOUNTER — Encounter: Payer: Self-pay | Admitting: Family Medicine

## 2023-08-24 VITALS — BP 149/92 | HR 103 | Wt 146.2 lb

## 2023-08-24 DIAGNOSIS — N186 End stage renal disease: Secondary | ICD-10-CM | POA: Diagnosis not present

## 2023-08-24 DIAGNOSIS — Z992 Dependence on renal dialysis: Secondary | ICD-10-CM

## 2023-08-24 DIAGNOSIS — E1165 Type 2 diabetes mellitus with hyperglycemia: Secondary | ICD-10-CM

## 2023-08-24 DIAGNOSIS — Z794 Long term (current) use of insulin: Secondary | ICD-10-CM | POA: Diagnosis not present

## 2023-08-24 NOTE — Progress Notes (Signed)
    SUBJECTIVE:   CHIEF COMPLAINT / HPI: Hospital follow-up  Hospitalized 12/03-12/09, for progression to ESRD and need for dialysis.  PCP Follow-up Recommendations: Baloch as PCP--Please hange to her when patient arrives at [facility] Adjust LAI outpatient, discuss possible need for mealtime insulin Checking at home with strips. Fluctuating between 90-400s. Left on 4units of insulin. Has increased in morning dose randomly up to 9u but is not doing that consistently.  Ensure nephro f/u - set up for HD starting 12/10 at [facility]. Will also need titration off post transplant meds with nephro. Going to dialys TThuSa. Had appointment with Nephrologist. Reduced Mycophenolate, and kept Tacrolimus same.  PERTINENT  PMH / PSH: ESRD, T2DM  OBJECTIVE:   BP (!) 149/92   Pulse (!) 103   Wt 146 lb 3.2 oz (66.3 kg)   SpO2 100%   BMI 23.60 kg/m   General: NAD  Neuro: A&O Cardiovascular: RRR, no murmurs, no peripheral edema Respiratory: normal WOB on RA, CTAB, no wheezes, ronchi or rales Extremities: Moving all 4 extremities equally   ASSESSMENT/PLAN:   Assessment & Plan Type 2 diabetes mellitus with hyperglycemia, with long-term current use of insulin (HCC) Counseled extensively on correct use of insulin and do not vary dose based on single CBG numbers.  Discussed continuing 4 units daily of Semglee.  Patient agreeable to scheduling follow-up appointment with Dr. Hildred Laser for CGM and possible initiation of mealtime coverage.  Patient clearly has large variation in his sugars, and it is unclear if this is due to dialysis versus need for mealtime coverage. ESRD (end stage renal disease) on dialysis [facility]) Patient is doing well going to dialysis 3 times a week on Tuesday, Thursday, Saturday.  Has not missed any sessions.  Would recheck renal function panel at next visit.  Updated transplant medications to reflect slow titration that his nephrologist has started since leaving the hospital.  Return in about 1  week (around 08/31/2023).  Celine Mans, MD [facility]

## 2023-08-24 NOTE — Assessment & Plan Note (Signed)
Patient is doing well going to dialysis 3 times a week on Tuesday, Thursday, Saturday.  Has not missed any sessions.  Would recheck renal function panel at next visit.  Updated transplant medications to reflect slow titration that his nephrologist has started since leaving the hospital.

## 2023-08-24 NOTE — Patient Instructions (Addendum)
It was great to see you! Thank you for allowing me to participate in your care!  Our plans for today:  -Please continue to take your long-acting insulin 4 units daily.  Please make an appointment to schedule with Dr. Raymondo Band for your insulin management upfront. -You have an appointment on January 3 with our clinic to discuss your hip pain.   Please arrive 15 minutes PRIOR to your next scheduled appointment time! If you do not, this affects OTHER patients' care.  Take care and seek immediate care sooner if you develop any concerns.   Celine Mans, MD, PGY-2 [facility] 12:05 PM 08/24/2023  [facility]

## 2023-08-24 NOTE — Assessment & Plan Note (Signed)
Counseled extensively on correct use of insulin and do not vary dose based on single CBG numbers.  Discussed continuing 4 units daily of Semglee.  Patient agreeable to scheduling follow-up appointment with Dr. Hildred Laser for CGM and possible initiation of mealtime coverage.  Patient clearly has large variation in his sugars, and it is unclear if this is due to dialysis versus need for mealtime coverage.

## 2023-08-25 DIAGNOSIS — T8249XA Other complication of vascular dialysis catheter, initial encounter: Secondary | ICD-10-CM | POA: Diagnosis not present

## 2023-08-25 DIAGNOSIS — R52 Pain, unspecified: Secondary | ICD-10-CM | POA: Diagnosis not present

## 2023-08-25 DIAGNOSIS — N186 End stage renal disease: Secondary | ICD-10-CM | POA: Diagnosis not present

## 2023-08-25 DIAGNOSIS — Z992 Dependence on renal dialysis: Secondary | ICD-10-CM | POA: Diagnosis not present

## 2023-08-27 DIAGNOSIS — N186 End stage renal disease: Secondary | ICD-10-CM | POA: Diagnosis not present

## 2023-08-27 DIAGNOSIS — T8249XA Other complication of vascular dialysis catheter, initial encounter: Secondary | ICD-10-CM | POA: Diagnosis not present

## 2023-08-27 DIAGNOSIS — Z992 Dependence on renal dialysis: Secondary | ICD-10-CM | POA: Diagnosis not present

## 2023-08-28 DIAGNOSIS — N186 End stage renal disease: Secondary | ICD-10-CM | POA: Diagnosis not present

## 2023-08-28 DIAGNOSIS — T8612 Kidney transplant failure: Secondary | ICD-10-CM | POA: Diagnosis not present

## 2023-08-28 DIAGNOSIS — Z992 Dependence on renal dialysis: Secondary | ICD-10-CM | POA: Diagnosis not present

## 2023-08-30 DIAGNOSIS — N186 End stage renal disease: Secondary | ICD-10-CM | POA: Diagnosis not present

## 2023-08-30 DIAGNOSIS — T8249XA Other complication of vascular dialysis catheter, initial encounter: Secondary | ICD-10-CM | POA: Diagnosis not present

## 2023-08-30 DIAGNOSIS — Z992 Dependence on renal dialysis: Secondary | ICD-10-CM | POA: Diagnosis not present

## 2023-09-01 DIAGNOSIS — T8249XA Other complication of vascular dialysis catheter, initial encounter: Secondary | ICD-10-CM | POA: Diagnosis not present

## 2023-09-01 DIAGNOSIS — N186 End stage renal disease: Secondary | ICD-10-CM | POA: Diagnosis not present

## 2023-09-01 DIAGNOSIS — Z992 Dependence on renal dialysis: Secondary | ICD-10-CM | POA: Diagnosis not present

## 2023-09-03 ENCOUNTER — Ambulatory Visit: Payer: Self-pay | Admitting: Family Medicine

## 2023-09-04 DIAGNOSIS — T8249XA Other complication of vascular dialysis catheter, initial encounter: Secondary | ICD-10-CM | POA: Diagnosis not present

## 2023-09-04 DIAGNOSIS — Z992 Dependence on renal dialysis: Secondary | ICD-10-CM | POA: Diagnosis not present

## 2023-09-04 DIAGNOSIS — N186 End stage renal disease: Secondary | ICD-10-CM | POA: Diagnosis not present

## 2023-09-06 ENCOUNTER — Encounter (HOSPITAL_COMMUNITY): Payer: Self-pay | Admitting: Vascular Surgery

## 2023-09-06 ENCOUNTER — Other Ambulatory Visit: Payer: Self-pay

## 2023-09-06 DIAGNOSIS — N186 End stage renal disease: Secondary | ICD-10-CM | POA: Diagnosis not present

## 2023-09-06 DIAGNOSIS — Z992 Dependence on renal dialysis: Secondary | ICD-10-CM | POA: Diagnosis not present

## 2023-09-06 DIAGNOSIS — T8249XA Other complication of vascular dialysis catheter, initial encounter: Secondary | ICD-10-CM | POA: Diagnosis not present

## 2023-09-06 NOTE — Progress Notes (Signed)
 PCP - Alba Sharper, MD   Cardiologist -   PPM/ICD - denies Device Orders - n/a Rep Notified - n/a  Chest x-ray - 08-06-23 EKG - 08-02-23 Stress Test -  ECHO - 07-15-2003 Cardiac Cath -   CPAP - denies   Fasting Blood Sugar - between 190-240 Checks Blood Sugar  between 1-3 times a day  Blood Thinner Instructions: denies Aspirin Instructions: n/a  ERAS Protcol - NPO  COVID TEST-   Anesthesia review: no  Patient verbally denies any shortness of breath, fever, cough and chest pain during phone call   -------------  SDW INSTRUCTIONS given:  Your procedure is scheduled on September 07, 2023.  Report to [facility] Main Entrance A at 8:25 A.M., and check in at the Admitting office.  Call this number if you have problems the morning of surgery:  [phone]   Remember:  Do not eat or drink after midnight the night before your surgery      Take these medicines the morning of surgery with A SIP OF WATER  amLODipine  (NORVASC )  mycophenolate  (CELLCEPT )  tacrolimus  (PROGRAF )   IF NEEDED HYDROcodone -acetaminophen  (NORCO/VICODIN)    WHAT DO I DO ABOUT MY DIABETES MEDICATION? SEMGLEE )   Do not take oral diabetes medicines (pills) the morning of surgery. (SEMGLEE ) Inject 4 Units into the skin daily ) THE NIGHT BEFORE SURGERY, take 2 units of SEMGLEE ) insulin . IF patient takes at bedtime       THE MORNING OF SURGERY, take 2 units of SEMGLEE ) insulin . IF patient takes in the am  The day of surgery, do not take other diabetes injectables, including Byetta (exenatide), Bydureon (exenatide ER), Victoza (liraglutide), or Trulicity  (dulaglutide ).  If your CBG is greater than 220 mg/dL, you may take  of your sliding scale (correction) dose of insulin .   HOW TO MANAGE YOUR DIABETES BEFORE AND AFTER SURGERY  Why is it important to control my blood sugar before and after surgery? Improving blood sugar levels before and after surgery helps healing and can limit  problems. A way of improving blood sugar control is eating a healthy diet by:  Eating less sugar and carbohydrates  Increasing activity/exercise  Talking with your doctor about reaching your blood sugar goals High blood sugars (greater than 180 mg/dL) can raise your risk of infections and slow your recovery, so you will need to focus on controlling your diabetes during the weeks before surgery. Make sure that the doctor who takes care of your diabetes knows about your planned surgery including the date and location.  How do I manage my blood sugar before surgery? Check your blood sugar at least 4 times a day, starting 2 days before surgery, to make sure that the level is not too high or low.  Check your blood sugar the morning of your surgery when you wake up and every 2 hours until you get to the Short Stay unit.  If your blood sugar is less than 70 mg/dL, you will need to treat for low blood sugar: Do not take insulin . Treat a low blood sugar (less than 70 mg/dL) with  cup of clear juice (cranberry or apple), 4 glucose tablets, OR glucose gel. Recheck blood sugar in 15 minutes after treatment (to make sure it is greater than 70 mg/dL). If your blood sugar is not greater than 70 mg/dL on recheck, call [phone] for further instructions. Report your blood sugar to the short stay nurse when you get to Short Stay.  If you are admitted to  the hospital after surgery: Your blood sugar will be checked by the staff and you will probably be given insulin  after surgery (instead of oral diabetes medicines) to make sure you have good blood sugar levels. The goal for blood sugar control after surgery is 80-180 mg/dL.  As of today, STOP taking any Aspirin (unless otherwise instructed by your surgeon) Aleve , Naproxen , Ibuprofen, Motrin, Advil, Goody's, BC's, all herbal medications, fish oil, and all vitamins.                      Do not wear jewelry, make up, or nail polish            Do not wear  lotions, powders, perfumes/colognes, or deodorant.            Do not shave 48 hours prior to surgery.  Men may shave face and neck.            Do not bring valuables to the hospital.            [facility] is not responsible for any belongings or valuables.  Do NOT Smoke (Tobacco/Vaping) 24 hours prior to your procedure If you use a CPAP at night, you may bring all equipment for your overnight stay.   Contacts, glasses, dentures or bridgework may not be worn into surgery.      For patients admitted to the hospital, discharge time will be determined by your treatment team.   Patients discharged the day of surgery will not be allowed to drive home, and someone needs to stay with them for 24 hours.    Special instructions:   [place]- Preparing For Surgery  Before surgery, you can play an important role. Because skin is not sterile, your skin needs to be as free of germs as possible. You can reduce the number of germs on your skin by washing with CHG (chlorahexidine gluconate) Soap before surgery.  CHG is an antiseptic cleaner which kills germs and bonds with the skin to continue killing germs even after washing.    Oral Hygiene is also important to reduce your risk of infection.  Remember - BRUSH YOUR TEETH THE MORNING OF SURGERY WITH YOUR REGULAR TOOTHPASTE  Please do not use if you have an allergy to CHG or antibacterial soaps. If your skin becomes reddened/irritated stop using the CHG.  Do not shave (including legs and underarms) for at least 48 hours prior to first CHG shower. It is OK to shave your face.  Please follow these instructions carefully.   Shower the NIGHT BEFORE SURGERY and the MORNING OF SURGERY with DIAL  Soap.   Pat yourself dry with a CLEAN TOWEL.  Wear CLEAN PAJAMAS to bed the night before surgery  Place CLEAN SHEETS on your bed the night of your first shower and DO NOT SLEEP WITH PETS.   Day of Surgery: Please shower morning of surgery  Wear  Clean/Comfortable clothing the morning of surgery Do not apply any deodorants/lotions.   Remember to brush your teeth WITH YOUR REGULAR TOOTHPASTE.   Questions were answered. Patient verbalized understanding of instructions.

## 2023-09-07 ENCOUNTER — Encounter (HOSPITAL_COMMUNITY): Payer: Self-pay | Admitting: Vascular Surgery

## 2023-09-07 ENCOUNTER — Other Ambulatory Visit: Payer: Self-pay

## 2023-09-07 ENCOUNTER — Encounter (HOSPITAL_COMMUNITY)
Admission: RE | Disposition: A | Discharge status: Home / Self Care | Payer: Self-pay | Source: Home / Self Care | Attending: Vascular Surgery

## 2023-09-07 ENCOUNTER — Ambulatory Visit (HOSPITAL_COMMUNITY)
Admission: RE | Admit: 2023-09-07 | Discharge: 2023-09-07 | Disposition: A | Discharge status: Home / Self Care | Payer: BC Managed Care – PPO | Attending: Vascular Surgery | Admitting: Vascular Surgery

## 2023-09-07 ENCOUNTER — Ambulatory Visit (HOSPITAL_COMMUNITY): Payer: BC Managed Care – PPO | Admitting: Certified Registered"

## 2023-09-07 DIAGNOSIS — N185 Chronic kidney disease, stage 5: Secondary | ICD-10-CM | POA: Diagnosis not present

## 2023-09-07 DIAGNOSIS — N186 End stage renal disease: Secondary | ICD-10-CM | POA: Insufficient documentation

## 2023-09-07 DIAGNOSIS — Z794 Long term (current) use of insulin: Secondary | ICD-10-CM | POA: Insufficient documentation

## 2023-09-07 DIAGNOSIS — Z833 Family history of diabetes mellitus: Secondary | ICD-10-CM | POA: Insufficient documentation

## 2023-09-07 DIAGNOSIS — E1122 Type 2 diabetes mellitus with diabetic chronic kidney disease: Secondary | ICD-10-CM | POA: Insufficient documentation

## 2023-09-07 DIAGNOSIS — Z8249 Family history of ischemic heart disease and other diseases of the circulatory system: Secondary | ICD-10-CM | POA: Insufficient documentation

## 2023-09-07 DIAGNOSIS — I120 Hypertensive chronic kidney disease with stage 5 chronic kidney disease or end stage renal disease: Secondary | ICD-10-CM | POA: Diagnosis not present

## 2023-09-07 HISTORY — PX: AV FISTULA PLACEMENT: SHX1204

## 2023-09-07 LAB — POCT I-STAT, CHEM 8
BUN: 31 mg/dL — ABNORMAL HIGH (ref 6–20)
Calcium, Ion: 1.06 mmol/L — ABNORMAL LOW (ref 1.15–1.40)
Chloride: 99 mmol/L (ref 98–111)
Creatinine, Ser: 8.2 mg/dL — ABNORMAL HIGH (ref 0.61–1.24)
Glucose, Bld: 312 mg/dL — ABNORMAL HIGH (ref 70–99)
HCT: 35 % — ABNORMAL LOW (ref 39.0–52.0)
Hemoglobin: 11.9 g/dL — ABNORMAL LOW (ref 13.0–17.0)
Potassium: 4.3 mmol/L (ref 3.5–5.1)
Sodium: 136 mmol/L (ref 135–145)
TCO2: 25 mmol/L (ref 22–32)

## 2023-09-07 LAB — GLUCOSE, CAPILLARY
Glucose-Capillary: 262 mg/dL — ABNORMAL HIGH (ref 70–99)
Glucose-Capillary: 117 mg/dL — ABNORMAL HIGH (ref 70–99)
Glucose-Capillary: 91 mg/dL (ref 70–99)

## 2023-09-07 SURGERY — ARTERIOVENOUS (AV) FISTULA CREATION
Anesthesia: General | Site: Arm Lower | Laterality: Right

## 2023-09-07 MED ORDER — CHLORHEXIDINE GLUCONATE 0.12 % MT SOLN
15.0000 mL | Freq: Once | OROMUCOSAL | Status: AC
  Filled 2023-09-07: qty 15

## 2023-09-07 MED ORDER — LIDOCAINE 2% (20 MG/ML) 5 ML SYRINGE
INTRAMUSCULAR | Status: AC
  Filled 2023-09-07: qty 5

## 2023-09-07 MED ORDER — INSULIN ASPART 100 UNIT/ML IJ SOLN: 0.0000 [IU] | INTRAMUSCULAR | Status: DC | PRN

## 2023-09-07 MED ORDER — CHLORHEXIDINE GLUCONATE 4 % EX SOLN: 60.0000 mL | Freq: Once | CUTANEOUS | Status: DC

## 2023-09-07 MED ORDER — SODIUM CHLORIDE 0.9 % IV SOLN: INTRAVENOUS | Status: DC | PRN

## 2023-09-07 MED ORDER — OXYCODONE HCL 5 MG PO TABS: 5.0000 mg | ORAL_TABLET | Freq: Once | ORAL | Status: AC | PRN

## 2023-09-07 MED ORDER — CEFAZOLIN SODIUM-DEXTROSE 2-4 GM/100ML-% IV SOLN
INTRAVENOUS | Status: AC
  Filled 2023-09-07: qty 100

## 2023-09-07 MED ORDER — INSULIN ASPART 100 UNIT/ML IJ SOLN
INTRAMUSCULAR | Status: AC
  Administered 2023-09-07: 4 [IU] via SUBCUTANEOUS
  Filled 2023-09-07: qty 1

## 2023-09-07 MED ORDER — ORAL CARE MOUTH RINSE: 15.0000 mL | Freq: Once | OROMUCOSAL | Status: AC

## 2023-09-07 MED ORDER — HEPARIN 6000 UNIT IRRIGATION SOLUTION
Status: DC | PRN
  Administered 2023-09-07: 1

## 2023-09-07 MED ORDER — CHLORHEXIDINE GLUCONATE 4 % EX SOLN
60.0000 mL | Freq: Once | CUTANEOUS | Status: DC
Start: 2023-09-07 — End: 2023-09-07

## 2023-09-07 MED ORDER — OXYCODONE HCL 5 MG/5ML PO SOLN
5.0000 mg | Freq: Once | ORAL | Status: AC | PRN
  Administered 2023-09-07: 5 mg via ORAL

## 2023-09-07 MED ORDER — CEFAZOLIN SODIUM-DEXTROSE 2-4 GM/100ML-% IV SOLN
2.0000 g | INTRAVENOUS | Status: AC
  Administered 2023-09-07: 2 g via INTRAVENOUS

## 2023-09-07 MED ORDER — HEPARIN SODIUM (PORCINE) 1000 UNIT/ML IJ SOLN
INTRAMUSCULAR | Status: DC | PRN
  Administered 2023-09-07: 2000 [IU] via INTRAVENOUS

## 2023-09-07 MED ORDER — MIDAZOLAM HCL 2 MG/2ML IJ SOLN: 2.0000 mg | Freq: Once | INTRAMUSCULAR | Status: AC

## 2023-09-07 MED ORDER — MEPIVACAINE HCL (PF) 2 % IJ SOLN
INTRAMUSCULAR | Status: DC | PRN
  Administered 2023-09-07: 20 mL via EPIDURAL

## 2023-09-07 MED ORDER — OXYCODONE HCL 5 MG/5ML PO SOLN
ORAL | Status: AC
  Filled 2023-09-07: qty 5

## 2023-09-07 MED ORDER — FENTANYL CITRATE (PF) 100 MCG/2ML IJ SOLN
INTRAMUSCULAR | Status: AC
  Filled 2023-09-07: qty 2

## 2023-09-07 MED ORDER — 0.9 % SODIUM CHLORIDE (POUR BTL) OPTIME
TOPICAL | Status: DC | PRN
  Administered 2023-09-07: 1000 mL

## 2023-09-07 MED ORDER — CHLORHEXIDINE GLUCONATE 0.12 % MT SOLN
OROMUCOSAL | Status: AC
  Administered 2023-09-07: 15 mL via OROMUCOSAL
  Filled 2023-09-07: qty 15

## 2023-09-07 MED ORDER — MIDAZOLAM HCL 2 MG/2ML IJ SOLN
INTRAMUSCULAR | Status: AC
  Administered 2023-09-07: 2 mg via INTRAVENOUS
  Filled 2023-09-07: qty 2

## 2023-09-07 MED ORDER — OXYCODONE-ACETAMINOPHEN 5-325 MG PO TABS: 1.0000 | ORAL_TABLET | ORAL | 0 refills | Status: DC | PRN

## 2023-09-07 MED ORDER — PROPOFOL 500 MG/50ML IV EMUL
INTRAVENOUS | Status: DC | PRN
  Administered 2023-09-07: 50 ug/kg/min via INTRAVENOUS

## 2023-09-07 MED ORDER — EPHEDRINE 5 MG/ML INJ
INTRAVENOUS | Status: AC
  Filled 2023-09-07: qty 5

## 2023-09-07 MED ORDER — FENTANYL CITRATE (PF) 100 MCG/2ML IJ SOLN
25.0000 ug | INTRAMUSCULAR | Status: DC | PRN
  Administered 2023-09-07: 50 ug via INTRAVENOUS

## 2023-09-07 MED ORDER — PHENYLEPHRINE 80 MCG/ML (10ML) SYRINGE FOR IV PUSH (FOR BLOOD PRESSURE SUPPORT)
PREFILLED_SYRINGE | INTRAVENOUS | Status: AC
  Filled 2023-09-07: qty 10

## 2023-09-07 MED ORDER — HEPARIN 6000 UNIT IRRIGATION SOLUTION
Status: AC
  Filled 2023-09-07: qty 500

## 2023-09-07 SURGICAL SUPPLY — 36 items
APPLIER CLIP 9.375 MED OPEN (MISCELLANEOUS) ×1
APPLIER CLIP 9.375 SM OPEN (CLIP) ×1
ARMBAND PINK RESTRICT EXTREMIT (MISCELLANEOUS) ×1 IMPLANT
BAG COUNTER SPONGE SURGICOUNT (BAG) ×1 IMPLANT
BLADE CLIPPER SURG (BLADE) ×1 IMPLANT
BNDG ELASTIC 4X5.8 VLCR STR LF (GAUZE/BANDAGES/DRESSINGS) ×1 IMPLANT
CANISTER SUCT 3000ML PPV (MISCELLANEOUS) ×1 IMPLANT
CLIP APPLIE 9.375 MED OPEN (MISCELLANEOUS) ×1 IMPLANT
CLIP APPLIE 9.375 SM OPEN (CLIP) ×1 IMPLANT
CLIP TI MEDIUM 6 (CLIP) ×2 IMPLANT
CLIP TI WIDE RED SMALL 6 (CLIP) ×1 IMPLANT
COVER PROBE W GEL 5X96 (DRAPES) ×1 IMPLANT
DERMABOND ADVANCED .7 DNX12 (GAUZE/BANDAGES/DRESSINGS) ×1 IMPLANT
DERMABOND ADVANCED .7 DNX6 (GAUZE/BANDAGES/DRESSINGS) IMPLANT
ELECT REM PT RETURN 9FT ADLT (ELECTROSURGICAL) ×1
ELECTRODE REM PT RTRN 9FT ADLT (ELECTROSURGICAL) ×1 IMPLANT
GLOVE BIOGEL PI IND STRL 8 (GLOVE) ×1 IMPLANT
GOWN STRL REUS W/ TWL LRG LVL3 (GOWN DISPOSABLE) ×2 IMPLANT
GOWN STRL REUS W/TWL 2XL LVL3 (GOWN DISPOSABLE) ×2 IMPLANT
KIT BASIN OR (CUSTOM PROCEDURE TRAY) ×1 IMPLANT
KIT TURNOVER KIT B (KITS) ×1 IMPLANT
NS IRRIG 1000ML POUR BTL (IV SOLUTION) ×1 IMPLANT
PACK CV ACCESS (CUSTOM PROCEDURE TRAY) ×1 IMPLANT
PAD ARMBOARD 7.5X6 YLW CONV (MISCELLANEOUS) ×2 IMPLANT
SLING ARM FOAM STRAP LRG (SOFTGOODS) IMPLANT
SLING ARM FOAM STRAP MED (SOFTGOODS) IMPLANT
SPIKE FLUID TRANSFER (MISCELLANEOUS) ×1 IMPLANT
SUT MNCRL AB 4-0 PS2 18 (SUTURE) ×1 IMPLANT
SUT PROLENE 6 0 BV (SUTURE) ×1 IMPLANT
SUT PROLENE 7 0 BV 1 (SUTURE) IMPLANT
SUT SILK 2 0 SH (SUTURE) IMPLANT
SUT SILK 3 0 SH CR/8 (SUTURE) ×1 IMPLANT
SUT VIC AB 3-0 SH 27X BRD (SUTURE) ×1 IMPLANT
TOWEL GREEN STERILE (TOWEL DISPOSABLE) ×1 IMPLANT
UNDERPAD 30X36 HEAVY ABSORB (UNDERPADS AND DIAPERS) ×1 IMPLANT
WATER STERILE IRR 1000ML POUR (IV SOLUTION) ×1 IMPLANT

## 2023-09-07 NOTE — Op Note (Signed)
    NAME: Adam Davis    MRN: [id number] DOB: 02-09-1976    DATE OF OPERATION: 09/07/2023  PREOP DIAGNOSIS:    End stage renal disease  POSTOP DIAGNOSIS:    Same  PROCEDURE:    Right arm brachiocephalic fistula Right radial artery endarterectomy  SURGEON: Fonda FORBES Rim  ASSIST: Curry Lesser  ANESTHESIA: General   EBL: 30ml  INDICATIONS:    Adam Davis is a 48 y.o. male with history of end-stage renal disease currently with tunneled HD line in need of long-term HD access. 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 After discussing risk benefits of right arm brachiocephalic fistula versus graft, Adam Davis elected to proceed.   FINDINGS:   Right arm high brachial bifurcation, intimal hyperplasia in the native radial artery 5mm sclerotic cephalic vein  TECHNIQUE:   The patient was brought to the operating room and placed in supine position. The right arm was prepped and draped in standard fashion. IV antibiotics were administered. A timeout was performed.   The case began with ultrasound insonation of the brachial artery and cephalic vein. There was a high brachial bifurcation. The radial artery and cephalic vein demonstrated sufficient size at the antecubital fossa for arteriovenous fistula.   A transverse incision was made below the elbow creese in the antecubital fossa. The cephalic vein was isolated for 5 cm in length. Next the aponeurosis was partially released and the brachial artery secured with a vessel loop. The patient was heparinized. The cephalic vein was transected and ligated distally with a 2-0 silk stick-tie. The vein was dilated with coronary dilators and flushed with heparin  saline. Vascular clamps were placed proximally and distally on the radial artery. A longitudinal incision was made and there was significant intimal hyperplasia. I elected to perform an endarterectomy. Next,  a 5 mm arteriotomy was created on the  brachial artery. This was flushed with heparin  saline.  An anastomosis was created in end to side fashion on the brachial artery using running 6-0 Prolene suture.  Prior to completing the anastomosis, the vessels were flushed and the suture line was tied down. There was an excellent thrill in the cephalic vein from the anastomosis to the mid upper bicipital region. The patient had a multiphasic ulnar signal, monophasic radial signal. Signal in the palmer arch was triphasic. He had an excellent doppler signal in the fistula. The incision was irrigated and hemostasis achieved with cautery and suture. The deeper tissue was closed with 3-0 Vicryl and the skin closed with 4-0 Monocryl.  Dermabond was applied to the incisions. The patient was transferred to PACU in stable condition.  Given the complexity of the case,  the assistant was necessary in order to expedient the procedure and safely perform the technical aspects of the operation.  The assistant provided traction and countertraction to assist with exposure of the artery and vein.  They also assisted with suture ligation of multiple venous branches.  They played a critical role in the anastomosis. These skills, especially following the Prolene suture for the anastomosis, could not have been adequately performed by a scrub tech assistant.    Fonda FORBES Rim, MD [facility] DATE OF DICTATION:   09/07/2023

## 2023-09-07 NOTE — Anesthesia Postprocedure Evaluation (Signed)
 Anesthesia Post Note  Patient: Adam Davis  Procedure(s) Performed: RIGHT BRACHIOCEPHALIC FISTULA (Right: Arm Lower)     Patient location during evaluation: PACU Anesthesia Type: General Level of consciousness: awake and alert Pain management: pain level controlled Vital Signs Assessment: post-procedure vital signs reviewed and stable Respiratory status: spontaneous breathing, nonlabored ventilation, respiratory function stable and patient connected to nasal cannula oxygen Cardiovascular status: blood pressure returned to baseline and stable Postop Assessment: no apparent nausea or vomiting Anesthetic complications: no   No notable events documented.  Last Vitals:  Vitals:   09/07/23 1330 09/07/23 1345  BP: (!) 178/98 (!) 185/95  Pulse: 89 91  Resp: 13 15  Temp:  36.7 C  SpO2: 97% 98%    Last Pain:  Vitals:   09/07/23 1330  PainSc: 5                  Lynwood MARLA Cornea

## 2023-09-07 NOTE — H&P (Signed)
 Hospital Consult   MRN #:  [id number]  History of Present Illness: This is a 48 y.o. male with history of end-stage renal disease currently with tunneled HD line in need of long-term HD access.  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    No complaints.   Past Medical History:  Diagnosis Date   Diabetes mellitus    H/O acute post-streptococcal glomerulonephritis 1997   History of renal transplant    Hyperlipidemia    Hypertension    Onychomycosis of toenail    Renal disorder    Tachycardia     Past Surgical History:  Procedure Laterality Date   ANKLE SURGERY     AV FISTULA PLACEMENT  1997   Removed after transplant in 2006   INSERTION OF DIALYSIS CATHETER Right 08/06/2023   Procedure: INSERTION OF TUNNELED DIALYSIS CATHETER, RIGHT INTERNAL JUGULAR;  Surgeon: Lanis Fonda BRAVO, MD;  Location: [facility] OR;  Service: Vascular;  Laterality: Right;   IR FLUORO GUIDE CV LINE RIGHT  08/01/2023   IR US  GUIDE VASC ACCESS RIGHT  08/01/2023   KIDNEY SURGERY  2006   KIDNEY TRANSPLANT      Allergies  Allergen Reactions   Atorvastatin  Other (See Comments)    Myalgia - resolved with discontinuation    Prior to Admission medications   Medication Sig Start Date End Date Taking? Authorizing Provider  amLODipine  (NORVASC ) 10 MG tablet Take 10 mg by mouth daily.   Yes [provider]  calcitRIOL  (ROCALTROL ) 0.25 MCG capsule Take 1 capsule (0.25 mcg total) by mouth daily. 08/07/23  Yes Romelle Booty, MD  HYDROcodone -acetaminophen  (NORCO/VICODIN) 5-325 MG tablet Take 1 tablet by mouth every 4 (four) hours as needed for moderate pain (pain score 4-6). 08/07/23  Yes Theophilus Pagan, MD  insulin  glargine-yfgn (SEMGLEE ) 100 UNIT/ML Pen Inject 4 Units into the skin daily. Expires 28 days after first use 08/07/23  Yes Theophilus Pagan, MD  multivitamin (RENA-VIT) TABS tablet Take 1 tablet by mouth daily. 08/06/23  Yes Romelle Booty, MD  mycophenolate  (CELLCEPT )  250 MG capsule Take 1 capsule (250 mg total) by mouth 2 (two) times daily. 08/06/23  Yes Romelle Booty, MD  rosuvastatin  (CRESTOR ) 10 MG tablet Take one tablet every Monday, Wednesday, and Friday. Patient taking differently: Take 5 mg by mouth every Monday, Wednesday, and Friday. 01/16/23  Yes [name], [name], DO  sevelamer  carbonate (RENVELA ) 800 MG tablet Take 1 tablet (800 mg total) by mouth 3 (three) times daily with meals. 08/07/23  Yes Theophilus Pagan, MD  tacrolimus  (PROGRAF ) 0.5 MG capsule Take 1 capsule (0.5 mg total) by mouth 2 (two) times daily. 08/06/23  Yes Romelle Booty, MD  Blood Glucose Monitoring Suppl (BLOOD GLUCOSE MONITOR SYSTEM) w/Device KIT Use in the morning, at noon, and at bedtime. 08/07/23   Theophilus Pagan, MD  Darbepoetin Alfa  (ARANESP ) 100 MCG/0.5ML SOSY injection Inject 0.5 mLs (100 mcg total) into the skin every Wednesday at 6 PM. Patient not taking: Reported on 08/09/2023 08/08/23   Theophilus Pagan, MD  Glucose Blood (BLOOD GLUCOSE TEST STRIPS) STRP Use in the morning, at noon, and at bedtime. 08/07/23 09/09/23  Theophilus Pagan, MD  Insulin  Pen Needle 32G X 4 MM MISC Use with Semglee  pen 08/07/23     atorvastatin  (LIPITOR) 40 MG tablet Take 1 tablet (40 mg total) by mouth daily. Patient not taking: Reported on 07/09/2017 07/13/16 05/09/19  Doy Neville MATSU, MD    Social History   Socioeconomic History   Marital status: Married  Spouse name: Not on file   Number of children: Not on file   Years of education: Not on file   Highest education level: Not on file  Occupational History   Not on file  Tobacco Use   Smoking status: Never   Smokeless tobacco: Never  Vaping Use   Vaping status: Never Used  Substance and Sexual Activity   Alcohol use: No   Drug use: No   Sexual activity: Not Currently  Other Topics Concern   Not on file  Social History Narrative   Not on file   Social Drivers of Health   Financial Resource Strain: Not on file   Food Insecurity: No Food Insecurity (08/09/2023)   Hunger Vital Sign    Worried About Running Out of Food in the Last Year: Never true    Ran Out of Food in the Last Year: Never true  Transportation Needs: No Transportation Needs (08/09/2023)   PRAPARE - Administrator, Civil Service (Medical): No    Lack of Transportation (Non-Medical): No  Physical Activity: Not on file  Stress: Not on file  Social Connections: Not on file  Intimate Partner Violence: Not At Risk (08/09/2023)   Humiliation, Afraid, Rape, and Kick questionnaire    Fear of Current or Ex-Partner: No    Emotionally Abused: No    Physically Abused: No    Sexually Abused: No   Family History  Problem Relation Age of Onset   Diabetes Brother    Diabetes Maternal Grandmother    Hypertension Maternal Grandmother     ROS: Otherwise negative unless mentioned in HPI  Physical Examination  Vitals:   09/07/23 0831 09/07/23 0900  BP: (!) 174/112 (!) 182/110  Pulse:  98  Resp:    Temp: 98.4 F (36.9 C)   SpO2:     Body mass index is 23.4 kg/m.  General:  WDWN in NAD Gait: Not observed HENT: WNL, normocephalic Pulmonary: normal non-labored breathing, without Rales, rhonchi,  wheezing Cardiac: regular, Abdomen:  soft, NT/ND, no masses Skin: without rashes Vascular Exam/Pulses: 2+ radials Extremities: without ischemic changes, without Gangrene , without cellulitis; without open wounds;  Musculoskeletal: no muscle wasting or atrophy  Neurologic: A&O X 3;  No focal weakness or paresthesias are detected; speech is fluent/normal Psychiatric:  The pt has Normal affect. Lymph:  Unremarkable  CBC    Component Value Date/Time   WBC 12.6 (H) 08/07/2023 0911   RBC 2.98 (L) 08/07/2023 0911   HGB 11.9 (L) 09/07/2023 0919   HGB 14.5 02/06/2017 1648   HCT 35.0 (L) 09/07/2023 0919   HCT 41.5 02/06/2017 1648   PLT 205 08/07/2023 0911   PLT 171 02/06/2017 1648   MCV 74.8 (L) 08/07/2023 0911   MCV 73 (L)  02/06/2017 1648   MCH 24.8 (L) 08/07/2023 0911   MCHC 33.2 08/07/2023 0911   RDW 14.3 08/07/2023 0911   RDW 14.8 02/06/2017 1648   LYMPHSABS 1.1 08/07/2023 0911   LYMPHSABS 2.0 02/06/2017 1648   MONOABS 1.3 (H) 08/07/2023 0911   EOSABS 0.3 08/07/2023 0911   EOSABS 0.1 02/06/2017 1648   BASOSABS 0.0 08/07/2023 0911   BASOSABS 0.0 02/06/2017 1648    BMET    Component Value Date/Time   NA 136 09/07/2023 0919   NA 140 11/07/2022 1700   K 4.3 09/07/2023 0919   CL 99 09/07/2023 0919   CO2 21 (L) 08/07/2023 0911   GLUCOSE 312 (H) 09/07/2023 0919   BUN 31 (H) 09/07/2023  0919   BUN 36 (H) 11/07/2022 1700   CREATININE 8.20 (H) 09/07/2023 0919   CREATININE 1.82 (H) 04/02/2013 1418   CALCIUM  7.5 (L) 08/07/2023 0911   CALCIUM  9.5 10/20/2010 2227   GFRNONAA 4 (L) 08/07/2023 0911   GFRAA 68 09/19/2019 1427    COAGS: Lab Results  Component Value Date   INR 1.2 07/31/2023      ASSESSMENT/PLAN: This is a 49 y.o. male with history of end-stage renal disease currently with tunneled HD line in need of long-term HD access.  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  After discussing risk benefits of right arm brachiocephalic fistula versus graft, [name] elected to proceed.   Fonda FORBES Rim MD MS [facility] [phone] 09/07/2023  10:51 AM

## 2023-09-07 NOTE — Transfer of Care (Signed)
 Immediate Anesthesia Transfer of Care Note  Patient: Adam Davis  Procedure(s) Performed: RIGHT BRACHIOCEPHALIC FISTULA (Right: Arm Lower)  Patient Location: PACU  Anesthesia Type:MAC  Level of Consciousness: awake, alert , and oriented  Airway & Oxygen Therapy: Patient Spontanous Breathing  Post-op Assessment: Report given to RN and Post -op Vital signs reviewed and stable  Post vital signs: Reviewed and stable  Last Vitals:  Vitals Value Taken Time  BP 194/97 09/07/23 1245  Temp    Pulse 95 09/07/23 1246  Resp 16 09/07/23 1246  SpO2 100 % 09/07/23 1246  Vitals shown include unfiled device data.  Last Pain:  Vitals:   09/07/23 1240  PainSc: Asleep         Complications: No notable events documented.

## 2023-09-07 NOTE — Discharge Instructions (Signed)
   [facility]  Discharge Instructions  AV Fistula or Graft Surgery for Dialysis Access  Please refer to the following instructions for your post-procedure care. Your surgeon or physician assistant will discuss any changes with you.  Activity  You may drive the day following your surgery, if you are comfortable and no longer taking prescription pain medication. Resume full activity as the soreness in your incision resolves.  Bathing/Showering  You may shower after you go home. Keep your incision dry for 48 hours. Do not soak in a bathtub, hot tub, or swim until the incision heals completely. You may not shower if you have a hemodialysis catheter.  Incision Care  Clean your incision with mild soap and water after 48 hours. Pat the area dry with a clean towel. You do not need a bandage unless otherwise instructed. Do not apply any ointments or creams to your incision. You may have skin glue on your incision. Do not peel it off. It will come off on its own in about one week. Your arm may swell a bit after surgery. To reduce swelling use pillows to elevate your arm so it is above your heart. Your doctor will tell you if you need to lightly wrap your arm with an ACE bandage.  Diet  Resume your normal diet. There are not special food restrictions following this procedure. In order to heal from your surgery, it is CRITICAL to get adequate nutrition. Your body requires vitamins, minerals, and protein. Vegetables are the best source of vitamins and minerals. Vegetables also provide the perfect balance of protein. Processed food has little nutritional value, so try to avoid this.  Medications  Resume taking all of your medications. If your incision is causing pain, you may take over-the counter pain relievers such as acetaminophen  (Tylenol ). If you were prescribed a stronger pain medication, please be aware these medications can cause nausea and constipation. Prevent  nausea by taking the medication with a snack or meal. Avoid constipation by drinking plenty of fluids and eating foods with high amount of fiber, such as fruits, vegetables, and grains.  Do not take Tylenol  if you are taking prescription pain medications.  Follow up Your surgeon may want to see you in the office following your access surgery. If so, this will be arranged at the time of your surgery.  Please call us  immediately for any of the following conditions:  Increased pain, redness, drainage (pus) from your incision site Fever of 101 degrees or higher Severe or worsening pain at your incision site Hand pain or numbness.  Reduce your risk of vascular disease:  Stop smoking. If you would like help, call [facility] at 1-800-QUIT-NOW (6136959889) or [place] at [phone]  Manage your cholesterol Maintain a desired weight Control your diabetes Keep your blood pressure down  Dialysis  It will take several weeks to several months for your new dialysis access to be ready for use. Your surgeon will determine when it is okay to use it. Your nephrologist will continue to direct your dialysis. You can continue to use your Permcath until your new access is ready for use.   09/07/2023 Adam Davis [id number] Feb 07, 1976  Surgeon(s): Lanis Fonda BRAVO, MD  Procedure(s): RIGHT BRACHIOCEPHALIC FISTULA   May stick graft immediately   May stick graft on designated area only:   X Do not stick right AV fistula for 12 weeks    If you have any questions, please call the office at [phone].

## 2023-09-07 NOTE — Anesthesia Procedure Notes (Signed)
 Procedure Name: MAC Date/Time: 09/07/2023 11:27 AM  Performed by: Delores Dus, CRNAPre-anesthesia Checklist: Patient identified, Emergency Drugs available, Suction available and Patient being monitored Patient Re-evaluated:Patient Re-evaluated prior to induction Oxygen Delivery Method: Simple face mask Dental Injury: Teeth and Oropharynx as per pre-operative assessment

## 2023-09-07 NOTE — Anesthesia Procedure Notes (Signed)
 Anesthesia Regional Block: Supraclavicular block   Pre-Anesthetic Checklist: , timeout performed,  Correct Patient, Correct Site, Correct Laterality,  Correct Procedure, Correct Position, site marked,  Risks and benefits discussed,  Pre-op  evaluation,  At surgeon's request and post-op pain management  Laterality: Right  Prep: Maximum Sterile Barrier Precautions used, chloraprep       Needles:  Injection technique: Single-shot  Needle Type: Echogenic Stimulator Needle     Needle Length: 5cm  Needle Gauge: 22     Additional Needles:   Procedures:, nerve stimulator,,, ultrasound used (permanent image in chart),,     Nerve Stimulator or Paresthesia:  Response: Biceps response  Additional Responses:   Narrative:  Start time: 09/07/2023 11:00 AM End time: 09/07/2023 11:05 AM Injection made incrementally with aspirations every 5 mL. Anesthesiologist: Epifanio Fallow, MD

## 2023-09-07 NOTE — Anesthesia Preprocedure Evaluation (Addendum)
 Anesthesia Evaluation  Patient identified by MRN, date of birth, ID band Patient awake    Reviewed: Allergy & Precautions, NPO status , Patient's Chart, lab work & pertinent test results, reviewed documented beta blocker date and time   History of Anesthesia Complications Negative for: history of anesthetic complications  Airway Mallampati: II  TM Distance: >3 FB     Dental no notable dental hx.    Pulmonary neg COPD, neg PE   breath sounds clear to auscultation       Cardiovascular hypertension, (-) angina (-) CAD and (-) Past MI  Rhythm:Regular Rate:Normal     Neuro/Psych neg Seizures    GI/Hepatic ,,,(+) neg Cirrhosis        Endo/Other  diabetes, Type 2    Renal/GU ESRFRenal disease     Musculoskeletal   Abdominal   Peds  Hematology  (+) Blood dyscrasia, anemia   Anesthesia Other Findings   Reproductive/Obstetrics                              Anesthesia Physical Anesthesia Plan  ASA: 3  Anesthesia Plan: General   Post-op Pain Management: Regional block*   Induction: Intravenous  PONV Risk Score and Plan: 2 and Ondansetron   Airway Management Planned: LMA  Additional Equipment:   Intra-op Plan:   Post-operative Plan: Extubation in OR  Informed Consent: I have reviewed the patients History and Physical, chart, labs and discussed the procedure including the risks, benefits and alternatives for the proposed anesthesia with the patient or authorized representative who has indicated his/her understanding and acceptance.     Dental advisory given  Plan Discussed with: CRNA  Anesthesia Plan Comments:          Anesthesia Quick Evaluation

## 2023-09-08 ENCOUNTER — Encounter (HOSPITAL_COMMUNITY): Payer: Self-pay | Admitting: Vascular Surgery

## 2023-09-10 ENCOUNTER — Ambulatory Visit: Payer: BC Managed Care – PPO | Admitting: Pharmacist

## 2023-09-11 DIAGNOSIS — N186 End stage renal disease: Secondary | ICD-10-CM | POA: Diagnosis not present

## 2023-09-11 DIAGNOSIS — T8249XA Other complication of vascular dialysis catheter, initial encounter: Secondary | ICD-10-CM | POA: Diagnosis not present

## 2023-09-11 DIAGNOSIS — Z992 Dependence on renal dialysis: Secondary | ICD-10-CM | POA: Diagnosis not present

## 2023-09-13 DIAGNOSIS — Z992 Dependence on renal dialysis: Secondary | ICD-10-CM | POA: Diagnosis not present

## 2023-09-13 DIAGNOSIS — N186 End stage renal disease: Secondary | ICD-10-CM | POA: Diagnosis not present

## 2023-09-13 DIAGNOSIS — T8249XA Other complication of vascular dialysis catheter, initial encounter: Secondary | ICD-10-CM | POA: Diagnosis not present

## 2023-09-15 DIAGNOSIS — N186 End stage renal disease: Secondary | ICD-10-CM | POA: Diagnosis not present

## 2023-09-15 DIAGNOSIS — Z992 Dependence on renal dialysis: Secondary | ICD-10-CM | POA: Diagnosis not present

## 2023-09-15 DIAGNOSIS — T8249XA Other complication of vascular dialysis catheter, initial encounter: Secondary | ICD-10-CM | POA: Diagnosis not present

## 2023-09-17 ENCOUNTER — Encounter: Payer: Self-pay | Admitting: Pharmacist

## 2023-09-17 ENCOUNTER — Ambulatory Visit (INDEPENDENT_AMBULATORY_CARE_PROVIDER_SITE_OTHER): Payer: BC Managed Care – PPO | Admitting: Pharmacist

## 2023-09-17 VITALS — BP 168/87 | HR 103 | Wt 147.0 lb

## 2023-09-17 DIAGNOSIS — Z794 Long term (current) use of insulin: Secondary | ICD-10-CM | POA: Diagnosis not present

## 2023-09-17 DIAGNOSIS — E1165 Type 2 diabetes mellitus with hyperglycemia: Secondary | ICD-10-CM | POA: Diagnosis not present

## 2023-09-17 MED ORDER — INSULIN GLARGINE-YFGN 100 UNIT/ML ~~LOC~~ SOPN: 6.0000 [IU] | PEN_INJECTOR | Freq: Every day | SUBCUTANEOUS | Status: DC

## 2023-09-17 MED ORDER — FREESTYLE LIBRE 3 PLUS SENSOR MISC: 11 refills | Status: DC

## 2023-09-17 MED ORDER — FREESTYLE LIBRE 3 READER DEVI: 1.0000 | Freq: Once | 0 refills | Status: AC

## 2023-09-17 NOTE — Patient Instructions (Signed)
It was nice to see you today!  Your goal blood sugar is 80-130 before eating and less than 180 after eating.  Medication Changes:  Increase Semglee (insulin glargine-yfgn) from 4 units daily to 6 units daily.   Continue all other medication the same.   Keep up the good work with diet and exercise. Aim for a diet full of vegetables, fruit and lean meats (chicken, [place], fish). Try to limit salt intake by eating fresh or frozen vegetables (instead of canned), rinse canned vegetables prior to cooking and do not add any additional salt to meals.

## 2023-09-17 NOTE — Assessment & Plan Note (Signed)
Diabetes longstanding currently uncontrolled. Patient is able to verbalize appropriate hypoglycemia management plan. Medication adherence appears good. Control is suboptimal due to low dose of insulin. -Increased dose of basal insulin Semglee (insulin glargine) from 4 units to 6 units daily in the morning.  - Spent significant time explaining CGM use - patient agreed to try.  Unfortunately phone was not compatible with APP use.  Patient will obtain Reader and sensors then we will complete set-up.  Extensively discussed pathophysiology of diabetes, recommended lifestyle interventions, dietary effects on blood sugar control.

## 2023-09-17 NOTE — Progress Notes (Signed)
S:     Chief Complaint  Patient presents with   Medication Management    Meds + DM   48 y.o. male who presents for diabetes evaluation, education, and management. Patient arrives in good spirits and presents without any assistance.   Patient was referred and last seen by Primary Care Provider, Dr. Velna Ochs, on 08/24/2023.   PMH is significant for diabetes.   Current diabetes medications include: Semglee (insulin glargine-yfgn) 4 units daily.  Current hypertension medications include: amlodipine 10 mg daily Current hyperlipidemia medications include: rosuvastatin 10 mg 1 tab every Monday, Wednesday, and Friday  Patient reports adherence to taking all medications as prescribed.   Do you feel that your medications are working for you? yes Have you been experiencing any side effects to the medications prescribed? no Do you have any problems obtaining medications due to transportation or finances? no Insurance coverage: BCBS  Patient denies hypoglycemic events.  Patient reports nocturia (nighttime urination).  Patient denies neuropathy (nerve pain).   O:   Review of Systems  All other systems reviewed and are negative.   Physical Exam Vitals reviewed.  Constitutional:      Appearance: Normal appearance. He is normal weight.  Pulmonary:     Effort: Pulmonary effort is normal.  Neurological:     Mental Status: He is alert.  Psychiatric:        Behavior: Behavior normal.        Thought Content: Thought content normal.        Judgment: Judgment normal.    Lab Results  Component Value Date   HGBA1C 6.8 (H) 08/01/2023   Vitals:   09/17/23 1034  BP: (!) 168/87  Pulse: (!) 103  SpO2: 100%    Lipid Panel     Component Value Date/Time   CHOL 226 (H) 11/07/2022 1700   TRIG 116 11/07/2022 1700   HDL 49 11/07/2022 1700   CHOLHDL 4.6 11/07/2022 1700   CHOLHDL 6.4 07/04/2012 0957   VLDL 55 (H) 07/04/2012 0957   LDLCALC 156 (H) 11/07/2022 1700    Clinical  Atherosclerotic Cardiovascular Disease (ASCVD): Yes  The 10-year ASCVD risk score (Arnett DK, et al., 2019) is: 24.3%   Values used to calculate the score:     Age: 41 years     Sex: Male     Is Non-Hispanic African American: Yes     Diabetic: Yes     Tobacco smoker: No     Systolic Blood Pressure: 168 mmHg     Is BP treated: Yes     HDL Cholesterol: 49 mg/dL     Total Cholesterol: 226 mg/dL   Patient is participating in a Managed Medicaid Plan:  Yes   A/P: Diabetes longstanding currently uncontrolled. Patient is able to verbalize appropriate hypoglycemia management plan. Medication adherence appears good. Control is suboptimal due to low dose of insulin. -Increased dose of basal insulin Semglee (insulin glargine) from 4 units to 6 units daily in the morning.  - Spent significant time explaining CGM use - patient agreed to try.  Unfortunately phone was not compatible with APP use.  Patient will obtain Reader and sensors then we will complete set-up.  Extensively discussed pathophysiology of diabetes, recommended lifestyle interventions, dietary effects on blood sugar control.  -Counseled on s/sx of and management of hypoglycemia.    Written patient instructions provided. Patient verbalized understanding of treatment plan.  Total time in face to face counseling 34 minutes.    Follow-up:  Pharmacist 09/24/2023  with Dr. Raymondo Band - for CGM initiation.  PCP clinic visit in PRN Patient seen with Lavona Mound, PharmD Candidate.

## 2023-09-17 NOTE — Progress Notes (Signed)
Reviewed and agree with Dr Koval's plan.   

## 2023-09-18 ENCOUNTER — Telehealth: Payer: Self-pay

## 2023-09-18 DIAGNOSIS — Z992 Dependence on renal dialysis: Secondary | ICD-10-CM | POA: Diagnosis not present

## 2023-09-18 DIAGNOSIS — N186 End stage renal disease: Secondary | ICD-10-CM | POA: Diagnosis not present

## 2023-09-18 DIAGNOSIS — L299 Pruritus, unspecified: Secondary | ICD-10-CM | POA: Diagnosis not present

## 2023-09-18 DIAGNOSIS — R52 Pain, unspecified: Secondary | ICD-10-CM | POA: Diagnosis not present

## 2023-09-18 DIAGNOSIS — T8249XA Other complication of vascular dialysis catheter, initial encounter: Secondary | ICD-10-CM | POA: Diagnosis not present

## 2023-09-18 NOTE — Telephone Encounter (Signed)
Patient calls nurse line requesting medication refill on hydrocodone. He states that he has been having pain at fistula site. He denies bleeding at site.   He states that he spoke with Dr. Raymondo Band yesterday regarding this and wanted to check the status.   Advised that he may need appointment for further evaluation of fistula site, however, would forward message to PCP for further advisement.   Veronda Prude, RN

## 2023-09-19 NOTE — Telephone Encounter (Signed)
Discussed pain management issue with attending, Dr. Linwood Dibbles.  Visit for evaluation was suggested.   Patient contacted for follow-up of pain for arm - medication request.   Since last contact patient reports his arm has less pain today.    We discussed options of  - being seen here at [facility] for evaluation of fistula site  OR  - being evaluated at dialysis session tomorrow  He was willing to reevaluate pain tomorrow and consider options.  Offered appointment tomorrow but was not interested in scheduling at this time.    Total time with patient call and documentation of interaction: 11 minutes.

## 2023-09-20 DIAGNOSIS — N186 End stage renal disease: Secondary | ICD-10-CM | POA: Diagnosis not present

## 2023-09-20 DIAGNOSIS — Z992 Dependence on renal dialysis: Secondary | ICD-10-CM | POA: Diagnosis not present

## 2023-09-20 DIAGNOSIS — T8249XA Other complication of vascular dialysis catheter, initial encounter: Secondary | ICD-10-CM | POA: Diagnosis not present

## 2023-09-20 DIAGNOSIS — R52 Pain, unspecified: Secondary | ICD-10-CM | POA: Diagnosis not present

## 2023-09-20 DIAGNOSIS — L299 Pruritus, unspecified: Secondary | ICD-10-CM | POA: Diagnosis not present

## 2023-09-20 NOTE — Telephone Encounter (Signed)
Reviewed and agree with Dr Koval's plan.   

## 2023-09-21 ENCOUNTER — Encounter (HOSPITAL_COMMUNITY): Payer: Self-pay | Admitting: Emergency Medicine

## 2023-09-21 ENCOUNTER — Ambulatory Visit (HOSPITAL_COMMUNITY)
Admission: EM | Admit: 2023-09-21 | Discharge: 2023-09-21 | Disposition: A | Discharge status: Home / Self Care | Payer: BC Managed Care – PPO | Attending: Emergency Medicine | Admitting: Emergency Medicine

## 2023-09-21 DIAGNOSIS — M545 Low back pain, unspecified: Secondary | ICD-10-CM | POA: Diagnosis not present

## 2023-09-21 MED ORDER — DEXAMETHASONE SODIUM PHOSPHATE 10 MG/ML IJ SOLN
INTRAMUSCULAR | Status: AC
  Filled 2023-09-21: qty 1

## 2023-09-21 MED ORDER — DEXAMETHASONE SODIUM PHOSPHATE 10 MG/ML IJ SOLN
10.0000 mg | Freq: Once | INTRAMUSCULAR | Status: AC
  Administered 2023-09-21: 10 mg via INTRAMUSCULAR

## 2023-09-21 NOTE — ED Triage Notes (Signed)
Pt reports back and bilateral leg pain for a couple days. Tried Tylenol. Denies injury, lifting, falls.

## 2023-09-21 NOTE — Discharge Instructions (Signed)
We have given you a steroid injection in clinic to help with pain and inflammation.  You can continue to take Tylenol as well.  Heat and gentle stretching may help as well.  I believe the pain from your back is radiating into your leg from nerve compression.  If the steroid shot does not help within the next few hours or if you develop any new concerning symptoms or worsening pain, please head to the nearest emergency department for further advanced evaluation.

## 2023-09-21 NOTE — ED Provider Notes (Signed)
[facility]    CSN: [id number] Arrival date & time: 09/21/23  1055      History   Chief Complaint Chief Complaint  Patient presents with   Back Pain   Leg Pain    HPI Adam Davis is a 48 y.o. male.   Patient presents to clinic for bilateral lower back pain that radiates down into his legs intermittently and anteriorly.  He has not had any trauma, falls or history of back issues.  He does have end-stage renal disease and is on dialysis.    Pain has been present for the past 2 days.  While he was at dialysis yesterday someone suggested that it may be sciatica and that he should get a steroid injection.  Is not having any incontinence, bowel or bladder dysfunction or inner leg numbness.  Has taken Tylenol without any improvement.  Does have a history of tachycardia and hypertension.  The history is provided by the patient and medical records.  Back Pain Associated symptoms: leg pain   Leg Pain   Past Medical History:  Diagnosis Date   Diabetes mellitus    H/O acute post-streptococcal glomerulonephritis 1997   History of renal transplant    Hyperlipidemia    Hypertension    Onychomycosis of toenail    Renal disorder    Tachycardia     Patient Active Problem List   Diagnosis Date Noted   Nausea & vomiting 08/04/2023   Anemia 07/31/2023   Erectile dysfunction 04/18/2022   Healthcare maintenance 04/18/2022   Low back pain without sciatica 12/08/2021   Type 2 diabetes mellitus with hyperglycemia (HCC) 02/09/2021   ESRD (end stage renal disease) on dialysis (HCC) 02/09/2021   Uncontrolled type 2 diabetes mellitus without complication, with long-term current use of insulin 10/29/2018   Essential hypertension, benign 10/30/2012   HLD (hyperlipidemia) 08/08/2012   Onychomycosis of toenail 07/04/2012   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/2012   History of renal transplant 05/22/2012    Past Surgical History:  Procedure  Laterality Date   ANKLE SURGERY     AV FISTULA PLACEMENT  1997   Removed after transplant in 2006   AV FISTULA PLACEMENT Right 09/07/2023   Procedure: RIGHT BRACHIOCEPHALIC FISTULA;  Surgeon: Victorino Sparrow, MD;  Location: [facility] OR;  Service: Vascular;  Laterality: Right;  Block in the Right arm   INSERTION OF DIALYSIS CATHETER Right 08/06/2023   Procedure: INSERTION OF TUNNELED DIALYSIS CATHETER, RIGHT INTERNAL JUGULAR;  Surgeon: Victorino Sparrow, MD;  Location: [facility] OR;  Service: Vascular;  Laterality: Right;   IR FLUORO GUIDE CV LINE RIGHT  08/01/2023   IR US GUIDE VASC ACCESS RIGHT  08/01/2023   KIDNEY SURGERY  2006   KIDNEY TRANSPLANT         Home Medications    Prior to Admission medications   Medication Sig Start Date End Date Taking? Authorizing Provider  amLODipine (NORVASC) 10 MG tablet Take 10 mg by mouth daily.    [provider]  Blood Glucose Monitoring Suppl (BLOOD GLUCOSE MONITOR SYSTEM) w/Device KIT Use in the morning, at noon, and at bedtime. 08/07/23   Elberta Fortis, MD  calcitRIOL (ROCALTROL) 0.25 MCG capsule Take 1 capsule (0.25 mcg total) by mouth daily. 08/07/23   Vonna Drafts, MD  Continuous Glucose Sensor (FREESTYLE LIBRE 3 PLUS SENSOR) MISC Change sensor every 15 days. 09/17/23   McDiarmid, Leighton Roach, MD  Darbepoetin Alfa (ARANESP) 100 MCG/0.5ML SOSY injection Inject 0.5 mLs (100 mcg  total) into the skin every Wednesday at 6 PM. Patient not taking: Reported on 08/09/2023 08/08/23   Elberta Fortis, MD  furosemide (LASIX) 40 MG tablet Take 40 mg by mouth daily. 09/11/23   [provider]  HYDROcodone-acetaminophen (NORCO/VICODIN) 5-325 MG tablet Take 1 tablet by mouth every 4 (four) hours as needed for moderate pain (pain score 4-6). Patient not taking: Reported on 09/17/2023 08/07/23   Elberta Fortis, MD  insulin glargine-yfgn [facility]) 100 UNIT/ML Pen Inject 6 Units into the skin daily. Expires 28 days after first use 09/17/23   McDiarmid, Leighton Roach,  MD  Insulin Pen Needle 32G X 4 MM MISC Use with Semglee pen 08/07/23     Methoxy PEG-Epoetin Beta (MIRCERA IJ) Inject 150 Doses as directed daily as needed. 09/06/23 09/04/24  [provider]  multivitamin (RENA-VIT) TABS tablet Take 1 tablet by mouth daily. 08/06/23   Vonna Drafts, MD  mycophenolate (CELLCEPT) 250 MG capsule Take 1 capsule (250 mg total) by mouth 2 (two) times daily. 08/06/23   Vonna Drafts, MD  oxyCODONE-acetaminophen (PERCOCET) 5-325 MG tablet Take 1 tablet by mouth every 4 (four) hours as needed for severe pain (pain score 7-10). Patient not taking: Reported on 09/17/2023 09/07/23 09/06/24  Graceann Congress, PA-C  rosuvastatin (CRESTOR) 10 MG tablet Take one tablet every Monday, Wednesday, and Friday. Patient taking differently: Take 5 mg by mouth every Monday, Wednesday, and Friday. 01/16/23   Sabino Dick, DO  sevelamer carbonate (RENVELA) 800 MG tablet Take 1 tablet (800 mg total) by mouth 3 (three) times daily with meals. 08/07/23   Elberta Fortis, MD  tacrolimus (PROGRAF) 0.5 MG capsule Take 1 capsule (0.5 mg total) by mouth 2 (two) times daily. 08/06/23   Vonna Drafts, MD  atorvastatin (LIPITOR) 40 MG tablet Take 1 tablet (40 mg total) by mouth daily. Patient not taking: Reported on 07/09/2017 07/13/16 05/09/19  Palma Holter, MD    Family History Family History  Problem Relation Age of Onset   Diabetes Brother    Diabetes Maternal Grandmother    Hypertension Maternal Grandmother     Social History Social History   Tobacco Use   Smoking status: Never   Smokeless tobacco: Never  Vaping Use   Vaping status: Never Used  Substance Use Topics   Alcohol use: No   Drug use: No     Allergies   Atorvastatin   Review of Systems Review of Systems  Per HPI   Physical Exam Triage Vital Signs ED Triage Vitals  Encounter Vitals Group     BP 09/21/23 1131 (!) 188/117     Systolic BP Percentile --      Diastolic BP Percentile --       Pulse Rate 09/21/23 1131 (!) 111     Resp 09/21/23 1131 16     Temp 09/21/23 1131 98.4 F (36.9 C)     Temp Source 09/21/23 1131 Oral     SpO2 09/21/23 1131 97 %     Weight --      Height --      Head Circumference --      Peak Flow --      Pain Score 09/21/23 1130 10     Pain Loc --      Pain Education --      Exclude from Growth Chart --    No data found.  Updated Vital Signs BP (!) 188/117 (BP Location: Left Arm)   Pulse (!) 111   Temp 98.4 F (  36.9 C) (Oral)   Resp 16   SpO2 97%   Visual Acuity Right Eye Distance:   Left Eye Distance:   Bilateral Distance:    Right Eye Near:   Left Eye Near:    Bilateral Near:     Physical Exam Vitals and nursing note reviewed.  Constitutional:      Appearance: Normal appearance.  HENT:     Head: Normocephalic and atraumatic.     Right Ear: External ear normal.     Left Ear: External ear normal.     Nose: Nose normal.     Mouth/Throat:     Mouth: Mucous membranes are moist.  Cardiovascular:     Rate and Rhythm: Tachycardia present.  Musculoskeletal:        General: No swelling, tenderness, deformity or signs of injury. Normal range of motion.     Lumbar back: No tenderness. Normal range of motion.       Back:     Comments: Bilateral lower lumbar back pain.  No tenderness to palpation.  Spine without step-off or deformity.  Atraumatic.  Skin:    General: Skin is warm and dry.  Neurological:     General: No focal deficit present.     Mental Status: He is alert and oriented to person, place, and time.  Psychiatric:        Mood and Affect: Mood normal.        Behavior: Behavior normal. Behavior is cooperative.      UC Treatments / Results  Labs (all labs ordered are listed, but only abnormal results are displayed) Labs Reviewed - No data to display  EKG   Radiology No results found.  Procedures Procedures (including critical care time)  Medications Ordered in UC Medications  dexamethasone (DECADRON)  injection 10 mg (10 mg Intramuscular Given 09/21/23 1155)    Initial Impression / Assessment and Plan / UC Course  I have reviewed the triage vital signs and the nursing notes.  Pertinent labs & imaging results that were available during my care of the patient were reviewed by me and considered in my medical decision making (see chart for details).  Vitals and triage reviewed, patient is hemodynamically stable.  He is hypertensive with tachycardia, history of same.  Atraumatic back pain without any spinal tenderness, imaging deferred.  No inner leg numbness, bowel or bladder dysfunction, low concern for cauda equina.  Will trial steroid injection in clinic and symptomatic management, strict emergency precautions given if no improvement or symptoms evolve.  Plan of care, follow-up care return precautions given, no questions at this time.     Final Clinical Impressions(s) / UC Diagnoses   Final diagnoses:  Acute bilateral low back pain, unspecified whether sciatica present     Discharge Instructions      We have given you a steroid injection in clinic to help with pain and inflammation.  You can continue to take Tylenol as well.  Heat and gentle stretching may help as well.  I believe the pain from your back is radiating into your leg from nerve compression.  If the steroid shot does not help within the next few hours or if you develop any new concerning symptoms or worsening pain, please head to the nearest emergency department for further advanced evaluation.      ED Prescriptions   None    PDMP not reviewed this encounter.   Pasty Manninen, [place], [place] 09/21/23 1209

## 2023-09-22 DIAGNOSIS — L299 Pruritus, unspecified: Secondary | ICD-10-CM | POA: Diagnosis not present

## 2023-09-22 DIAGNOSIS — N186 End stage renal disease: Secondary | ICD-10-CM | POA: Diagnosis not present

## 2023-09-22 DIAGNOSIS — R52 Pain, unspecified: Secondary | ICD-10-CM | POA: Diagnosis not present

## 2023-09-22 DIAGNOSIS — Z992 Dependence on renal dialysis: Secondary | ICD-10-CM | POA: Diagnosis not present

## 2023-09-22 DIAGNOSIS — T8249XA Other complication of vascular dialysis catheter, initial encounter: Secondary | ICD-10-CM | POA: Diagnosis not present

## 2023-09-23 ENCOUNTER — Emergency Department (HOSPITAL_COMMUNITY)
Admission: EM | Admit: 2023-09-23 | Discharge: 2023-09-23 | Disposition: A | Discharge status: Home / Self Care | Payer: BC Managed Care – PPO | Attending: Emergency Medicine | Admitting: Emergency Medicine

## 2023-09-23 ENCOUNTER — Encounter (HOSPITAL_COMMUNITY): Payer: Self-pay

## 2023-09-23 DIAGNOSIS — I120 Hypertensive chronic kidney disease with stage 5 chronic kidney disease or end stage renal disease: Secondary | ICD-10-CM | POA: Insufficient documentation

## 2023-09-23 DIAGNOSIS — E1122 Type 2 diabetes mellitus with diabetic chronic kidney disease: Secondary | ICD-10-CM | POA: Diagnosis not present

## 2023-09-23 DIAGNOSIS — M545 Low back pain, unspecified: Secondary | ICD-10-CM | POA: Insufficient documentation

## 2023-09-23 DIAGNOSIS — Z79899 Other long term (current) drug therapy: Secondary | ICD-10-CM | POA: Insufficient documentation

## 2023-09-23 DIAGNOSIS — I10 Essential (primary) hypertension: Secondary | ICD-10-CM | POA: Diagnosis not present

## 2023-09-23 DIAGNOSIS — Z794 Long term (current) use of insulin: Secondary | ICD-10-CM | POA: Diagnosis not present

## 2023-09-23 DIAGNOSIS — N186 End stage renal disease: Secondary | ICD-10-CM | POA: Diagnosis not present

## 2023-09-23 DIAGNOSIS — M549 Dorsalgia, unspecified: Secondary | ICD-10-CM

## 2023-09-23 MED ORDER — HYDROCODONE-ACETAMINOPHEN 5-325 MG PO TABS: 1.0000 | ORAL_TABLET | Freq: Four times a day (QID) | ORAL | 0 refills | Status: DC | PRN

## 2023-09-23 MED ORDER — HYDROCODONE-ACETAMINOPHEN 5-325 MG PO TABS: 2.0000 | ORAL_TABLET | ORAL | 0 refills | Status: DC | PRN

## 2023-09-23 MED ORDER — OXYCODONE-ACETAMINOPHEN 5-325 MG PO TABS
1.0000 | ORAL_TABLET | Freq: Once | ORAL | Status: AC
Start: 2023-09-23 — End: 2023-09-23
  Administered 2023-09-23: 1 via ORAL
  Filled 2023-09-23: qty 1

## 2023-09-23 MED ORDER — OXYCODONE HCL 5 MG PO TABS
5.0000 mg | ORAL_TABLET | Freq: Once | ORAL | Status: AC
  Administered 2023-09-23: 5 mg via ORAL
  Filled 2023-09-23: qty 1

## 2023-09-23 MED ORDER — LIDOCAINE 5 % EX PTCH
1.0000 | MEDICATED_PATCH | CUTANEOUS | Status: DC
  Administered 2023-09-23: 1 via TRANSDERMAL
  Filled 2023-09-23: qty 1

## 2023-09-23 NOTE — Discharge Instructions (Signed)
Evaluation for your back pain was overall reassuring.  I sent a few tablets of Norco to your pharmacy for pain.  Do recommend you follow-up with your PCP.  Your blood pressure was also noted to be elevated during this encounter.  Please continue taking your blood pressure medications as prescribed and follow-up with your doctor as well as you may need an adjustment of your medications.  If you develop headache, visual disturbance, chest pain or shortness of breath or abdominal pain or any other concerning symptom please return to the emergency department further evaluation.

## 2023-09-23 NOTE — ED Provider Triage Note (Signed)
Emergency Medicine Provider Triage Evaluation Note  THEADOR JEZEWSKI , a 48 y.o. male  was evaluated in triage.  Pt complains of low back pain x 3 days. Pain is constant. Currently with radiation to the lateral LLE. Went to urgent care and got a steroid injection, but this has provided no relief. Denies fever, urinary symptoms, genital or perianal numbness, bowel/bladder incontinence. Denies IVDU. Is on HD, last dialyzed yesterday (09/22/23). No falls, trauma, or injury inciting symptoms.  Review of Systems  Positive: As above Negative: As above  Physical Exam  BP (!) 225/120   Pulse (!) 115   Temp 98.8 F (37.1 C)   Resp 16   SpO2 99%  Gen:   Awake, no distress   Resp:  Normal effort  MSK:   Moves extremities without difficulty  Other:  Pacing the room due to discomfort; gait is steady.  Medical Decision Making  Medically screening exam initiated at 4:09 AM.  Appropriate orders placed.  KOLBEE BOGUSZ was informed that the remainder of the evaluation will be completed by another provider, this initial triage assessment does not replace that evaluation, and the importance of remaining in the ED until their evaluation is complete.  Low back pain - appears to have some radicular component. Atraumatic without red flags for cauda equina. Medications ordered for pain pending room placement.   Antony Madura, PA-C 09/23/23 [phone]

## 2023-09-23 NOTE — ED Provider Notes (Signed)
[facility] AT [facility] Provider Note   CSN: [id number] Arrival date & time: 09/23/23  4098     History  Chief Complaint  Patient presents with   Back Pain   Sciatica   HPI Adam Davis is a 48 y.o. male with history of ESRD, hypertension, type 2 diabetes presenting for back pain.  Started 2 days ago.  Located in the left lower back.  The pain is sharp at times radiates down the left leg.  Denies pain about the midline.  Denies urinary or bowel changes, saddle anesthesia, IV drug use.  Blood pressure noted to be elevated.  Patient does report that he is taking his blood pressure medications as prescribed.  Denies headache, visual disturbance, abdominal pain, chest pain and shortness of breath.   Back Pain      Home Medications Prior to Admission medications   Medication Sig Start Date End Date Taking? Authorizing Provider  HYDROcodone-acetaminophen (NORCO/VICODIN) 5-325 MG tablet Take 2 tablets by mouth every 4 (four) hours as needed. 09/23/23  Yes Gareth Eagle, PA-C  amLODipine (NORVASC) 10 MG tablet Take 10 mg by mouth daily.    [provider]  Blood Glucose Monitoring Suppl (BLOOD GLUCOSE MONITOR SYSTEM) w/Device KIT Use in the morning, at noon, and at bedtime. 08/07/23   Elberta Fortis, MD  calcitRIOL (ROCALTROL) 0.25 MCG capsule Take 1 capsule (0.25 mcg total) by mouth daily. 08/07/23   Vonna Drafts, MD  Continuous Glucose Sensor (FREESTYLE LIBRE 3 PLUS SENSOR) MISC Change sensor every 15 days. 09/17/23   McDiarmid, Leighton Roach, MD  Darbepoetin Alfa (ARANESP) 100 MCG/0.5ML SOSY injection Inject 0.5 mLs (100 mcg total) into the skin every Wednesday at 6 PM. Patient not taking: Reported on 08/09/2023 08/08/23   Elberta Fortis, MD  furosemide (LASIX) 40 MG tablet Take 40 mg by mouth daily. 09/11/23   [provider]  insulin glargine-yfgn (SEMGLEE) 100 UNIT/ML Pen Inject 6 Units into the skin daily. Expires 28 days after  first use 09/17/23   McDiarmid, Leighton Roach, MD  Insulin Pen Needle 32G X 4 MM MISC Use with Semglee pen 08/07/23     Methoxy PEG-Epoetin Beta (MIRCERA IJ) Inject 150 Doses as directed daily as needed. 09/06/23 09/04/24  [provider]  multivitamin (RENA-VIT) TABS tablet Take 1 tablet by mouth daily. 08/06/23   Vonna Drafts, MD  mycophenolate (CELLCEPT) 250 MG capsule Take 1 capsule (250 mg total) by mouth 2 (two) times daily. 08/06/23   Vonna Drafts, MD  oxyCODONE-acetaminophen (PERCOCET) 5-325 MG tablet Take 1 tablet by mouth every 4 (four) hours as needed for severe pain (pain score 7-10). Patient not taking: Reported on 09/17/2023 09/07/23 09/06/24  Graceann Congress, PA-C  rosuvastatin (CRESTOR) 10 MG tablet Take one tablet every Monday, Wednesday, and Friday. Patient taking differently: Take 5 mg by mouth every Monday, Wednesday, and Friday. 01/16/23   Sabino Dick, DO  sevelamer carbonate (RENVELA) 800 MG tablet Take 1 tablet (800 mg total) by mouth 3 (three) times daily with meals. 08/07/23   Elberta Fortis, MD  tacrolimus (PROGRAF) 0.5 MG capsule Take 1 capsule (0.5 mg total) by mouth 2 (two) times daily. 08/06/23   Vonna Drafts, MD  atorvastatin (LIPITOR) 40 MG tablet Take 1 tablet (40 mg total) by mouth daily. Patient not taking: Reported on 07/09/2017 07/13/16 05/09/19  Palma Holter, MD      Allergies    Atorvastatin    Review of Systems   Review of  Systems  Musculoskeletal:  Positive for back pain.    Physical Exam Updated Vital Signs BP (!) 184/103   Pulse (!) 102   Temp 98.8 F (37.1 C)   Resp 14   SpO2 99%  Physical Exam Constitutional:      Appearance: Normal appearance.  HENT:     Head: Normocephalic.     Nose: Nose normal.  Eyes:     Conjunctiva/sclera: Conjunctivae normal.  Pulmonary:     Effort: Pulmonary effort is normal.  Musculoskeletal:     Cervical back: Normal.     Thoracic back: Normal.     Lumbar back: Normal. No swelling, edema,  signs of trauma, tenderness or bony tenderness. Normal range of motion.  Neurological:     Mental Status: He is alert.  Psychiatric:        Mood and Affect: Mood normal.     ED Results / Procedures / Treatments   Labs (all labs ordered are listed, but only abnormal results are displayed) Labs Reviewed - No data to display  EKG None  Radiology No results found.  Procedures Procedures    Medications Ordered in ED Medications  lidocaine (LIDODERM) 5 % 1 patch (1 patch Transdermal Patch Applied 09/23/23 0425)  oxyCODONE (Oxy IR/ROXICODONE) immediate release tablet 5 mg (5 mg Oral Given 09/23/23 0425)  oxyCODONE-acetaminophen (PERCOCET/ROXICET) 5-325 MG per tablet 1 tablet (1 tablet Oral Given 09/23/23 1016)    ED Course/ Medical Decision Making/ A&P                                 Medical Decision Making Risk Prescription drug management.   48 year old well-appearing male presenting for back pain.  Exam was unremarkable.  Suspect MSK or sciatic pain given no red flag symptoms and no urinary symptoms.  Workup was largely reassuring but he was notably hypertensive during the encounter.  Treated with Percocet and blood pressure did improve.  He denied any symptoms suggestive of hypertensive emergency.  Advised him to continue taking his antihypertensives as prescribed and to follow-up with his PCP.  Discussed return precautions.  Sent a few tablets of Percocet to his pharmacy for his back pain was advised to follow-up PCP.  Discharged in good condition.        Final Clinical Impression(s) / ED Diagnoses Final diagnoses:  Back pain, unspecified back location, unspecified back pain laterality, unspecified chronicity    Rx / DC Orders ED Discharge Orders          Ordered    HYDROcodone-acetaminophen (NORCO/VICODIN) 5-325 MG tablet  Every 4 hours PRN        09/23/23 1129              Gareth Eagle, PA-C 09/23/23 1129    Gwyneth Sprout, MD 09/24/23  1500

## 2023-09-23 NOTE — ED Triage Notes (Signed)
Pt comes from home via [facility] for sciatica on the L side for the past two days

## 2023-09-24 ENCOUNTER — Ambulatory Visit: Payer: BC Managed Care – PPO | Admitting: Pharmacist

## 2023-09-25 DIAGNOSIS — T8249XA Other complication of vascular dialysis catheter, initial encounter: Secondary | ICD-10-CM | POA: Diagnosis not present

## 2023-09-25 DIAGNOSIS — Z992 Dependence on renal dialysis: Secondary | ICD-10-CM | POA: Diagnosis not present

## 2023-09-25 DIAGNOSIS — N186 End stage renal disease: Secondary | ICD-10-CM | POA: Diagnosis not present

## 2023-09-27 ENCOUNTER — Other Ambulatory Visit: Payer: Self-pay | Admitting: *Deleted

## 2023-09-27 DIAGNOSIS — T8249XA Other complication of vascular dialysis catheter, initial encounter: Secondary | ICD-10-CM | POA: Diagnosis not present

## 2023-09-27 DIAGNOSIS — N186 End stage renal disease: Secondary | ICD-10-CM

## 2023-09-27 DIAGNOSIS — Z992 Dependence on renal dialysis: Secondary | ICD-10-CM | POA: Diagnosis not present

## 2023-09-28 ENCOUNTER — Ambulatory Visit (INDEPENDENT_AMBULATORY_CARE_PROVIDER_SITE_OTHER): Payer: BC Managed Care – PPO | Admitting: Pharmacist

## 2023-09-28 DIAGNOSIS — T8612 Kidney transplant failure: Secondary | ICD-10-CM | POA: Diagnosis not present

## 2023-09-28 DIAGNOSIS — E139 Other specified diabetes mellitus without complications: Secondary | ICD-10-CM

## 2023-09-28 DIAGNOSIS — Z992 Dependence on renal dialysis: Secondary | ICD-10-CM | POA: Diagnosis not present

## 2023-09-28 DIAGNOSIS — N186 End stage renal disease: Secondary | ICD-10-CM | POA: Diagnosis not present

## 2023-09-28 DIAGNOSIS — Z940 Kidney transplant status: Secondary | ICD-10-CM

## 2023-09-28 NOTE — Progress Notes (Signed)
    S:     Chief Complaint  Patient presents with   Medication Management    CGM set-up appointment - NO READER    49 y.o. male who presents for diabetes evaluation, education, and initiation of CGM Libre 3 READER and sensors.  Patient arrives in good spirits and presents without  any assistance however, patient did not have Libre 3 sensors or reader.  Patient admitted he did not go to pharmacy to pick-up prior to today's appointment.  I asked him to wait while I called the pharmacy.   I determined that [place] 3 reader and sensors were approved for pick-up and covered by his insurance.  When I contacted his pharmacy they shared that the  [place] 3 reader  -  is $65 (one time cost) Libre 3 sensors - $75 for two sensors (month supply) These costs are likely full cost as patient is paying deductible.    Discussed deductible timing and payoff date strategy to minimize cost. I asked patient to call his [employer] and determine the remaining portion of your drug benefit deductible to be paid.   He understood instruction and will consider paying for reader only (return to clinic for set-up with use of a sample sensor initially).   Vs waiting till deductible met and purchasing both reader and sensors for lower cost.   Asked patient to decide on strategy and approach then schedule/return to office for initiation of CGM.  No charge for visit today.     Written patient instructions provided. Patient verbalized understanding of treatment plan.  Total time in face to face counseling 13 minutes.    Follow-up:  Pharmacist as soon as reader available  - No charge for visit today as reason for visit was not available for set-up and rescheduled.

## 2023-09-28 NOTE — Patient Instructions (Signed)
It was nice to see you today!  Unfortunately, you are currently in the deductible portion of your payment year.   Your pharmacy shared that the  [place] 3 reader  -  is $65 (one time cost) Libre 3 sensors - $75 for two sensors (month supply)  Please call your [employer] and determine the remaining portion of your drug benefit deductible.   Once we determine the time till deductible is covered, the cost is likely to be lower aftetward.    Thanks for coming in today.

## 2023-09-28 NOTE — Assessment & Plan Note (Signed)
Patient did not have Libre 3 sensors or reader at start of visit.  Following discussion - visit reschedule plan created.

## 2023-09-29 DIAGNOSIS — T8249XA Other complication of vascular dialysis catheter, initial encounter: Secondary | ICD-10-CM | POA: Diagnosis not present

## 2023-09-29 DIAGNOSIS — Z992 Dependence on renal dialysis: Secondary | ICD-10-CM | POA: Diagnosis not present

## 2023-09-29 DIAGNOSIS — N186 End stage renal disease: Secondary | ICD-10-CM | POA: Diagnosis not present

## 2023-10-01 NOTE — Progress Notes (Signed)
 Reviewed and agree with Dr Macky Lower plan.

## 2023-10-02 DIAGNOSIS — Z992 Dependence on renal dialysis: Secondary | ICD-10-CM | POA: Diagnosis not present

## 2023-10-02 DIAGNOSIS — N186 End stage renal disease: Secondary | ICD-10-CM | POA: Diagnosis not present

## 2023-10-02 DIAGNOSIS — T8249XA Other complication of vascular dialysis catheter, initial encounter: Secondary | ICD-10-CM | POA: Diagnosis not present

## 2023-10-04 DIAGNOSIS — Z992 Dependence on renal dialysis: Secondary | ICD-10-CM | POA: Diagnosis not present

## 2023-10-04 DIAGNOSIS — T8249XA Other complication of vascular dialysis catheter, initial encounter: Secondary | ICD-10-CM | POA: Diagnosis not present

## 2023-10-04 DIAGNOSIS — N186 End stage renal disease: Secondary | ICD-10-CM | POA: Diagnosis not present

## 2023-10-06 DIAGNOSIS — T8249XA Other complication of vascular dialysis catheter, initial encounter: Secondary | ICD-10-CM | POA: Diagnosis not present

## 2023-10-06 DIAGNOSIS — Z992 Dependence on renal dialysis: Secondary | ICD-10-CM | POA: Diagnosis not present

## 2023-10-06 DIAGNOSIS — N186 End stage renal disease: Secondary | ICD-10-CM | POA: Diagnosis not present

## 2023-10-09 DIAGNOSIS — T8249XA Other complication of vascular dialysis catheter, initial encounter: Secondary | ICD-10-CM | POA: Diagnosis not present

## 2023-10-09 DIAGNOSIS — R52 Pain, unspecified: Secondary | ICD-10-CM | POA: Diagnosis not present

## 2023-10-09 DIAGNOSIS — N186 End stage renal disease: Secondary | ICD-10-CM | POA: Diagnosis not present

## 2023-10-09 DIAGNOSIS — Z992 Dependence on renal dialysis: Secondary | ICD-10-CM | POA: Diagnosis not present

## 2023-10-11 ENCOUNTER — Ambulatory Visit (HOSPITAL_COMMUNITY): Payer: BC Managed Care – PPO

## 2023-10-11 DIAGNOSIS — Z992 Dependence on renal dialysis: Secondary | ICD-10-CM | POA: Diagnosis not present

## 2023-10-11 DIAGNOSIS — T8249XA Other complication of vascular dialysis catheter, initial encounter: Secondary | ICD-10-CM | POA: Diagnosis not present

## 2023-10-11 DIAGNOSIS — N186 End stage renal disease: Secondary | ICD-10-CM | POA: Diagnosis not present

## 2023-10-11 DIAGNOSIS — R52 Pain, unspecified: Secondary | ICD-10-CM | POA: Diagnosis not present

## 2023-10-13 DIAGNOSIS — T8249XA Other complication of vascular dialysis catheter, initial encounter: Secondary | ICD-10-CM | POA: Diagnosis not present

## 2023-10-13 DIAGNOSIS — N186 End stage renal disease: Secondary | ICD-10-CM | POA: Diagnosis not present

## 2023-10-13 DIAGNOSIS — Z992 Dependence on renal dialysis: Secondary | ICD-10-CM | POA: Diagnosis not present

## 2023-10-13 DIAGNOSIS — R52 Pain, unspecified: Secondary | ICD-10-CM | POA: Diagnosis not present

## 2023-10-16 DIAGNOSIS — Z992 Dependence on renal dialysis: Secondary | ICD-10-CM | POA: Diagnosis not present

## 2023-10-16 DIAGNOSIS — T8249XA Other complication of vascular dialysis catheter, initial encounter: Secondary | ICD-10-CM | POA: Diagnosis not present

## 2023-10-16 DIAGNOSIS — N186 End stage renal disease: Secondary | ICD-10-CM | POA: Diagnosis not present

## 2023-10-18 DIAGNOSIS — N186 End stage renal disease: Secondary | ICD-10-CM | POA: Diagnosis not present

## 2023-10-18 DIAGNOSIS — T8249XA Other complication of vascular dialysis catheter, initial encounter: Secondary | ICD-10-CM | POA: Diagnosis not present

## 2023-10-18 DIAGNOSIS — Z992 Dependence on renal dialysis: Secondary | ICD-10-CM | POA: Diagnosis not present

## 2023-10-20 DIAGNOSIS — Z992 Dependence on renal dialysis: Secondary | ICD-10-CM | POA: Diagnosis not present

## 2023-10-20 DIAGNOSIS — N186 End stage renal disease: Secondary | ICD-10-CM | POA: Diagnosis not present

## 2023-10-20 DIAGNOSIS — T8249XA Other complication of vascular dialysis catheter, initial encounter: Secondary | ICD-10-CM | POA: Diagnosis not present

## 2023-10-23 DIAGNOSIS — N186 End stage renal disease: Secondary | ICD-10-CM | POA: Diagnosis not present

## 2023-10-23 DIAGNOSIS — T8249XA Other complication of vascular dialysis catheter, initial encounter: Secondary | ICD-10-CM | POA: Diagnosis not present

## 2023-10-23 DIAGNOSIS — Z992 Dependence on renal dialysis: Secondary | ICD-10-CM | POA: Diagnosis not present

## 2023-10-25 DIAGNOSIS — N186 End stage renal disease: Secondary | ICD-10-CM | POA: Diagnosis not present

## 2023-10-25 DIAGNOSIS — T8249XA Other complication of vascular dialysis catheter, initial encounter: Secondary | ICD-10-CM | POA: Diagnosis not present

## 2023-10-25 DIAGNOSIS — Z992 Dependence on renal dialysis: Secondary | ICD-10-CM | POA: Diagnosis not present

## 2023-10-26 DIAGNOSIS — T8612 Kidney transplant failure: Secondary | ICD-10-CM | POA: Diagnosis not present

## 2023-10-26 DIAGNOSIS — N186 End stage renal disease: Secondary | ICD-10-CM | POA: Diagnosis not present

## 2023-10-26 DIAGNOSIS — Z992 Dependence on renal dialysis: Secondary | ICD-10-CM | POA: Diagnosis not present

## 2023-10-27 DIAGNOSIS — N186 End stage renal disease: Secondary | ICD-10-CM | POA: Diagnosis not present

## 2023-10-27 DIAGNOSIS — T8249XA Other complication of vascular dialysis catheter, initial encounter: Secondary | ICD-10-CM | POA: Diagnosis not present

## 2023-10-27 DIAGNOSIS — Z992 Dependence on renal dialysis: Secondary | ICD-10-CM | POA: Diagnosis not present

## 2023-10-30 DIAGNOSIS — T8249XA Other complication of vascular dialysis catheter, initial encounter: Secondary | ICD-10-CM | POA: Diagnosis not present

## 2023-10-30 DIAGNOSIS — Z992 Dependence on renal dialysis: Secondary | ICD-10-CM | POA: Diagnosis not present

## 2023-10-30 DIAGNOSIS — N186 End stage renal disease: Secondary | ICD-10-CM | POA: Diagnosis not present

## 2023-10-30 DIAGNOSIS — R52 Pain, unspecified: Secondary | ICD-10-CM | POA: Diagnosis not present

## 2023-11-01 DIAGNOSIS — Z992 Dependence on renal dialysis: Secondary | ICD-10-CM | POA: Diagnosis not present

## 2023-11-01 DIAGNOSIS — N186 End stage renal disease: Secondary | ICD-10-CM | POA: Diagnosis not present

## 2023-11-01 DIAGNOSIS — R52 Pain, unspecified: Secondary | ICD-10-CM | POA: Diagnosis not present

## 2023-11-01 DIAGNOSIS — T8249XA Other complication of vascular dialysis catheter, initial encounter: Secondary | ICD-10-CM | POA: Diagnosis not present

## 2023-11-02 DIAGNOSIS — Z940 Kidney transplant status: Secondary | ICD-10-CM | POA: Diagnosis not present

## 2023-11-02 DIAGNOSIS — N051 Unspecified nephritic syndrome with focal and segmental glomerular lesions: Secondary | ICD-10-CM | POA: Diagnosis not present

## 2023-11-02 DIAGNOSIS — Z01818 Encounter for other preprocedural examination: Secondary | ICD-10-CM | POA: Diagnosis not present

## 2023-11-02 DIAGNOSIS — Z114 Encounter for screening for human immunodeficiency virus [HIV]: Secondary | ICD-10-CM | POA: Diagnosis not present

## 2023-11-02 DIAGNOSIS — Z794 Long term (current) use of insulin: Secondary | ICD-10-CM | POA: Diagnosis not present

## 2023-11-02 DIAGNOSIS — Z7682 Awaiting organ transplant status: Secondary | ICD-10-CM | POA: Diagnosis not present

## 2023-11-02 DIAGNOSIS — E1121 Type 2 diabetes mellitus with diabetic nephropathy: Secondary | ICD-10-CM | POA: Diagnosis not present

## 2023-11-02 DIAGNOSIS — Z1159 Encounter for screening for other viral diseases: Secondary | ICD-10-CM | POA: Diagnosis not present

## 2023-11-02 DIAGNOSIS — Z992 Dependence on renal dialysis: Secondary | ICD-10-CM | POA: Diagnosis not present

## 2023-11-02 DIAGNOSIS — T8612 Kidney transplant failure: Secondary | ICD-10-CM | POA: Diagnosis not present

## 2023-11-02 DIAGNOSIS — I120 Hypertensive chronic kidney disease with stage 5 chronic kidney disease or end stage renal disease: Secondary | ICD-10-CM | POA: Diagnosis not present

## 2023-11-02 DIAGNOSIS — N186 End stage renal disease: Secondary | ICD-10-CM | POA: Diagnosis not present

## 2023-11-03 DIAGNOSIS — T8249XA Other complication of vascular dialysis catheter, initial encounter: Secondary | ICD-10-CM | POA: Diagnosis not present

## 2023-11-03 DIAGNOSIS — Z992 Dependence on renal dialysis: Secondary | ICD-10-CM | POA: Diagnosis not present

## 2023-11-03 DIAGNOSIS — N186 End stage renal disease: Secondary | ICD-10-CM | POA: Diagnosis not present

## 2023-11-03 DIAGNOSIS — R52 Pain, unspecified: Secondary | ICD-10-CM | POA: Diagnosis not present

## 2023-11-06 DIAGNOSIS — T8249XA Other complication of vascular dialysis catheter, initial encounter: Secondary | ICD-10-CM | POA: Diagnosis not present

## 2023-11-06 DIAGNOSIS — R52 Pain, unspecified: Secondary | ICD-10-CM | POA: Diagnosis not present

## 2023-11-06 DIAGNOSIS — N186 End stage renal disease: Secondary | ICD-10-CM | POA: Diagnosis not present

## 2023-11-06 DIAGNOSIS — Z992 Dependence on renal dialysis: Secondary | ICD-10-CM | POA: Diagnosis not present

## 2023-11-07 DIAGNOSIS — M545 Low back pain, unspecified: Secondary | ICD-10-CM | POA: Diagnosis not present

## 2023-11-08 DIAGNOSIS — Z992 Dependence on renal dialysis: Secondary | ICD-10-CM | POA: Diagnosis not present

## 2023-11-08 DIAGNOSIS — N186 End stage renal disease: Secondary | ICD-10-CM | POA: Diagnosis not present

## 2023-11-08 DIAGNOSIS — T8249XA Other complication of vascular dialysis catheter, initial encounter: Secondary | ICD-10-CM | POA: Diagnosis not present

## 2023-11-08 DIAGNOSIS — R52 Pain, unspecified: Secondary | ICD-10-CM | POA: Diagnosis not present

## 2023-11-10 DIAGNOSIS — T8249XA Other complication of vascular dialysis catheter, initial encounter: Secondary | ICD-10-CM | POA: Diagnosis not present

## 2023-11-10 DIAGNOSIS — R52 Pain, unspecified: Secondary | ICD-10-CM | POA: Diagnosis not present

## 2023-11-10 DIAGNOSIS — N186 End stage renal disease: Secondary | ICD-10-CM | POA: Diagnosis not present

## 2023-11-10 DIAGNOSIS — Z992 Dependence on renal dialysis: Secondary | ICD-10-CM | POA: Diagnosis not present

## 2023-11-13 DIAGNOSIS — T8249XA Other complication of vascular dialysis catheter, initial encounter: Secondary | ICD-10-CM | POA: Diagnosis not present

## 2023-11-13 DIAGNOSIS — N2581 Secondary hyperparathyroidism of renal origin: Secondary | ICD-10-CM | POA: Diagnosis not present

## 2023-11-13 DIAGNOSIS — N186 End stage renal disease: Secondary | ICD-10-CM | POA: Diagnosis not present

## 2023-11-13 DIAGNOSIS — Z992 Dependence on renal dialysis: Secondary | ICD-10-CM | POA: Diagnosis not present

## 2023-11-15 DIAGNOSIS — Z992 Dependence on renal dialysis: Secondary | ICD-10-CM | POA: Diagnosis not present

## 2023-11-15 DIAGNOSIS — N2581 Secondary hyperparathyroidism of renal origin: Secondary | ICD-10-CM | POA: Diagnosis not present

## 2023-11-15 DIAGNOSIS — N186 End stage renal disease: Secondary | ICD-10-CM | POA: Diagnosis not present

## 2023-11-15 DIAGNOSIS — T8249XA Other complication of vascular dialysis catheter, initial encounter: Secondary | ICD-10-CM | POA: Diagnosis not present

## 2023-11-17 DIAGNOSIS — N2581 Secondary hyperparathyroidism of renal origin: Secondary | ICD-10-CM | POA: Diagnosis not present

## 2023-11-17 DIAGNOSIS — Z992 Dependence on renal dialysis: Secondary | ICD-10-CM | POA: Diagnosis not present

## 2023-11-17 DIAGNOSIS — T8249XA Other complication of vascular dialysis catheter, initial encounter: Secondary | ICD-10-CM | POA: Diagnosis not present

## 2023-11-17 DIAGNOSIS — N186 End stage renal disease: Secondary | ICD-10-CM | POA: Diagnosis not present

## 2023-11-20 ENCOUNTER — Telehealth: Payer: Self-pay | Admitting: Pharmacist

## 2023-11-20 DIAGNOSIS — T8249XA Other complication of vascular dialysis catheter, initial encounter: Secondary | ICD-10-CM | POA: Diagnosis not present

## 2023-11-20 DIAGNOSIS — Z992 Dependence on renal dialysis: Secondary | ICD-10-CM | POA: Diagnosis not present

## 2023-11-20 DIAGNOSIS — N186 End stage renal disease: Secondary | ICD-10-CM | POA: Diagnosis not present

## 2023-11-20 DIAGNOSIS — L299 Pruritus, unspecified: Secondary | ICD-10-CM | POA: Diagnosis not present

## 2023-11-20 DIAGNOSIS — N2581 Secondary hyperparathyroidism of renal origin: Secondary | ICD-10-CM | POA: Diagnosis not present

## 2023-11-20 NOTE — Telephone Encounter (Signed)
 Patient contacted for follow-up of CGM. Patient missed appointment on 1/27 and has rescheduled to 3/31.  Since last contact patient reports being able to receive reader and encouraged to bring it to upcoming appointment. Patient has upcoming kidney transplant in which A1C goal is to reach <8. Patient reports same insurance - [employer].  Current DM Medications include: Semglee (insulin glargine) 6 units daily Patient denies any significant medication related side effects.  Medication Plan: -Plan to help patient connect sensors to reader at upcoming appointment.  Total time with patient call and documentation of interaction: 8 minutes.

## 2023-11-22 DIAGNOSIS — L299 Pruritus, unspecified: Secondary | ICD-10-CM | POA: Diagnosis not present

## 2023-11-22 DIAGNOSIS — N2581 Secondary hyperparathyroidism of renal origin: Secondary | ICD-10-CM | POA: Diagnosis not present

## 2023-11-22 DIAGNOSIS — T8249XA Other complication of vascular dialysis catheter, initial encounter: Secondary | ICD-10-CM | POA: Diagnosis not present

## 2023-11-22 DIAGNOSIS — N186 End stage renal disease: Secondary | ICD-10-CM | POA: Diagnosis not present

## 2023-11-22 DIAGNOSIS — Z992 Dependence on renal dialysis: Secondary | ICD-10-CM | POA: Diagnosis not present

## 2023-11-23 NOTE — Progress Notes (Signed)
 POST OPERATIVE OFFICE NOTE    CC:  F/u for surgery  HPI:  This is a 48 y.o. male who is s/p right BC AVF and right radial artery endarterectomy on 09/07/2023 by Dr. Karin Lieu.  He had TDC placed 08/06/2023 also by Dr. Karin Lieu.    Pt states he does not have pain/numbness in the right hand.  He has hx of right RC AVF and transplant in the past.  He is now on HD T/T/S at [facility] location.  He is being evaluated for transplant.     Allergies  Allergen Reactions   Atorvastatin Other (See Comments)    Myalgia - resolved with discontinuation    Current Outpatient Medications  Medication Sig Dispense Refill   amLODipine (NORVASC) 10 MG tablet Take 10 mg by mouth daily.     Blood Glucose Monitoring Suppl (BLOOD GLUCOSE MONITOR SYSTEM) w/Device KIT Use in the morning, at noon, and at bedtime. 1 kit 0   calcitRIOL (ROCALTROL) 0.25 MCG capsule Take 1 capsule (0.25 mcg total) by mouth daily. 30 capsule 0   Continuous Glucose Sensor (FREESTYLE LIBRE 3 PLUS SENSOR) MISC Change sensor every 15 days. 2 each 11   Darbepoetin Alfa (ARANESP) 100 MCG/0.5ML SOSY injection Inject 0.5 mLs (100 mcg total) into the skin every Wednesday at 6 PM. (Patient not taking: Reported on 08/09/2023) 2 mL 0   furosemide (LASIX) 40 MG tablet Take 40 mg by mouth daily.     HYDROcodone-acetaminophen (NORCO/VICODIN) 5-325 MG tablet Take 1 tablet by mouth every 6 (six) hours as needed. 8 tablet 0   insulin glargine-yfgn (SEMGLEE) 100 UNIT/ML Pen Inject 6 Units into the skin daily. Expires 28 days after first use     Insulin Pen Needle 32G X 4 MM MISC Use with Semglee pen 100 each 0   Methoxy PEG-Epoetin Beta (MIRCERA IJ) Inject 150 Doses as directed daily as needed.     multivitamin (RENA-VIT) TABS tablet Take 1 tablet by mouth daily. 30 tablet 0   mycophenolate (CELLCEPT) 250 MG capsule Take 1 capsule (250 mg total) by mouth 2 (two) times daily. 60 capsule 0   oxyCODONE-acetaminophen (PERCOCET) 5-325 MG tablet Take 1 tablet by  mouth every 4 (four) hours as needed for severe pain (pain score 7-10). (Patient not taking: Reported on 09/17/2023) 10 tablet 0   rosuvastatin (CRESTOR) 10 MG tablet Take one tablet every Monday, Wednesday, and Friday. (Patient taking differently: Take 5 mg by mouth every Monday, Wednesday, and Friday.) 30 tablet 1   sevelamer carbonate (RENVELA) 800 MG tablet Take 1 tablet (800 mg total) by mouth 3 (three) times daily with meals. 90 tablet 0   tacrolimus (PROGRAF) 0.5 MG capsule Take 1 capsule (0.5 mg total) by mouth 2 (two) times daily. 60 capsule 0   No current facility-administered medications for this visit.     ROS:  See HPI  Physical Exam:  Today's Vitals   11/26/23 0855 11/26/23 0856  BP:  (!) 187/98  Pulse:  92  Temp:  98 F (36.7 C)  TempSrc:  Temporal  SpO2:  93%  Weight:  142 lb 11.2 oz (64.7 kg)  Height:  5\' 6"  (1.676 m)  PainSc: 5     Body mass index is 23.03 kg/m.   Incision:  well healed Extremities:   There is a palpable right radial pulse.   Motor and sensory are in tact.   There is a thrill present.  Access is easily palpable but becomes more difficult more proximal  Dialysis Duplex on 11/26/2023: Findings:  +--------------------+----------+-----------------+--------+  AVF                PSV (cm/s)Flow Vol (mL/min)Comments  +--------------------+----------+-----------------+--------+  Native artery inflow   268           424                 +--------------------+----------+-----------------+--------+  AVF Anastomosis        388                               +--------------------+----------+-----------------+--------+     +------------+----------+-------------+----------+----------------+  OUTFLOW VEINPSV (cm/s)Diameter (cm)Depth (cm)    Describe      +------------+----------+-------------+----------+----------------+  Shoulder      119        0.32        0.23                      +------------+----------+-------------+----------+----------------+  Prox UA         88        0.35        0.22                     +------------+----------+-------------+----------+----------------+  Mid UA         129        0.42        0.21   competing branch  +------------+----------+-------------+----------+----------------+  Dist UA        122        0.44        0.15                     +------------+----------+-------------+----------+----------------+  AC Fossa       250        0.65        0.31                     +------------+----------+-------------+----------+----------------+    Assessment/Plan:  This is a 47 y.o. male who is s/p:  right BC AVF and right radial artery endarterectomy on 09/07/2023 by Dr. Karin Lieu.  He had TDC placed 08/06/2023 also by Dr. Karin Lieu.    -the pt does not have evidence of steal. 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  Pt is on HD.  Discussed fistulogram with pt to evaluate fistula and possible intervention.  His TDC is on the right.  -if pt has tunneled catheter, this can be removed at the discretion of the dialysis center once the pt's access has been successfully cannulated to their satisfaction.  -discussed with pt that access does not last forever and will need intervention or even new access at some point.  -we will schedule this for a non dialysis day.  I gave him a squeeze ball to work daily to help fistula mature.    Doreatha Massed, [facility] [phone]  Clinic MD:  Myra Gianotti

## 2023-11-24 DIAGNOSIS — L299 Pruritus, unspecified: Secondary | ICD-10-CM | POA: Diagnosis not present

## 2023-11-24 DIAGNOSIS — T8249XA Other complication of vascular dialysis catheter, initial encounter: Secondary | ICD-10-CM | POA: Diagnosis not present

## 2023-11-24 DIAGNOSIS — Z992 Dependence on renal dialysis: Secondary | ICD-10-CM | POA: Diagnosis not present

## 2023-11-24 DIAGNOSIS — N2581 Secondary hyperparathyroidism of renal origin: Secondary | ICD-10-CM | POA: Diagnosis not present

## 2023-11-24 DIAGNOSIS — N186 End stage renal disease: Secondary | ICD-10-CM | POA: Diagnosis not present

## 2023-11-26 ENCOUNTER — Ambulatory Visit: Admitting: Pharmacist

## 2023-11-26 ENCOUNTER — Ambulatory Visit (HOSPITAL_COMMUNITY)
Admission: RE | Admit: 2023-11-26 | Discharge: 2023-11-26 | Disposition: A | Discharge status: Home / Self Care | Source: Ambulatory Visit | Attending: Vascular Surgery | Admitting: Vascular Surgery

## 2023-11-26 ENCOUNTER — Ambulatory Visit (INDEPENDENT_AMBULATORY_CARE_PROVIDER_SITE_OTHER): Admitting: Physician Assistant

## 2023-11-26 ENCOUNTER — Telehealth: Payer: Self-pay

## 2023-11-26 VITALS — BP 187/98 | HR 92 | Temp 98.0°F | Ht 66.0 in | Wt 142.7 lb

## 2023-11-26 DIAGNOSIS — Z992 Dependence on renal dialysis: Secondary | ICD-10-CM | POA: Diagnosis not present

## 2023-11-26 DIAGNOSIS — N186 End stage renal disease: Secondary | ICD-10-CM | POA: Insufficient documentation

## 2023-11-26 DIAGNOSIS — T8612 Kidney transplant failure: Secondary | ICD-10-CM | POA: Diagnosis not present

## 2023-11-26 NOTE — Telephone Encounter (Signed)
 Attempted to reach pt to schedule his fistulogram. LVM for him to return our call.

## 2023-11-27 DIAGNOSIS — T8249XA Other complication of vascular dialysis catheter, initial encounter: Secondary | ICD-10-CM | POA: Diagnosis not present

## 2023-11-27 DIAGNOSIS — N186 End stage renal disease: Secondary | ICD-10-CM | POA: Diagnosis not present

## 2023-11-27 DIAGNOSIS — Z992 Dependence on renal dialysis: Secondary | ICD-10-CM | POA: Diagnosis not present

## 2023-11-27 DIAGNOSIS — N2581 Secondary hyperparathyroidism of renal origin: Secondary | ICD-10-CM | POA: Diagnosis not present

## 2023-11-28 DIAGNOSIS — M545 Low back pain, unspecified: Secondary | ICD-10-CM | POA: Diagnosis not present

## 2023-11-29 ENCOUNTER — Telehealth: Payer: Self-pay

## 2023-11-29 DIAGNOSIS — N2581 Secondary hyperparathyroidism of renal origin: Secondary | ICD-10-CM | POA: Diagnosis not present

## 2023-11-29 DIAGNOSIS — Z992 Dependence on renal dialysis: Secondary | ICD-10-CM | POA: Diagnosis not present

## 2023-11-29 DIAGNOSIS — T8249XA Other complication of vascular dialysis catheter, initial encounter: Secondary | ICD-10-CM | POA: Diagnosis not present

## 2023-11-29 DIAGNOSIS — N186 End stage renal disease: Secondary | ICD-10-CM | POA: Diagnosis not present

## 2023-11-29 NOTE — Telephone Encounter (Signed)
 Attempted to call for surgery scheduling. LVM

## 2023-12-01 DIAGNOSIS — N2581 Secondary hyperparathyroidism of renal origin: Secondary | ICD-10-CM | POA: Diagnosis not present

## 2023-12-01 DIAGNOSIS — T8249XA Other complication of vascular dialysis catheter, initial encounter: Secondary | ICD-10-CM | POA: Diagnosis not present

## 2023-12-01 DIAGNOSIS — Z992 Dependence on renal dialysis: Secondary | ICD-10-CM | POA: Diagnosis not present

## 2023-12-01 DIAGNOSIS — N186 End stage renal disease: Secondary | ICD-10-CM | POA: Diagnosis not present

## 2023-12-03 DIAGNOSIS — M545 Low back pain, unspecified: Secondary | ICD-10-CM | POA: Diagnosis not present

## 2023-12-04 ENCOUNTER — Other Ambulatory Visit: Payer: Self-pay

## 2023-12-04 DIAGNOSIS — N186 End stage renal disease: Secondary | ICD-10-CM

## 2023-12-04 DIAGNOSIS — N2581 Secondary hyperparathyroidism of renal origin: Secondary | ICD-10-CM | POA: Diagnosis not present

## 2023-12-04 DIAGNOSIS — R519 Headache, unspecified: Secondary | ICD-10-CM | POA: Diagnosis not present

## 2023-12-04 DIAGNOSIS — T8249XA Other complication of vascular dialysis catheter, initial encounter: Secondary | ICD-10-CM | POA: Diagnosis not present

## 2023-12-04 DIAGNOSIS — Z992 Dependence on renal dialysis: Secondary | ICD-10-CM | POA: Diagnosis not present

## 2023-12-04 MED ORDER — SODIUM CHLORIDE 0.9% FLUSH: 3.0000 mL | Freq: Two times a day (BID) | INTRAVENOUS | Status: DC

## 2023-12-04 MED ORDER — SODIUM CHLORIDE 0.9% FLUSH: 3.0000 mL | INTRAVENOUS | Status: DC | PRN

## 2023-12-04 MED ORDER — SODIUM CHLORIDE 0.9 % IV SOLN: 250.0000 mL | INTRAVENOUS | Status: AC | PRN

## 2023-12-06 DIAGNOSIS — Z992 Dependence on renal dialysis: Secondary | ICD-10-CM | POA: Diagnosis not present

## 2023-12-06 DIAGNOSIS — N186 End stage renal disease: Secondary | ICD-10-CM | POA: Diagnosis not present

## 2023-12-06 DIAGNOSIS — N2581 Secondary hyperparathyroidism of renal origin: Secondary | ICD-10-CM | POA: Diagnosis not present

## 2023-12-06 DIAGNOSIS — T8249XA Other complication of vascular dialysis catheter, initial encounter: Secondary | ICD-10-CM | POA: Diagnosis not present

## 2023-12-06 DIAGNOSIS — R519 Headache, unspecified: Secondary | ICD-10-CM | POA: Diagnosis not present

## 2023-12-08 DIAGNOSIS — T8249XA Other complication of vascular dialysis catheter, initial encounter: Secondary | ICD-10-CM | POA: Diagnosis not present

## 2023-12-08 DIAGNOSIS — Z992 Dependence on renal dialysis: Secondary | ICD-10-CM | POA: Diagnosis not present

## 2023-12-08 DIAGNOSIS — N2581 Secondary hyperparathyroidism of renal origin: Secondary | ICD-10-CM | POA: Diagnosis not present

## 2023-12-08 DIAGNOSIS — R519 Headache, unspecified: Secondary | ICD-10-CM | POA: Diagnosis not present

## 2023-12-08 DIAGNOSIS — N186 End stage renal disease: Secondary | ICD-10-CM | POA: Diagnosis not present

## 2023-12-11 DIAGNOSIS — Z992 Dependence on renal dialysis: Secondary | ICD-10-CM | POA: Diagnosis not present

## 2023-12-11 DIAGNOSIS — N186 End stage renal disease: Secondary | ICD-10-CM | POA: Diagnosis not present

## 2023-12-11 DIAGNOSIS — T8249XA Other complication of vascular dialysis catheter, initial encounter: Secondary | ICD-10-CM | POA: Diagnosis not present

## 2023-12-11 DIAGNOSIS — N2581 Secondary hyperparathyroidism of renal origin: Secondary | ICD-10-CM | POA: Diagnosis not present

## 2023-12-13 ENCOUNTER — Telehealth: Payer: Self-pay | Admitting: Pharmacist

## 2023-12-13 DIAGNOSIS — N186 End stage renal disease: Secondary | ICD-10-CM | POA: Diagnosis not present

## 2023-12-13 DIAGNOSIS — Z992 Dependence on renal dialysis: Secondary | ICD-10-CM | POA: Diagnosis not present

## 2023-12-13 DIAGNOSIS — N2581 Secondary hyperparathyroidism of renal origin: Secondary | ICD-10-CM | POA: Diagnosis not present

## 2023-12-13 DIAGNOSIS — T8249XA Other complication of vascular dialysis catheter, initial encounter: Secondary | ICD-10-CM | POA: Diagnosis not present

## 2023-12-13 NOTE — Telephone Encounter (Signed)
 Patient contacted for follow-up of missed appointment for CGM initiation. Appointment rescheduled for 5/21 at 9am.  Since last contact patient reports receiving CGM sensors and readers, but has not yet set them up. Plans to bring to appointment.  Current DM Medications include: Semglee 6 units daily  Medication Plan: -no change    Total time with patient call and documentation of interaction: 4 minutes.

## 2023-12-15 DIAGNOSIS — T8249XA Other complication of vascular dialysis catheter, initial encounter: Secondary | ICD-10-CM | POA: Diagnosis not present

## 2023-12-15 DIAGNOSIS — Z992 Dependence on renal dialysis: Secondary | ICD-10-CM | POA: Diagnosis not present

## 2023-12-15 DIAGNOSIS — N186 End stage renal disease: Secondary | ICD-10-CM | POA: Diagnosis not present

## 2023-12-15 DIAGNOSIS — N2581 Secondary hyperparathyroidism of renal origin: Secondary | ICD-10-CM | POA: Diagnosis not present

## 2023-12-17 ENCOUNTER — Ambulatory Visit (INDEPENDENT_AMBULATORY_CARE_PROVIDER_SITE_OTHER): Admitting: Pharmacist

## 2023-12-17 ENCOUNTER — Encounter: Payer: Self-pay | Admitting: Pharmacist

## 2023-12-17 VITALS — BP 159/95 | HR 91 | Wt 136.8 lb

## 2023-12-17 DIAGNOSIS — E1165 Type 2 diabetes mellitus with hyperglycemia: Secondary | ICD-10-CM | POA: Diagnosis not present

## 2023-12-17 DIAGNOSIS — Z794 Long term (current) use of insulin: Secondary | ICD-10-CM

## 2023-12-17 DIAGNOSIS — E785 Hyperlipidemia, unspecified: Secondary | ICD-10-CM

## 2023-12-17 MED ORDER — FREESTYLE LIBRE 3 PLUS SENSOR MISC: 11 refills | Status: DC

## 2023-12-17 NOTE — Progress Notes (Signed)
 S:     Chief Complaint  Patient presents with   Medication Management    Diabetes follow-up - CGM   48 y.o. male who presents for diabetes evaluation, education, and management. Patient arrives in fairly good spirits and presents without any assistance.   Patient was referred and last seen by Primary Care Provider, Dr. Irby Mannan, on 08/24/23.   PMH is significant for HTN, T2DM, HLD, ESRD on dialysis.  At last visit with Dr. Rishawn Walck, continued DM medication regimen at Semglee  (insulin  glargine) 6 units daily.   Patient reports Diabetes was diagnosed in 2013.   Current diabetes medications include: Semglee  (insulin  glargine) 6 units daily Current hypertension medications include: amlodipine  10 mg, metoprolol  100 mg, valsartan 160 mg, spironolactone 25 mg Current hyperlipidemia medications include: rosuvastatin  10 mg (M/W/F)  Patient reports adherence to taking all medications as prescribed.   Patient denies taking glucose level readings at home.  Patient reports intermittent back pain occurring 2x per week. Reports taking Tylenol  (acetaminophen ) - 8 hour at 650 mg with no relief, and currently goes to physical therapy.   O:   Review of Systems  Constitutional:  Positive for malaise/fatigue.  Musculoskeletal:  Positive for back pain (intermittent - no pain during visit today).  All other systems reviewed and are negative.   Physical Exam Vitals reviewed.  Constitutional:      Appearance: Normal appearance.  Pulmonary:     Effort: Pulmonary effort is normal.  Neurological:     Mental Status: He is alert.  Psychiatric:        Behavior: Behavior normal.        Thought Content: Thought content normal.        Judgment: Judgment normal.     Lab Results  Component Value Date   HGBA1C 6.8 (H) 08/01/2023   Vitals:   12/17/23 0942 12/17/23 0944  BP: (!) 155/93 (!) 159/95  Pulse: 91   SpO2: 100%     Lipid Panel     Component Value Date/Time   CHOL 226 (H)  11/07/2022 1700   TRIG 116 11/07/2022 1700   HDL 49 11/07/2022 1700   CHOLHDL 4.6 11/07/2022 1700   CHOLHDL 6.4 07/04/2012 0957   VLDL 55 (H) 07/04/2012 0957   LDLCALC 156 (H) 11/07/2022 1700    Clinical Atherosclerotic Cardiovascular Disease (ASCVD): No  The 10-year ASCVD risk score (Arnett DK, et al., 2019) is: 22.1%   Values used to calculate the score:     Age: 48 years     Sex: Male     Is Non-Hispanic African American: Yes     Diabetic: Yes     Tobacco smoker: No     Systolic Blood Pressure: 159 mmHg     Is BP treated: Yes     HDL Cholesterol: 49 mg/dL     Total Cholesterol: 226 mg/dL   Patient is participating in a Managed Medicaid Plan:  Yes   A/P: Diabetes longstanding 12 years (2013) currently on dialysis who is in process of transplant evaluation and desires optimal glucose control. Patient is able to verbalize appropriate hypoglycemia management plan. Medication adherence appears good. Initiate Libre sensor to analyze glucose levels throughout the day and assist in medication management.  -Continued basal insulin  Semglee  (insulin  glargine) 6 units daily in the morning.  -Administered and counseled patient on proper administration and use of Libre sensor and reader. -Patient educated on purpose, proper use, and potential adverse effects.  -Extensively discussed pathophysiology of diabetes, recommended lifestyle  interventions, dietary effects on blood sugar control.  -Counseled on s/sx of and management of hypoglycemia.  -Next A1c anticipated at next appointment.   ASCVD risk - primary prevention in patient with diabetes. Last LDL is 156 not at goal of <40 mg/dL. ASCVD risk factors include T2DM, HLD and 10-year ASCVD risk score of 28.9. High intensity statin indicated, although patient endorses intolerance to higher doses of atorvastatin .  -Continued rosuvastatin  10 mg (M/W/F).  -Consider repeat Lipid panel at next blood draw  Intermittent back pain currently managed  with acetaminophen  650mg  daily - encouraged to consider increasing frequency of dosing to at least twice daily, or possibly TID to improve pain control throughout the entire day.  - scheduled with PCP, Dr. Derril Flint in ~ 2 weeks for evaluation  Written patient instructions provided. Patient verbalized understanding of treatment plan.  Total time in face to face counseling 42 minutes.    Follow-up:  Pharmacist 12/31/23 PCP clinic visit in 01/02/24 Patient seen with Teofilo Fellers, PharmD Candidate.

## 2023-12-17 NOTE — Assessment & Plan Note (Signed)
 ASCVD risk - primary prevention in patient with diabetes. Last LDL is 156 not at goal of <91 mg/dL. ASCVD risk factors include T2DM, HLD and 10-year ASCVD risk score of 28.9. High intensity statin indicated, although patient endorses intolerance to higher doses of atorvastatin .  -Continued rosuvastatin  10 mg (M/W/F).  -Consider repeat Lipid panel at next blood draw

## 2023-12-17 NOTE — Assessment & Plan Note (Signed)
 Diabetes longstanding 12 years (2013) currently on dialysis who is in process of transplant evaluation and desires optimal glucose control. Patient is able to verbalize appropriate hypoglycemia management plan. Medication adherence appears good. Initiate Libre sensor to analyze glucose levels throughout the day and assist in medication management.  -Continued basal insulin  Semglee  (insulin  glargine) 6 units daily in the morning.  -Administered and counseled patient on proper administration and use of Libre sensor and reader. -Patient educated on purpose, proper use, and potential adverse effects.  -Extensively discussed pathophysiology of diabetes, recommended lifestyle interventions, dietary effects on blood sugar control.  -Counseled on s/sx of and management of hypoglycemia.

## 2023-12-17 NOTE — Progress Notes (Signed)
 Reviewed and agree with Dr Macky Lower plan.

## 2023-12-17 NOTE — Patient Instructions (Signed)
 It was nice to see you today!  Your goal blood sugar is 80-130 before eating and less than 180 after eating.  Medication Changes: Continue all other medication the same.   Keep up the good work with diet and exercise. Aim for a diet full of vegetables, fruit and lean meats (chicken, [place], fish). Try to limit salt intake by eating fresh or frozen vegetables (instead of canned), rinse canned vegetables prior to cooking and do not add any additional salt to meals.

## 2023-12-18 ENCOUNTER — Telehealth: Payer: Self-pay

## 2023-12-18 DIAGNOSIS — N2581 Secondary hyperparathyroidism of renal origin: Secondary | ICD-10-CM | POA: Diagnosis not present

## 2023-12-18 DIAGNOSIS — R52 Pain, unspecified: Secondary | ICD-10-CM | POA: Diagnosis not present

## 2023-12-18 DIAGNOSIS — R509 Fever, unspecified: Secondary | ICD-10-CM | POA: Diagnosis not present

## 2023-12-18 DIAGNOSIS — T8249XA Other complication of vascular dialysis catheter, initial encounter: Secondary | ICD-10-CM | POA: Diagnosis not present

## 2023-12-18 DIAGNOSIS — N186 End stage renal disease: Secondary | ICD-10-CM | POA: Diagnosis not present

## 2023-12-18 DIAGNOSIS — Z992 Dependence on renal dialysis: Secondary | ICD-10-CM | POA: Diagnosis not present

## 2023-12-18 NOTE — Telephone Encounter (Signed)
 Appt: -received message associated with this phone # at the specified time.  Pt stated he is having a procedure tomorrow but did not know what time.  -attempted phone # x 2 & LM surgery time is at 830 but had to check in at 1 hour early.

## 2023-12-19 ENCOUNTER — Telehealth: Payer: Self-pay

## 2023-12-19 ENCOUNTER — Encounter (HOSPITAL_COMMUNITY)
Admission: RE | Disposition: A | Discharge status: Home / Self Care | Payer: Self-pay | Source: Home / Self Care | Attending: Vascular Surgery

## 2023-12-19 ENCOUNTER — Ambulatory Visit (HOSPITAL_COMMUNITY)
Admission: RE | Admit: 2023-12-19 | Discharge: 2023-12-19 | Disposition: A | Discharge status: Home / Self Care | Attending: Vascular Surgery | Admitting: Vascular Surgery

## 2023-12-19 ENCOUNTER — Other Ambulatory Visit: Payer: Self-pay

## 2023-12-19 DIAGNOSIS — N186 End stage renal disease: Secondary | ICD-10-CM | POA: Insufficient documentation

## 2023-12-19 DIAGNOSIS — Z539 Procedure and treatment not carried out, unspecified reason: Secondary | ICD-10-CM | POA: Insufficient documentation

## 2023-12-19 LAB — POCT I-STAT, CHEM 8
BUN: 27 mg/dL — ABNORMAL HIGH (ref 6–20)
Calcium, Ion: 1.09 mmol/L — ABNORMAL LOW (ref 1.15–1.40)
Chloride: 94 mmol/L — ABNORMAL LOW (ref 98–111)
Creatinine, Ser: 6.5 mg/dL — ABNORMAL HIGH (ref 0.61–1.24)
Glucose, Bld: 219 mg/dL — ABNORMAL HIGH (ref 70–99)
HCT: 38 % — ABNORMAL LOW (ref 39.0–52.0)
Hemoglobin: 12.9 g/dL — ABNORMAL LOW (ref 13.0–17.0)
Potassium: 3.7 mmol/L (ref 3.5–5.1)
Sodium: 137 mmol/L (ref 135–145)
TCO2: 32 mmol/L (ref 22–32)

## 2023-12-19 SURGERY — A/V FISTULAGRAM
Anesthesia: LOCAL | Laterality: Right

## 2023-12-19 NOTE — Telephone Encounter (Signed)
 Attempted to call for surgery scheduling. LVM

## 2023-12-19 NOTE — Progress Notes (Signed)
 Patient concerned about elevated temperature, chronic cough which has been present for 1 month. Discussed that we can continue with fistulagram and that he could reach out to his primary care this afternoon.  He asked to reschedule and plans to reach out to his primary today.  Kayla Part MD

## 2023-12-19 NOTE — Progress Notes (Signed)
 Notified Adam Davis of Temp 100.0 x 2 times taken orally. Patient stated he has not been sick. Per Bridgette Campus / DR. Rosalva Comber we are okay to proceed.

## 2023-12-20 DIAGNOSIS — N186 End stage renal disease: Secondary | ICD-10-CM | POA: Diagnosis not present

## 2023-12-20 DIAGNOSIS — T8249XA Other complication of vascular dialysis catheter, initial encounter: Secondary | ICD-10-CM | POA: Diagnosis not present

## 2023-12-20 DIAGNOSIS — N2581 Secondary hyperparathyroidism of renal origin: Secondary | ICD-10-CM | POA: Diagnosis not present

## 2023-12-20 DIAGNOSIS — R52 Pain, unspecified: Secondary | ICD-10-CM | POA: Diagnosis not present

## 2023-12-20 DIAGNOSIS — Z992 Dependence on renal dialysis: Secondary | ICD-10-CM | POA: Diagnosis not present

## 2023-12-20 DIAGNOSIS — R509 Fever, unspecified: Secondary | ICD-10-CM | POA: Diagnosis not present

## 2023-12-22 DIAGNOSIS — T8249XA Other complication of vascular dialysis catheter, initial encounter: Secondary | ICD-10-CM | POA: Diagnosis not present

## 2023-12-22 DIAGNOSIS — N186 End stage renal disease: Secondary | ICD-10-CM | POA: Diagnosis not present

## 2023-12-22 DIAGNOSIS — R52 Pain, unspecified: Secondary | ICD-10-CM | POA: Diagnosis not present

## 2023-12-22 DIAGNOSIS — N2581 Secondary hyperparathyroidism of renal origin: Secondary | ICD-10-CM | POA: Diagnosis not present

## 2023-12-22 DIAGNOSIS — Z992 Dependence on renal dialysis: Secondary | ICD-10-CM | POA: Diagnosis not present

## 2023-12-22 DIAGNOSIS — R509 Fever, unspecified: Secondary | ICD-10-CM | POA: Diagnosis not present

## 2023-12-24 ENCOUNTER — Ambulatory Visit (INDEPENDENT_AMBULATORY_CARE_PROVIDER_SITE_OTHER): Admitting: Student

## 2023-12-24 ENCOUNTER — Telehealth: Payer: Self-pay

## 2023-12-24 ENCOUNTER — Encounter: Payer: Self-pay | Admitting: Student

## 2023-12-24 ENCOUNTER — Ambulatory Visit
Admission: RE | Admit: 2023-12-24 | Discharge: 2023-12-24 | Disposition: A | Discharge status: Home / Self Care | Source: Ambulatory Visit | Attending: Family Medicine | Admitting: Family Medicine

## 2023-12-24 VITALS — BP 180/108 | HR 86 | Temp 98.1°F | Ht 66.0 in | Wt 139.4 lb

## 2023-12-24 DIAGNOSIS — M545 Low back pain, unspecified: Secondary | ICD-10-CM | POA: Diagnosis not present

## 2023-12-24 DIAGNOSIS — R059 Cough, unspecified: Secondary | ICD-10-CM

## 2023-12-24 DIAGNOSIS — I10 Essential (primary) hypertension: Secondary | ICD-10-CM | POA: Diagnosis not present

## 2023-12-24 MED ORDER — CYCLOBENZAPRINE HCL 5 MG PO TABS: 5.0000 mg | ORAL_TABLET | Freq: Three times a day (TID) | ORAL | 0 refills | Status: DC | PRN

## 2023-12-24 MED ORDER — BENZONATATE 100 MG PO CAPS: 100.0000 mg | ORAL_CAPSULE | Freq: Two times a day (BID) | ORAL | 0 refills | Status: DC | PRN

## 2023-12-24 MED ORDER — VALSARTAN 320 MG PO TABS: 320.0000 mg | ORAL_TABLET | Freq: Every evening | ORAL | 0 refills | Status: DC

## 2023-12-24 MED ORDER — LIDOCAINE 5 % EX PTCH: 1.0000 | MEDICATED_PATCH | CUTANEOUS | 0 refills | Status: DC

## 2023-12-24 NOTE — Assessment & Plan Note (Signed)
 Elevated blood pressure today x2, asymptomatic without reported chest pain, shortness of breath. Antihypertensive medication taken later than usual. On antiHTN as above. Increase Valsartan today to 320 mg. Follow up in one week, strict return precautions provided if consistently elevated at home or symptomatic.

## 2023-12-24 NOTE — Telephone Encounter (Signed)
 Pharmacy Patient Advocate Encounter   Received notification from CoverMyMeds that prior authorization for LIDOCAINE  5% PATCHES is required/requested.   Insurance verification completed.   The patient is insured through [facility] .   PA required; PA submitted to above mentioned insurance via CoverMyMeds Key/confirmation #/[id number] [employer]. Status is pending

## 2023-12-24 NOTE — Telephone Encounter (Signed)
 Attempted to reach pt to schedule his fistulogram. Left VM for him to return our call.

## 2023-12-24 NOTE — Progress Notes (Signed)
    SUBJECTIVE:   CHIEF COMPLAINT / HPI:   Cough  Lower Back Pain HTN: The patient, with a history of dialysis-dependent renal disease, presents with a one-week history of cough and back pain. The cough is productive with clear sputum. He denies smoking. He was evaluated for the cough at his dialysis center, where he received IV antibiotics for suspected pneumonia or a possible blood clot. He reports that the antibiotics seemed to help.  The back pain, which has been ongoing for a couple of months, is attributed to sciatica and arthritis. The pain occasionally radiates down the leg but not all the way. He has been attending physical therapy, which he reports as helpful. He has also been prescribed pain medication, which he did not specify, by a provider at this clinic approximately two months ago.   PERTINENT  PMH / PSH: Reviewed   OBJECTIVE:   BP (!) 180/108   Pulse 86   Temp 98.1 F (36.7 C) (Oral)   Ht 5\' 6"  (1.676 m)   Wt 139 lb 6 oz (63.2 kg)   SpO2 100%   BMI 22.50 kg/m   General: Alert and oriented in no apparent distress Heart: Regular rate and rhythm with no murmurs appreciated Lungs: CTA bilaterally, no wheezing, no focal diminishment Abdomen: Bowel sounds present, no abdominal pain Skin: Warm and dry Back: FROM, normal ambulation, LE strength 5/5, good sensation, TTP diffuse Tspine and TTP right paraspinal lumbar, no palpable stepoffs.   ASSESSMENT/PLAN:   Assessment & Plan Essential hypertension, benign Elevated blood pressure today x2, asymptomatic without reported chest pain, shortness of breath. Antihypertensive medication taken later than usual. On antiHTN as above. Increase Valsartan today to 320 mg. Follow up in one week, strict return precautions provided if consistently elevated at home or symptomatic.  Cough, unspecified type Cough with clear sputum for one week. Fever resolved. Likely viral infection. Pneumonia unlikely. Given comorbidities, ordering CXR  for PNA assessment.  - Prescribe antitussive  medication. - cxr - follow up 1 week for close reassessment  Low back pain without sciatica, unspecified back pain laterality, unspecified chronicity Chronic back pain with leg radiation. Previous xray mild DDD L5-S1. Physical therapy beneficial. No neurologic deficits or red flags  - Continue physical therapy. - Prescribe muscle relaxants. - Prescribe lidocaine  patches. - Advise to contact if pain management is ineffective.      [name], MD [facility]

## 2023-12-24 NOTE — Patient Instructions (Addendum)
 It was great to see you today! Thank you for choosing [facility] for your primary care.  Today we addressed: -For cough - we will get a chest xray [address]  -flexeril three times a day as needed for muscle pain  -lidocaine  patch on the lower back -tessalon  pearls for cough as needed, KEEP OUT OF REACH OF CHILDREN - return in 1-2 weeks for follow up  - Continue with amlodipine  10 mg, metoprolol  100 mg, valsartan 160 mg, spironolactone 25 mg   If you haven't already, sign up for My Chart to have easy access to your labs results, and communication with your primary care physician.   Please arrive 15 minutes before your appointment to ensure smooth check in process.  We appreciate your efforts in making this happen.  Thank you for allowing me to participate in your care, [name], MD 12/24/2023, 10:33 AM PGY-3, [facility]

## 2023-12-24 NOTE — Assessment & Plan Note (Signed)
 Chronic back pain with leg radiation. Previous xray mild DDD L5-S1. Physical therapy beneficial. No neurologic deficits or red flags  - Continue physical therapy. - Prescribe muscle relaxants. - Prescribe lidocaine  patches. - Advise to contact if pain management is ineffective.

## 2023-12-25 DIAGNOSIS — R509 Fever, unspecified: Secondary | ICD-10-CM | POA: Diagnosis not present

## 2023-12-25 DIAGNOSIS — Z992 Dependence on renal dialysis: Secondary | ICD-10-CM | POA: Diagnosis not present

## 2023-12-25 DIAGNOSIS — N2581 Secondary hyperparathyroidism of renal origin: Secondary | ICD-10-CM | POA: Diagnosis not present

## 2023-12-25 DIAGNOSIS — N186 End stage renal disease: Secondary | ICD-10-CM | POA: Diagnosis not present

## 2023-12-25 DIAGNOSIS — T8249XA Other complication of vascular dialysis catheter, initial encounter: Secondary | ICD-10-CM | POA: Diagnosis not present

## 2023-12-25 NOTE — Telephone Encounter (Signed)
 Pharmacy Patient Advocate Encounter  Received notification from [facility] that Prior Authorization for LIDOCAINE  5% PATCHES has been DENIED.  Full denial letter will be uploaded to the media tab. See denial reason below.  No denial reason faxed yet.   Lidocaine  4% patches available OTC.   PA #/Case ID/Reference #: [id number]

## 2023-12-26 ENCOUNTER — Other Ambulatory Visit: Payer: Self-pay | Admitting: Student

## 2023-12-26 DIAGNOSIS — N186 End stage renal disease: Secondary | ICD-10-CM | POA: Diagnosis not present

## 2023-12-26 DIAGNOSIS — T8612 Kidney transplant failure: Secondary | ICD-10-CM | POA: Diagnosis not present

## 2023-12-26 DIAGNOSIS — Z992 Dependence on renal dialysis: Secondary | ICD-10-CM | POA: Diagnosis not present

## 2023-12-26 DIAGNOSIS — J189 Pneumonia, unspecified organism: Secondary | ICD-10-CM

## 2023-12-26 NOTE — Progress Notes (Signed)
 Attempted to call patient again about chest xray, left HIPAA compliant VM.   UPDATED PLAN: Because I am unable to see what IV abx the nephrologist is giving the patient after thorough review of his chart, I cannot send in the Augmentin until I know what he is currently receiving.   If he returns the call to clinic staff:   He can either: Call his nephrologist to update them that he has multifocal PNA on CXR and ask if he needs treatment or if his current regimen is covering him. Then let us  know   OR   Go to the ED for IV abx given his comorbidities and is on cellcept  with history of renal transplant and I have no idea what abx he is receiving.   Still needs to come in in one week   I have not yet sent in ANY abx for treatment

## 2023-12-26 NOTE — Progress Notes (Addendum)
 error

## 2023-12-27 DIAGNOSIS — T8249XA Other complication of vascular dialysis catheter, initial encounter: Secondary | ICD-10-CM | POA: Diagnosis not present

## 2023-12-27 DIAGNOSIS — R509 Fever, unspecified: Secondary | ICD-10-CM | POA: Diagnosis not present

## 2023-12-27 DIAGNOSIS — N2581 Secondary hyperparathyroidism of renal origin: Secondary | ICD-10-CM | POA: Diagnosis not present

## 2023-12-27 DIAGNOSIS — Z992 Dependence on renal dialysis: Secondary | ICD-10-CM | POA: Diagnosis not present

## 2023-12-27 DIAGNOSIS — R52 Pain, unspecified: Secondary | ICD-10-CM | POA: Diagnosis not present

## 2023-12-27 DIAGNOSIS — N186 End stage renal disease: Secondary | ICD-10-CM | POA: Diagnosis not present

## 2023-12-28 ENCOUNTER — Encounter: Payer: Self-pay | Admitting: Student

## 2023-12-28 LAB — COVID-19, FLU A+B NAA
Influenza A, NAA: NOT DETECTED
Influenza B, NAA: NOT DETECTED
SARS-CoV-2, NAA: NOT DETECTED

## 2023-12-29 DIAGNOSIS — R52 Pain, unspecified: Secondary | ICD-10-CM | POA: Diagnosis not present

## 2023-12-29 DIAGNOSIS — N2581 Secondary hyperparathyroidism of renal origin: Secondary | ICD-10-CM | POA: Diagnosis not present

## 2023-12-29 DIAGNOSIS — R509 Fever, unspecified: Secondary | ICD-10-CM | POA: Diagnosis not present

## 2023-12-29 DIAGNOSIS — Z992 Dependence on renal dialysis: Secondary | ICD-10-CM | POA: Diagnosis not present

## 2023-12-29 DIAGNOSIS — N186 End stage renal disease: Secondary | ICD-10-CM | POA: Diagnosis not present

## 2023-12-29 DIAGNOSIS — T8249XA Other complication of vascular dialysis catheter, initial encounter: Secondary | ICD-10-CM | POA: Diagnosis not present

## 2023-12-31 ENCOUNTER — Ambulatory Visit: Admitting: Pharmacist

## 2023-12-31 ENCOUNTER — Encounter: Payer: Self-pay | Admitting: Pharmacist

## 2023-12-31 VITALS — BP 158/101 | HR 95 | Wt 133.4 lb

## 2023-12-31 DIAGNOSIS — E1165 Type 2 diabetes mellitus with hyperglycemia: Secondary | ICD-10-CM | POA: Diagnosis not present

## 2023-12-31 DIAGNOSIS — Z794 Long term (current) use of insulin: Secondary | ICD-10-CM

## 2023-12-31 NOTE — Patient Instructions (Addendum)
 It was nice to see you today!  Your goal blood sugar is 80-130 before eating and less than 180 after eating.  Medication Changes: Increase to Semglee  (insulin  glargine) 8 units every morning  Continue all other medication the same.  Monitor blood sugars at home and keep a log (glucometer or piece of paper) to bring with you to your next visit.  Keep up the good work with diet and exercise. Aim for a diet full of vegetables, fruit and lean meats (chicken, [place], fish). Try to limit salt intake by eating fresh or frozen vegetables (instead of canned), rinse canned vegetables prior to cooking and do not add any additional salt to meals.

## 2023-12-31 NOTE — Progress Notes (Signed)
 S:     Chief Complaint  Patient presents with   Medication Management    CGM - Diabetes Control   48 y.o. male who presents for diabetes evaluation, education, and management. Patient arrives ill-appearing and presents without any assistance.   Patient was referred and last seen by Primary Care Provider, Dr. Eveleen Hinds, on 12/16/2023.  At last visit on 12/17/2023, patient was continued on Semglee  (insulin  glargine) 6 units daily in the morning. Patient also received a Libre sensor and reader.   PMH is significant for HTN, T2DM, HLD, ESRD on dialysis.  Patient reports Diabetes was diagnosed in 2013.   Current diabetes medications include: Semglee  (inuslin glargine) 6 units daily Current hypertension medications include: amlodipine  10 mg, metoprolol  100 mg, valsartan  160 mg Current hyperlipidemia medications include: rosuvastatin  10 mg (M/W/F)  Patient reports adherence to taking all medications as prescribed. Patient reports checking blood glucose level with Libre sensor and reader.   Insurance coverage: Blue Cross Blue Shield + Medicaid Insurance  Patient reports one episode of hypoglycemia since the last visit. Noted patient diagnosed with multifocal pneumonia on 12/24/23 and started on treatment with vancomycin + cefepime with dialysis.   O:   Review of Systems  Constitutional:  Positive for malaise/fatigue.  Respiratory:  Positive for shortness of breath.   Musculoskeletal:  Positive for myalgias.  Neurological:  Positive for weakness.    Physical Exam Constitutional:      Appearance: He is ill-appearing.  Psychiatric:        Mood and Affect: Affect is flat.    7 day average blood glucose: 245 mg/dL  Time in Goal:  - Time in range 70-180: 21% - Time above range: 79% - Time below range: 0%  Averages within prior 7 days: [phone]) 330-1975: 237 mg/dL 1610-9604: 540 mg/dL 9811-9147: 829 mg/dL 5621-3086: 578 mg/dL  Lab Results  Component Value Date   HGBA1C 6.8 (H)  08/01/2023   Vitals:   12/31/23 1055 12/31/23 1056  BP: (!) 156/102 (!) 158/101  Pulse:  95    Lipid Panel     Component Value Date/Time   CHOL 226 (H) 11/07/2022 1700   TRIG 116 11/07/2022 1700   HDL 49 11/07/2022 1700   CHOLHDL 4.6 11/07/2022 1700   CHOLHDL 6.4 07/04/2012 0957   VLDL 55 (H) 07/04/2012 0957   LDLCALC 156 (H) 11/07/2022 1700    Clinical Atherosclerotic Cardiovascular Disease (ASCVD): No  The 10-year ASCVD risk score (Arnett DK, et al., 2019) is: 21.9%   Values used to calculate the score:     Age: 36 years     Sex: Male     Is Non-Hispanic African American: Yes     Diabetic: Yes     Tobacco smoker: No     Systolic Blood Pressure: 158 mmHg     Is BP treated: Yes     HDL Cholesterol: 49 mg/dL     Total Cholesterol: 226 mg/dL   Patient is participating in a Managed Medicaid Plan:  Yes   A/P: Diabetes longstanding 12 years (2013) currently on dialysis. Patient is able to verbalize appropriate hypoglycemia management plan. Medication adherence appears good. Patient reported inability to pick up Libre sensor at pharmacy due to cost, a replacement sensor was applied in clinic. Discussed potential insulin  changes, patient prefers to remain on once-daily insulin  dosing. -Increased dose of basal insulin  Semglee  (insulin  glargine) from 6 units to 8 units daily in the morning -Provided samples of Libre 3 Plus sensor for use during  the time until we see him to initiate insurance preferred CGM. - Dexcom G7 sensor and receiver preferred by patient's insurance. Will switch patient to Dexcom at next appointment.  Sensor prescription sent to pharmacy.  Receiver will be supplied as sample at next visit.  -Patient educated on purpose, proper use, and potential adverse effects. -Extensively discussed pathophysiology of diabetes, recommended lifestyle interventions, dietary effects on blood sugar control.  -Counseled on s/sx of and management of hypoglycemia.  -Next A1c  anticipated at next appointment.   ASCVD risk - primary prevention in patient with diabetes. Last LDL is 156 mg/dL not at goal of <16 mg/dL. ASCVD risk factors include T2DM, HLD, and 10-year ASCVD risk score of 28.9.   -Continued rosuvastatin  10 mg (M/W/F)  Hypertension remains uncontrolled, currently 158/101 mmHg. Blood pressure goal of <130/80 mmHg. Medication adherence appears good, but patient reports discontinuing use of spironolactone. Blood pressure control may be suboptimal due to recent illness. No adjustments made at this appointment.   Written patient instructions provided. Patient verbalized understanding of treatment plan.  Total time in face to face counseling 34 minutes.    Follow-up:  Pharmacist 01/23/24 PCP clinic visit in TBD Patient seen with Volney Grumbles, PharmD, PGY-1 pharmacy resident.   Contacted [facility] (patient's home pharmacy) to clarify cost of Libre 3 Sensor. Notified that patient's insurance prefers Dexcom.  Patient contacted regarding Libre 3 Plus Sensor. Additional samples of Libre 3 Plus Sensor left at the front desk for the patient to pick-up at [facility].  He is aware that we will switch to Dexcom at next visit with me.   Total time with patient call and documentation of interaction: 12 minutes.

## 2024-01-01 ENCOUNTER — Other Ambulatory Visit: Payer: Self-pay

## 2024-01-01 ENCOUNTER — Telehealth: Payer: Self-pay

## 2024-01-01 DIAGNOSIS — N186 End stage renal disease: Secondary | ICD-10-CM

## 2024-01-01 DIAGNOSIS — N2581 Secondary hyperparathyroidism of renal origin: Secondary | ICD-10-CM | POA: Diagnosis not present

## 2024-01-01 DIAGNOSIS — Z992 Dependence on renal dialysis: Secondary | ICD-10-CM | POA: Diagnosis not present

## 2024-01-01 DIAGNOSIS — T8249XA Other complication of vascular dialysis catheter, initial encounter: Secondary | ICD-10-CM | POA: Diagnosis not present

## 2024-01-01 NOTE — Telephone Encounter (Signed)
 Attempted to call for surgery scheduling. LVM

## 2024-01-02 ENCOUNTER — Ambulatory Visit: Admitting: Family Medicine

## 2024-01-02 ENCOUNTER — Encounter: Payer: Self-pay | Admitting: Family Medicine

## 2024-01-02 VITALS — BP 142/80 | HR 98 | Wt 136.0 lb

## 2024-01-02 DIAGNOSIS — I10 Essential (primary) hypertension: Secondary | ICD-10-CM | POA: Diagnosis not present

## 2024-01-02 DIAGNOSIS — J189 Pneumonia, unspecified organism: Secondary | ICD-10-CM

## 2024-01-02 DIAGNOSIS — M545 Low back pain, unspecified: Secondary | ICD-10-CM

## 2024-01-02 NOTE — Progress Notes (Signed)
    SUBJECTIVE:   CHIEF COMPLAINT / HPI:   Patient presents for follow up of back pain. Last seen by Dr. Eveleen Hinds on 4/28 for couple months of ongoing back pain. Back pain largely on R side and no longer radiating down leg. Reports physical therapy helps. Trialed flexeril  and lidocaine  patches. Lidocaine  patches did not help. Reports flexeril  helps a bit.   Feels like cough is better. Finished course of antibiotics. Last dose of antibiotics completed on Wednesday.    PERTINENT  PMH / PSH:  ESRD on dialysis   OBJECTIVE:   BP (!) 142/80   Pulse 98   Wt 136 lb (61.7 kg)   SpO2 96%   BMI 21.95 kg/m   General: A&O, NAD, thin  HEENT: No sign of trauma, EOM grossly intact Cardiac: RRR, no m/r/g Respiratory: CTAB, normal WOB,  GI: Soft, NTTP, non-distended  Extremities: no pain with palpation of paraspinal muscles. No flank pain. Moves all extremities spontaneously.  Neuro: Normal gait, moves all four extremities appropriately. Psych: Appropriate mood and affect  ASSESSMENT/PLAN:   Assessment & Plan Low back pain without sciatica, unspecified back pain laterality, unspecified chronicity A bit improved with flexeril  and PT. Lidocaine  patches did not help. Back pain is mainly on R side and does not radiate. Seems MSK in nature. Does not have any red flag symptoms.  - continue physical therapy  - continue flexeril  5mg  TID prn - will follow up with ortho if pain is not better for more imaging  Essential hypertension, benign BP improved on recheck, still mildly elevated. Will continue on current regimen on valsartan  320mg  daily and amlodipine  10 mg daily.  Multifocal pneumonia Reports finished abx course prescribed by nephrology. Feeling somewhat better but still has some SOB. Ambulatory pulse ox with O2 in 94-100% range. Lungs CTAB. Will have patient follow up in 1 week if symptoms are not improved.    [name], MD [facility]

## 2024-01-02 NOTE — Patient Instructions (Addendum)
 It was great to see you today! Thank you for choosing [facility] for your primary care. Adam Davis was seen for back pain follow up.  Today we addressed: Your back pain: Call PT to schedule an appointment: [phone]. Lets continue with the flexeril  for now and finish PT and see how you do Your blood pressure looks good. Lets continue with your current medication regimen If your breathing is not better in one week, I need you to come back for a follow up  If you haven't already, sign up for My Chart to have easy access to your labs results, and communication with your primary care physician.  You should return to our clinic In one week if symptoms do not improve.  I recommend that you always bring your medications to each appointment as this makes it easy to ensure you are on the correct medications and helps us  not miss refills when you need them.  Please arrive 15 minutes before your appointment to ensure smooth check in process.  We appreciate your efforts in making this happen.  Please call the clinic at [phone] if your symptoms worsen or you have any concerns.  Thank you for allowing me to participate in your care, Adam Naas, MD 01/02/2024, 9:43 AM PGY-1, [facility]

## 2024-01-02 NOTE — Assessment & Plan Note (Addendum)
 A bit improved with flexeril  and PT. Lidocaine  patches did not help. Back pain is mainly on R side and does not radiate. Seems MSK in nature. Does not have any red flag symptoms.  - continue physical therapy  - continue flexeril  5mg  TID prn - will follow up with ortho if pain is not better for more imaging

## 2024-01-02 NOTE — Assessment & Plan Note (Signed)
 BP improved on recheck, still mildly elevated. Will continue on current regimen on valsartan  320mg  daily and amlodipine  10 mg daily.

## 2024-01-03 DIAGNOSIS — T8249XA Other complication of vascular dialysis catheter, initial encounter: Secondary | ICD-10-CM | POA: Diagnosis not present

## 2024-01-03 DIAGNOSIS — N186 End stage renal disease: Secondary | ICD-10-CM | POA: Diagnosis not present

## 2024-01-03 DIAGNOSIS — Z992 Dependence on renal dialysis: Secondary | ICD-10-CM | POA: Diagnosis not present

## 2024-01-03 DIAGNOSIS — N2581 Secondary hyperparathyroidism of renal origin: Secondary | ICD-10-CM | POA: Diagnosis not present

## 2024-01-04 ENCOUNTER — Telehealth: Payer: Self-pay

## 2024-01-04 NOTE — Telephone Encounter (Signed)
 Patient called to request his fistulogram to be rescheduled to a Monday or Friday.  Patient agreed to have Dr. Edgardo Goodwill perform on Friday, 5/16.

## 2024-01-05 DIAGNOSIS — T8249XA Other complication of vascular dialysis catheter, initial encounter: Secondary | ICD-10-CM | POA: Diagnosis not present

## 2024-01-05 DIAGNOSIS — N2581 Secondary hyperparathyroidism of renal origin: Secondary | ICD-10-CM | POA: Diagnosis not present

## 2024-01-05 DIAGNOSIS — Z992 Dependence on renal dialysis: Secondary | ICD-10-CM | POA: Diagnosis not present

## 2024-01-05 DIAGNOSIS — N186 End stage renal disease: Secondary | ICD-10-CM | POA: Diagnosis not present

## 2024-01-08 DIAGNOSIS — N186 End stage renal disease: Secondary | ICD-10-CM | POA: Diagnosis not present

## 2024-01-08 DIAGNOSIS — Z992 Dependence on renal dialysis: Secondary | ICD-10-CM | POA: Diagnosis not present

## 2024-01-08 DIAGNOSIS — N2581 Secondary hyperparathyroidism of renal origin: Secondary | ICD-10-CM | POA: Diagnosis not present

## 2024-01-08 DIAGNOSIS — T8249XA Other complication of vascular dialysis catheter, initial encounter: Secondary | ICD-10-CM | POA: Diagnosis not present

## 2024-01-09 NOTE — Progress Notes (Signed)
 Reviewed and agree with Dr Macky Lower plan.

## 2024-01-10 DIAGNOSIS — N186 End stage renal disease: Secondary | ICD-10-CM | POA: Diagnosis not present

## 2024-01-10 DIAGNOSIS — Z992 Dependence on renal dialysis: Secondary | ICD-10-CM | POA: Diagnosis not present

## 2024-01-10 DIAGNOSIS — N2581 Secondary hyperparathyroidism of renal origin: Secondary | ICD-10-CM | POA: Diagnosis not present

## 2024-01-10 DIAGNOSIS — T8249XA Other complication of vascular dialysis catheter, initial encounter: Secondary | ICD-10-CM | POA: Diagnosis not present

## 2024-01-11 ENCOUNTER — Other Ambulatory Visit: Payer: Self-pay

## 2024-01-11 ENCOUNTER — Ambulatory Visit (HOSPITAL_COMMUNITY)
Admission: RE | Admit: 2024-01-11 | Discharge: 2024-01-11 | Disposition: A | Discharge status: Home / Self Care | Attending: Vascular Surgery | Admitting: Vascular Surgery

## 2024-01-11 ENCOUNTER — Encounter (HOSPITAL_COMMUNITY)
Admission: RE | Disposition: A | Discharge status: Home / Self Care | Payer: Self-pay | Source: Home / Self Care | Attending: Vascular Surgery

## 2024-01-11 DIAGNOSIS — N185 Chronic kidney disease, stage 5: Secondary | ICD-10-CM | POA: Diagnosis not present

## 2024-01-11 DIAGNOSIS — I120 Hypertensive chronic kidney disease with stage 5 chronic kidney disease or end stage renal disease: Secondary | ICD-10-CM | POA: Insufficient documentation

## 2024-01-11 DIAGNOSIS — T82898A Other specified complication of vascular prosthetic devices, implants and grafts, initial encounter: Secondary | ICD-10-CM | POA: Diagnosis not present

## 2024-01-11 DIAGNOSIS — E1122 Type 2 diabetes mellitus with diabetic chronic kidney disease: Secondary | ICD-10-CM | POA: Insufficient documentation

## 2024-01-11 DIAGNOSIS — N186 End stage renal disease: Secondary | ICD-10-CM | POA: Insufficient documentation

## 2024-01-11 DIAGNOSIS — Z992 Dependence on renal dialysis: Secondary | ICD-10-CM | POA: Diagnosis not present

## 2024-01-11 HISTORY — PX: A/V FISTULAGRAM: CATH118298

## 2024-01-11 LAB — POCT I-STAT, CHEM 8
BUN: 20 mg/dL (ref 6–20)
Calcium, Ion: 1.04 mmol/L — ABNORMAL LOW (ref 1.15–1.40)
Chloride: 94 mmol/L — ABNORMAL LOW (ref 98–111)
Creatinine, Ser: 5.7 mg/dL — ABNORMAL HIGH (ref 0.61–1.24)
Glucose, Bld: 256 mg/dL — ABNORMAL HIGH (ref 70–99)
HCT: 28 % — ABNORMAL LOW (ref 39.0–52.0)
Hemoglobin: 9.5 g/dL — ABNORMAL LOW (ref 13.0–17.0)
Potassium: 4.1 mmol/L (ref 3.5–5.1)
Sodium: 136 mmol/L (ref 135–145)
TCO2: 32 mmol/L (ref 22–32)

## 2024-01-11 LAB — GLUCOSE, CAPILLARY: Glucose-Capillary: 254 mg/dL — ABNORMAL HIGH (ref 70–99)

## 2024-01-11 SURGERY — A/V FISTULAGRAM
Anesthesia: LOCAL | Laterality: Right

## 2024-01-11 MED ORDER — HEPARIN (PORCINE) IN NACL 2000-0.9 UNIT/L-% IV SOLN
INTRAVENOUS | Status: DC | PRN
  Administered 2024-01-11: 1000 mL

## 2024-01-11 MED ORDER — LIDOCAINE HCL (PF) 1 % IJ SOLN
INTRAMUSCULAR | Status: AC
  Filled 2024-01-11: qty 30

## 2024-01-11 MED ORDER — SODIUM CHLORIDE 0.9% FLUSH: 3.0000 mL | INTRAVENOUS | Status: DC | PRN

## 2024-01-11 MED ORDER — IODIXANOL 320 MG/ML IV SOLN
INTRAVENOUS | Status: DC | PRN
  Administered 2024-01-11: 10 mL

## 2024-01-11 MED ORDER — LIDOCAINE HCL (PF) 1 % IJ SOLN
INTRAMUSCULAR | Status: DC | PRN
  Administered 2024-01-11: 2 mL

## 2024-01-11 SURGICAL SUPPLY — 4 items
COVER DOME SNAP 22 D (MISCELLANEOUS) ×1 IMPLANT
KIT MICROPUNCTURE NIT STIFF (SHEATH) IMPLANT
TRAY PV CATH (CUSTOM PROCEDURE TRAY) ×1 IMPLANT
TUBING CIL FLEX 10 FLL-RA (TUBING) ×1 IMPLANT

## 2024-01-11 NOTE — Op Note (Signed)
 [facility] [place]  ASSESSMENT / PLAN: 48 y.o. male with poorly maturing AV access. Plan fistulagram today in cath lab.  CHIEF COMPLAINT: malfunctioning AV access  HISTORY OF PRESENT ILLNESS: Adam Davis is a 48 y.o. male who underwent right brachiocephalic arteriovenous fistula 09/07/23. This has been slow to mature, and has not been used. Fistulagram was requested by his dialysis team.   Past Medical History:  Diagnosis Date   Diabetes mellitus    H/O acute post-streptococcal glomerulonephritis 1997   History of renal transplant    Hyperlipidemia    Hypertension    Onychomycosis of toenail    Renal disorder    Tachycardia     Past Surgical History:  Procedure Laterality Date   ANKLE SURGERY     AV FISTULA PLACEMENT  1997   Removed after transplant in 2006   AV FISTULA PLACEMENT Right 09/07/2023   Procedure: RIGHT BRACHIOCEPHALIC FISTULA;  Surgeon: Kayla Part, MD;  Location: [facility];  Service: Vascular;  Laterality: Right;  Block in the Right arm   INSERTION OF DIALYSIS CATHETER Right 08/06/2023   Procedure: INSERTION OF TUNNELED DIALYSIS CATHETER, RIGHT INTERNAL JUGULAR;  Surgeon: Kayla Part, MD;  Location: [facility] OR;  Service: Vascular;  Laterality: Right;   IR FLUORO GUIDE CV LINE RIGHT  08/01/2023   IR US  GUIDE VASC ACCESS RIGHT  08/01/2023   KIDNEY SURGERY  2006   KIDNEY TRANSPLANT      Family History  Problem Relation Age of Onset   Diabetes Brother    Diabetes Maternal Grandmother    Hypertension Maternal Grandmother     Social History   Socioeconomic History   Marital status: Married    Spouse name: Not on file   Number of children: Not on file   Years of education: Not on file   Highest education level: Not on file  Occupational History   Not on file  Tobacco Use   Smoking status: Never    Passive exposure: Never   Smokeless tobacco: Never  Vaping Use   Vaping status: Never Used  Substance and Sexual Activity    Alcohol use: No   Drug use: No   Sexual activity: Not Currently  Other Topics Concern   Not on file  Social History Narrative   Not on file   Social Drivers of Health   Financial Resource Strain: Patient Declined (11/02/2023)   Received from [facility]   Overall Financial Resource Strain (CARDIA)    Difficulty of Paying Living Expenses: Patient declined  Food Insecurity: Patient Declined (11/02/2023)   Received from [facility]   Hunger Vital Sign    Worried About Running Out of Food in the Last Year: Patient declined    Ran Out of Food in the Last Year: Patient declined  Transportation Needs: No Transportation Needs (08/09/2023)   PRAPARE - Administrator, Civil Service (Medical): No    Lack of Transportation (Non-Medical): No  Physical Activity: Not on file  Stress: Not on file  Social Connections: Not on file  Intimate Partner Violence: Not At Risk (08/09/2023)   Humiliation, Afraid, Rape, and Kick questionnaire    Fear of Current or Ex-Partner: No    Emotionally Abused: No    Physically Abused: No    Sexually Abused: No    Allergies  Allergen Reactions   Atorvastatin  Other (See Comments)    Myalgia - resolved with discontinuation    Current Facility-Administered  Medications  Medication Dose Route Frequency Provider Last Rate Last Admin   sodium chloride  flush (NS) 0.9 % injection 3 mL  3 mL Intravenous PRN Robins, Joshua E, MD        PHYSICAL EXAM Vitals:   01/11/24 0625  BP: (!) 168/109  Pulse: 99  Resp: (!) 22  Temp: 98.6 F (37 C)  TempSrc: Oral  SpO2: 96%  Weight: 62.6 kg  Height: 5\' 6"  (1.676 m)   Chronically ill man in no distress Regular rate and rhythm Unlabored breathing Right brachiocephalic fistula small with thrill.    PERTINENT LABORATORY AND RADIOLOGIC DATA  Most recent CBC    Latest Ref Rng & Units 01/11/2024    6:47 AM 12/19/2023    6:00 AM 09/07/2023    9:19 AM  CBC  Hemoglobin 13.0  - 17.0 g/dL 9.5  16.1  09.6   Hematocrit 39.0 - 52.0 % 28.0  38.0  35.0      Most recent CMP    Latest Ref Rng & Units 01/11/2024    6:47 AM 12/19/2023    6:00 AM 09/07/2023    9:19 AM  CMP  Glucose 70 - 99 mg/dL 045  409  811   BUN 6 - 20 mg/dL 20  27  31    Creatinine 0.61 - 1.24 mg/dL 9.14  7.82  9.56   Sodium 135 - 145 mmol/L 136  137  136   Potassium 3.5 - 5.1 mmol/L 4.1  3.7  4.3   Chloride 98 - 111 mmol/L 94  94  99     Renal function Estimated Creatinine Clearance: 14 mL/min (A) (by C-G formula based on SCr of 5.7 mg/dL (H)).  HbA1c, POC (controlled diabetic range) (%)  Date Value  02/01/2023 7.6 (A)   Hgb A1c MFr Bld (%)  Date Value  08/01/2023 6.8 (H)    LDL Chol Calc (NIH)  Date Value Ref Range Status  11/07/2022 156 (H) 0 - 99 mg/dL Final     Heber Little. Edgardo Goodwill, MD FACS [facility] Phone Number: [phone] 01/11/2024 7:21 AM   Total time spent on preparing this encounter including chart review, data review, collecting history, examining the patient, and coordinating care: 40 minutes.   Portions of this report may have been transcribed using voice recognition software.  Every effort has been made to ensure accuracy; however, inadvertent computerized transcription errors may still be present.

## 2024-01-11 NOTE — Discharge Instructions (Signed)
 Activity Rest as told by your health care provider. Return to your normal activities as told by your health care provider. Ask your health care provider what activities are safe for you. May shower and remove bandage 24 hours after discharge. If you were given a sedative during the procedure, it can affect you for several hours. Do not drive or operate machinery until your health care provider says that it is safe. General instructions      Check your site day for signs of infection. Check for: Redness, swelling, or pain. Fluid or blood. Warmth. Pus or a bad smell. Take over-the-counter and prescription medicines only as told by your health care provider. Contact a health care provider if: You have redness, swelling, warmth, pus, or pain around the site.

## 2024-01-11 NOTE — Progress Notes (Signed)
 Notified [facility] of patient eating oatmeal at 0500. [facility] will notify doctor.

## 2024-01-11 NOTE — Op Note (Signed)
 DATE OF SERVICE: 01/11/2024  PATIENT:  Adam Davis  48 y.o. male  PRE-OPERATIVE DIAGNOSIS:   ESRD Poorly maturing AV fistula   POST-OPERATIVE DIAGNOSIS:  Same  PROCEDURE:   Right arm fistulagram  SURGEON:  Surgeons and Role:    * Carlene Che, MD - Primary  ASSISTANT: none  ANESTHESIA:   local  EBL: minimal  BLOOD ADMINISTERED:none  DRAINS: none   LOCAL MEDICATIONS USED:  LIDOCAINE    SPECIMEN:  none  COUNTS: confirmed correct.  TOURNIQUET:  none  PATIENT DISPOSITION:  PACU - hemodynamically stable.   Delay start of Pharmacological VTE agent (>24hrs) due to surgical blood loss or risk of bleeding: no  INDICATION FOR PROCEDURE: Adam Davis is a 48 y.o. male with right arm brachiocephalic arteriovenous fistula that has not matured. After careful discussion of risks, benefits, and alternatives the patient was offered fistulagram. The patient understood and wished to proceed.  OPERATIVE FINDINGS:  No central venous stenosis No right subclavian vein stenosis RIJ TDC in place without stenosis about catheter No right axillary vein stenosis No cephalic vein fistula stenosis No arteriovenous anastomotic stenosis  DESCRIPTION OF PROCEDURE: After identification of the patient in the pre-operative holding area, the patient was transferred to the operating room. The patient was positioned supine on the operating room table. Anesthesia was induced. The right arm was prepped and draped in standard fashion. A surgical pause was performed confirming correct patient, procedure, and operative location.  Micropuncture access was obtained from the fistula.  A micro sheath was introduced into the fistula with Seldinger technique.  Fistulogram was performed in stations.  See above for details.  No stenosis was seen and no opportunity for intervention found.  All the vascular equipment was removed and a figure-of-eight stitch applied to the access site with good  hemostasis.  Upon completion of the case instrument and sharps counts were confirmed correct. The patient was transferred to the PACU in good condition. I was present for all portions of the procedure.  FOLLOW UP PLAN: follow up to discuss new access creation  [employer]. Edgardo Goodwill, MD [facility] Phone Number: [phone] 01/11/2024 8:08 AM

## 2024-01-12 DIAGNOSIS — Z992 Dependence on renal dialysis: Secondary | ICD-10-CM | POA: Diagnosis not present

## 2024-01-12 DIAGNOSIS — N186 End stage renal disease: Secondary | ICD-10-CM | POA: Diagnosis not present

## 2024-01-12 DIAGNOSIS — T8249XA Other complication of vascular dialysis catheter, initial encounter: Secondary | ICD-10-CM | POA: Diagnosis not present

## 2024-01-12 DIAGNOSIS — N2581 Secondary hyperparathyroidism of renal origin: Secondary | ICD-10-CM | POA: Diagnosis not present

## 2024-01-13 ENCOUNTER — Encounter (HOSPITAL_COMMUNITY): Payer: Self-pay | Admitting: Vascular Surgery

## 2024-01-13 NOTE — H&P (Signed)
 [facility] [place]   ASSESSMENT / PLAN: 48 y.o. male with poorly maturing AV access. Plan fistulagram today in cath lab.   CHIEF COMPLAINT: malfunctioning AV access   HISTORY OF PRESENT ILLNESS: Adam Davis is a 48 y.o. male who underwent right brachiocephalic arteriovenous fistula 09/07/23. This has been slow to mature, and has not been used. Fistulagram was requested by his dialysis team.        Past Medical History:  Diagnosis Date   Diabetes mellitus     H/O acute post-streptococcal glomerulonephritis 1997   History of renal transplant     Hyperlipidemia     Hypertension     Onychomycosis of toenail     Renal disorder     Tachycardia    [] Expand by Default             Past Surgical History:  Procedure Laterality Date   ANKLE SURGERY       AV FISTULA PLACEMENT   1997    Removed after transplant in 2006   AV FISTULA PLACEMENT Right 09/07/2023    Procedure: RIGHT BRACHIOCEPHALIC FISTULA;  Surgeon: Kayla Part, MD;  Location: [facility] OR;  Service: Vascular;  Laterality: Right;  Block in the Right arm   INSERTION OF DIALYSIS CATHETER Right 08/06/2023    Procedure: INSERTION OF TUNNELED DIALYSIS CATHETER, RIGHT INTERNAL JUGULAR;  Surgeon: Kayla Part, MD;  Location: [facility] OR;  Service: Vascular;  Laterality: Right;   IR FLUORO GUIDE CV LINE RIGHT   08/01/2023   IR US  GUIDE VASC ACCESS RIGHT   08/01/2023   KIDNEY SURGERY   2006   KIDNEY TRANSPLANT                   Family History  Problem Relation Age of Onset   Diabetes Brother     Diabetes Maternal Grandmother     Hypertension Maternal Grandmother            Social History         Socioeconomic History   Marital status: Married      Spouse name: Not on file   Number of children: Not on file   Years of education: Not on file   Highest education level: Not on file  Occupational History   Not on file  Tobacco Use   Smoking status: Never      Passive exposure: Never   Smokeless  tobacco: Never  Vaping Use   Vaping status: Never Used  Substance and Sexual Activity   Alcohol use: No   Drug use: No   Sexual activity: Not Currently  Other Topics Concern   Not on file  Social History Narrative   Not on file    Social Drivers of Health        Financial Resource Strain: Patient Declined (11/02/2023)    Received from [facility]    Overall Financial Resource Strain (CARDIA)     Difficulty of Paying Living Expenses: Patient declined  Food Insecurity: Patient Declined (11/02/2023)    Received from [facility]    Hunger Vital Sign     Worried About Running Out of Food in the Last Year: Patient declined     Ran Out of Food in the Last Year: Patient declined  Transportation Needs: No Transportation Needs (08/09/2023)    PRAPARE - Therapist, art (Medical): No     Lack of Transportation (Non-Medical): No  Physical Activity: Not on file  Stress: Not on file  Social Connections: Not on file  Intimate Partner Violence: Not At Risk (08/09/2023)    Humiliation, Afraid, Rape, and Kick questionnaire     Fear of Current or Ex-Partner: No     Emotionally Abused: No     Physically Abused: No     Sexually Abused: No      Allergies       Allergies  Allergen Reactions   Atorvastatin  Other (See Comments)      Myalgia - resolved with discontinuation                 Current Facility-Administered Medications  Medication Dose Route Frequency Provider Last Rate Last Admin   sodium chloride  flush (NS) 0.9 % injection 3 mL  3 mL Intravenous PRN Robins, Joshua E, MD            PHYSICAL EXAM    Vitals:    01/11/24 0625  BP: (!) 168/109  Pulse: 99  Resp: (!) 22  Temp: 98.6 F (37 C)  TempSrc: Oral  SpO2: 96%  Weight: 62.6 kg  Height: 5\' 6"  (1.676 m)    Chronically ill man in no distress Regular rate and rhythm Unlabored breathing Right brachiocephalic fistula small with thrill.      PERTINENT  LABORATORY AND RADIOLOGIC DATA   Most recent CBC     Latest Ref Rng & Units 01/11/2024    6:47 AM 12/19/2023    6:00 AM 09/07/2023    9:19 AM  CBC  Hemoglobin 13.0 - 17.0 g/dL 9.5  16.1  09.6   Hematocrit 39.0 - 52.0 % 28.0  38.0  35.0       Most recent CMP     Latest Ref Rng & Units 01/11/2024    6:47 AM 12/19/2023    6:00 AM 09/07/2023    9:19 AM  CMP  Glucose 70 - 99 mg/dL 045  409  811   BUN 6 - 20 mg/dL 20  27  31    Creatinine 0.61 - 1.24 mg/dL 9.14  7.82  9.56   Sodium 135 - 145 mmol/L 136  137  136   Potassium 3.5 - 5.1 mmol/L 4.1  3.7  4.3   Chloride 98 - 111 mmol/L 94  94  99       Renal function Estimated Creatinine Clearance: 14 mL/min (A) (by C-G formula based on SCr of 5.7 mg/dL (H)).   Last Labs     HbA1c, POC (controlled diabetic range) (%)  Date Value  02/01/2023 7.6 (A)       Hgb A1c MFr Bld (%)  Date Value  08/01/2023 6.8 (H)        Last Labs       LDL Chol Calc (NIH)  Date Value Ref Range Status  11/07/2022 156 (H) 0 - 99 mg/dL Final        Adam Little. Edgardo Goodwill, MD FACS [facility] Phone Number: [phone] 01/11/2024 7:21 AM     Total time spent on preparing this encounter including chart review, data review, collecting history, examining the patient, and coordinating care: 40 minutes.    Portions of this report may have been transcribed using voice recognition software.  Every effort has been made to ensure accuracy; however, inadvertent computerized transcription errors may still be present.

## 2024-01-15 DIAGNOSIS — N2581 Secondary hyperparathyroidism of renal origin: Secondary | ICD-10-CM | POA: Diagnosis not present

## 2024-01-15 DIAGNOSIS — T8249XA Other complication of vascular dialysis catheter, initial encounter: Secondary | ICD-10-CM | POA: Diagnosis not present

## 2024-01-15 DIAGNOSIS — N186 End stage renal disease: Secondary | ICD-10-CM | POA: Diagnosis not present

## 2024-01-15 DIAGNOSIS — Z992 Dependence on renal dialysis: Secondary | ICD-10-CM | POA: Diagnosis not present

## 2024-01-17 ENCOUNTER — Telehealth: Payer: Self-pay

## 2024-01-17 ENCOUNTER — Other Ambulatory Visit (HOSPITAL_COMMUNITY): Payer: Self-pay

## 2024-01-17 DIAGNOSIS — N186 End stage renal disease: Secondary | ICD-10-CM | POA: Diagnosis not present

## 2024-01-17 DIAGNOSIS — T8249XA Other complication of vascular dialysis catheter, initial encounter: Secondary | ICD-10-CM | POA: Diagnosis not present

## 2024-01-17 DIAGNOSIS — Z992 Dependence on renal dialysis: Secondary | ICD-10-CM | POA: Diagnosis not present

## 2024-01-17 DIAGNOSIS — N2581 Secondary hyperparathyroidism of renal origin: Secondary | ICD-10-CM | POA: Diagnosis not present

## 2024-01-17 NOTE — Telephone Encounter (Signed)
 Pharmacy Patient Advocate Encounter   Received notification from Patient Pharmacy that prior authorization for FREESTYLE LIBRE 3 PLUS SENSORS is required/requested.   Insurance verification completed.   The patient is insured through [facility] .   Per test claim:  Yvonna Herder is preferred by the insurance.  If suggested medication is appropriate, Please send in a new RX and discontinue this one. If not, please advise as to why it's not appropriate so that we may request a Prior Authorization. Please note, some preferred medications may still require a PA.  If the suggested medications have not been trialed and there are no contraindications to their use, the PA will not be submitted, as it will not be approved.

## 2024-01-19 DIAGNOSIS — Z992 Dependence on renal dialysis: Secondary | ICD-10-CM | POA: Diagnosis not present

## 2024-01-19 DIAGNOSIS — T8249XA Other complication of vascular dialysis catheter, initial encounter: Secondary | ICD-10-CM | POA: Diagnosis not present

## 2024-01-19 DIAGNOSIS — N2581 Secondary hyperparathyroidism of renal origin: Secondary | ICD-10-CM | POA: Diagnosis not present

## 2024-01-19 DIAGNOSIS — N186 End stage renal disease: Secondary | ICD-10-CM | POA: Diagnosis not present

## 2024-01-22 DIAGNOSIS — Z992 Dependence on renal dialysis: Secondary | ICD-10-CM | POA: Diagnosis not present

## 2024-01-22 DIAGNOSIS — N2581 Secondary hyperparathyroidism of renal origin: Secondary | ICD-10-CM | POA: Diagnosis not present

## 2024-01-22 DIAGNOSIS — N186 End stage renal disease: Secondary | ICD-10-CM | POA: Diagnosis not present

## 2024-01-22 DIAGNOSIS — R52 Pain, unspecified: Secondary | ICD-10-CM | POA: Diagnosis not present

## 2024-01-22 DIAGNOSIS — T8249XA Other complication of vascular dialysis catheter, initial encounter: Secondary | ICD-10-CM | POA: Diagnosis not present

## 2024-01-23 ENCOUNTER — Ambulatory Visit: Admitting: Pharmacist

## 2024-01-23 ENCOUNTER — Encounter: Payer: Self-pay | Admitting: Pharmacist

## 2024-01-23 VITALS — BP 164/91 | HR 89 | Wt 133.0 lb

## 2024-01-23 DIAGNOSIS — Z794 Long term (current) use of insulin: Secondary | ICD-10-CM | POA: Diagnosis not present

## 2024-01-23 DIAGNOSIS — I10 Essential (primary) hypertension: Secondary | ICD-10-CM | POA: Diagnosis not present

## 2024-01-23 DIAGNOSIS — E1165 Type 2 diabetes mellitus with hyperglycemia: Secondary | ICD-10-CM

## 2024-01-23 DIAGNOSIS — E785 Hyperlipidemia, unspecified: Secondary | ICD-10-CM

## 2024-01-23 MED ORDER — INSULIN GLARGINE-YFGN 100 UNIT/ML ~~LOC~~ SOPN: 10.0000 [IU] | PEN_INJECTOR | Freq: Every day | SUBCUTANEOUS | 1 refills | Status: DC

## 2024-01-23 MED ORDER — CARVEDILOL 25 MG PO TABS
25.0000 mg | ORAL_TABLET | Freq: Two times a day (BID) | ORAL | 3 refills | Status: DC
Start: 2024-01-23 — End: 2024-01-27

## 2024-01-23 MED ORDER — DEXCOM G7 SENSOR MISC: 11 refills | Status: AC

## 2024-01-23 NOTE — Assessment & Plan Note (Signed)
 ASCVD risk - primary prevention in patient with diabetes. Last LDL of 156 is not at goal of <70 mg/dL. ASCVD risk factors include ESRD, HTN, T2DM, HLD, and 10-year ASCVD risk score of 23.3%. High intensity statin indicated. Will continue rosuvastatin  10 mg on M/W/F. Should consider dose increase at next visit.

## 2024-01-23 NOTE — Assessment & Plan Note (Signed)
 Diabetes longstanding since 2013 currently uncontrolled but improved with GMI of 9.1% Patient is able to verbalize appropriate hypoglycemia management plan. Medication adherence appears good. Control is suboptimal due to inadequate insulin  dosage. Increase Semglee  (insulin  glargine)from 8 to 10 units daily. Provided 2 Dexcom samples + reader, and sent new Rx to pharmacy.

## 2024-01-23 NOTE — Patient Instructions (Signed)
 It was nice to see you today!  Your goal blood sugar is 80-130 before eating and less than 180 after eating.  Medication Changes: STOP taking metoprolol  when you run out. Once you are out, START taking carvediolol 25 mg twice daily.   INCREASE your Semglee  (insulin  glargine) to 10 units daily.   When you are out of sensors your Dexcom will be available for pickup from [facility] pharmacy.   Continue all other medication the same.   Monitor blood sugars at home and keep a log (glucometer or piece of paper) to bring with you to your next visit.  Keep up the good work with diet and exercise. Aim for a diet full of vegetables, fruit and lean meats (chicken, [place], fish). Try to limit salt intake by eating fresh or frozen vegetables (instead of canned), rinse canned vegetables prior to cooking and do not add any additional salt to meals.

## 2024-01-23 NOTE — Assessment & Plan Note (Signed)
 Hypertension longstanding since 2014 currently uncontrolled. Today's blood pressure of 164/91 above goal of <130/80 mmHg despite taking BP medication last night. Medication adherence good. Blood pressure control is suboptimal due to use of less effective agent for BP lowering. Discontinue metoprolol  100 mg daily and initiate carvedilol 25 mg BID in attempt to improve BP. Continue amlodipine  10 mg and valsartan  320 mg daily.

## 2024-01-23 NOTE — Progress Notes (Signed)
 S:     Chief Complaint  Patient presents with   Medication Management    Dexcom set-up   48 y.o. male who presents for diabetes evaluation, education, and management. Patient arrives in without assistance, but reports a lot of stress. Patient is also recovering from recent pneumonia infection.   Patient was referred and last seen by Primary Care Provider, Dr. Derril Flint, on 01/02/24. At last pharmacy visit, patient was given [facility] 3 FreeStyle sensors. Shortly after we discovered Dexom was preferred by his insurance, and informed him that we would be switching at this visit.   PMH is significant for kidney transplant, ESRD, HTN, HLD.  Patient reports Diabetes was diagnosed in 2013.   Current diabetes medications include: Semglee  (insulin  glargine) 8 units daily Current hypertension medications include: Amlodipine  10 mg, valsartan  320 mg, metoprolol  succinate 100 mg daily.  Current hyperlipidemia medications include: rosuvastatin  10 mg on M/W/F.   Patient reports adherence to taking all medications as prescribed.   Do you have any problems obtaining medications due to transportation or finances? Yes, had difficulty with high copay of FreeStyle sensors.  Insurance coverage: BCBS + Medicaid  Patient denies hypoglycemic events.  O:   Review of Systems  Musculoskeletal:  Positive for back pain and joint pain (Hip).  All other systems reviewed and are negative.   Physical Exam Constitutional:      Appearance: Normal appearance.  Neurological:     Mental Status: He is alert.  Psychiatric:        Mood and Affect: Mood normal.        Behavior: Behavior normal.        Thought Content: Thought content normal.        Judgment: Judgment normal.    Libre3 CGM Download today 5/15 - 01/23/24  % Time CGM is active: 79% Average Glucose: 240 mg/dL Glucose Management Indicator: 9.1% Glucose Variability: 26.8% (goal <36%) Time in Goal:  - Time in range 70-180: 16% - Time above range: 84%  (38% very high) - Time below range: 0% Observed patterns: Usually highest in early evening, but limited peaks and troughs.   Lab Results  Component Value Date   HGBA1C 6.8 (H) 08/01/2023   Vitals:   01/23/24 1108 01/23/24 1112  BP: (!) 164/93 (!) 164/91  Pulse: 89   SpO2: 99%     Lipid Panel     Component Value Date/Time   CHOL 226 (H) 11/07/2022 1700   TRIG 116 11/07/2022 1700   HDL 49 11/07/2022 1700   CHOLHDL 4.6 11/07/2022 1700   CHOLHDL 6.4 07/04/2012 0957   VLDL 55 (H) 07/04/2012 0957   LDLCALC 156 (H) 11/07/2022 1700    Clinical Atherosclerotic Cardiovascular Disease (ASCVD): No  The 10-year ASCVD risk score (Arnett DK, et al., 2019) is: 23.3%   Values used to calculate the score:     Age: 56 years     Sex: Male     Is Non-Hispanic African American: Yes     Diabetic: Yes     Tobacco smoker: No     Systolic Blood Pressure: 164 mmHg     Is BP treated: Yes     HDL Cholesterol: 49 mg/dL     Total Cholesterol: 226 mg/dL   Patient is participating in a Managed Medicaid Plan:  Yes   A/P: Diabetes longstanding since 2013 currently uncontrolled but improved with GMI of 9.1% Patient is able to verbalize appropriate hypoglycemia management plan. Medication adherence appears good. Control is suboptimal  due to inadequate insulin  dosage. Increase Semglee  (insulin  glargine)from 8 to 10 units daily. Provided 2 Dexcom samples + reader, and sent new Rx to pharmacy.   ASCVD risk - primary prevention in patient with diabetes. Last LDL of 156 is not at goal of <70 mg/dL. ASCVD risk factors include ESRD, HTN, T2DM, HLD, and 10-year ASCVD risk score of 23.3%. High intensity statin indicated. Will continue rosuvastatin  10 mg on M/W/F. Should consider dose increase at next visit.   Hypertension longstanding since 2014 currently uncontrolled. Today's blood pressure of 164/91 above goal of <130/80 mmHg despite taking BP medication last night. Medication adherence good. Blood pressure  control is suboptimal due to use of less effective agent for BP lowering. Discontinue metoprolol  100 mg daily and initiate carvedilol 25 mg BID in attempt to improve BP. Continue amlodipine  10 mg and valsartan  320 mg daily.   Written patient instructions provided. Patient verbalized understanding of treatment plan. Total time in face to face counseling 41 minutes.    Follow-up:  Pharmacist on 6/20.  PCP clinic visit with Dr. Derril Flint on 6/6.  Patient seen with Noah [place], PharmD Candidate.

## 2024-01-24 DIAGNOSIS — Z992 Dependence on renal dialysis: Secondary | ICD-10-CM | POA: Diagnosis not present

## 2024-01-24 DIAGNOSIS — N186 End stage renal disease: Secondary | ICD-10-CM | POA: Diagnosis not present

## 2024-01-24 DIAGNOSIS — T8249XA Other complication of vascular dialysis catheter, initial encounter: Secondary | ICD-10-CM | POA: Diagnosis not present

## 2024-01-24 DIAGNOSIS — R52 Pain, unspecified: Secondary | ICD-10-CM | POA: Diagnosis not present

## 2024-01-24 DIAGNOSIS — N2581 Secondary hyperparathyroidism of renal origin: Secondary | ICD-10-CM | POA: Diagnosis not present

## 2024-01-25 NOTE — Progress Notes (Signed)
 Reviewed and agree with Dr Macky Lower plan.

## 2024-01-26 DIAGNOSIS — T8249XA Other complication of vascular dialysis catheter, initial encounter: Secondary | ICD-10-CM | POA: Diagnosis not present

## 2024-01-26 DIAGNOSIS — N186 End stage renal disease: Secondary | ICD-10-CM | POA: Diagnosis not present

## 2024-01-26 DIAGNOSIS — N2581 Secondary hyperparathyroidism of renal origin: Secondary | ICD-10-CM | POA: Diagnosis not present

## 2024-01-26 DIAGNOSIS — T8612 Kidney transplant failure: Secondary | ICD-10-CM | POA: Diagnosis not present

## 2024-01-26 DIAGNOSIS — Z992 Dependence on renal dialysis: Secondary | ICD-10-CM | POA: Diagnosis not present

## 2024-01-26 DIAGNOSIS — R52 Pain, unspecified: Secondary | ICD-10-CM | POA: Diagnosis not present

## 2024-01-27 ENCOUNTER — Inpatient Hospital Stay (HOSPITAL_COMMUNITY)
Admission: EM | Admit: 2024-01-27 | Discharge: 2024-01-30 | DRG: 640 | Disposition: A | Discharge status: Home / Self Care | Attending: Family Medicine | Admitting: Family Medicine

## 2024-01-27 ENCOUNTER — Encounter (HOSPITAL_COMMUNITY): Payer: Self-pay

## 2024-01-27 ENCOUNTER — Other Ambulatory Visit: Payer: Self-pay

## 2024-01-27 ENCOUNTER — Emergency Department (HOSPITAL_COMMUNITY)

## 2024-01-27 DIAGNOSIS — D84821 Immunodeficiency due to drugs: Secondary | ICD-10-CM | POA: Diagnosis present

## 2024-01-27 DIAGNOSIS — Z992 Dependence on renal dialysis: Secondary | ICD-10-CM

## 2024-01-27 DIAGNOSIS — Z79621 Long term (current) use of calcineurin inhibitor: Secondary | ICD-10-CM | POA: Diagnosis not present

## 2024-01-27 DIAGNOSIS — T8612 Kidney transplant failure: Secondary | ICD-10-CM | POA: Diagnosis present

## 2024-01-27 DIAGNOSIS — R040 Epistaxis: Secondary | ICD-10-CM | POA: Diagnosis not present

## 2024-01-27 DIAGNOSIS — Z8249 Family history of ischemic heart disease and other diseases of the circulatory system: Secondary | ICD-10-CM | POA: Diagnosis not present

## 2024-01-27 DIAGNOSIS — E1165 Type 2 diabetes mellitus with hyperglycemia: Secondary | ICD-10-CM | POA: Diagnosis present

## 2024-01-27 DIAGNOSIS — D631 Anemia in chronic kidney disease: Secondary | ICD-10-CM | POA: Diagnosis present

## 2024-01-27 DIAGNOSIS — Y830 Surgical operation with transplant of whole organ as the cause of abnormal reaction of the patient, or of later complication, without mention of misadventure at the time of the procedure: Secondary | ICD-10-CM | POA: Diagnosis present

## 2024-01-27 DIAGNOSIS — Z833 Family history of diabetes mellitus: Secondary | ICD-10-CM | POA: Diagnosis not present

## 2024-01-27 DIAGNOSIS — J189 Pneumonia, unspecified organism: Principal | ICD-10-CM

## 2024-01-27 DIAGNOSIS — E8779 Other fluid overload: Secondary | ICD-10-CM | POA: Diagnosis not present

## 2024-01-27 DIAGNOSIS — Z794 Long term (current) use of insulin: Secondary | ICD-10-CM

## 2024-01-27 DIAGNOSIS — I5022 Chronic systolic (congestive) heart failure: Secondary | ICD-10-CM

## 2024-01-27 DIAGNOSIS — N186 End stage renal disease: Secondary | ICD-10-CM | POA: Diagnosis present

## 2024-01-27 DIAGNOSIS — M5430 Sciatica, unspecified side: Secondary | ICD-10-CM | POA: Diagnosis present

## 2024-01-27 DIAGNOSIS — J811 Chronic pulmonary edema: Secondary | ICD-10-CM | POA: Diagnosis present

## 2024-01-27 DIAGNOSIS — Z79899 Other long term (current) drug therapy: Secondary | ICD-10-CM

## 2024-01-27 DIAGNOSIS — E785 Hyperlipidemia, unspecified: Secondary | ICD-10-CM | POA: Diagnosis present

## 2024-01-27 DIAGNOSIS — E8770 Fluid overload, unspecified: Principal | ICD-10-CM

## 2024-01-27 DIAGNOSIS — E1122 Type 2 diabetes mellitus with diabetic chronic kidney disease: Secondary | ICD-10-CM | POA: Diagnosis present

## 2024-01-27 DIAGNOSIS — Z789 Other specified health status: Secondary | ICD-10-CM

## 2024-01-27 DIAGNOSIS — Z8701 Personal history of pneumonia (recurrent): Secondary | ICD-10-CM | POA: Diagnosis not present

## 2024-01-27 DIAGNOSIS — R0600 Dyspnea, unspecified: Secondary | ICD-10-CM | POA: Diagnosis present

## 2024-01-27 DIAGNOSIS — N2581 Secondary hyperparathyroidism of renal origin: Secondary | ICD-10-CM | POA: Diagnosis present

## 2024-01-27 DIAGNOSIS — I120 Hypertensive chronic kidney disease with stage 5 chronic kidney disease or end stage renal disease: Secondary | ICD-10-CM | POA: Diagnosis present

## 2024-01-27 DIAGNOSIS — J9601 Acute respiratory failure with hypoxia: Secondary | ICD-10-CM | POA: Diagnosis present

## 2024-01-27 DIAGNOSIS — Z79624 Long term (current) use of inhibitors of nucleotide synthesis: Secondary | ICD-10-CM

## 2024-01-27 DIAGNOSIS — R0602 Shortness of breath: Secondary | ICD-10-CM | POA: Diagnosis present

## 2024-01-27 HISTORY — DX: Dependence on renal dialysis: N18.6

## 2024-01-27 HISTORY — DX: End stage renal disease: N18.6

## 2024-01-27 LAB — CBC
HCT: 25 % — ABNORMAL LOW (ref 39.0–52.0)
Hemoglobin: 7.8 g/dL — ABNORMAL LOW (ref 13.0–17.0)
MCH: 21.3 pg — ABNORMAL LOW (ref 26.0–34.0)
MCHC: 31.2 g/dL (ref 30.0–36.0)
MCV: 68.1 fL — ABNORMAL LOW (ref 80.0–100.0)
Platelets: 228 10*3/uL (ref 150–400)
RBC: 3.67 MIL/uL — ABNORMAL LOW (ref 4.22–5.81)
RDW: 20.3 % — ABNORMAL HIGH (ref 11.5–15.5)
WBC: 8.6 10*3/uL (ref 4.0–10.5)
nRBC: 0 % (ref 0.0–0.2)

## 2024-01-27 LAB — GLUCOSE, CAPILLARY: Glucose-Capillary: 331 mg/dL — ABNORMAL HIGH (ref 70–99)

## 2024-01-27 LAB — BASIC METABOLIC PANEL WITH GFR
Anion gap: 15 (ref 5–15)
BUN: 37 mg/dL — ABNORMAL HIGH (ref 6–20)
CO2: 27 mmol/L (ref 22–32)
Calcium: 8.5 mg/dL — ABNORMAL LOW (ref 8.9–10.3)
Chloride: 93 mmol/L — ABNORMAL LOW (ref 98–111)
Creatinine, Ser: 6.33 mg/dL — ABNORMAL HIGH (ref 0.61–1.24)
GFR, Estimated: 10 mL/min — ABNORMAL LOW (ref >60)
Glucose, Bld: 402 mg/dL — ABNORMAL HIGH (ref 70–99)
Potassium: 3.8 mmol/L (ref 3.5–5.1)
Sodium: 135 mmol/L (ref 135–145)

## 2024-01-27 MED ORDER — VANCOMYCIN HCL 500 MG/100ML IV SOLN: 500.0000 mg | INTRAVENOUS | Status: DC

## 2024-01-27 MED ORDER — CHLORHEXIDINE GLUCONATE CLOTH 2 % EX PADS
6.0000 | MEDICATED_PAD | Freq: Every day | CUTANEOUS | Status: DC
  Administered 2024-01-28 – 2024-01-30 (×2): 6 via TOPICAL

## 2024-01-27 MED ORDER — ACETAMINOPHEN 325 MG PO TABS
650.0000 mg | ORAL_TABLET | Freq: Four times a day (QID) | ORAL | Status: DC | PRN
  Administered 2024-01-28 – 2024-01-30 (×3): 650 mg via ORAL
  Filled 2024-01-27 (×3): qty 2

## 2024-01-27 MED ORDER — MYCOPHENOLATE MOFETIL 250 MG PO CAPS
500.0000 mg | ORAL_CAPSULE | Freq: Two times a day (BID) | ORAL | Status: DC
  Administered 2024-01-27 – 2024-01-30 (×6): 500 mg via ORAL
  Filled 2024-01-27 (×6): qty 2

## 2024-01-27 MED ORDER — INSULIN ASPART 100 UNIT/ML IJ SOLN
0.0000 [IU] | Freq: Three times a day (TID) | INTRAMUSCULAR | Status: DC
  Administered 2024-01-28: 5 [IU] via SUBCUTANEOUS
  Administered 2024-01-28 – 2024-01-29 (×5): 4 [IU] via SUBCUTANEOUS
  Administered 2024-01-30: 2 [IU] via SUBCUTANEOUS

## 2024-01-27 MED ORDER — DOXYCYCLINE HYCLATE 100 MG PO TABS: 100.0000 mg | ORAL_TABLET | Freq: Two times a day (BID) | ORAL | Status: DC

## 2024-01-27 MED ORDER — TACROLIMUS 1 MG PO CAPS
1.0000 mg | ORAL_CAPSULE | Freq: Two times a day (BID) | ORAL | Status: DC
  Administered 2024-01-27 – 2024-01-30 (×6): 1 mg via ORAL
  Filled 2024-01-27 (×6): qty 1

## 2024-01-27 MED ORDER — IPRATROPIUM-ALBUTEROL 0.5-2.5 (3) MG/3ML IN SOLN
3.0000 mL | Freq: Once | RESPIRATORY_TRACT | Status: AC
  Administered 2024-01-27: 3 mL via RESPIRATORY_TRACT
  Filled 2024-01-27: qty 3

## 2024-01-27 MED ORDER — CARVEDILOL 25 MG PO TABS
25.0000 mg | ORAL_TABLET | Freq: Two times a day (BID) | ORAL | Status: DC
  Administered 2024-01-28 – 2024-01-30 (×5): 25 mg via ORAL
  Filled 2024-01-27 (×5): qty 1

## 2024-01-27 MED ORDER — ROSUVASTATIN CALCIUM 5 MG PO TABS
10.0000 mg | ORAL_TABLET | Freq: Every day | ORAL | Status: DC
  Administered 2024-01-28 – 2024-01-30 (×3): 10 mg via ORAL
  Filled 2024-01-27 (×3): qty 2

## 2024-01-27 MED ORDER — SODIUM CHLORIDE 0.9 % IV SOLN: 1.0000 g | INTRAVENOUS | Status: DC

## 2024-01-27 MED ORDER — DOXYCYCLINE HYCLATE 100 MG PO TABS
100.0000 mg | ORAL_TABLET | Freq: Once | ORAL | Status: AC
  Administered 2024-01-27: 100 mg via ORAL
  Filled 2024-01-27: qty 1

## 2024-01-27 MED ORDER — HEPARIN SODIUM (PORCINE) 5000 UNIT/ML IJ SOLN
5000.0000 [IU] | Freq: Three times a day (TID) | INTRAMUSCULAR | Status: DC
  Administered 2024-01-27 – 2024-01-29 (×5): 5000 [IU] via SUBCUTANEOUS
  Filled 2024-01-27 (×8): qty 1

## 2024-01-27 MED ORDER — SODIUM CHLORIDE 0.9 % IV SOLN
1.0000 g | Freq: Once | INTRAVENOUS | Status: AC
  Administered 2024-01-27: 1 g via INTRAVENOUS
  Filled 2024-01-27: qty 10

## 2024-01-27 MED ORDER — METOPROLOL SUCCINATE ER 25 MG PO TB24: 50.0000 mg | ORAL_TABLET | Freq: Every day | ORAL | Status: DC

## 2024-01-27 MED ORDER — INSULIN ASPART 100 UNIT/ML IJ SOLN
4.0000 [IU] | Freq: Once | INTRAMUSCULAR | Status: AC
  Administered 2024-01-27: 4 [IU] via SUBCUTANEOUS

## 2024-01-27 MED ORDER — VANCOMYCIN HCL 1.25 G IV SOLR
1250.0000 mg | Freq: Once | INTRAVENOUS | Status: AC
  Administered 2024-01-27: 1250 mg via INTRAVENOUS
  Filled 2024-01-27: qty 25

## 2024-01-27 MED ORDER — ACETAMINOPHEN 650 MG RE SUPP: 650.0000 mg | Freq: Four times a day (QID) | RECTAL | Status: DC | PRN

## 2024-01-27 MED ORDER — AMLODIPINE BESYLATE 10 MG PO TABS
10.0000 mg | ORAL_TABLET | Freq: Every day | ORAL | Status: DC
  Administered 2024-01-27 – 2024-01-30 (×4): 10 mg via ORAL
  Filled 2024-01-27: qty 1
  Filled 2024-01-27 (×2): qty 2
  Filled 2024-01-27: qty 1

## 2024-01-27 MED ORDER — DOXYCYCLINE HYCLATE 100 MG PO TABS
100.0000 mg | ORAL_TABLET | Freq: Two times a day (BID) | ORAL | Status: DC
  Administered 2024-01-28 – 2024-01-30 (×5): 100 mg via ORAL
  Filled 2024-01-27 (×5): qty 1

## 2024-01-27 MED ORDER — IRBESARTAN 300 MG PO TABS
300.0000 mg | ORAL_TABLET | Freq: Every day | ORAL | Status: DC
  Administered 2024-01-28 – 2024-01-30 (×3): 300 mg via ORAL
  Filled 2024-01-27 (×4): qty 1

## 2024-01-27 NOTE — Consult Note (Signed)
 Renal Service Consult Note [facility]  Adam Davis 01/27/2024 Lynae Sandifer, MD Requesting Physician: Dr. Abner Hoffman  Reason for Consult: ESRD patient with shortness of breath HPI: The patient is a 48 y.o. year-old w/ PMH as below who presented to ED this afternoon reporting shortness of breath over the past few days.  He was treated for pneumonia last month and it improved.  He does dialysis TTS, last dialysis was yesterday, has not missed any dialysis.  Denies any fevers chills or sweats, does have a cough which is nonproductive.  In the ED blood pressure 157/96, HR 90, RR 19-28, temp 99.7, 98% on room air.  Chest x-ray showed bilateral airspace disease, pulmonary edema or atypical infection.  BUN 37, creatinine 6.3, K+ 3.8, WBC 8K, Hgb 7.8.  Patient received DuoNeb and was ordered to get Rocephin IV and doxycycline p.o.  Patient is to be admitted.  We are asked to see for dialysis.    Pt seen in ED room. Pt c/o SOB. Doesn't want HD tonight, in the morning is okay. Says he can't tolerate 3 L UF, he will "cramp" severely. Denies any fevers, prod cough. Mild LE swelling.    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 Hyperlipidemia HTN ESRD on hemodialysis  Past Surgical History  Past Surgical History:  Procedure Laterality Date   A/V FISTULAGRAM Right 01/11/2024   Procedure: A/V Fistulagram;  Surgeon: Carlene Che, MD;  Location: [facility] INVASIVE CV LAB;  Service: Cardiovascular;  Laterality: Right;   ANKLE SURGERY     AV FISTULA PLACEMENT  1997   Removed after transplant in 2006   AV FISTULA PLACEMENT Right 09/07/2023   Procedure: RIGHT BRACHIOCEPHALIC FISTULA;  Surgeon: Kayla Part, MD;  Location: [facility] OR;  Service: Vascular;  Laterality: Right;  Block in the Right arm   INSERTION OF DIALYSIS CATHETER Right 08/06/2023    Procedure: INSERTION OF TUNNELED DIALYSIS CATHETER, RIGHT INTERNAL JUGULAR;  Surgeon: Kayla Part, MD;  Location: [facility] OR;  Service: Vascular;  Laterality: Right;   IR FLUORO GUIDE CV Davis RIGHT  08/01/2023   IR US  GUIDE VASC ACCESS RIGHT  08/01/2023   KIDNEY SURGERY  2006   KIDNEY TRANSPLANT     Family History  Family History  Problem Relation Age of Onset   Diabetes Brother    Diabetes Maternal Grandmother    Hypertension Maternal Grandmother    Social History  reports that he has never smoked. He has never been exposed to tobacco smoke. He has never used smokeless tobacco. He reports that he does not drink alcohol and does not use drugs. Allergies  Allergies  Allergen Reactions   Atorvastatin  Other (See Comments)    Myalgia - resolved with discontinuation   Home medications Prior to Admission medications   Medication Sig Start Date End Date Taking? Authorizing Provider  amLODipine  (NORVASC ) 10 MG tablet Take 10 mg by mouth daily.    [provider]  Blood Glucose Monitoring Suppl (BLOOD GLUCOSE MONITOR SYSTEM) w/Device KIT Use in the morning, at noon, and at bedtime. Patient not taking: Reported on 12/17/2023 08/07/23   [name], MD  calcitRIOL  (ROCALTROL ) 0.25 MCG capsule Take 1 capsule (0.25 mcg total) by mouth daily. Patient not taking: Reported on 01/23/2024 08/07/23   [name], MD  carvedilol  (COREG ) 25 MG tablet Take  1 tablet (25 mg total) by mouth 2 (two) times daily with a meal. 01/23/24   McDiarmid, [name], MD  Cinacalcet  HCl (SENSIPAR  PO) Take 30 mg by mouth every dialysis. 11/13/23 11/08/24  [provider]  Continuous Glucose Receiver (FREESTYLE LIBRE 3 READER) DEVI 1 DEVICE BY DOES NOT APPLY ROUTE ONCE FOR 1 DOSE    [provider]  Continuous Glucose Sensor (DEXCOM G7 SENSOR) MISC Use as directed 01/23/24   McDiarmid, [name], MD  cyclobenzaprine  (FLEXERIL ) 5 MG tablet Take 1 tablet (5 mg total) by mouth 3 (three) times daily as  needed for muscle spasms. Patient not taking: Reported on 01/23/2024 12/24/23   [name], MD  Darbepoetin Alfa  (ARANESP ) 100 MCG/0.5ML SOSY injection Inject 0.5 mLs (100 mcg total) into the skin every Wednesday at 6 PM. 08/08/23   [name], MD  doxercalciferol (HECTOROL) 4 MCG/2ML injection 3 mcg. 10/16/23 10/14/24  [provider]  insulin  glargine-yfgn (SEMGLEE ) 100 UNIT/ML Pen Inject 10 Units into the skin daily. 01/23/24   McDiarmid, [name], MD  Insulin  Pen Needle 32G X 4 MM MISC Use with Semglee  pen 08/07/23     Methoxy PEG-Epoetin Beta (MIRCERA IJ) Inject 150 Doses as directed daily as needed. 09/06/23 09/04/24  [provider]  multivitamin (RENA-VIT) TABS tablet Take 1 tablet by mouth daily. 08/06/23   [name], MD  mycophenolate  (CELLCEPT ) 250 MG capsule Take 1 capsule (250 mg total) by mouth 2 (two) times daily. 08/06/23   [name], MD  rosuvastatin  (CRESTOR ) 10 MG tablet Take one tablet every Monday, Wednesday, and Friday. 01/16/23   [name], [name], DO  sevelamer  carbonate (RENVELA ) 800 MG tablet Take 1 tablet (800 mg total) by mouth 3 (three) times daily with meals. 08/07/23   [name], MD  tacrolimus  (PROGRAF ) 1 MG capsule Take 1 mg by mouth 2 (two) times daily.    [provider]  valsartan  (DIOVAN ) 320 MG tablet Take 1 tablet (320 mg total) by mouth every evening. 12/24/23   [name], MD  atorvastatin  (LIPITOR) 40 MG tablet Take 1 tablet (40 mg total) by mouth daily. Patient not taking: Reported on 07/09/2017 07/13/16 05/09/19  [name], MD     Vitals:   01/27/24 1530 01/27/24 1545 01/27/24 1551 01/27/24 1600  BP: (!) 146/88 (!) 148/88  (!) 146/93  Pulse: 87 89 90 91  Resp: (!) 45 (!) 40 19   Temp:      TempSrc:      SpO2: 100% 97% 96% 98%  Weight:      Height:       Exam Gen alert, no distress, 3L  O2 No rash, cyanosis or gangrene Sclera anicteric, throat clear  Mild JVD Chest bibasilar crackles,  ^wob mild RRR no MRG Abd soft ntnd no mass or ascites +bs GU defer MS no joint effusions or deformity Ext 1+ pretib edema, no other edema Neuro is alert, Ox 3 , nf         Renal-related home meds: Norvasc  10 daily Rocaltrol  0.25 mcg daily Coreg  25 twice daily Sensipar  30 mg TTS Rena-Vite once daily CellCept  250 mg twice daily Renvela  1 AC 3 times daily Prograf  1 mg twice daily Valsartan  320 mg nightly Others: Statin, insulin , Flexeril     OP HD: [facility] TTS 4h   B400   57.5kg   TDC   Heparin  none Last OP HD 5/31, post wt 57.8 UF is usually 2-2.5 L max, gets to dry wt  Mircera  225 mcg q 2 wks, last 5/29    Assessment/ Plan: SOB: for last few days, CXR and exam suggest vol overload/ pulm edema. Not in distress. Plan is for extra HD 1st shift in am.  ESRD: on HD TTS. Has not missed HD. HD as above.  HTN: BP's wnl, resume home bp meds as needed.  Volume: as above.  Anemia of esrd: Hb 7-9 here. Last OP Hb 8.2, last esa on 5/29. Follow, transfuse prn.  Secondary hyperparathyroidism: Ca in range, add alb/phos.  H/o failed renal transplant: back on HD as of Dec 2024, continues taking prograf / cellcept  at low dose.       Larry Poag  MD [facility] 01/27/2024, 4:28 PM  Recent Labs  Lab 01/27/24 1338  HGB 7.8*  CALCIUM  8.5*  CREATININE 6.33*  K 3.8   Inpatient medications:  doxycycline  100 mg Oral Once   sodium chloride  flush  3 mL Intravenous Q12H    cefTRIAXone (ROCEPHIN)  IV     sodium chloride  flush

## 2024-01-27 NOTE — Assessment & Plan Note (Addendum)
 HD Tu/Th/Sat.  Last HD session yesterday.  ED provider consulted nephrology for possible additional HD session.  However due to patient being stable without an oxygen requirement at this time this was noted not to be urgent. - Nephrology on board, appreciate recommendations

## 2024-01-27 NOTE — Hospital Course (Signed)
 Adam Davis is a 48 year old male  with history of ESRD on HD, hypertension, hyperlipidemia, type 2 diabetes, renal transplant (on immunosuppressive therapy) who was admitted for dyspnea.  Acute hypoxic respiratory failure Acute hypoxic respiratory failure requiring supplemental O2. Recent multifocal pneumonia, with new fever of 101 on admission-exacerbated by suspected pulmonary edema from his ESRD. CXR revealed worsening bilateral airspace disease.  Initially started on ceftriaxone and doxycycline, then broadened to vancomycin.**  ESRD on dialysis Nephrology was consulted, patient received HD without complications during his hospital stay.***   Other chronic conditions were medically managed with home medications and formulary alternatives as necessary (type diabetes, HLD, hypertension)  Follow-up recommendations

## 2024-01-27 NOTE — Progress Notes (Signed)
 Patient received from the ED at 1745 pm. . Was on RA. SPO2 90%. Started O2 via nasal canula 1L. Still 91%, then up to 2L, was 92%. Increased upto 3L and connected to the continuos pulse oxy meter. SPO2 is 94% now. Alert and oriented.  Will continue to monitor

## 2024-01-27 NOTE — ED Provider Notes (Signed)
 [facility] Provider Note   CSN: [id number] Arrival date & time: 01/27/24  1317     History  Chief Complaint  Patient presents with   Shortness of Breath    Adam Davis is a 48 y.o. male.  48 year old male with past medical history of end-stage renal disease and hypertension presenting to the emergency department today with shortness of breath.  Patient states he has had worsening shortness of breath now over the past few days.  He states that he was treated for pneumonia last month and it improved.  He states that he did have dialysis yesterday and normally gets dialysis on Tuesdays, Thursdays, and Saturdays.  He has been afebrile.  Reports he has had a cough that has been nonproductive.  He came to the ER today for further evaluation regarding this due to ongoing symptoms.   Shortness of Breath Associated symptoms: cough        Home Medications Prior to Admission medications   Medication Sig Start Date End Date Taking? Authorizing Provider  amLODipine  (NORVASC ) 10 MG tablet Take 10 mg by mouth daily.    [provider]  Blood Glucose Monitoring Suppl (BLOOD GLUCOSE MONITOR SYSTEM) w/Device KIT Use in the morning, at noon, and at bedtime. Patient not taking: Reported on 12/17/2023 08/07/23   [name], MD  calcitRIOL  (ROCALTROL ) 0.25 MCG capsule Take 1 capsule (0.25 mcg total) by mouth daily. Patient not taking: Reported on 01/23/2024 08/07/23   [name], MD  carvedilol  (COREG ) 25 MG tablet Take 1 tablet (25 mg total) by mouth 2 (two) times daily with a meal. 01/23/24   McDiarmid, [name], MD  Cinacalcet  HCl (SENSIPAR  PO) Take 30 mg by mouth every dialysis. 11/13/23 11/08/24  [provider]  Continuous Glucose Receiver (FREESTYLE LIBRE 3 READER) DEVI 1 DEVICE BY DOES NOT APPLY ROUTE ONCE FOR 1 DOSE    [provider]  Continuous Glucose Sensor (DEXCOM G7 SENSOR) MISC Use as directed 01/23/24    McDiarmid, [name], MD  cyclobenzaprine  (FLEXERIL ) 5 MG tablet Take 1 tablet (5 mg total) by mouth 3 (three) times daily as needed for muscle spasms. Patient not taking: Reported on 01/23/2024 12/24/23   [name], MD  Darbepoetin Alfa  (ARANESP ) 100 MCG/0.5ML SOSY injection Inject 0.5 mLs (100 mcg total) into the skin every Wednesday at 6 PM. 08/08/23   [name], MD  doxercalciferol (HECTOROL) 4 MCG/2ML injection 3 mcg. 10/16/23 10/14/24  [provider]  insulin  glargine-yfgn (SEMGLEE ) 100 UNIT/ML Pen Inject 10 Units into the skin daily. 01/23/24   McDiarmid, [name], MD  Insulin  Pen Needle 32G X 4 MM MISC Use with Semglee  pen 08/07/23     Methoxy PEG-Epoetin Beta (MIRCERA IJ) Inject 150 Doses as directed daily as needed. 09/06/23 09/04/24  [provider]  multivitamin (RENA-VIT) TABS tablet Take 1 tablet by mouth daily. 08/06/23   [name], MD  mycophenolate  (CELLCEPT ) 250 MG capsule Take 1 capsule (250 mg total) by mouth 2 (two) times daily. 08/06/23   [name], MD  rosuvastatin  (CRESTOR ) 10 MG tablet Take one tablet every Monday, Wednesday, and Friday. 01/16/23   [name], [name], DO  sevelamer  carbonate (RENVELA ) 800 MG tablet Take 1 tablet (800 mg total) by mouth 3 (three) times daily with meals. 08/07/23   [name], MD  tacrolimus  (PROGRAF ) 1 MG capsule Take 1 mg by mouth 2 (two) times daily.    [provider]  valsartan  (DIOVAN ) 320  MG tablet Take 1 tablet (320 mg total) by mouth every evening. 12/24/23   [name], MD  atorvastatin  (LIPITOR) 40 MG tablet Take 1 tablet (40 mg total) by mouth daily. Patient not taking: Reported on 07/09/2017 07/13/16 05/09/19  Gunadasa, Kanishka G, MD      Allergies    Atorvastatin     Review of Systems   Review of Systems  Respiratory:  Positive for cough and shortness of breath.   All other systems reviewed and are negative.   Physical Exam Updated Vital Signs BP (!) 156/92   Pulse 90   Temp  99.7 F (37.6 C) (Oral)   Resp (!) 21   Ht 5\' 6"  (1.676 m)   Wt 59 kg   SpO2 100%   BMI 20.98 kg/m  Physical Exam Vitals and nursing note reviewed.   Gen: Chronically ill-appearing, mild conversational dyspnea noted Eyes: PERRL, EOMI HEENT: no oropharyngeal swelling Neck: trachea midline Resp: Diminished at bilateral lung bases Card: RRR, no murmurs, rubs, or gallops Abd: nontender, nondistended Extremities: no calf tenderness, no edema Vascular: 2+ radial pulses bilaterally, 2+ DP pulses bilaterally Skin: no rashes Psyc: acting appropriately   ED Results / Procedures / Treatments   Labs (all labs ordered are listed, but only abnormal results are displayed) Labs Reviewed  BASIC METABOLIC PANEL WITH GFR - Abnormal; Notable for the following components:      Result Value   Chloride 93 (*)    Glucose, Bld 402 (*)    BUN 37 (*)    Creatinine, Ser 6.33 (*)    Calcium  8.5 (*)    GFR, Estimated 10 (*)    All other components within normal limits  CBC - Abnormal; Notable for the following components:   RBC 3.67 (*)    Hemoglobin 7.8 (*)    HCT 25.0 (*)    MCV 68.1 (*)    MCH 21.3 (*)    RDW 20.3 (*)    All other components within normal limits    EKG EKG Interpretation Date/Time:  Sunday January 27 2024 13:44:59 EDT Ventricular Rate:  90 PR Interval:  146 QRS Duration:  86 QT Interval:  436 QTC Calculation: 533 R Axis:   33  Text Interpretation: Normal sinus rhythm Moderate voltage criteria for LVH, may be normal variant ( Sokolow-Lyon , Cornell product ) Nonspecific ST and T wave abnormality Prolonged QT Abnormal ECG When compared with ECG of 02-Aug-2023 21:28, PREVIOUS ECG IS PRESENT Confirmed by Abner Hoffman [phone]) on 01/27/2024 2:19:03 PM  Radiology DG Chest 2 View Result Date: 01/27/2024 CLINICAL DATA:  Chest pain and shortness of breath.  Fever. EXAM: CHEST - 2 VIEW COMPARISON:  12/24/2023 FINDINGS: Stable moderate cardiomegaly. Right jugular dual-lumen central  venous dialysis catheter remains in place. Bilateral heterogeneous airspace disease is seen throughout both lungs with mild worsening since previous study. This may be due to pulmonary edema or atypical infection. No pleural effusion seen. IMPRESSION: Mild worsening of bilateral heterogeneous airspace disease, which may be due to pulmonary edema or atypical infection. Stable cardiomegaly. Electronically Signed   By: Marlyce Sine M.D.   On: 01/27/2024 14:58    Procedures Procedures    Medications Ordered in ED Medications  cefTRIAXone (ROCEPHIN) 1 g in sodium chloride  0.9 % 100 mL IVPB (has no administration in time range)  doxycycline (VIBRA-TABS) tablet 100 mg (has no administration in time range)  ipratropium-albuterol  (DUONEB) 0.5-2.5 (3) MG/3ML nebulizer solution 3 mL (3 mLs Nebulization Given 01/27/24 1426)  ED Course/ Medical Decision Making/ A&P                                 Medical Decision Making 48 year old male with past medical history of end-stage renal disease on dialysis and recent pneumonia presenting to the emergency department today with cough and shortness of breath.  I will further evaluate the patient here with basic labs as well as an EKG and chest x-ray to evaluate for pulmonary edema, pulmonary infiltrates, or pneumothorax.  Give patient a DuoNeb here in the event this due to bronchitis.  I will reevaluate for ultimate disposition.  The patient's labs are largely unremarkable.  X-ray shows worsening pneumonia versus pulmonary edema.  I did call and discussed this with Dr. Jonah Negus from nephrology.  After discussion we will admit the patient for IV antibiotics and he will consult.  With no real oxygen requirement he does not need emergent dialysis at this time but may need some further dialysis if this is secondary to pulmonary edema.  If this is secondary to pneumonia he has had worsening symptoms despite being treated last month.  A call was placed to family medicine  service for admission.  Amount and/or Complexity of Data Reviewed Labs: ordered. Radiology: ordered.  Risk Prescription drug management. Decision regarding hospitalization.           Final Clinical Impression(s) / ED Diagnoses Final diagnoses:  Pneumonia due to infectious organism, unspecified laterality, unspecified part of lung    Rx / DC Orders ED Discharge Orders     None         Carin Charleston, MD 01/27/24 1538

## 2024-01-27 NOTE — Assessment & Plan Note (Addendum)
 Recent history of multifocal pneumonia treated by azithromycin with resolution of symptoms this past month.  Reinfection vs pulmonary edema causing dyspnea is most likely due to weakened immune system with history of ESRD on HD with past kidney transplant in 2006.  Patient has remained afebrile, however is hypertensive and tachypneic on exam.  CXR revealed worsening bilateral airspace disease throughout likely secondary to pulmonary edema versus atypical infection.  No oxygen requirement necessary at this time, but continues to have conversational dyspnea s/p DuoNeb. - Admit to FMTS, attending Dr. McDiarmid - Med-Tele, Vital signs per floor - Renal diet  - PT/OT to treat - VTE prophylaxis: Heparin  - Continue antibiotics: CTX and Doxycycline - redose in AM - Continuous pulse ox

## 2024-01-27 NOTE — Assessment & Plan Note (Addendum)
 T2DM: A1c 6.8 in Dec 2024, on Semglee  10 units daily in AM. Repeat A1c here. Very sensitive SSI while inpatient HLD: Rosuvastatin  10 mg MWF HTN: Continue home regimen with formulary alternative - Valsartan  320 mg daily, Carvedilol  25 BID, and Amlodipine  10 mg daily ESRD w/ hx of renal transplant Sept 2006, now back on HD Tu/Th/Sat: Continue Tacrolimus  1 mg BID, awaiting nephro recs for additional medications

## 2024-01-27 NOTE — H&P (Cosign Needed Addendum)
 Hospital Admission History and Physical Service Pager: [phone]  Patient name: Adam Davis Medical record number: [id number] Date of Birth: 09/25/1975 Age: 48 y.o. Gender: male  Primary Care Provider: Farris Hong, MD Consultants: Nephrology Code Status: Full Code Preferred Emergency Contact:  Contact Information     Name Relation Home Work Mobile   McCoy,Tranesse Spouse   [phone]   Tester,Mable Mother [phone]        Other Contacts   None on File    Chief Complaint: shortness of breath  Assessment and Plan: Adam Davis is a 48 y.o. male presenting with shortness of breath. Differential for this patient's presentation of this includes: Pulmonary edema - Most likely secondary to his ESRD and recent acute infection PNA - Possibly a secondary PNA due to his weakened immune system from previous infection and history.  Although no history of fever or focal consolidation noted on CXR or diminished breath sounds on exam Bronchitis - Unlikely since there is no sputum production PE - Unlikely d/t no hx of prolonged travel, hx of DVT, or tachycardia on exam. Assessment & Plan Dyspnea Recent history of multifocal pneumonia treated by azithromycin with resolution of symptoms this past month.  Reinfection vs pulmonary edema causing dyspnea is most likely due to weakened immune system with history of ESRD on HD with past kidney transplant in 2006.  Patient has remained afebrile, however is hypertensive and tachypneic on exam.  CXR revealed worsening bilateral airspace disease throughout likely secondary to pulmonary edema versus atypical infection.  No oxygen requirement necessary at this time, but continues to have conversational dyspnea s/p DuoNeb. - Admit to FMTS, attending Dr. McDiarmid - Med-Tele, Vital signs per floor - Renal diet  - PT/OT to treat - VTE prophylaxis: Heparin  - Continue antibiotics: CTX and Doxycycline - redose in AM - Continuous pulse  ox ESRD (end stage renal disease) on dialysis (HCC) HD Tu/Th/Sat.  Last HD session yesterday.  ED provider consulted nephrology for possible additional HD session.  However due to patient being stable without an oxygen requirement at this time this was noted not to be urgent. - Nephrology on board, appreciate recommendations Chronic health problem T2DM: A1c 6.8 in Dec 2024, on Semglee  10 units daily in AM. Repeat A1c here. Very sensitive SSI while inpatient HLD: Rosuvastatin  10 mg MWF HTN: Continue home regimen with formulary alternative - Valsartan  320 mg daily, Carvedilol  25 BID, and Amlodipine  10 mg daily ESRD w/ hx of renal transplant Sept 2006, now back on HD Tu/Th/Sat: Continue Tacrolimus  1 mg BID, awaiting nephro recs for additional medications  FEN/GI: Renal diet VTE Prophylaxis: Heparin   Disposition: Med-Tele  History of Present Illness:  Adam Davis is a 48 y.o. male presenting with shortness of breath. Reports this has been worsening over the past two weeks. Unsure if he was exposed to sick contacts since he has to attend dialysis. Recently was treated with Azithromycin 250 mg for 5 days for multifocal pneumonia a month ago and his symptoms improved after. Patient has been consistently going to his dialysis sessions and doesn't feel fluid overloaded at this time, his last session was yesterday. Patient denies any fevers, minor chills, and no body aches. Nonproductive cough for 2 weeks. Denies headaches, blurry vision, chest pain, abdominal pain, nausea, vomiting, or diarrhea. Endorses some back and leg pain due to his sciatica.   In the ED, patient has hypertensive, tachypneic, and afebrile. CBC with anemia (7.8), but no leukocytosis. BMP with elevated Cr to  6.33, elevated glucose (402), otherwise electrolytes stable. Nephrology was consulted and may consider an additional session of dialysis, but didn't deem it urgent since he has no oxygen requirement at this time. Patient  continues to have conversational dyspnea. CXR revealed mild worsening of bilateral airspace disease, likely due to pulmonary edema or atypical infection and stable cardiomegaly. EKG was stable. Treated with DuoNeb, Rocephin, and Doxycycline.   Review Of Systems: Per HPI  Pertinent Past Medical History: ESRD on HD (Tu/Th/Sat) HTN HLD T2DM Remainder reviewed in history tab.   Pertinent Past Surgical History: AV Fistulagram Kidney Transplant Remainder reviewed in history tab.   Pertinent Social History: Tobacco use: No Alcohol use: No Other Substance use: No Lives with wife, 3 boys  Pertinent Family History: Brother: DM Maternal Gma: DM, HTN Remainder reviewed in history tab.   Important Outpatient Medications: Amlodipine  10 mg daily  Calcitriol  0.25 mcg daily Carvedilol  25 mg BID Cinacalcet  30 mg w/ HD Cyclobenzaprine  5 mg TID prn for muscle spasms Aranesp  0.5 mL q Wednesday Semglee  10 units daily Mircera prn Cellcept  250 mg BID Hectorol Renvela  800 mg TID  Rosuvastatin  10 mg MWF Tacrolimus  1 mg BID Valsartan  320 mg daily Remainder reviewed in medication history.   Objective: BP (!) 146/93   Pulse 91   Temp 99.7 F (37.6 C) (Oral)   Resp 19   Ht 5\' 6"  (1.676 m)   Wt 59 kg   SpO2 98%   BMI 20.98 kg/m  Exam: General: Awake and Alert in NAD HEENT: NCAT. Sclera anicteric. No rhinorrhea. Cardiovascular: RRR. No M/R/G Respiratory: CTAB, tachypneic on RA. No wheezing, crackles, rhonchi, or diminished breath sounds. Abdomen: Soft, non-tender, non-distended. Bowel sounds normoactive Extremities: Able to move all extremities.1+ BLE edema, no deformities or significant joint findings. Skin: Warm and dry. No abrasions or rashes noted. Neuro: A&Ox3. No focal neurological deficits.  Labs:  CBC BMET  Recent Labs  Lab 01/27/24 1338  WBC 8.6  HGB 7.8*  HCT 25.0*  PLT 228   Recent Labs  Lab 01/27/24 1338  NA 135  K 3.8  CL 93*  CO2 27  BUN 37*  CREATININE  6.33*  GLUCOSE 402*  CALCIUM  8.5*     EKG: Regular rate and rhythm. Prolonged Qtc.  Imaging Studies Performed: CXR Mild worsening of bilateral heterogeneous airspace disease, which may be due to pulmonary edema or atypical infection.  My Interpretation: Bilateral airspace disease seen throughout. No focal consolidation or pleural effusion noted.  Clyda Dark, DO 01/27/2024, 4:21 PM PGY-1, [facility]  FPTS Intern pager: [phone], text pages welcome Secure chat group [facility] Teaching Service    FPTS Upper-Level Resident Addendum   I have independently interviewed and examined the patient. I have discussed the above with Dr. Randeen Busman and agree with the documented plan. My edits for correction/addition/clarification are included above. Please see any attending notes.   [name], MD PGY-2, [place] [place] [facility] 01/27/2024 6:00 PM  FPTS Service pager: [phone] (text pages welcome through [employer])

## 2024-01-27 NOTE — Progress Notes (Signed)
Incentive spirometry introduced with teach back 

## 2024-01-27 NOTE — Progress Notes (Signed)
 Pharmacy Antibiotic Note  AMMON MUSCATELLO is a 48 y.o. male admitted on 01/27/2024 with pneumonia, recent azithromycin use and additionally s/p renal transplant 2006 on tacrolimus  and mychophenolate.  Pharmacy has been consulted for vancomycin dosing.  Plan: Vancomycin 1250 mg IV loading dose followed by 500 mg qHD F/u culture data, clinical improvement Monitor for changes in HD plans  Height: 5\' 6"  (167.6 cm) Weight: 59 kg (130 lb) IBW/kg (Calculated) : 63.8  Temp (24hrs), Avg:100.3 F (37.9 C), Min:99.7 F (37.6 C), Max:101 F (38.3 C)  Recent Labs  Lab 01/27/24 1338  WBC 8.6  CREATININE 6.33*    Estimated Creatinine Clearance: 11.9 mL/min (A) (by C-G formula based on SCr of 6.33 mg/dL (H)).    Allergies  Allergen Reactions   Atorvastatin  Other (See Comments)    Myalgia - resolved with discontinuation    Antimicrobials this admission: CRO6/1 Vanc 6/1 >> Doxy 6/1 >>  Microbiology results:  Thank you for allowing pharmacy to be a part of this patient's care.  Heddy Liverpool, PharmD PGY2 Critical Care Pharmacy Resident 01/27/2024 9:11 PM

## 2024-01-27 NOTE — Plan of Care (Signed)
 FMTS Brief Progress Note  S: Went to patient bedside for night rounds with Dr. Telford Feather.  Patient sitting up, had just finished dinner.  Patient states he came to the hospital due to cough and shortness of breath.  Patient states he feels okay right now.  Demonstrated incentive spirometer use, was able to pull up to ~1 L.    O: BP (!) 161/98 (BP Location: Left Arm)   Pulse 95   Temp 100.1 F (37.8 C) (Oral)   Resp 19   Ht 5\' 6"  (1.676 m)   Wt 59 kg   SpO2 93%   BMI 20.98 kg/m   General: Sitting up in bed, awake and alert, speaking in short sentences, in no acute distress CV: RRR, normal S1/S2, no murmurs Respiratory: CTAB, coarse breath sounds bilaterally at bases, tachypneic, on 3 L LFNC Neuro: no focal deficits  A/P: Dyspnea Continues to have tachypnea and conversational dyspnea - Will closely monitor for worsening dyspnea overnight and titrate supplemental oxygen as indicated - Remainder of plan per day team - Orders reviewed. Labs for AM not ordered, which was adjusted as needed.  - If condition changes, plan includes broadening antibiotics if patient becomes febrile.   Naida Austria, MD 01/27/2024, 8:31 PM PGY-1, [facility] Night Resident  Please page [phone] with questions.

## 2024-01-27 NOTE — ED Triage Notes (Signed)
 Pt arrived POV from home c/o [facility] and a fever on and off for the last couple of days. Pt states he had pneumonia a couple weeks back and it feels similar.

## 2024-01-28 ENCOUNTER — Other Ambulatory Visit: Payer: Self-pay

## 2024-01-28 DIAGNOSIS — E8770 Fluid overload, unspecified: Secondary | ICD-10-CM

## 2024-01-28 DIAGNOSIS — Z992 Dependence on renal dialysis: Secondary | ICD-10-CM

## 2024-01-28 DIAGNOSIS — E8779 Other fluid overload: Secondary | ICD-10-CM

## 2024-01-28 DIAGNOSIS — J189 Pneumonia, unspecified organism: Secondary | ICD-10-CM | POA: Diagnosis not present

## 2024-01-28 DIAGNOSIS — N186 End stage renal disease: Secondary | ICD-10-CM

## 2024-01-28 LAB — CBC WITH DIFFERENTIAL/PLATELET
Abs Immature Granulocytes: 0.04 10*3/uL (ref 0.00–0.07)
Basophils Absolute: 0 10*3/uL (ref 0.0–0.1)
Basophils Relative: 0 %
Eosinophils Absolute: 0.4 10*3/uL (ref 0.0–0.5)
Eosinophils Relative: 4 %
HCT: 22.7 % — ABNORMAL LOW (ref 39.0–52.0)
Hemoglobin: 7.1 g/dL — ABNORMAL LOW (ref 13.0–17.0)
Immature Granulocytes: 1 %
Lymphocytes Relative: 15 %
Lymphs Abs: 1.4 10*3/uL (ref 0.7–4.0)
MCH: 21.1 pg — ABNORMAL LOW (ref 26.0–34.0)
MCHC: 31.3 g/dL (ref 30.0–36.0)
MCV: 67.6 fL — ABNORMAL LOW (ref 80.0–100.0)
Monocytes Absolute: 0.8 10*3/uL (ref 0.1–1.0)
Monocytes Relative: 9 %
Neutro Abs: 6.2 10*3/uL (ref 1.7–7.7)
Neutrophils Relative %: 71 %
Platelets: 217 10*3/uL (ref 150–400)
RBC: 3.36 MIL/uL — ABNORMAL LOW (ref 4.22–5.81)
RDW: 19.9 % — ABNORMAL HIGH (ref 11.5–15.5)
WBC: 8.8 10*3/uL (ref 4.0–10.5)
nRBC: 0 % (ref 0.0–0.2)

## 2024-01-28 LAB — RENAL FUNCTION PANEL
Albumin: 2.3 g/dL — ABNORMAL LOW (ref 3.5–5.0)
Anion gap: 13 (ref 5–15)
BUN: 51 mg/dL — ABNORMAL HIGH (ref 6–20)
CO2: 26 mmol/L (ref 22–32)
Calcium: 8.4 mg/dL — ABNORMAL LOW (ref 8.9–10.3)
Chloride: 96 mmol/L — ABNORMAL LOW (ref 98–111)
Creatinine, Ser: 8.13 mg/dL — ABNORMAL HIGH (ref 0.61–1.24)
GFR, Estimated: 8 mL/min — ABNORMAL LOW (ref >60)
Glucose, Bld: 183 mg/dL — ABNORMAL HIGH (ref 70–99)
Phosphorus: 6.6 mg/dL — ABNORMAL HIGH (ref 2.5–4.6)
Potassium: 4 mmol/L (ref 3.5–5.1)
Sodium: 135 mmol/L (ref 135–145)

## 2024-01-28 LAB — GLUCOSE, CAPILLARY
Glucose-Capillary: 347 mg/dL — ABNORMAL HIGH (ref 70–99)
Glucose-Capillary: 377 mg/dL — ABNORMAL HIGH (ref 70–99)
Glucose-Capillary: 303 mg/dL — ABNORMAL HIGH (ref 70–99)

## 2024-01-28 LAB — HEMOGLOBIN A1C
Hgb A1c MFr Bld: 8.9 % — ABNORMAL HIGH (ref 4.8–5.6)
Mean Plasma Glucose: 208.73 mg/dL

## 2024-01-28 LAB — HEPATITIS B SURFACE ANTIGEN: Hepatitis B Surface Ag: NONREACTIVE

## 2024-01-28 MED ORDER — HEPARIN SODIUM (PORCINE) 1000 UNIT/ML IJ SOLN
INTRAMUSCULAR | Status: AC
  Filled 2024-01-28: qty 3

## 2024-01-28 MED ORDER — INSULIN GLARGINE-YFGN 100 UNIT/ML ~~LOC~~ SOLN
6.0000 [IU] | SUBCUTANEOUS | Status: DC
  Administered 2024-01-29 – 2024-01-30 (×2): 6 [IU] via SUBCUTANEOUS
  Filled 2024-01-28 (×3): qty 0.06

## 2024-01-28 MED ORDER — SODIUM CHLORIDE 0.9 % IV SOLN
2.0000 g | Freq: Every day | INTRAVENOUS | Status: AC
  Administered 2024-01-28 – 2024-01-29 (×2): 2 g via INTRAVENOUS
  Filled 2024-01-28 (×2): qty 20

## 2024-01-28 MED ORDER — CHLORHEXIDINE GLUCONATE CLOTH 2 % EX PADS
6.0000 | MEDICATED_PAD | Freq: Every day | CUTANEOUS | Status: DC
  Administered 2024-01-28 – 2024-01-30 (×3): 6 via TOPICAL

## 2024-01-28 MED ORDER — SEVELAMER CARBONATE 800 MG PO TABS
800.0000 mg | ORAL_TABLET | Freq: Three times a day (TID) | ORAL | Status: DC
  Administered 2024-01-28 – 2024-01-30 (×5): 800 mg via ORAL
  Filled 2024-01-28 (×5): qty 1

## 2024-01-28 NOTE — Progress Notes (Signed)
 Daily Progress Note Intern Pager: [phone]  Patient name: ALEM FAHL Medical record number: [id number] Date of birth: 19-Aug-1976 Age: 48 y.o. Gender: male  Primary Care Provider: Farris Hong, MD Consultants: Nephrology Code Status: Full Code  Pt Overview and Major Events to Date:  6/1: Admitted  Assessment and Plan: Earlin Sweeden is a 48 year old male with PMH of ESRD on HD s/p failed renal transplant, T2DM, HLD, HTN presenting with dyspnea likely secondary to pulmonary edema versus possible reinfection of pneumonia due to immunosuppressed state.  Currently patient is on 2 L [place] with increased work of breathing.  Discussed that another HD session may be beneficial to improve his symptoms, after discussion patient is agreeable.  Currently being treated for pneumonia with broad-spectrum abx. Assessment & Plan Dyspnea Reinfection vs pulmonary edema per worsening bilateral airspace disease noted on CXR causing dyspnea is most likely due to his immunosuppressed state with history of ESRD on HD with past kidney transplant in 2006.  Overnight patient fevered to 101, but defervesced.  Antibiotic coverage was expanded to include vancomycin.  Patient continues to require 2 to 3 L of [place] to maintain O2 saturations. - MRSA swab, DC vanc if negative - Redosed CTX 2g x 4 doses and continue Doxycyline - Renal diet  - Continuous pulse ox ESRD (end stage renal disease) on dialysis (HCC) HD Tu/Th/Sat.  Last HD session Saturday.  Nephrology recommended additional HD session today due to increasing oxygen requirement to help symptomatic improvement.  Patient was hesitant due to his normal schedule, but after discussion he is amenable. - HD today - Nephrology on board, appreciate recommendations Chronic health problem T2DM: A1c 6.8 in Dec 2024, on Semglee  10 units daily in AM. Repeat A1c here. Very sensitive SSI while inpatient HLD: Rosuvastatin  10 mg MWF HTN: Continue home regimen with  formulary alternative - Valsartan  320 mg daily, Carvedilol  25 BID, and Amlodipine  10 mg daily ESRD w/ hx of renal transplant Sept 2006, now back on HD Tu/Th/Sat: Continue Tacrolimus  1 mg BID, awaiting nephro recs for additional medications  FEN/GI: Renal diet PPx: Heparin  Dispo:Home pending clinical improvement . Barriers include oxygen requirement.   Subjective:  Patient is doing well this morning.  Reports that he feels like his breathing has not worsened but he continues to cough with no sputum production.  Endorses that his belly breathing has been new since getting pneumonia previously.  Objective: Temp:  [98.7 F (37.1 C)-101 F (38.3 C)] 98.7 F (37.1 C) (06/02 1003) Pulse Rate:  [87-99] 92 (06/02 1003) Resp:  [17-45] 17 (06/02 1003) BP: (146-161)/(88-104) 159/104 (06/02 1003) SpO2:  [90 %-100 %] 100 % (06/02 1003) Weight:  [59 kg] 59 kg (06/01 1331) Physical Exam: General: Awake and alert in NAD HEENT: NCAT. Sclera anicteric. No rhinorrhea. Cardiovascular: RRR. No M/R/G Respiratory: CTAB, inc WOB on 2L [place]. No wheezing, crackles, rhonchi, or diminished breath sounds. Abdomen: Soft, non-tender, non-distended. Bowel sounds normoactive.  Belly breathing. Extremities: Able to move all extremities. No BLE edema, no deformities or significant joint findings. Skin: Warm and dry. No abrasions or rashes noted. Neuro: AAOx3. No focal neurological deficits  Laboratory: Most recent CBC Lab Results  Component Value Date   WBC 8.8 01/28/2024   HGB 7.1 (L) 01/28/2024   HCT 22.7 (L) 01/28/2024   MCV 67.6 (L) 01/28/2024   PLT 217 01/28/2024   Most recent BMP    Latest Ref Rng & Units 01/28/2024    5:52 AM  BMP  Glucose  70 - 99 mg/dL 161   BUN 6 - 20 mg/dL 51   Creatinine 0.96 - 1.24 mg/dL 0.45   Sodium 409 - 811 mmol/L 135   Potassium 3.5 - 5.1 mmol/L 4.0   Chloride 98 - 111 mmol/L 96   CO2 22 - 32 mmol/L 26   Calcium  8.9 - 10.3 mg/dL 8.4     Imaging/Diagnostic Tests: No  new imaging.  Clyda Dark, DO 01/28/2024, 12:07 PM  PGY-1, [facility] FPTS Intern pager: [phone], text pages welcome Secure chat group [facility] Teaching Service

## 2024-01-28 NOTE — Inpatient Diabetes Management (Signed)
 Inpatient Diabetes Program Recommendations  AACE/ADA: New Consensus Statement on Inpatient Glycemic Control (2015)  Target Ranges:  Prepandial:   less than 140 mg/dL      Peak postprandial:   less than 180 mg/dL (1-2 hours)      Critically ill patients:  140 - 180 mg/dL   Lab Results  Component Value Date   GLUCAP 331 (H) 01/27/2024   HGBA1C 8.9 (H) 01/28/2024    Latest Reference Range & Units 01/27/24 20:52  Glucose-Capillary 70 - 99 mg/dL 956 (H)  (H): Data is abnormally high  Diabetes history: DM2 Outpatient Diabetes medications: Semglee  10 units daily, Libre 3 CGM Current orders for Inpatient glycemic control: Novolog  0-6 units tid correction  Inpatient Diabetes Program Recommendations:   Please consider: -Add Semglee  6-8 units daily  Thank you, Jolynn Bajorek E. Oshae Simmering, RN, MSN, CDCES  Diabetes Coordinator Inpatient Glycemic Control Team Team Pager [phone] (8am-5pm) 01/28/2024 11:10 AM

## 2024-01-28 NOTE — Progress Notes (Signed)
 OT Cancellation Note  Patient Details Name: Adam Davis MRN: [id number] DOB: 1975-12-24   Cancelled Treatment:    Reason Eval/Treat Not Completed: OT screened, no needs identified, will sign off (per discussion with PT, pt with no acute OT needs at this time, will screen. Please reconsult if there is a change in pt status)  Valena Ivanov K, OTD, OTR/L SecureChat Preferred Acute Rehab [phone]    Antionette Kirks 01/28/2024, 9:36 AM

## 2024-01-28 NOTE — Assessment & Plan Note (Signed)
 HD Tu/Th/Sat.  Last HD session Saturday.  Nephrology recommended additional HD session today due to increasing oxygen requirement to help symptomatic improvement.  Patient was hesitant due to his normal schedule, but after discussion he is amenable. - HD today - Nephrology on board, appreciate recommendations

## 2024-01-28 NOTE — Progress Notes (Addendum)
   01/28/24 1916  Vitals  Temp 99.6 F (37.6 C)  Pulse Rate 85  Resp (!) 26  BP (!) 144/84  SpO2 100 %  O2 Device Nasal Cannula  Weight 56.5 kg  Type of Weight Post-Dialysis  Oxygen Therapy  O2 Flow Rate (L/min) 2 L/min  Post Treatment  Dialyzer Clearance Lightly streaked  Hemodialysis Intake (mL) 200 mL  Liters Processed 84  Fluid Removed (mL) 2300 mL  Tolerated HD Treatment Yes   Received patient in bed to unit.  Alert and oriented.  Informed consent signed and in chart.   TX duration: Three hours and thirty minutes  Patient tolerated well.  Transported back to the room  Alert, without acute distress.  Hand-off given to patient's nurse.   Access used: Right HD Chest Catheter Access issues: None

## 2024-01-28 NOTE — Progress Notes (Signed)
 SATURATION QUALIFICATIONS: (This note is used to comply with regulatory documentation for home oxygen)  Patient Saturations on Room Air at Rest = 84%  Patient Saturations on 1L at rest = 91%  Patient Saturations on 2 Liters of oxygen while Ambulating = 93%  Please briefly explain why patient needs home oxygen:Pt currently with desaturation at all times without supplemental oxygen.  Annis Baseman, PT Acute Rehabilitation Services Office: [phone]

## 2024-01-28 NOTE — Evaluation (Signed)
 Physical Therapy Brief Evaluation and Discharge Note Patient Details Name: Adam Davis MRN: [id number] DOB: 13-Mar-1976 Today's Date: 01/28/2024   History of Present Illness  48 yo male adm 01/27/24 with SOB. PMhx: ESRD on HD TTS, HTN, HLD, T2DM  Clinical Impression  PT pleasant reports living at home with wife and kids, being independent and driving himself to HD. Pt with desaturation to 84% on RA at rest, required 1L at rest to maintain 91% and 2l during gait for SPO2 93%. Pt educated for IS use with pt consistently achieving 500cc and at  times up to 1000cc. Pt educated for current supplemental O2 requirement and for breaking down activities with decreased pulmonary function. Pt voiced understanding and does not require  further acute therapy but would benefit from additional ambulatory saturation checks prior to D/C. Will sign off with pt aware and agreeable.     PT Assessment Patient does not need any further PT services  Assistance Needed at Discharge  None    Equipment Recommendations None recommended by PT  Recommendations for Other Services       Precautions/Restrictions Precautions Precautions: Other (comment) Recall of Precautions/Restrictions: Intact Precaution/Restrictions Comments: watch sats        Mobility  Bed Mobility       General bed mobility comments: pt sitting EOB on arrival and end of session  Transfers Overall transfer level: Modified independent                      Ambulation/Gait Ambulation/Gait assistance: Modified independent (Device/Increase time) Gait Distance (Feet): 200 Feet Assistive device: None Gait Pattern/deviations: Step-through pattern, Decreased stride length Gait Speed: Below normal General Gait Details: pt with decreased speed and stride  Home Activity Instructions    Stairs Stairs: Yes Stairs assistance: Modified independent (Device/Increase time) Stair Management: One rail Right, Alternating pattern,  Forwards Number of Stairs: 4    Modified Rankin (Stroke Patients Only)        Balance Overall balance assessment: No apparent balance deficits (not formally assessed)                        Pertinent Vitals/Pain PT - Brief Vital Signs All Vital Signs Stable: Other (comment) (86% on RA, 91% on 1L, 94% on 2L) Pain Assessment Pain Assessment: No/denies pain     Home Living Family/patient expects to be discharged to:: Private residence Living Arrangements: Spouse/significant other;Children Available Help at Discharge: Family;Available PRN/intermittently Home Environment: Stairs to enter  Stairs-Number of Steps: 4 Home Equipment: None        Prior Function Level of Independence: Independent      UE/LE Assessment   UE ROM/Strength/Tone/Coordination: WFL    LE ROM/Strength/Tone/Coordination: [facility]      Communication   Communication Communication: No apparent difficulties     Cognition Overall Cognitive Status: Appears within functional limits for tasks assessed/performed       General Comments      Exercises     Assessment/Plan    PT Problem List         PT Visit Diagnosis Other abnormalities of gait and mobility (R26.89)    No Skilled PT All education completed;Patient is modified independent with all activity/mobility;Patient at baseline level of functioning   Co-evaluation                AMPAC 6 Clicks Help needed turning from your back to your side while in a flat bed without using bedrails?: None  Help needed moving from lying on your back to sitting on the side of a flat bed without using bedrails?: None Help needed moving to and from a bed to a chair (including a wheelchair)?: None Help needed standing up from a chair using your arms (e.g., wheelchair or bedside chair)?: None Help needed to walk in hospital room?: None Help needed climbing 3-5 steps with a railing? : None 6 Click Score: 24      End of Session   Activity  Tolerance: Patient tolerated treatment well Patient left: in bed;with call bell/phone within reach Nurse Communication: Mobility status PT Visit Diagnosis: Other abnormalities of gait and mobility (R26.89)     Time: 6045-4098 PT Time Calculation (min) (ACUTE ONLY): 22 min  Charges:   PT Evaluation $PT Eval Low Complexity: 1 Low      Lopaka Karge P, PT Acute Rehabilitation Services Office: [phone]   Camaya Gannett B Mindi Akerson  01/28/2024, 10:30 AM

## 2024-01-28 NOTE — Assessment & Plan Note (Addendum)
 T2DM: A1c 6.8 in Dec 2024, on Semglee  10 units daily in AM. Repeat A1c here. Very sensitive SSI while inpatient HLD: Rosuvastatin  10 mg MWF HTN: Continue home regimen with formulary alternative - Valsartan  320 mg daily, Carvedilol  25 BID, and Amlodipine  10 mg daily ESRD w/ hx of renal transplant Sept 2006, now back on HD Tu/Th/Sat: Continue Tacrolimus  1 mg BID, awaiting nephro recs for additional medications

## 2024-01-28 NOTE — Progress Notes (Signed)
 [facility] Progress Note   Subjective:   Pt was hesitant to proceed with HD today because it is not his regularly scheduled HD day. Discussed CXR results and that he likely needs lower EDW due to weight loss from illness. He is worried about cramping but agrees to do HD today and tomorrow with UF goal around 2-2.5L as tolerated. On O2 but denies SOB, CP, dizziness, nausea.   Objective Vitals:   01/27/24 1810 01/27/24 2049 01/28/24 0551 01/28/24 1003  BP:  (!) 149/92 (!) 147/89 (!) 159/104  Pulse:  99 91 92  Resp:  20 18 17   Temp:  (!) 101 F (38.3 C) 99.1 F (37.3 C) 98.7 F (37.1 C)  TempSrc:  Oral Oral Oral  SpO2: 93% 98% 93% 100%  Weight:      Height:       Physical Exam General: Alert male in NAD Heart: RRR, no murmurs, rubs or gallops Lungs: + expiratory crackles b/l lower lobes Abdomen: Soft,non-distended, +BS Extremities: trace edema b/l lower extremities Dialysis Access: [facility]  Additional Objective Labs: Basic Metabolic Panel: Recent Labs  Lab 01/27/24 1338 01/28/24 0552  NA 135 135  K 3.8 4.0  CL 93* 96*  CO2 27 26  GLUCOSE 402* 183*  BUN 37* 51*  CREATININE 6.33* 8.13*  CALCIUM  8.5* 8.4*  PHOS  --  6.6*   Liver Function Tests: Recent Labs  Lab 01/28/24 0552  ALBUMIN 2.3*   No results for input(s): "LIPASE", "AMYLASE" in the last 168 hours. CBC: Recent Labs  Lab 01/27/24 1338 01/28/24 0552  WBC 8.6 8.8  NEUTROABS  --  6.2  HGB 7.8* 7.1*  HCT 25.0* 22.7*  MCV 68.1* 67.6*  PLT 228 217   Blood Culture    Component Value Date/Time   SDES BLOOD SITE NOT SPECIFIED 07/31/2023 1935   SDES BLOOD SITE NOT SPECIFIED 07/31/2023 1935   SPECREQUEST  07/31/2023 1935    BOTTLES DRAWN AEROBIC AND ANAEROBIC Blood Culture adequate volume   SPECREQUEST  07/31/2023 1935    BOTTLES DRAWN AEROBIC AND ANAEROBIC Blood Culture adequate volume   CULT  07/31/2023 1935    NO GROWTH 5 DAYS Performed at [facility], [address],  [place], [place] [address]    CULT  07/31/2023 1935    NO GROWTH 5 DAYS Performed at [facility], [address], [place], [place] [address]    REPTSTATUS 08/05/2023 FINAL 07/31/2023 1935   REPTSTATUS 08/05/2023 FINAL 07/31/2023 1935    Cardiac Enzymes: No results for input(s): "CKTOTAL", "CKMB", "CKMBINDEX", "TROPONINI" in the last 168 hours. CBG: Recent Labs  Lab 01/27/24 2052  GLUCAP 331*   Iron Studies: No results for input(s): "IRON", "TIBC", "TRANSFERRIN", "FERRITIN" in the last 72 hours. @lablastinr3 @ Studies/Results: DG Chest 2 View Result Date: 01/27/2024 CLINICAL DATA:  Chest pain and shortness of breath.  Fever. EXAM: CHEST - 2 VIEW COMPARISON:  12/24/2023 FINDINGS: Stable moderate cardiomegaly. Right jugular dual-lumen central venous dialysis catheter remains in place. Bilateral heterogeneous airspace disease is seen throughout both lungs with mild worsening since previous study. This may be due to pulmonary edema or atypical infection. No pleural effusion seen. IMPRESSION: Mild worsening of bilateral heterogeneous airspace disease, which may be due to pulmonary edema or atypical infection. Stable cardiomegaly. Electronically Signed   By: Marlyce Sine M.D.   On: 01/27/2024 14:58   Medications:  [START ON 01/29/2024] vancomycin      amLODipine   10 mg Oral Daily  carvedilol   25 mg Oral BID WC   Chlorhexidine  Gluconate Cloth  6 each Topical Q0600   doxycycline  100 mg Oral Q12H   heparin   5,000 Units Subcutaneous Q8H   insulin  aspart  0-6 Units Subcutaneous TID WC   irbesartan  300 mg Oral Daily   mycophenolate   500 mg Oral BID   rosuvastatin   10 mg Oral Daily   tacrolimus   1 mg Oral BID    Dialysis Orders: [facility] TTS 4h   B400   57.5kg   TDC   Heparin  none Last OP HD 5/31, post wt 57.8 UF is usually 2-2.5 L max, gets to dry wt  Mircera 225 mcg q 2 wks, last 5/29  Assessment/Plan: SOB: for last few days, CXR and exam suggest vol overload/ pulm edema. Not in  distress. Declined HD this AM but agreeable after discussing rationale. Planned for HD today and tomorrow as toelrated, discussed that he may  need a lower dry weight.  ESRD: on HD TTS. Has not missed HD. HD as above.  HTN: BP's wnl, resume home bp meds as needed.  Volume: as above.  Anemia of esrd: Hb 7's here. Last OP Hb 8.2, last esa on 5/29. Follow, transfuse prn.  Secondary hyperparathyroidism: Ca in range, Phos 6.6. Resumed renvela  H/o failed renal transplant: back on HD as of Dec 2024, continues taking prograf / cellcept  at low dose.   Ramona Burner, PA-C 01/28/2024, 10:55 AM  [facility] Pager: [phone]

## 2024-01-28 NOTE — Progress Notes (Signed)
 PER FLOOR RN PT REFUSED DUE TO TTS SCHEDULE

## 2024-01-28 NOTE — Plan of Care (Signed)

## 2024-01-28 NOTE — Assessment & Plan Note (Addendum)
 Reinfection vs pulmonary edema per worsening bilateral airspace disease noted on CXR causing dyspnea is most likely due to his immunosuppressed state with history of ESRD on HD with past kidney transplant in 2006.  Overnight patient fevered to 101, but defervesced.  Antibiotic coverage was expanded to include vancomycin.  Patient continues to require 2 to 3 L of  to maintain O2 saturations. - MRSA swab, DC vanc if negative - Redosed CTX 2g x 4 doses and continue Doxycyline - Renal diet  - Continuous pulse ox

## 2024-01-29 ENCOUNTER — Other Ambulatory Visit (HOSPITAL_COMMUNITY): Payer: Self-pay

## 2024-01-29 ENCOUNTER — Inpatient Hospital Stay (HOSPITAL_COMMUNITY)

## 2024-01-29 DIAGNOSIS — N186 End stage renal disease: Secondary | ICD-10-CM | POA: Diagnosis not present

## 2024-01-29 DIAGNOSIS — Z992 Dependence on renal dialysis: Secondary | ICD-10-CM | POA: Diagnosis not present

## 2024-01-29 DIAGNOSIS — I5022 Chronic systolic (congestive) heart failure: Secondary | ICD-10-CM

## 2024-01-29 DIAGNOSIS — J189 Pneumonia, unspecified organism: Secondary | ICD-10-CM | POA: Diagnosis not present

## 2024-01-29 DIAGNOSIS — E8779 Other fluid overload: Secondary | ICD-10-CM | POA: Diagnosis not present

## 2024-01-29 LAB — CBC
HCT: 24.2 % — ABNORMAL LOW (ref 39.0–52.0)
Hemoglobin: 7.5 g/dL — ABNORMAL LOW (ref 13.0–17.0)
MCH: 21 pg — ABNORMAL LOW (ref 26.0–34.0)
MCHC: 31 g/dL (ref 30.0–36.0)
MCV: 67.8 fL — ABNORMAL LOW (ref 80.0–100.0)
Platelets: 175 10*3/uL (ref 150–400)
RBC: 3.57 MIL/uL — ABNORMAL LOW (ref 4.22–5.81)
RDW: 19.3 % — ABNORMAL HIGH (ref 11.5–15.5)
WBC: 4.7 10*3/uL (ref 4.0–10.5)
nRBC: 0 % (ref 0.0–0.2)

## 2024-01-29 LAB — MRSA NEXT GEN BY PCR, NASAL: MRSA by PCR Next Gen: NOT DETECTED

## 2024-01-29 LAB — RENAL FUNCTION PANEL
Albumin: 2.2 g/dL — ABNORMAL LOW (ref 3.5–5.0)
Anion gap: 13 (ref 5–15)
BUN: 42 mg/dL — ABNORMAL HIGH (ref 6–20)
CO2: 26 mmol/L (ref 22–32)
Calcium: 8.8 mg/dL — ABNORMAL LOW (ref 8.9–10.3)
Chloride: 96 mmol/L — ABNORMAL LOW (ref 98–111)
Creatinine, Ser: 5.87 mg/dL — ABNORMAL HIGH (ref 0.61–1.24)
GFR, Estimated: 11 mL/min — ABNORMAL LOW (ref >60)
Glucose, Bld: 325 mg/dL — ABNORMAL HIGH (ref 70–99)
Phosphorus: 6.2 mg/dL — ABNORMAL HIGH (ref 2.5–4.6)
Potassium: 4.6 mmol/L (ref 3.5–5.1)
Sodium: 135 mmol/L (ref 135–145)

## 2024-01-29 LAB — GLUCOSE, CAPILLARY
Glucose-Capillary: 342 mg/dL — ABNORMAL HIGH (ref 70–99)
Glucose-Capillary: 316 mg/dL — ABNORMAL HIGH (ref 70–99)
Glucose-Capillary: 333 mg/dL — ABNORMAL HIGH (ref 70–99)
Glucose-Capillary: 339 mg/dL — ABNORMAL HIGH (ref 70–99)
Glucose-Capillary: 241 mg/dL — ABNORMAL HIGH (ref 70–99)

## 2024-01-29 LAB — ECHOCARDIOGRAM COMPLETE
AR max vel: 3.68 cm2
AV Peak grad: 8.1 mmHg
Ao pk vel: 1.43 m/s
Area-P 1/2: 4.49 cm2
Est EF: 40
Height: 66 in
MV M vel: 5.25 m/s
MV Peak grad: 110 mmHg
P 1/2 time: 439 ms
S' Lateral: 3.9 cm
Single Plane A4C EF: 42.3 %
Weight: 1992.96 [oz_av]

## 2024-01-29 LAB — HEPATITIS B SURFACE ANTIBODY, QUANTITATIVE: Hep B S AB Quant (Post): 20.5 m[IU]/mL

## 2024-01-29 MED ORDER — CARVEDILOL 25 MG PO TABS
25.0000 mg | ORAL_TABLET | Freq: Two times a day (BID) | ORAL | 0 refills | Status: AC
  Filled 2024-01-29: qty 60, 30d supply, fill #0

## 2024-01-29 MED ORDER — DOXYCYCLINE HYCLATE 100 MG PO TABS
100.0000 mg | ORAL_TABLET | Freq: Two times a day (BID) | ORAL | 0 refills | Status: DC
  Filled 2024-01-29: qty 10, 5d supply, fill #0

## 2024-01-29 MED ORDER — CEFUROXIME AXETIL 500 MG PO TABS
500.0000 mg | ORAL_TABLET | Freq: Every evening | ORAL | 0 refills | Status: DC
Start: 2024-01-30 — End: 2024-02-03
  Filled 2024-01-29: qty 4, 4d supply, fill #0

## 2024-01-29 MED ORDER — VANCOMYCIN HCL 500 MG/100ML IV SOLN
500.0000 mg | Freq: Once | INTRAVENOUS | Status: DC
  Filled 2024-01-29: qty 100

## 2024-01-29 MED ORDER — OXYMETAZOLINE HCL 0.05 % NA SOLN
2.0000 | Freq: Once | NASAL | Status: AC
  Administered 2024-01-29: 2 via NASAL
  Filled 2024-01-29: qty 30

## 2024-01-29 MED ORDER — MYCOPHENOLATE MOFETIL 250 MG PO CAPS
500.0000 mg | ORAL_CAPSULE | Freq: Two times a day (BID) | ORAL | 0 refills | Status: AC
  Filled 2024-01-29: qty 120, 30d supply, fill #0

## 2024-01-29 MED ORDER — LIDOCAINE-PRILOCAINE 2.5-2.5 % EX CREA: 1.0000 | TOPICAL_CREAM | CUTANEOUS | Status: DC | PRN

## 2024-01-29 MED ORDER — CEFUROXIME AXETIL 250 MG PO TABS: 500.0000 mg | ORAL_TABLET | Freq: Every evening | ORAL | Status: DC

## 2024-01-29 MED ORDER — PERFLUTREN LIPID MICROSPHERE
1.0000 mL | INTRAVENOUS | Status: AC | PRN
  Administered 2024-01-29: 4 mL via INTRAVENOUS

## 2024-01-29 MED ORDER — PENTAFLUOROPROP-TETRAFLUOROETH EX AERO: 1.0000 | INHALATION_SPRAY | CUTANEOUS | Status: DC | PRN

## 2024-01-29 NOTE — Discharge Summary (Incomplete)
 Family Medicine Teaching [facility] Discharge Summary  Patient name: Adam Davis Medical record number: [id number] Date of birth: [birth year] Age: 48 y.o. Gender: male Date of Admission: 01/27/2024  Date of Discharge: 01/30/24  Admitting Physician: Clyda Dark, DO  Primary Care Provider: Farris Hong, MD Consultants: Nephrology  Indication for Hospitalization: Dyspnea  Discharge Diagnoses/Problem List:  Principal Problem for Admission: AHRF Other Problems addressed during stay:  Principal Problem:   Acute hypoxic respiratory failure (HCC) Active Problems:   ESRD (end stage renal disease) on dialysis (HCC)   Chronic health problem   Hypervolemia   Pneumonia of both lungs due to infectious organism  Brief Hospital Course:  Adam Davis is a 48 year old male  with history of ESRD on HD, hypertension, hyperlipidemia, type 2 diabetes, renal transplant (on immunosuppressive therapy) who was admitted for dyspnea.  Acute hypoxic respiratory failure Acute hypoxic respiratory failure requiring supplemental O2.  Recent multifocal pneumonia, with new fever of 101 on admission - exacerbated by suspected pulmonary edema from his ESRD.  CXR revealed worsening bilateral airspace disease.  Initially started on ceftriaxone and doxycycline, then broadened to vancomycin.  MRSA negative and was afebrile for 24 hours, so the biotics were de-escalated to Cefuroxime and Doxycycline to complete a 7-day course on 6/7.  ESRD on HD (Tu/Th/Sat) Nephrology was consulted, patient received HD without complications during his hospital stay.  Patient received an additional session of HD on 6/2 which helped improve his respiratory symptoms.  Advised patient to return to his normally scheduled HD sessions on upon discharge.  Other chronic conditions were medically managed with home medications and formulary alternatives as necessary (type diabetes, HLD, hypertension)  Follow-up  recommendations: Patient should complete his antibiotics on 6/7, both Cefuroxime and Doxycycline.  Ensure patient has returned to regularly scheduled HD sessions. Echo demonstrated EF of 40%, G2DD, IVC dilated w/ <50% respiratory variability. Consider cardiology referral outpatient.  Disposition: Home  Discharge Condition: Stable  Discharge Exam:  Vitals:   01/30/24 0439 01/30/24 0901  BP: (!) 162/96 (!) 163/109  Pulse: 89 87  Resp: 20 18  Temp: 98.7 F (37.1 C) (!) 97.5 F (36.4 C)  SpO2: 100% 100%   General: Awake and Alert in NAD HEENT: NCAT. Sclera anicteric. No rhinorrhea. Cardiovascular: RRR. No M/R/G Respiratory: CTAB, normal WOB on RA. No wheezing, crackles, rhonchi, or diminished breath sounds. Abdomen: Soft, non-tender, non-distended. Bowel sounds normoactive/ Extremities: Able to move all extremities. No BLE edema, no deformities or significant joint findings. Skin: Warm and dry. No abrasions or rashes noted. Neuro: A&Ox3. No focal neurological deficits.  Significant Procedures: none  Significant Labs and Imaging:  Recent Labs  Lab 01/29/24 1222 01/30/24 0547  WBC 4.7 6.6  HGB 7.5* 7.5*  HCT 24.2* 23.8*  PLT 175 217   Recent Labs  Lab 01/29/24 1222 01/30/24 0547  NA 135 134*  K 4.6 4.7  CL 96* 97*  CO2 26 25  GLUCOSE 325* 225*  BUN 42* 61*  CREATININE 5.87* 7.82*  CALCIUM  8.8* 8.9  MG  --  2.0  PHOS 6.2* 5.6*  ALBUMIN 2.2* 2.3*    CXR: Mild worsening of bilateral heterogeneous airspace disease, which may be due to pulmonary edema or atypical infection.   Stable cardiomegaly.  Echo 1. No left ventricular thrombus is seen (Definity contrast was used). Left ventricular ejection fraction, by estimation, is 40%. The left ventricle has mild to moderately decreased function. The left ventricle demonstrates global hypokinesis. There is moderate concentric  left ventricular hypertrophy. Left ventricular diastolic parameters are consistent with Grade II  diastolic dysfunction (pseudonormalization). Elevated left atrial pressure.  2. Right ventricular systolic function is normal. The right ventricular size is normal. There is moderately elevated pulmonary artery systolic pressure. The estimated right ventricular systolic pressure is 59.9 mmHg.   3. Left atrial size was moderately dilated.  4. Right atrial size was moderately dilated.  5. A small pericardial effusion is present. The pericardial effusion is circumferential. There is no evidence of cardiac tamponade.  6. The mitral valve is normal in structure. Moderate mitral valve regurgitation. No evidence of mitral stenosis.  7. The aortic valve is tricuspid. There is mild calcification of the aortic valve. Aortic valve regurgitation is trivial. Aortic valve sclerosis/calcification is present, without any evidence of aortic stenosis.  8. There is borderline dilatation of the aortic root and of the ascending aorta, measuring 38 mm.  9. The inferior vena cava is dilated in size with <50% respiratory variability, suggesting right atrial pressure of 15 mmHg.   Results/Tests Pending at Time of Discharge: none  Discharge Medications:  Allergies as of 01/30/2024       Reactions   Atorvastatin  Other (See Comments)   Myalgia - resolved with discontinuation        Medication List     STOP taking these medications    cyclobenzaprine  5 MG tablet Commonly known as: FLEXERIL        TAKE these medications    amLODipine  10 MG tablet Commonly known as: NORVASC  Take 10 mg by mouth daily.   carvedilol  25 MG tablet Commonly known as: COREG  Take 1 tablet (25 mg total) by mouth 2 (two) times daily with a meal.   cefUROXime 500 MG tablet Commonly known as: CEFTIN Take 1 tablet (500 mg total) by mouth every evening for 4 doses.   Dexcom G7 Sensor Misc Use as directed   doxycycline 100 MG tablet Commonly known as: VIBRA-TABS Take 1 tablet (100 mg total) by mouth every 12 (twelve) hours for 8  doses.   insulin  glargine-yfgn 100 UNIT/ML Pen Commonly known as: SEMGLEE  Inject 10 Units into the skin daily.   Insupen Pen Needles 32G X 4 MM Misc Generic drug: Insulin  Pen Needle Use with Semglee  pen   multivitamin Tabs tablet Take 1 tablet by mouth daily.   mycophenolate  250 MG capsule Commonly known as: CELLCEPT  Take 2 capsules (500 mg total) by mouth 2 (two) times daily.   OneTouch Verio Flex System w/Device Kit Use in the morning, at noon, and at bedtime.   rosuvastatin  10 MG tablet Commonly known as: CRESTOR  Take one tablet every Monday, Wednesday, and Friday.   sevelamer  carbonate 800 MG tablet Commonly known as: Renvela  Take 1 tablet (800 mg total) by mouth 3 (three) times daily with meals.   tacrolimus  1 MG capsule Commonly known as: PROGRAF  Take 1 mg by mouth 2 (two) times daily.   valsartan  320 MG tablet Commonly known as: DIOVAN  Take 1 tablet (320 mg total) by mouth every evening.        Discharge Instructions: Please refer to Patient Instructions section of EMR for full details.  Patient was counseled important signs and symptoms that should prompt return to medical care, changes in medications, dietary instructions, activity restrictions, and follow up appointments.   Follow-Up Appointments:  Follow-up Information     Baloch, Mahnoor, MD Follow up on 02/01/2024.   Specialty: Family Medicine Contact information: [address] [place] [address] [phone]  Clyda Dark, DO 01/30/2024, 1:20 PM PGY-1, [facility]

## 2024-01-29 NOTE — Assessment & Plan Note (Addendum)
 Reinfection vs pulmonary edema per worsening bilateral airspace disease noted on CXR causing dyspnea is most likely due to his immunosuppressed state with history of ESRD on HD with past kidney transplant in 2006.  Afebrile overnight.  Antibiotic coverage was expanded to include vancomycin today, MRSA nares was canceled.  Patient continues to require 2L of [place] to maintain O2 saturations. - Reordered MRSA swab, which was negative, discontinued Vanc (6/2-6/3) - Abx: CTX 2g (6/1-6/3), Cefuroxime (6/4-6/7), and Doxycyline (6/1-6/7) - Echo: EF of 40%, G2DD, IVC dilated w/ <50% respiratory variability; will refer to outpatient cardiology upon discharge - Renal diet  - Continuous pulse ox; ambulate with pulse ox prior to discharge

## 2024-01-29 NOTE — Plan of Care (Signed)

## 2024-01-29 NOTE — Progress Notes (Signed)
 Heart Failure Navigator Progress Note  Assessed for Heart & Vascular TOC clinic readiness.  Patient does not meet criteria due to ESRD on hemodialysis. No HF TOC.   Navigator will sign off at this time.   Randie Bustle, BSN, Scientist, clinical (histocompatibility and immunogenetics) Only

## 2024-01-29 NOTE — Plan of Care (Signed)
 FMTS Interim Progress Note  4:20PM  Evaluated pt at bedside due to ongoing epistaxis. Pt apparently has been intermittently bleeding for the past few hours, passing some clots as well  States bleeding will stop with pressure then after blowing out the clot will start again. Was starting to feel a bit lightheaded too.  On exam pt has visible anterior bleed from R nare, it is not profusely bleeding but rather oozing out slowly  Just received Afrin   Advised pt to hold tight pressure for 15 mins   If still bleeding at that time consider chemical cautery   Update 4:43 PM Reevalauted pt, bleeding stopped Pt does prefer to stay overnight which I think is reasonable Recheck labs in AM, monitor for worsening anemia Sounds like plan is to return to TTS dialysis but can reconsider if lab abnormalities  [name], MD 01/29/2024, 4:20 PM PGY-2, [facility] Family Medicine Service pager [phone]

## 2024-01-29 NOTE — Progress Notes (Signed)
  Echocardiogram 2D Echocardiogram has been performed.  Adam Davis 01/29/2024, 9:53 AM

## 2024-01-29 NOTE — Progress Notes (Signed)
 [facility] Progress Note   Subjective:   Tolerated HD yesterday, now on RA and 1kg below previous EDW. Denies any SOB today. Does have a nose bleed. No CP, dizziness, nausea.   Objective Vitals:   01/28/24 2149 01/29/24 0214 01/29/24 0607 01/29/24 0807  BP: (!) 155/91 (!) 150/88 (!) 152/100 (!) 150/93  Pulse: 90 80 84 86  Resp: 20 20 20 17   Temp: 98.5 F (36.9 C) 97.6 F (36.4 C) 97.7 F (36.5 C)   TempSrc:      SpO2: 100% 100% 97% 100%  Weight:      Height:       Physical Exam General: Alert male in NAD, + nose bleed Heart: RRR, no murmurs, rubs or gallops Lungs: CTA bilaterally, respirations unlabored on RA Abdomen: Soft,non-distended, +BS Extremities: no edema b/l lower extremities Dialysis Access: [facility] [place]  Additional Objective Labs: Basic Metabolic Panel: Recent Labs  Lab 01/27/24 1338 01/28/24 0552  NA 135 135  K 3.8 4.0  CL 93* 96*  CO2 27 26  GLUCOSE 402* 183*  BUN 37* 51*  CREATININE 6.33* 8.13*  CALCIUM  8.5* 8.4*  PHOS  --  6.6*   Liver Function Tests: Recent Labs  Lab 01/28/24 0552  ALBUMIN 2.3*   No results for input(s): "LIPASE", "AMYLASE" in the last 168 hours. CBC: Recent Labs  Lab 01/27/24 1338 01/28/24 0552  WBC 8.6 8.8  NEUTROABS  --  6.2  HGB 7.8* 7.1*  HCT 25.0* 22.7*  MCV 68.1* 67.6*  PLT 228 217   Blood Culture    Component Value Date/Time   SDES BLOOD SITE NOT SPECIFIED 07/31/2023 1935   SDES BLOOD SITE NOT SPECIFIED 07/31/2023 1935   SPECREQUEST  07/31/2023 1935    BOTTLES DRAWN AEROBIC AND ANAEROBIC Blood Culture adequate volume   SPECREQUEST  07/31/2023 1935    BOTTLES DRAWN AEROBIC AND ANAEROBIC Blood Culture adequate volume   CULT  07/31/2023 1935    NO GROWTH 5 DAYS Performed at [facility], [address], [place], [place] [address]    CULT  07/31/2023 1935    NO GROWTH 5 DAYS Performed at [facility], [address] [place], [place] [address]    REPTSTATUS 08/05/2023 FINAL  07/31/2023 1935   REPTSTATUS 08/05/2023 FINAL 07/31/2023 1935    Cardiac Enzymes: No results for input(s): "CKTOTAL", "CKMB", "CKMBINDEX", "TROPONINI" in the last 168 hours. CBG: Recent Labs  Lab 01/28/24 1006 01/28/24 1443 01/28/24 2256 01/29/24 0658 01/29/24 0806  GLUCAP 347* 377* 303* 342* 316*   Iron Studies: No results for input(s): "IRON", "TIBC", "TRANSFERRIN", "FERRITIN" in the last 72 hours. @lablastinr3 @ Studies/Results: DG Chest 2 View Result Date: 01/27/2024 CLINICAL DATA:  Chest pain and shortness of breath.  Fever. EXAM: CHEST - 2 VIEW COMPARISON:  12/24/2023 FINDINGS: Stable moderate cardiomegaly. Right jugular dual-lumen central venous dialysis catheter remains in place. Bilateral heterogeneous airspace disease is seen throughout both lungs with mild worsening since previous study. This may be due to pulmonary edema or atypical infection. No pleural effusion seen. IMPRESSION: Mild worsening of bilateral heterogeneous airspace disease, which may be due to pulmonary edema or atypical infection. Stable cardiomegaly. Electronically Signed   By: Marlyce Sine M.D.   On: 01/27/2024 14:58   Medications:  cefTRIAXone (ROCEPHIN)  IV 2 g (01/29/24 0847)   vancomycin     vancomycin      amLODipine   10 mg Oral Daily   carvedilol   25 mg Oral BID WC   Chlorhexidine   Gluconate Cloth  6 each Topical Q0600   Chlorhexidine  Gluconate Cloth  6 each Topical Q0600   doxycycline  100 mg Oral Q12H   heparin   5,000 Units Subcutaneous Q8H   insulin  aspart  0-6 Units Subcutaneous TID WC   insulin  glargine-yfgn  6 Units Subcutaneous Q24H   irbesartan  300 mg Oral Daily   mycophenolate   500 mg Oral BID   rosuvastatin   10 mg Oral Daily   sevelamer  carbonate  800 mg Oral TID WC   tacrolimus   1 mg Oral BID    Dialysis Orders:  [facility] TTS 4h   B400   57.5kg   TDC   Heparin  none Last OP HD 5/31, post wt 57.8 UF is usually 2-2.5 L max, gets to dry wt  Mircera 225 mcg q 2 wks, last  5/29  Assessment/Plan: SOB: for last few days, CXR and exam suggest vol overload/ pulm edema. Not in distress. Had extra HD yesterday and now below EDW, on RA. Does not want to go to HD today due to late HD last night, ok with me as long as he follows fluid restrictions closely. Also on antibiotics for possible pneumonia.  ESRD: on HD TTS. Has not missed HD. HD as above.  HTN: BP's elevated today, home meds resumed.  Volume: as above.  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 Last OP Hb 8.2, last esa on 5/29. Follow, transfuse prn.  Secondary hyperparathyroidism: Ca in range, Phos 6.6. Resumed renvela  H/o failed renal transplant: back on HD as of Dec 2024, continues taking prograf / cellcept  at low dose.    Ramona Burner, PA-C 01/29/2024, 10:42 AM  [facility] Pager: [phone]

## 2024-01-29 NOTE — Discharge Instructions (Addendum)
 Dear Adam Davis,  Thank you for letting us  participate in your care. You were hospitalized for increased work of breathing likely secondary to pulmonary edema versus reinfection of pneumonia due to your immunosuppressed state. You were treated with antibiotics and additional HD session which helped resolve your symptoms and help you get off of oxygen.  Please make sure to complete your antibiotic regimen, this will be completed on June 7.  Doxycycline should be taken twice a day and cefuroxime should be taken daily.  We recommend you attend your regularly scheduled HD sessions as this will help with your symptoms as well.  Please be compliant with your fluid restriction.  Please attend your appointment with Dr. Derril Davis on 6/6 at 11:15 AM.  POST-HOSPITAL & CARE INSTRUCTIONS Complete antibiotic course Go to your follow up appointments (listed below)  DOCTOR'S APPOINTMENT   Future Appointments  Date Time Provider Department Center  02/01/2024 11:15 AM Farris Hong, MD [facility]  02/14/2024  9:30 AM [facility] 9 [facility] H&V  02/14/2024 10:30 AM [facility] H&V  02/15/2024 11:00 AM Koval, Peter G, RPH-CPP [facility]    Take care and be well!  Family Medicine Teaching Service Inpatient Team [place]  [facility]  [address] [place], [place] [address] [phone]

## 2024-01-29 NOTE — Progress Notes (Signed)
 Daily Progress Note Intern Pager: [phone]  Patient name: Adam Davis Medical record number: [id number] Date of birth: 01-10-1976 Age: 48 y.o. Gender: male  Primary Care Provider: Farris Hong, MD Consultants: Nephrology Code Status: Full code  Pt Overview and Major Events to Date:  6/1: Admitted 6/2: HD  Assessment and Plan: [name] is a 48 year old male with PMH of ESRD on HD s/p failed renal transplant, T2DM, HLD, HTN presenting with dyspnea likely secondary to pulmonary edema versus possible reinfection of pneumonia due to immunosuppressed state.  Patient was treated with an additional HD session yesterday with improvement of his symptoms.  Currently being treated for pneumonia with broad-spectrum antibiotics as well. Assessment & Plan Acute hypoxic respiratory failure (HCC) Reinfection vs pulmonary edema per worsening bilateral airspace disease noted on CXR causing dyspnea is most likely due to his immunosuppressed state with history of ESRD on HD with past kidney transplant in 2006.  Afebrile overnight.  Antibiotic coverage was expanded to include vancomycin today, MRSA nares was canceled.  Patient continues to require 2L of [place] to maintain O2 saturations. - Reordered MRSA swab, which was negative, discontinued Vanc (6/2-6/3) - Abx: CTX 2g (6/1-6/3), Cefuroxime (6/4-6/7), and Doxycyline (6/1-6/7) - Echo: EF of 40%, G2DD, IVC dilated w/ <50% respiratory variability; will refer to outpatient cardiology upon discharge - Renal diet  - Continuous pulse ox; ambulate with pulse ox prior to discharge ESRD (end stage renal disease) on dialysis (HCC) HD Tu/Th/Sat.  Last HD session yesterday, tolerated well aside from cramping after. - Patient declined regularly scheduled HD today, nephrology agreeable and agrees that patient may be ready for discharge - Nephrology on board, appreciate recommendations Chronic health problem T2DM: A1c 6.8 in Dec 2024, on Semglee  10 units  daily in AM. Repeat A1c here. Very sensitive SSI while inpatient HLD: Rosuvastatin  10 mg MWF HTN: Continue home regimen with formulary alternative - Valsartan  320 mg daily, Carvedilol  25 BID, and Amlodipine  10 mg daily ESRD w/ hx of renal transplant Sept 2006, now back on HD Tu/Th/Sat: Continue Tacrolimus  1 mg BID, awaiting nephro recs for additional medications  FEN/GI: Renal diet PPx: Heparin  Dispo:Home pending clinical improvement . Barriers include oxygen requirement.   Subjective:  Patient is doing well today.  His work of breathing has improved.  No longer has conversational dyspnea.  His off his oxygen requirement and saturating well on room air.  States that the incentive spirometer is helping as well.  Objective: Temp:  [97.6 F (36.4 C)-99.6 F (37.6 C)] 97.7 F (36.5 C) (06/03 0607) Pulse Rate:  [80-90] 86 (06/03 0807) Resp:  [17-34] 17 (06/03 0807) BP: (138-155)/(83-100) 150/93 (06/03 0807) SpO2:  [97 %-100 %] 100 % (06/03 0807) Weight:  [56.5 kg-58.8 kg] 56.5 kg (06/02 1916) Physical Exam: General: Awake and Alert in NAD HEENT: NCAT. Sclera anicteric. No rhinorrhea. Cardiovascular: RRR. No M/R/G Respiratory: Mildly diminished breath sounds in RLL otherwise good aeration, normal WOB on RA. No wheezing, crackles, rhonchi, or diminished breath sounds. Abdomen: Soft, non-tender, non-distended. Bowel sounds normoactive/ Extremities: Able to move all extremities. No BLE edema, no deformities or significant joint findings. Skin: Warm and dry. No abrasions or rashes noted. Neuro: A&Ox3. No focal neurological deficits.  Laboratory: Most recent CBC Lab Results  Component Value Date   WBC 4.7 01/29/2024   HGB 7.5 (L) 01/29/2024   HCT 24.2 (L) 01/29/2024   MCV 67.8 (L) 01/29/2024   PLT 175 01/29/2024   Most recent BMP    Latest  Ref Rng & Units 01/29/2024   12:22 PM  BMP  Glucose 70 - 99 mg/dL 119   BUN 6 - 20 mg/dL 42   Creatinine 1.47 - 1.24 mg/dL 8.29   Sodium 562 -  130 mmol/L 135   Potassium 3.5 - 5.1 mmol/L 4.6   Chloride 98 - 111 mmol/L 96   CO2 22 - 32 mmol/L 26   Calcium  8.9 - 10.3 mg/dL 8.8     Imaging/Diagnostic Tests: Echo: EF of 40%, G2DD, IVC dilated w/ <50% respiratory variability.  Clyda Dark, DO 01/29/2024, 1:49 PM  PGY-1, [facility] Family [facility] FPTS Intern pager: [phone], text pages welcome Secure chat group [facility] Teaching Service

## 2024-01-29 NOTE — Assessment & Plan Note (Addendum)
 HD Tu/Th/Sat.  Last HD session yesterday, tolerated well aside from cramping after. - Patient declined regularly scheduled HD today, nephrology agreeable and agrees that patient may be ready for discharge - Nephrology on board, appreciate recommendations

## 2024-01-29 NOTE — Assessment & Plan Note (Addendum)
 T2DM: A1c 6.8 in Dec 2024, on Semglee  10 units daily in AM. Repeat A1c here. Very sensitive SSI while inpatient HLD: Rosuvastatin  10 mg MWF HTN: Continue home regimen with formulary alternative - Valsartan  320 mg daily, Carvedilol  25 BID, and Amlodipine  10 mg daily ESRD w/ hx of renal transplant Sept 2006, now back on HD Tu/Th/Sat: Continue Tacrolimus  1 mg BID, awaiting nephro recs for additional medications

## 2024-01-30 ENCOUNTER — Other Ambulatory Visit (HOSPITAL_COMMUNITY): Payer: Self-pay

## 2024-01-30 DIAGNOSIS — N186 End stage renal disease: Secondary | ICD-10-CM | POA: Diagnosis not present

## 2024-01-30 DIAGNOSIS — I5022 Chronic systolic (congestive) heart failure: Secondary | ICD-10-CM

## 2024-01-30 DIAGNOSIS — E8779 Other fluid overload: Secondary | ICD-10-CM | POA: Diagnosis not present

## 2024-01-30 DIAGNOSIS — Z992 Dependence on renal dialysis: Secondary | ICD-10-CM | POA: Diagnosis not present

## 2024-01-30 LAB — RENAL FUNCTION PANEL
Albumin: 2.3 g/dL — ABNORMAL LOW (ref 3.5–5.0)
Anion gap: 12 (ref 5–15)
BUN: 61 mg/dL — ABNORMAL HIGH (ref 6–20)
CO2: 25 mmol/L (ref 22–32)
Calcium: 8.9 mg/dL (ref 8.9–10.3)
Chloride: 97 mmol/L — ABNORMAL LOW (ref 98–111)
Creatinine, Ser: 7.82 mg/dL — ABNORMAL HIGH (ref 0.61–1.24)
GFR, Estimated: 8 mL/min — ABNORMAL LOW (ref >60)
Glucose, Bld: 225 mg/dL — ABNORMAL HIGH (ref 70–99)
Phosphorus: 5.6 mg/dL — ABNORMAL HIGH (ref 2.5–4.6)
Potassium: 4.7 mmol/L (ref 3.5–5.1)
Sodium: 134 mmol/L — ABNORMAL LOW (ref 135–145)

## 2024-01-30 LAB — CBC
HCT: 23.8 % — ABNORMAL LOW (ref 39.0–52.0)
Hemoglobin: 7.5 g/dL — ABNORMAL LOW (ref 13.0–17.0)
MCH: 21 pg — ABNORMAL LOW (ref 26.0–34.0)
MCHC: 31.5 g/dL (ref 30.0–36.0)
MCV: 66.7 fL — ABNORMAL LOW (ref 80.0–100.0)
Platelets: 217 10*3/uL (ref 150–400)
RBC: 3.57 MIL/uL — ABNORMAL LOW (ref 4.22–5.81)
RDW: 19.4 % — ABNORMAL HIGH (ref 11.5–15.5)
WBC: 6.6 10*3/uL (ref 4.0–10.5)
nRBC: 0 % (ref 0.0–0.2)

## 2024-01-30 LAB — MAGNESIUM: Magnesium: 2 mg/dL (ref 1.7–2.4)

## 2024-01-30 LAB — GLUCOSE, CAPILLARY: Glucose-Capillary: 239 mg/dL — ABNORMAL HIGH (ref 70–99)

## 2024-01-30 MED ORDER — DOXYCYCLINE HYCLATE 100 MG PO TABS
100.0000 mg | ORAL_TABLET | Freq: Two times a day (BID) | ORAL | 0 refills | Status: AC
  Filled 2024-01-30: qty 8, 4d supply, fill #0

## 2024-01-30 MED ORDER — CEFUROXIME AXETIL 500 MG PO TABS
500.0000 mg | ORAL_TABLET | Freq: Every evening | ORAL | 0 refills | Status: AC
  Filled 2024-01-31: qty 4, 4d supply, fill #0

## 2024-01-30 NOTE — Progress Notes (Signed)
 [facility] Progress Note   Subjective:   Stayed overnight due to nosebleed, which has now stopped. On RA, denies SOB, CP, dizziness, nausea.   Objective Vitals:   01/29/24 1710 01/29/24 2007 01/30/24 0439 01/30/24 0901  BP: (!) 154/99 (!) 151/95 (!) 162/96 (!) 163/109  Pulse: 83 86 89 87  Resp:  18 20 18   Temp:  98.1 F (36.7 C) 98.7 F (37.1 C) (!) 97.5 F (36.4 C)  TempSrc:  Oral Oral   SpO2: 100% 100% 100% 100%  Weight:      Height:       Physical Exam General: Alert male in NAD Heart: RRR, no murmurs, rubs or gallops Lungs: CTA bilaterally, respirations unlabored on RA Abdomen: Soft,non-distended, +BS Extremities: no edema b/l lower extremities Dialysis Access: TDC, RUE AVF + t/b  Additional Objective Labs: Basic Metabolic Panel: Recent Labs  Lab 01/28/24 0552 01/29/24 1222 01/30/24 0547  NA 135 135 134*  K 4.0 4.6 4.7  CL 96* 96* 97*  CO2 26 26 25   GLUCOSE 183* 325* 225*  BUN 51* 42* 61*  CREATININE 8.13* 5.87* 7.82*  CALCIUM  8.4* 8.8* 8.9  PHOS 6.6* 6.2* 5.6*   Liver Function Tests: Recent Labs  Lab 01/28/24 0552 01/29/24 1222 01/30/24 0547  ALBUMIN 2.3* 2.2* 2.3*   No results for input(s): "LIPASE", "AMYLASE" in the last 168 hours. CBC: Recent Labs  Lab 01/27/24 1338 01/28/24 0552 01/29/24 1222 01/30/24 0547  WBC 8.6 8.8 4.7 6.6  NEUTROABS  --  6.2  --   --   HGB 7.8* 7.1* 7.5* 7.5*  HCT 25.0* 22.7* 24.2* 23.8*  MCV 68.1* 67.6* 67.8* 66.7*  PLT 228 217 175 217   Blood Culture    Component Value Date/Time   SDES BLOOD SITE NOT SPECIFIED 07/31/2023 1935   SDES BLOOD SITE NOT SPECIFIED 07/31/2023 1935   SPECREQUEST  07/31/2023 1935    BOTTLES DRAWN AEROBIC AND ANAEROBIC Blood Culture adequate volume   SPECREQUEST  07/31/2023 1935    BOTTLES DRAWN AEROBIC AND ANAEROBIC Blood Culture adequate volume   CULT  07/31/2023 1935    NO GROWTH 5 DAYS Performed at [facility], [address], [place], [place]  [address]    CULT  07/31/2023 1935    NO GROWTH 5 DAYS Performed at [facility], [address] [place], [place] [address]    REPTSTATUS 08/05/2023 FINAL 07/31/2023 1935   REPTSTATUS 08/05/2023 FINAL 07/31/2023 1935    Cardiac Enzymes: No results for input(s): "CKTOTAL", "CKMB", "CKMBINDEX", "TROPONINI" in the last 168 hours. CBG: Recent Labs  Lab 01/29/24 0806 01/29/24 1201 01/29/24 1711 01/29/24 2018 01/30/24 0859  GLUCAP 316* 333* 339* 241* 239*   Iron Studies: No results for input(s): "IRON", "TIBC", "TRANSFERRIN", "FERRITIN" in the last 72 hours. @lablastinr3 @ Studies/Results: ECHOCARDIOGRAM COMPLETE Result Date: 01/29/2024    ECHOCARDIOGRAM REPORT   Patient Name:   STAFFORD RIVIERA Date of Exam: 01/29/2024 Medical Rec #:  [id number]         Height:       66.0 in Accession #:    [id number]        Weight:       124.6 lb Date of Birth:  [birth year]          BSA:          1.635 m Patient Age:    48 years          BP:  150/93 mmHg Patient Gender: M                 HR:           85 bpm. Exam Location:  Inpatient Procedure: 2D Echo, Cardiac Doppler, Color Doppler and Intracardiac            Opacification Agent (Both Spectral and Color Flow Doppler were            utilized during procedure). Indications:    CHF  History:        Patient has no prior history of Echocardiogram examinations.                 Signs/Symptoms:Dyspnea; Risk Factors:Dyslipidemia.  Sonographer:    Terrilee Few RCS Referring Phys: [id number] TODD D MCDIARMID IMPRESSIONS  1. No left ventricular thrombus is seen (Definity contrast was used). Left ventricular ejection fraction, by estimation, is 40%. The left ventricle has mild to moderately decreased function. The left ventricle demonstrates global hypokinesis. There is moderate concentric left ventricular hypertrophy. Left ventricular diastolic parameters are consistent with Grade II diastolic dysfunction (pseudonormalization). Elevated left atrial pressure.  2.  Right ventricular systolic function is normal. The right ventricular size is normal. There is moderately elevated pulmonary artery systolic pressure. The estimated right ventricular systolic pressure is 59.9 mmHg.  3. Left atrial size was moderately dilated.  4. Right atrial size was moderately dilated.  5. A small pericardial effusion is present. The pericardial effusion is circumferential. There is no evidence of cardiac tamponade.  6. The mitral valve is normal in structure. Moderate mitral valve regurgitation. No evidence of mitral stenosis.  7. The aortic valve is tricuspid. There is mild calcification of the aortic valve. Aortic valve regurgitation is trivial. Aortic valve sclerosis/calcification is present, without any evidence of aortic stenosis.  8. There is borderline dilatation of the aortic root and of the ascending aorta, measuring 38 mm.  9. The inferior vena cava is dilated in size with <50% respiratory variability, suggesting right atrial pressure of 15 mmHg. FINDINGS  Left Ventricle: No left ventricular thrombus is seen (Definity contrast was used). Left ventricular ejection fraction, by estimation, is 40%. The left ventricle has mild to moderately decreased function. The left ventricle demonstrates global hypokinesis. Definity contrast agent was given IV to delineate the left ventricular endocardial borders. The left ventricular internal cavity size was normal in size. There is moderate concentric left ventricular hypertrophy. Left ventricular diastolic parameters are consistent with Grade II diastolic dysfunction (pseudonormalization). Elevated left atrial pressure. Right Ventricle: The right ventricular size is normal. No increase in right ventricular wall thickness. Right ventricular systolic function is normal. There is moderately elevated pulmonary artery systolic pressure. The tricuspid regurgitant velocity is 3.35 m/s, and with an assumed right atrial pressure of 15 mmHg, the estimated right  ventricular systolic pressure is 59.9 mmHg. Left Atrium: Left atrial size was moderately dilated. Right Atrium: Right atrial size was moderately dilated. Pericardium: A small pericardial effusion is present. The pericardial effusion is circumferential. There is no evidence of cardiac tamponade. Mitral Valve: The mitral valve is normal in structure. Moderate mitral valve regurgitation, with centrally-directed jet. No evidence of mitral valve stenosis. Tricuspid Valve: The tricuspid valve is normal in structure. Tricuspid valve regurgitation is mild. Aortic Valve: The aortic valve is tricuspid. There is mild calcification of the aortic valve. Aortic valve regurgitation is trivial. Aortic regurgitation PHT measures 439 msec. Aortic valve sclerosis/calcification is present, without any evidence of aortic stenosis. Aortic valve  peak gradient measures 8.1 mmHg. Pulmonic Valve: The pulmonic valve was normal in structure. Pulmonic valve regurgitation is trivial. No evidence of pulmonic stenosis. Aorta: The aortic root is normal in size and structure. There is borderline dilatation of the aortic root and of the ascending aorta, measuring 38 mm. Venous: The inferior vena cava is dilated in size with less than 50% respiratory variability, suggesting right atrial pressure of 15 mmHg. IAS/Shunts: There is right bowing of the interatrial septum, suggestive of elevated left atrial pressure. No atrial level shunt detected by color flow Doppler. Additional Comments: A venous catheter is visualized in the right atrium.  LEFT VENTRICLE PLAX 2D LVIDd:         4.90 cm      Diastology LVIDs:         3.90 cm      LV e' medial:    6.74 cm/s LV PW:         1.40 cm      LV E/e' medial:  15.1 LV IVS:        1.50 cm      LV e' lateral:   9.57 cm/s LVOT diam:     2.40 cm      LV E/e' lateral: 10.7 LV SV:         92 LV SV Index:   56 LVOT Area:     4.52 cm  LV Volumes (MOD) LV vol d, MOD A4C: 238.7 ml LV vol s, MOD A4C: 137.8 ml LV SV MOD A4C:      101.1 ml RIGHT VENTRICLE             IVC RV S prime:     12.50 cm/s  IVC diam: 2.30 cm TAPSE (M-mode): 2.0 cm LEFT ATRIUM              Index        RIGHT ATRIUM           Index LA diam:        5.17 cm  3.16 cm/m   RA Area:     23.60 cm LA Vol (A2C):   137.0 ml 83.78 ml/m  RA Volume:   75.20 ml  45.99 ml/m LA Vol (A4C):   137.0 ml 83.78 ml/m LA Biplane Vol: 147.0 ml 89.89 ml/m  AORTIC VALVE AV Area (Vmax): 3.68 cm AV Vmax:        142.50 cm/s AV Peak Grad:   8.1 mmHg LVOT Vmax:      116.00 cm/s LVOT Vmean:     71.500 cm/s LVOT VTI:       0.203 m AI PHT:         439 msec  AORTA Ao Root diam: 3.70 cm Ao Asc diam:  3.80 cm MITRAL VALVE                TRICUSPID VALVE MV Area (PHT): 4.49 cm     TR Peak grad:   44.9 mmHg MV Decel Time: 169 msec     TR Vmax:        335.00 cm/s MR Peak grad: 110.0 mmHg MR Vmax:      524.50 cm/s   SHUNTS MV E velocity: 102.00 cm/s  Systemic VTI:  0.20 m MV A velocity: 47.70 cm/s   Systemic Diam: 2.40 cm MV E/A ratio:  2.14 Mihai Croitoru MD Electronically signed by Luana Rumple MD Signature Date/Time: 01/29/2024/1:24:39 PM    Final    Medications:   amLODipine   10  mg Oral Daily   carvedilol   25 mg Oral BID WC   cefUROXime  500 mg Oral QPM   Chlorhexidine  Gluconate Cloth  6 each Topical Q0600   Chlorhexidine  Gluconate Cloth  6 each Topical Q0600   doxycycline  100 mg Oral Q12H   heparin   5,000 Units Subcutaneous Q8H   insulin  aspart  0-6 Units Subcutaneous TID WC   insulin  glargine-yfgn  6 Units Subcutaneous Q24H   irbesartan  300 mg Oral Daily   mycophenolate   500 mg Oral BID   rosuvastatin   10 mg Oral Daily   sevelamer  carbonate  800 mg Oral TID WC   tacrolimus   1 mg Oral BID    Dialysis Orders: [facility] TTS 4h   B400   57.5kg   TDC   Heparin  none Last OP HD 5/31, post wt 57.8 UF is usually 2-2.5 L max, gets to dry wt  Mircera 225 mcg q 2 wks, last 5/29    Assessment/Plan: SOB: for last few days, CXR and exam suggest vol overload/ pulm edema. Not in  distress. Had extra HD yesterday and now below EDW, on RA. Declined extra HD yesterday/today, sats 98% on RA,  ok to wait until Thursday for next HD as long as he follows fluid restrictions closely. Also on antibiotics for possible pneumonia.  ESRD: on HD TTS. Has not missed HD. HD as above.  HTN: BP's elevated, home meds resumed.  Volume: as above.  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 Last OP Hb 8.2, last esa on 5/29. Follow, transfuse prn.  Secondary hyperparathyroidism: Ca in range, Phos 5.6. Resumed renvela  H/o failed renal transplant: back on HD as of Dec 2024, continues taking prograf / cellcept  at low dose.      Ramona Burner, PA-C 01/30/2024, 9:16 AM  [facility] Pager: [phone]

## 2024-01-30 NOTE — Plan of Care (Signed)

## 2024-01-31 ENCOUNTER — Other Ambulatory Visit (HOSPITAL_COMMUNITY): Payer: Self-pay

## 2024-01-31 DIAGNOSIS — D689 Coagulation defect, unspecified: Secondary | ICD-10-CM | POA: Diagnosis not present

## 2024-01-31 DIAGNOSIS — T8249XA Other complication of vascular dialysis catheter, initial encounter: Secondary | ICD-10-CM | POA: Diagnosis not present

## 2024-01-31 DIAGNOSIS — N2581 Secondary hyperparathyroidism of renal origin: Secondary | ICD-10-CM | POA: Diagnosis not present

## 2024-01-31 DIAGNOSIS — N186 End stage renal disease: Secondary | ICD-10-CM | POA: Diagnosis not present

## 2024-01-31 DIAGNOSIS — E1122 Type 2 diabetes mellitus with diabetic chronic kidney disease: Secondary | ICD-10-CM | POA: Diagnosis not present

## 2024-01-31 DIAGNOSIS — Z992 Dependence on renal dialysis: Secondary | ICD-10-CM | POA: Diagnosis not present

## 2024-01-31 NOTE — Discharge Planning (Signed)
 [facility] Patient Discharge Orders- [facility] CLINIC: [facility]  Patient's name: Adam Davis Admit/DC Dates: 01/27/2024 - 01/30/2024  Discharge Diagnoses: Acute hypoxic respiratory failure    Multifocal pneumonia  Aranesp : Given: no   Date and amount of last dose: N/A  Last Hgb: 7.5 PRBC's Given: o Date/# of units: N/A ESA dose for discharge: same dose IV Iron dose at discharge: same dose  Heparin  change: no  EDW Change: Yes New EDW: 56.5kg  [place] Change: no  Access intervention/Change: no Details:  Hectorol/Calcitriol  change: no  Discharge Labs: Calcium  8.9 Phosphorus 5.6 Albumin 2.3 K+ 4.7  IV Antibiotics: no Details:  On Coumadin?: no Last INR: Next INR: Managed By:   OTHER/APPTS/LAB ORDERS:    D/C Meds to be reconciled by nurse after every discharge.  Completed By: Ramona Burner, PA-C 01/31/2024, 8:19 AM  [facility] Pager: [phone]    Reviewed by: MD:______ RN_______

## 2024-02-01 ENCOUNTER — Telehealth: Payer: Self-pay | Admitting: Physician Assistant

## 2024-02-01 ENCOUNTER — Ambulatory Visit (INDEPENDENT_AMBULATORY_CARE_PROVIDER_SITE_OTHER): Admitting: Family Medicine

## 2024-02-01 ENCOUNTER — Encounter: Payer: Self-pay | Admitting: Family Medicine

## 2024-02-01 VITALS — BP 160/90 | HR 92 | Temp 98.5°F | Ht 66.0 in | Wt 129.0 lb

## 2024-02-01 DIAGNOSIS — Z1331 Encounter for screening for depression: Secondary | ICD-10-CM | POA: Diagnosis not present

## 2024-02-01 DIAGNOSIS — I5022 Chronic systolic (congestive) heart failure: Secondary | ICD-10-CM | POA: Diagnosis not present

## 2024-02-01 DIAGNOSIS — J189 Pneumonia, unspecified organism: Secondary | ICD-10-CM

## 2024-02-01 NOTE — Patient Instructions (Addendum)
 It was wonderful to see you today.  Please bring ALL of your medications with you to every visit.   Today we talked about:  Im glad you're feeling better. Please finish your full course of antibiotics as prescribed.  Please await a call from cardiology to see you regarding your Echo results showing worsening function of your heart.  The number for the suicide prevention hotline is 988. You can also visit their website at 988lifeline.org and chat with anyone at any time.   Thank you for choosing [facility].   Please call [phone] with any questions about today's appointment.  Please arrive at least 15 minutes prior to your scheduled appointments.   If you had blood work today, I will send you a MyChart message or a letter if results are normal. Otherwise, I will give you a call.   If you had a referral placed, they will call you to set up an appointment. Please give us  a call if you don't hear back in the next 2 weeks.   If you need additional refills before your next appointment, please call your pharmacy first.   You should follow up in our clinic in [place] in about 4 weeks (around 02/29/2024) for regular follow up .  [name], MD Family Medicine

## 2024-02-01 NOTE — Telephone Encounter (Signed)
 Transition of care contact from inpatient facility  Date of Discharge: 01/30/24 Date of Contact: 02/20/24 Method of contact: Phone  Attempted to contact patient to discuss transition of care from inpatient admission. Patient did not answer the phone and name on voicemail did not match patient's name, so no message left. Will continue to attempt to reach the patient.  Ramona Burner, PA-C 02/01/2024, 1:34 PM  [facility] Pager: [phone]

## 2024-02-01 NOTE — Progress Notes (Signed)
    SUBJECTIVE:   CHIEF COMPLAINT / HPI:   Patient is here for hospital follow up. Patient has history of ESRD on dialysis, DMII, HLD, and HTN.   Brief Course of Illness: Patient with initial diagnosis of pneumonia on 4/30. Patient was treated with antibiotics and felt better but still experiencing some SOB when seeing provider on 5/7. Patient presented to ED on 6/1 with ongoing shortness of breath and fever. CXR showed worsening bilateral airspace disease. Patient was treated with broad spectrum antibiotics (doxycycline, ceftriaxone, and vancomycin) and de-escalated to cefuroxime and doxycycline for a total 7 day course to end 6/7. Patient remained afebrile for 24 hours and then was discharged in stable condition. Patient also received additional HD session during hospital stay as there was concern for possible volume overload contributing to clinical presentation.   Since then, feeling better. Feeling shortness of breath is much improved. No fevers or chills at home. Had dialysis yesterday. Believes he is tolerating dialysis better but still tired after sessions. Overall, feels improved since discharge. Was unable to pick up antibiotics until yesterday. He has continued taking these and plans to finish as prescribed.    Positive PHQ-9 question 9: Patient reports he did have some thoughts of ending his life when he first had his renal transplant but has not had any of those thoughts since, he just did not read the question properly. Reports he still has times he gets down but denies any current suicidal ideations or plans.   PERTINENT  PMH / PSH:  ESRD on dialysis DMII HLD HTN   OBJECTIVE:   BP (!) 160/90   Pulse 92   Temp 98.5 F (36.9 C)   Ht 5\' 6"  (1.676 m)   Wt 129 lb (58.5 kg)   SpO2 96%   BMI 20.82 kg/m     [employer] Visit from 02/01/2024 in [facility] [place]  PHQ-9 Total Score 6     General: A&O,  NAD HEENT: No sign of trauma, EOM grossly intact Cardiac: RRR, S3 gallop Respiratory: CTAB, normal WOB, no w/c/r GI: Soft, NTTP, non-distended  Extremities: Fistula site clean and without any signs of infection  Neuro: Normal gait, moves all four extremities appropriately. Psych: Appropriate mood and affect   ASSESSMENT/PLAN:   Assessment & Plan Positive screening for depression on 9-item Patient Health Questionnaire (PHQ-9) Patient with PHQ of 6 indicating mild depression, but patient had positive answer to question 9. Discussed with patient who indicated history of suicidal ideations but none currently. Reports he has a good support system with his church. Discussed safety planning with patient and provided phone number for suicide prevention hotline and website.  Multifocal pneumonia Patient SOB is much improved since discharge, though is still feeling tired. Pulse ox with SpO2 of 96%. Lungs CTAB. Patient afebrile. Discussed return precautions and red flag symptoms. Patient expressed understanding. Patient to finish full course of antibiotics with cefuroxime and doxycycline, should end tomorrow.   Systolic dysfunction Patients recent echo during hospitalization w reduced EF of 40%. Given his recent symptoms as well as complicated history of ESRD on dialysis, will refer to cardiology for further evaluation and management.    [name], MD [facility]

## 2024-02-02 DIAGNOSIS — D689 Coagulation defect, unspecified: Secondary | ICD-10-CM | POA: Diagnosis not present

## 2024-02-02 DIAGNOSIS — E1122 Type 2 diabetes mellitus with diabetic chronic kidney disease: Secondary | ICD-10-CM | POA: Diagnosis not present

## 2024-02-02 DIAGNOSIS — N2581 Secondary hyperparathyroidism of renal origin: Secondary | ICD-10-CM | POA: Diagnosis not present

## 2024-02-02 DIAGNOSIS — Z992 Dependence on renal dialysis: Secondary | ICD-10-CM | POA: Diagnosis not present

## 2024-02-02 DIAGNOSIS — T8249XA Other complication of vascular dialysis catheter, initial encounter: Secondary | ICD-10-CM | POA: Diagnosis not present

## 2024-02-02 DIAGNOSIS — N186 End stage renal disease: Secondary | ICD-10-CM | POA: Diagnosis not present

## 2024-02-05 DIAGNOSIS — E1122 Type 2 diabetes mellitus with diabetic chronic kidney disease: Secondary | ICD-10-CM | POA: Diagnosis not present

## 2024-02-05 DIAGNOSIS — D689 Coagulation defect, unspecified: Secondary | ICD-10-CM | POA: Diagnosis not present

## 2024-02-05 DIAGNOSIS — Z992 Dependence on renal dialysis: Secondary | ICD-10-CM | POA: Diagnosis not present

## 2024-02-05 DIAGNOSIS — R52 Pain, unspecified: Secondary | ICD-10-CM | POA: Diagnosis not present

## 2024-02-05 DIAGNOSIS — T8249XA Other complication of vascular dialysis catheter, initial encounter: Secondary | ICD-10-CM | POA: Diagnosis not present

## 2024-02-05 DIAGNOSIS — N186 End stage renal disease: Secondary | ICD-10-CM | POA: Diagnosis not present

## 2024-02-05 DIAGNOSIS — N2581 Secondary hyperparathyroidism of renal origin: Secondary | ICD-10-CM | POA: Diagnosis not present

## 2024-02-07 ENCOUNTER — Other Ambulatory Visit (HOSPITAL_COMMUNITY): Payer: Self-pay

## 2024-02-07 DIAGNOSIS — D689 Coagulation defect, unspecified: Secondary | ICD-10-CM | POA: Diagnosis not present

## 2024-02-07 DIAGNOSIS — R52 Pain, unspecified: Secondary | ICD-10-CM | POA: Diagnosis not present

## 2024-02-07 DIAGNOSIS — E1122 Type 2 diabetes mellitus with diabetic chronic kidney disease: Secondary | ICD-10-CM | POA: Diagnosis not present

## 2024-02-07 DIAGNOSIS — N186 End stage renal disease: Secondary | ICD-10-CM | POA: Diagnosis not present

## 2024-02-07 DIAGNOSIS — N2581 Secondary hyperparathyroidism of renal origin: Secondary | ICD-10-CM | POA: Diagnosis not present

## 2024-02-07 DIAGNOSIS — T8249XA Other complication of vascular dialysis catheter, initial encounter: Secondary | ICD-10-CM | POA: Diagnosis not present

## 2024-02-07 DIAGNOSIS — Z992 Dependence on renal dialysis: Secondary | ICD-10-CM | POA: Diagnosis not present

## 2024-02-09 DIAGNOSIS — N186 End stage renal disease: Secondary | ICD-10-CM | POA: Diagnosis not present

## 2024-02-09 DIAGNOSIS — D689 Coagulation defect, unspecified: Secondary | ICD-10-CM | POA: Diagnosis not present

## 2024-02-09 DIAGNOSIS — E1122 Type 2 diabetes mellitus with diabetic chronic kidney disease: Secondary | ICD-10-CM | POA: Diagnosis not present

## 2024-02-09 DIAGNOSIS — Z992 Dependence on renal dialysis: Secondary | ICD-10-CM | POA: Diagnosis not present

## 2024-02-09 DIAGNOSIS — R52 Pain, unspecified: Secondary | ICD-10-CM | POA: Diagnosis not present

## 2024-02-09 DIAGNOSIS — N2581 Secondary hyperparathyroidism of renal origin: Secondary | ICD-10-CM | POA: Diagnosis not present

## 2024-02-09 DIAGNOSIS — T8249XA Other complication of vascular dialysis catheter, initial encounter: Secondary | ICD-10-CM | POA: Diagnosis not present

## 2024-02-12 DIAGNOSIS — T8249XA Other complication of vascular dialysis catheter, initial encounter: Secondary | ICD-10-CM | POA: Diagnosis not present

## 2024-02-12 DIAGNOSIS — N2581 Secondary hyperparathyroidism of renal origin: Secondary | ICD-10-CM | POA: Diagnosis not present

## 2024-02-12 DIAGNOSIS — Z992 Dependence on renal dialysis: Secondary | ICD-10-CM | POA: Diagnosis not present

## 2024-02-12 DIAGNOSIS — N186 End stage renal disease: Secondary | ICD-10-CM | POA: Diagnosis not present

## 2024-02-14 ENCOUNTER — Ambulatory Visit (HOSPITAL_COMMUNITY)

## 2024-02-14 ENCOUNTER — Ambulatory Visit

## 2024-02-14 DIAGNOSIS — N2581 Secondary hyperparathyroidism of renal origin: Secondary | ICD-10-CM | POA: Diagnosis not present

## 2024-02-14 DIAGNOSIS — N186 End stage renal disease: Secondary | ICD-10-CM | POA: Diagnosis not present

## 2024-02-14 DIAGNOSIS — T8249XA Other complication of vascular dialysis catheter, initial encounter: Secondary | ICD-10-CM | POA: Diagnosis not present

## 2024-02-14 DIAGNOSIS — Z992 Dependence on renal dialysis: Secondary | ICD-10-CM | POA: Diagnosis not present

## 2024-02-15 ENCOUNTER — Ambulatory Visit: Admitting: Pharmacist

## 2024-02-16 DIAGNOSIS — N2581 Secondary hyperparathyroidism of renal origin: Secondary | ICD-10-CM | POA: Diagnosis not present

## 2024-02-16 DIAGNOSIS — Z992 Dependence on renal dialysis: Secondary | ICD-10-CM | POA: Diagnosis not present

## 2024-02-16 DIAGNOSIS — N186 End stage renal disease: Secondary | ICD-10-CM | POA: Diagnosis not present

## 2024-02-16 DIAGNOSIS — T8249XA Other complication of vascular dialysis catheter, initial encounter: Secondary | ICD-10-CM | POA: Diagnosis not present

## 2024-02-18 ENCOUNTER — Other Ambulatory Visit (HOSPITAL_COMMUNITY): Payer: Self-pay

## 2024-02-19 DIAGNOSIS — N186 End stage renal disease: Secondary | ICD-10-CM | POA: Diagnosis not present

## 2024-02-19 DIAGNOSIS — T8249XA Other complication of vascular dialysis catheter, initial encounter: Secondary | ICD-10-CM | POA: Diagnosis not present

## 2024-02-19 DIAGNOSIS — N2581 Secondary hyperparathyroidism of renal origin: Secondary | ICD-10-CM | POA: Diagnosis not present

## 2024-02-19 DIAGNOSIS — Z992 Dependence on renal dialysis: Secondary | ICD-10-CM | POA: Diagnosis not present

## 2024-02-21 DIAGNOSIS — Z992 Dependence on renal dialysis: Secondary | ICD-10-CM | POA: Diagnosis not present

## 2024-02-21 DIAGNOSIS — T8249XA Other complication of vascular dialysis catheter, initial encounter: Secondary | ICD-10-CM | POA: Diagnosis not present

## 2024-02-21 DIAGNOSIS — N186 End stage renal disease: Secondary | ICD-10-CM | POA: Diagnosis not present

## 2024-02-21 DIAGNOSIS — N2581 Secondary hyperparathyroidism of renal origin: Secondary | ICD-10-CM | POA: Diagnosis not present

## 2024-02-22 ENCOUNTER — Ambulatory Visit (HOSPITAL_COMMUNITY)

## 2024-02-22 ENCOUNTER — Ambulatory Visit (INDEPENDENT_AMBULATORY_CARE_PROVIDER_SITE_OTHER): Admitting: Pharmacist

## 2024-02-22 ENCOUNTER — Ambulatory Visit (HOSPITAL_COMMUNITY): Payer: Self-pay

## 2024-02-22 ENCOUNTER — Encounter (HOSPITAL_COMMUNITY): Payer: Self-pay | Admitting: *Deleted

## 2024-02-22 ENCOUNTER — Ambulatory Visit (HOSPITAL_COMMUNITY)
Admission: EM | Admit: 2024-02-22 | Discharge: 2024-02-22 | Disposition: A | Discharge status: Home / Self Care | Attending: Nurse Practitioner | Admitting: Nurse Practitioner

## 2024-02-22 ENCOUNTER — Ambulatory Visit (INDEPENDENT_AMBULATORY_CARE_PROVIDER_SITE_OTHER)

## 2024-02-22 ENCOUNTER — Ambulatory Visit (HOSPITAL_COMMUNITY)
Admission: RE | Admit: 2024-02-22 | Discharge: 2024-02-22 | Disposition: A | Discharge status: Home / Self Care | Source: Ambulatory Visit | Attending: Family Medicine | Admitting: Family Medicine

## 2024-02-22 ENCOUNTER — Encounter: Payer: Self-pay | Admitting: Pharmacist

## 2024-02-22 VITALS — BP 164/93 | HR 91 | Temp 98.3°F | Wt 130.0 lb

## 2024-02-22 DIAGNOSIS — Z794 Long term (current) use of insulin: Secondary | ICD-10-CM | POA: Diagnosis not present

## 2024-02-22 DIAGNOSIS — R0781 Pleurodynia: Secondary | ICD-10-CM

## 2024-02-22 DIAGNOSIS — I10 Essential (primary) hypertension: Secondary | ICD-10-CM

## 2024-02-22 DIAGNOSIS — R1900 Intra-abdominal and pelvic swelling, mass and lump, unspecified site: Secondary | ICD-10-CM | POA: Insufficient documentation

## 2024-02-22 DIAGNOSIS — R9431 Abnormal electrocardiogram [ECG] [EKG]: Secondary | ICD-10-CM | POA: Insufficient documentation

## 2024-02-22 DIAGNOSIS — E1165 Type 2 diabetes mellitus with hyperglycemia: Secondary | ICD-10-CM

## 2024-02-22 NOTE — Progress Notes (Unsigned)
    SUBJECTIVE:   CHIEF COMPLAINT / HPI:   Patient presents for visit with Dr. Koval for glucose management. Is also complaining of left sided rib and abdominal pain. Reports rib pain started about a week ago and is having associated epigastric pain. The pain is dull and achy. It doesn't hurt to walk or move around but the pain does come and go. He also feels like the left side of his belly under his ribs is a bit swollen. Denies any chest pain or trouble breathing. Denies left sided shoulder pain. Denies nausea or vomiting, no blood in stool.   PERTINENT  PMH / PSH:    OBJECTIVE:   BP (!) 164/93 (BP Location: Left Wrist, Patient Position: Sitting, Cuff Size: Normal)   Pulse 91   Temp 98.3 F (36.8 C)   Wt 130 lb (59 kg)   SpO2 100%   BMI 20.98 kg/m   General: A&O, NAD, Thin male  HEENT: No sign of trauma, EOM grossly intact Cardiac: RRR, harsh systolic murmur  Respiratory: CTAB, normal WOB, no w/c/r GI: Soft, mildly TTP over epigastric area, no rebound or guarding, no obvious swelling noted  Extremities: TTP along left lateral ribs, no swelling or masses noted  Neuro: Normal gait, moves all four extremities appropriately. Psych: Appropriate mood and affect   ASSESSMENT/PLAN:   Assessment & Plan Rib pain Obtained EKG in clinic which was concerning for some LVH and mild T wave abnormalities but nothing concerning for ACS. Given patient has recent history of pneumonia, will order chest xray to rule out complication. Ordered CBC/CMET and Lipase to rule out pancreatitis or other electrolyte abnormalities. Provided rib belt for support and lidocaine  patches. Suspect this is likely MSK in nature given TTP along with no other concerning symptoms. Will have patient follow up on Monday given complex PMHx.      Gloriann Ogren, MD [facility]

## 2024-02-22 NOTE — Assessment & Plan Note (Signed)
 Hypertension history - elevated today, possibly related to chest pain.   Blood pressure control is suboptimal due to lack of adherence with BID dosing of carvedilol . - Encouraged BID dosing of Carvedilol  25mg   - Continued amlodipine  10mg  and valsartan  320mg 

## 2024-02-22 NOTE — Assessment & Plan Note (Signed)
 Diabetes longstanding and currently with unknown control change since last visit due to lack of monitoring. DEXCOM G7 with Receiver set-up today during visit. Patient is able to verbalize appropriate hypoglycemia management plan. Medication adherence appears good.  -Continued basal insulin  Semglee  (insulin  glargine) 10 units once daily.  -Patient educated on purpose, proper use, and potential adverse effects.  -Extensively discussed pathophysiology of diabetes, recommended lifestyle interventions, dietary effects on blood sugar control.  -Counseled on s/sx of and management of hypoglycemia.

## 2024-02-22 NOTE — ED Provider Notes (Signed)
 [facility]    CSN: [id number] Arrival date & time: 02/22/24  1053      History   Chief Complaint Chief Complaint  Patient presents with   Flank Pain    HPI Adam Davis is a 48 y.o. male.   Discussed the use of AI scribe software for clinical note transcription with the patient, who gave verbal consent to proceed.   Adam Davis is a 48 y.o. male with a history of recent hospitalization for pneumonia (discharged on 01/27/24) and ongoing dialysis treatment, presents with left-sided and rib pain. The patient reports experiencing a dull, achy pain in his left side and ribs for approximately one week, which started after his discharge from the hospital. The pain is not exacerbated by coughing or deep breathing. Adam Davis also mentions occasional shortness of breath, which he attributes to low hemoglobin levels as explained by his provider at dialysis. He denies any chest pain outside of the left-sided discomfort. He follows a regular dialysis schedule of Tuesday, Thursday, and Saturday. For pain management, Adam Davis has been taking Tylenol  and muscle relaxers. He denies any current coughing, stating that his cough has resolved since his hospitalization for pneumonia.  The following portions of the patient's history were reviewed and updated as appropriate: allergies, current medications, past family history, past medical history, past social history, past surgical history, and problem list.    Past Medical History:  Diagnosis Date   Diabetes mellitus    ESRD on hemodialysis (HCC)    H/O acute post-streptococcal glomerulonephritis 1997   History of renal transplant    Hyperlipidemia    Hypertension    Onychomycosis of toenail    Tachycardia     Patient Active Problem List   Diagnosis Date Noted   Heart failure with mid-range ejection fraction (HCC) 01/30/2024   Pneumonia of both lungs due to infectious organism 01/28/2024   Acute hypoxic respiratory failure  (HCC) 01/27/2024   Anemia 07/31/2023   Erectile dysfunction 04/18/2022   Low back pain without sciatica 12/08/2021   Type 2 diabetes mellitus with hyperglycemia (HCC) 02/09/2021   ESRD (end stage renal disease) on dialysis (HCC) 02/09/2021   Essential hypertension, benign 10/30/2012   HLD (hyperlipidemia) 08/08/2012   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/2012   History of renal transplant 05/22/2012    Past Surgical History:  Procedure Laterality Date   A/V FISTULAGRAM Right 01/11/2024   Procedure: A/V Fistulagram;  Surgeon: Magda Debby SAILOR, MD;  Location: [facility];  Service: Cardiovascular;  Laterality: Right;   ANKLE SURGERY     AV FISTULA PLACEMENT  1997   Removed after transplant in 2006   AV FISTULA PLACEMENT Right 09/07/2023   Procedure: RIGHT BRACHIOCEPHALIC FISTULA;  Surgeon: Lanis Fonda BRAVO, MD;  Location: [facility] OR;  Service: Vascular;  Laterality: Right;  Block in the Right arm   INSERTION OF DIALYSIS CATHETER Right 08/06/2023   Procedure: INSERTION OF TUNNELED DIALYSIS CATHETER, RIGHT INTERNAL JUGULAR;  Surgeon: Lanis Fonda BRAVO, MD;  Location: [facility] OR;  Service: Vascular;  Laterality: Right;   IR FLUORO GUIDE CV LINE RIGHT  08/01/2023   IR US  GUIDE VASC ACCESS RIGHT  08/01/2023   KIDNEY SURGERY  2006   KIDNEY TRANSPLANT         Home Medications    Prior to Admission medications   Medication Sig Start Date End Date Taking? Authorizing Provider  amLODipine  (NORVASC ) 10 MG tablet Take 10 mg by mouth daily.   Yes [provider]  insulin  glargine-yfgn (SEMGLEE ) 100 UNIT/ML Pen Inject 10 Units into the skin daily. 01/23/24  Yes McDiarmid, Krystal BIRCH, MD  mycophenolate  (CELLCEPT ) 250 MG capsule Take 2 capsules (500 mg total) by mouth 2 (two) times daily. 01/29/24  Yes Gomes, Adriana, DO  rosuvastatin  (CRESTOR ) 10 MG tablet Take one tablet every Monday, Wednesday, and Friday. 01/16/23  Yes [name], [name], DO  sevelamer  carbonate (RENVELA ) 800  MG tablet Take 1 tablet (800 mg total) by mouth 3 (three) times daily with meals. 08/07/23  Yes Theophilus Pagan, MD  tacrolimus  (PROGRAF ) 1 MG capsule Take 1 mg by mouth 2 (two) times daily.   Yes [provider]  valsartan  (DIOVAN ) 320 MG tablet Take 1 tablet (320 mg total) by mouth every evening. 12/24/23  Yes Bryan Bianchi, MD  Blood Glucose Monitoring Suppl (BLOOD GLUCOSE MONITOR SYSTEM) w/Device KIT Use in the morning, at noon, and at bedtime. 08/07/23   Theophilus Pagan, MD  carvedilol  (COREG ) 25 MG tablet Take 1 tablet (25 mg total) by mouth 2 (two) times daily with a meal. 01/29/24   Janna Ferrier, DO  Continuous Glucose Sensor (DEXCOM G7 SENSOR) MISC Use as directed 01/23/24   McDiarmid, Krystal BIRCH, MD  Insulin  Pen Needle 32G X 4 MM MISC Use with Semglee  pen 08/07/23     multivitamin (RENA-VIT) TABS tablet Take 1 tablet by mouth daily. 08/06/23   Romelle Booty, MD  atorvastatin  (LIPITOR) 40 MG tablet Take 1 tablet (40 mg total) by mouth daily. Patient not taking: Reported on 07/09/2017 07/13/16 05/09/19  Doy Neville MATSU, MD    Family History Family History  Problem Relation Age of Onset   Diabetes Brother    Diabetes Maternal Grandmother    Hypertension Maternal Grandmother     Social History Social History   Tobacco Use   Smoking status: Never    Passive exposure: Never   Smokeless tobacco: Never  Vaping Use   Vaping status: Never Used  Substance Use Topics   Alcohol use: No   Drug use: No     Allergies   Atorvastatin    Review of Systems Review of Systems  Constitutional:  Negative for fever.  Respiratory:  Positive for shortness of breath. Negative for cough.   Cardiovascular:  Negative for chest pain.  Gastrointestinal:  Negative for nausea and vomiting.  All other systems reviewed and are negative.    Physical Exam Triage Vital Signs ED Triage Vitals  Encounter Vitals Group     BP 02/22/24 1109 (!) 171/105     Girls Systolic BP Percentile --       Girls Diastolic BP Percentile --      Boys Systolic BP Percentile --      Boys Diastolic BP Percentile --      Pulse Rate 02/22/24 1109 93     Resp 02/22/24 1109 18     Temp 02/22/24 1109 98.5 F (36.9 C)     Temp Source 02/22/24 1109 Oral     SpO2 02/22/24 1109 96 %     Weight --      Height --      Head Circumference --      Peak Flow --      Pain Score 02/22/24 1106 6     Pain Loc --      Pain Education --      Exclude from Growth Chart --    No data found.  Updated Vital Signs BP (!) 171/105 (BP Location: Left Arm)  Pulse 93   Temp 98.5 F (36.9 C) (Oral)   Resp 18   SpO2 96%   Visual Acuity Right Eye Distance:   Left Eye Distance:   Bilateral Distance:    Right Eye Near:   Left Eye Near:    Bilateral Near:     Physical Exam Vitals reviewed.  Constitutional:      General: He is awake. He is not in acute distress.    Appearance: Normal appearance. He is well-developed. He is not ill-appearing, toxic-appearing or diaphoretic.  HENT:     Head: Normocephalic.     Mouth/Throat:     Mouth: Mucous membranes are moist.   Eyes:     Conjunctiva/sclera: Conjunctivae normal.    Cardiovascular:     Rate and Rhythm: Normal rate and regular rhythm.     Heart sounds: Normal heart sounds.     Arteriovenous access: Right arteriovenous access is present.     Comments: AV fistula to right upper extremity, positive bruit and thrill Pulmonary:     Effort: Pulmonary effort is normal.     Breath sounds: Normal breath sounds and air entry.  Chest:     Chest wall: No deformity, swelling or tenderness.  Abdominal:   Musculoskeletal:        General: Normal range of motion.   Skin:    General: Skin is warm and dry.   Neurological:     General: No focal deficit present.     Mental Status: He is alert and oriented to person, place, and time.   Psychiatric:        Behavior: Behavior is cooperative.      UC Treatments / Results  Labs (all labs ordered are  listed, but only abnormal results are displayed) Labs Reviewed - No data to display  EKG   Radiology No results found.  Procedures Procedures (including critical care time)  Medications Ordered in UC Medications - No data to display  Initial Impression / Assessment and Plan / UC Course  I have reviewed the triage vital signs and the nursing notes.  Pertinent labs & imaging results that were available during my care of the patient were reviewed by me and considered in my medical decision making (see chart for details).     Patient presents with a one-week history of dull, achy left-sided rib pain and a palpable soft tissue bulge in the left upper quadrant, which developed a couple of weeks after hospitalization for pneumonia. The pain is constant but not worsened by coughing or deep breathing. On exam, a non-tender bulge is noted in the left upper quadrant. Differential includes soft tissue strain or musculoskeletal changes related to prior pneumonia and associated coughing. Chest and abdominal X-rays were ordered to evaluate for rib abnormalities or other visible concerns. Ultrasound of the abdomen is recommended for further assessment of the bulge if symptoms persist and should be ordered by the patient's primary care provider or nephrologist. Current pain control with Tylenol  and muscle relaxers may be continued as needed. Patient was advised to follow up with their PCP or nephrologist for further evaluation and ongoing management. Return to the ED if there is sudden worsening pain, swelling, difficulty breathing, or new symptoms such as vomiting or fever.  Today's evaluation has revealed no signs of a dangerous process. Discussed diagnosis with patient and/or guardian. Patient and/or guardian aware of their diagnosis, possible red flag symptoms to watch out for and need for close follow up. Patient and/or guardian understands verbal  and written discharge instructions. Patient and/or  guardian comfortable with plan and disposition.  Patient and/or guardian has a clear mental status at this time, good insight into illness (after discussion and teaching) and has clear judgment to make decisions regarding their care  Documentation was completed with the aid of voice recognition software. Transcription may contain typographical errors. Final Clinical Impressions(s) / UC Diagnoses   Final diagnoses:  Rib pain on left side  Abdominal wall bulge     Discharge Instructions      You were seen today for ongoing left-sided rib pain and a soft tissue bulge in your left upper abdomen that began about a week ago and developed a couple of weeks after your hospitalization for pneumonia. The pain is dull and achy but not worsened by coughing or deep breathing. A non-tender bulge was noted on your exam. This may be related to muscle strain or soft tissue changes from coughing during your recent illness.  Chest and abdominal X-rays were ordered to check for any changes in your ribs or other structures. Those results are still pending. An ultrasound of the abdomen is recommended if the bulge or discomfort continues, and this should be arranged by your primary care provider or nephrologist.  You may continue using Tylenol  and your prescribed muscle relaxers as needed for pain. Rest, avoid heavy lifting, and use warm compresses to help with muscle relaxation if discomfort continues.  Follow up with your primary care provider or nephrologist to review your X-ray results and discuss whether further imaging is needed. Go to the emergency department if you experience sudden or worsening pain, new or increased swelling, difficulty breathing, fever, or vomiting, as these may be signs of a more serious condition.      ED Prescriptions   None    PDMP not reviewed this encounter.   Iola Lukes, [place] 02/22/24 1212

## 2024-02-22 NOTE — Progress Notes (Unsigned)
 Reviewed and agree with Dr Rennis plan.

## 2024-02-22 NOTE — Discharge Instructions (Addendum)
 You were seen today for ongoing left-sided rib pain and a soft tissue bulge in your left upper abdomen that began about a week ago and developed a couple of weeks after your hospitalization for pneumonia. The pain is dull and achy but not worsened by coughing or deep breathing. A non-tender bulge was noted on your exam. This may be related to muscle strain or soft tissue changes from coughing during your recent illness.  Chest and abdominal X-rays were ordered to check for any changes in your ribs or other structures. Those results are still pending. An ultrasound of the abdomen is recommended if the bulge or discomfort continues, and this should be arranged by your primary care provider or nephrologist.  You may continue using Tylenol  and your prescribed muscle relaxers as needed for pain. Rest, avoid heavy lifting, and use warm compresses to help with muscle relaxation if discomfort continues.  Follow up with your primary care provider or nephrologist to review your X-ray results and discuss whether further imaging is needed. Go to the emergency department if you experience sudden or worsening pain, new or increased swelling, difficulty breathing, fever, or vomiting, as these may be signs of a more serious condition.

## 2024-02-22 NOTE — Patient Instructions (Addendum)
 It was wonderful to see you today.  Please bring ALL of your medications with you to every visit.   Today we talked about:  Your rib/abdominal pain. I am getting some lab work today and ordered a chest xray. Please go to [facility] at the following address. You can show up anytime between 8 am - 5 p M-F.   [address] [place], [place] [address]  Phone [phone] Fax [phone]  To enroll in [facility] Financial Assistance, please call [phone]  His office is open Tuesday, Wednesday, Thursday from 8:30a to 4:30p (Closed Monday & Friday)  Thank you for choosing [facility].   Please call [phone] with any questions about today's appointment.  Please arrive at least 15 minutes prior to your scheduled appointments.   If you had blood work today, I will send you a MyChart message or a letter if results are normal. Otherwise, I will give you a call.   If you had a referral placed, they will call you to set up an appointment. Please give us  a call if you don't hear back in the next 2 weeks.   If you need additional refills before your next appointment, please call your pharmacy first.   You should follow up in our clinic on Monday.  Gloriann Ogren, MD Family Medicine

## 2024-02-22 NOTE — ED Triage Notes (Signed)
 Pt states he had pneumonia a couple weeks ago and since then he has had rib pain on the left side as well as a bulge on the left side that is causing some pain. He has been taking tylenol  and muscle relaxers as needed but states no relief.

## 2024-02-22 NOTE — Progress Notes (Unsigned)
 S:     Chief Complaint  Patient presents with   Medication Management    Diabetes - CGM use   48 y.o. male who presents for diabetes evaluation, education, and management. Patient arrives in less than ideal spirits, walking slowly and states, I am going to Urgent care after this visit.   Patient was referred and last seen by Primary Care Provider, Dr. Lonnie, on 02/01/2024.  At last visit, patient treated for pneumonia.  Today, left-sided chest/rib pain were deferred to PCP, Dr. Lonnie who is also in clinic today.    PMH is significant for kidney transplant and currently on dialysis.   Focus and majority of my visit today was initiation of DEXCOM G7 Sensor with Receiver device.  Current diabetes medications include: Semglee  (insulin  glargine) 10 units daily Current hypertension medications include: Amlodipine  10 mg, valsartan  320 mg, carvedilol  25mg  once daily (denies taking twice daily) Current hyperlipidemia medications include: rosuvastatin  10 mg on M/W/F  Patient reports adherence with medications, however admits he has not been taking carvedilol  twice daily (only once).   Insurance coverage: Changing to [facility]  Patient denies hypoglycemic events.  Reported home fasting blood sugars: not testing.    O:   Review of Systems  Musculoskeletal:  Positive for joint pain (left side of chest).  All other systems reviewed and are negative.   Physical Exam Vitals reviewed.  Constitutional:      Appearance: Normal appearance.  Pulmonary:     Effort: Pulmonary effort is normal.   Neurological:     Mental Status: He is alert.   Psychiatric:        Thought Content: Thought content normal.    Lab Results  Component Value Date   HGBA1C 8.9 (H) 01/28/2024   Vitals:   02/22/24 0923  BP: (!) 164/93  Pulse: 91  Temp: 98.3 F (36.8 C)  SpO2: 100%    Lipid Panel     Component Value Date/Time   CHOL 226 (H) 11/07/2022 1700   TRIG 116 11/07/2022 1700   HDL 49  11/07/2022 1700   CHOLHDL 4.6 11/07/2022 1700   CHOLHDL 6.4 07/04/2012 0957   VLDL 55 (H) 07/04/2012 0957   LDLCALC 156 (H) 11/07/2022 1700    Clinical Atherosclerotic Cardiovascular Disease (ASCVD):  The 10-year ASCVD risk score (Arnett DK, et al., 2019) is: 24.5%   Values used to calculate the score:     Age: 6 years     Clincally relevant sex: Male     Is Non-Hispanic African American: Yes     Diabetic: Yes     Tobacco smoker: No     Systolic Blood Pressure: 169 mmHg     Is BP treated: Yes     HDL Cholesterol: 49 mg/dL     Total Cholesterol: 226 mg/dL    A/P: Diabetes longstanding and currently with unknown control change since last visit due to lack of monitoring. DEXCOM G7 with Receiver set-up today during visit. Patient is able to verbalize appropriate hypoglycemia management plan. Medication adherence appears good.  -Continued basal insulin  Semglee  (insulin  glargine) 10 units once daily.  -Patient educated on purpose, proper use, and potential adverse effects.  -Extensively discussed pathophysiology of diabetes, recommended lifestyle interventions, dietary effects on blood sugar control.  -Counseled on s/sx of and management of hypoglycemia.   Hypertension history - elevated today, possibly related to chest pain.   Blood pressure control is suboptimal due to lack of adherence with BID dosing of carvedilol . - Encouraged  BID dosing of Carvedilol  25mg   - Continued amlodipine  10mg  and valsartan  320mg    Written patient instructions provided. Patient verbalized understanding of treatment plan.  Total time in face to face counseling 21 minutes.    Follow-up:  Pharmacist 1 month - asked patient to schedule at his convenience PCP clinic visit in [place] - 02/25/2024   Note: Patient did go to Urgent Care following PCP visit today.

## 2024-02-23 DIAGNOSIS — N186 End stage renal disease: Secondary | ICD-10-CM | POA: Diagnosis not present

## 2024-02-23 DIAGNOSIS — T8249XA Other complication of vascular dialysis catheter, initial encounter: Secondary | ICD-10-CM | POA: Diagnosis not present

## 2024-02-23 DIAGNOSIS — N2581 Secondary hyperparathyroidism of renal origin: Secondary | ICD-10-CM | POA: Diagnosis not present

## 2024-02-23 DIAGNOSIS — Z992 Dependence on renal dialysis: Secondary | ICD-10-CM | POA: Diagnosis not present

## 2024-02-23 LAB — CBC WITH DIFFERENTIAL/PLATELET
Basophils Absolute: 0 10*3/uL (ref 0.0–0.2)
Basos: 0 %
EOS (ABSOLUTE): 0.2 10*3/uL (ref 0.0–0.4)
Eos: 5 %
Hematocrit: 29.5 % — ABNORMAL LOW (ref 37.5–51.0)
Hemoglobin: 8.5 g/dL — ABNORMAL LOW (ref 13.0–17.7)
Immature Grans (Abs): 0 10*3/uL (ref 0.0–0.1)
Immature Granulocytes: 0 %
Lymphocytes Absolute: 1.1 10*3/uL (ref 0.7–3.1)
Lymphs: 22 %
MCH: 21.6 pg — ABNORMAL LOW (ref 26.6–33.0)
MCHC: 28.8 g/dL — ABNORMAL LOW (ref 31.5–35.7)
MCV: 75 fL — ABNORMAL LOW (ref 79–97)
Monocytes Absolute: 0.5 10*3/uL (ref 0.1–0.9)
Monocytes: 11 %
Neutrophils Absolute: 3.1 10*3/uL (ref 1.4–7.0)
Neutrophils: 62 %
Platelets: 176 10*3/uL (ref 150–450)
RBC: 3.93 x10E6/uL — ABNORMAL LOW (ref 4.14–5.80)
RDW: 20.4 % — ABNORMAL HIGH (ref 11.6–15.4)
WBC: 5 10*3/uL (ref 3.4–10.8)

## 2024-02-23 LAB — COMPREHENSIVE METABOLIC PANEL WITH GFR
ALT: 16 IU/L (ref 0–44)
AST: 23 IU/L (ref 0–40)
Albumin: 3.9 g/dL — ABNORMAL LOW (ref 4.1–5.1)
Alkaline Phosphatase: 74 IU/L (ref 44–121)
BUN/Creatinine Ratio: 5 — ABNORMAL LOW (ref 9–20)
BUN: 24 mg/dL (ref 6–24)
Bilirubin Total: 0.3 mg/dL (ref 0.0–1.2)
CO2: 26 mmol/L (ref 20–29)
Calcium: 9.5 mg/dL (ref 8.7–10.2)
Chloride: 93 mmol/L — ABNORMAL LOW (ref 96–106)
Creatinine, Ser: 4.8 mg/dL — ABNORMAL HIGH (ref 0.76–1.27)
Globulin, Total: 2.2 g/dL (ref 1.5–4.5)
Glucose: 254 mg/dL — ABNORMAL HIGH (ref 70–99)
Potassium: 4.8 mmol/L (ref 3.5–5.2)
Sodium: 140 mmol/L (ref 134–144)
Total Protein: 6.1 g/dL (ref 6.0–8.5)
eGFR: 14 mL/min/{1.73_m2} — ABNORMAL LOW (ref >59)

## 2024-02-23 LAB — LIPASE: Lipase: 26 U/L (ref 13–78)

## 2024-02-25 ENCOUNTER — Ambulatory Visit: Payer: Self-pay

## 2024-02-25 ENCOUNTER — Ambulatory Visit: Payer: Self-pay | Admitting: Family Medicine

## 2024-02-25 MED ORDER — AMOXICILLIN-POT CLAVULANATE 875-125 MG PO TABS: 1.0000 | ORAL_TABLET | Freq: Two times a day (BID) | ORAL | 0 refills | Status: AC

## 2024-02-25 MED ORDER — AZITHROMYCIN 250 MG PO TABS: ORAL_TABLET | ORAL | 0 refills | Status: AC

## 2024-02-25 NOTE — Telephone Encounter (Signed)
 Called patient twice no answer, will try again later. Cassell Mary CMA

## 2024-02-25 NOTE — Telephone Encounter (Signed)
-----   Message from [place] sent at 02/25/2024  2:16 PM EDT ----- Regarding: Lab results Please call patient to let him know results are normal from the lab work.  ----- Message ----- From: Donah Laymon PARAS, MD Sent: 02/23/2024   9:32 PM EDT To: Winn Ogren, MD   ----- Message ----- From: Interface, Lab In [place] Sent: 02/22/2024  10:36 AM EDT To: Laymon PARAS Donah, MD

## 2024-02-25 NOTE — Progress Notes (Signed)
 Reviewed and agree with Dr Macky Lower plan.

## 2024-02-26 DIAGNOSIS — N2581 Secondary hyperparathyroidism of renal origin: Secondary | ICD-10-CM | POA: Diagnosis not present

## 2024-02-26 DIAGNOSIS — Z992 Dependence on renal dialysis: Secondary | ICD-10-CM | POA: Diagnosis not present

## 2024-02-26 DIAGNOSIS — T8249XA Other complication of vascular dialysis catheter, initial encounter: Secondary | ICD-10-CM | POA: Diagnosis not present

## 2024-02-26 DIAGNOSIS — N186 End stage renal disease: Secondary | ICD-10-CM | POA: Diagnosis not present

## 2024-02-26 DIAGNOSIS — R519 Headache, unspecified: Secondary | ICD-10-CM | POA: Diagnosis not present

## 2024-02-27 ENCOUNTER — Ambulatory Visit: Admitting: Student

## 2024-02-28 DIAGNOSIS — T8249XA Other complication of vascular dialysis catheter, initial encounter: Secondary | ICD-10-CM | POA: Diagnosis not present

## 2024-02-28 DIAGNOSIS — R519 Headache, unspecified: Secondary | ICD-10-CM | POA: Diagnosis not present

## 2024-02-28 DIAGNOSIS — N2581 Secondary hyperparathyroidism of renal origin: Secondary | ICD-10-CM | POA: Diagnosis not present

## 2024-02-28 DIAGNOSIS — N186 End stage renal disease: Secondary | ICD-10-CM | POA: Diagnosis not present

## 2024-02-28 DIAGNOSIS — Z992 Dependence on renal dialysis: Secondary | ICD-10-CM | POA: Diagnosis not present

## 2024-03-01 DIAGNOSIS — N2581 Secondary hyperparathyroidism of renal origin: Secondary | ICD-10-CM | POA: Diagnosis not present

## 2024-03-01 DIAGNOSIS — R519 Headache, unspecified: Secondary | ICD-10-CM | POA: Diagnosis not present

## 2024-03-01 DIAGNOSIS — N186 End stage renal disease: Secondary | ICD-10-CM | POA: Diagnosis not present

## 2024-03-01 DIAGNOSIS — T8249XA Other complication of vascular dialysis catheter, initial encounter: Secondary | ICD-10-CM | POA: Diagnosis not present

## 2024-03-01 DIAGNOSIS — Z992 Dependence on renal dialysis: Secondary | ICD-10-CM | POA: Diagnosis not present

## 2024-03-04 DIAGNOSIS — T8249XA Other complication of vascular dialysis catheter, initial encounter: Secondary | ICD-10-CM | POA: Diagnosis not present

## 2024-03-04 DIAGNOSIS — N2581 Secondary hyperparathyroidism of renal origin: Secondary | ICD-10-CM | POA: Diagnosis not present

## 2024-03-04 DIAGNOSIS — N186 End stage renal disease: Secondary | ICD-10-CM | POA: Diagnosis not present

## 2024-03-04 DIAGNOSIS — Z992 Dependence on renal dialysis: Secondary | ICD-10-CM | POA: Diagnosis not present

## 2024-03-05 ENCOUNTER — Encounter: Payer: Self-pay | Admitting: Family Medicine

## 2024-03-05 ENCOUNTER — Ambulatory Visit (INDEPENDENT_AMBULATORY_CARE_PROVIDER_SITE_OTHER): Admitting: Family Medicine

## 2024-03-05 VITALS — BP 125/75 | HR 92 | Temp 98.9°F | Ht 66.0 in | Wt 130.0 lb

## 2024-03-05 DIAGNOSIS — J189 Pneumonia, unspecified organism: Secondary | ICD-10-CM | POA: Diagnosis not present

## 2024-03-05 DIAGNOSIS — Z1211 Encounter for screening for malignant neoplasm of colon: Secondary | ICD-10-CM

## 2024-03-05 DIAGNOSIS — R0781 Pleurodynia: Secondary | ICD-10-CM | POA: Diagnosis not present

## 2024-03-05 NOTE — Patient Instructions (Addendum)
 It was wonderful to see you today.  Please bring ALL of your medications with you to every visit.   Today we talked about:  I'm glad your rib pain is better! Please continue using those lidocaine  patches and wearing your rib belt. We will get another xray in about 8 weeks to make sure there is no more concern for this pneumonia. Please make sure you get this xray right after a dialysis session so we can check for true pneumonia and not fluid overload.   Thank you for choosing [facility].   Please call [phone] with any questions about today's appointment.  Please arrive at least 15 minutes prior to your scheduled appointments.   If you had blood work today, I will send you a MyChart message or a letter if results are normal. Otherwise, I will give you a call.   If you had a referral placed, they will call you to set up an appointment. Please give us  a call if you don't hear back in the next 2 weeks.   If you need additional refills before your next appointment, please call your pharmacy first.   You should follow up in our clinic in [place] in about 8 weeks (around 04/30/2024) for pneumonia follow up .  Gloriann Ogren, MD Family Medicine

## 2024-03-05 NOTE — Progress Notes (Signed)
    SUBJECTIVE:   CHIEF COMPLAINT / HPI:   Follow up on rib pain. Was told he maybe he had pneumonia, is taking course of amoxicillin  for 14 days. No SOB or trouble breathing. Rib pain is better with lidocaine  patches and rib belt from previous visit. Patient is otherwise doing well. Has to see [facility] for follow up on sciatic pain.    PERTINENT  PMH / PSH:  ESRD on dialysis   OBJECTIVE:   BP 125/75   Pulse 92   Temp 98.9 F (37.2 C)   Ht 5' 6 (1.676 m)   Wt 130 lb (59 kg)   SpO2 96%   BMI 20.98 kg/m   General: A&O, NAD, thin male  HEENT: No sign of trauma, EOM grossly intact Cardiac: RRR, no m/r/g Respiratory: CTAB, normal WOB, no w/c/r GI: Soft, NTTP, non-distended  Extremities: NTTP, no peripheral edema. Neuro: Normal gait, moves all four extremities appropriately. Psych: Appropriate mood and affect   ASSESSMENT/PLAN:   Assessment & Plan Rib pain Improved. Continue treating conservatively. Likely MSK in nature 2/2 to coughing with history of multifocal pneumonia. Given xray findings concerning for ongoing pneumonia vs fluid from ESRD, will have patient follow up with xray in [redacted] weeks along with 8 week follow up.  Colon cancer screening Discussed risks/benefits today. Patient would like colon cancer screening. Referral sent.      Gloriann Ogren, MD [facility]

## 2024-03-06 DIAGNOSIS — N2581 Secondary hyperparathyroidism of renal origin: Secondary | ICD-10-CM | POA: Diagnosis not present

## 2024-03-06 DIAGNOSIS — N186 End stage renal disease: Secondary | ICD-10-CM | POA: Diagnosis not present

## 2024-03-06 DIAGNOSIS — T8249XA Other complication of vascular dialysis catheter, initial encounter: Secondary | ICD-10-CM | POA: Diagnosis not present

## 2024-03-06 DIAGNOSIS — Z992 Dependence on renal dialysis: Secondary | ICD-10-CM | POA: Diagnosis not present

## 2024-03-08 DIAGNOSIS — T8249XA Other complication of vascular dialysis catheter, initial encounter: Secondary | ICD-10-CM | POA: Diagnosis not present

## 2024-03-08 DIAGNOSIS — N186 End stage renal disease: Secondary | ICD-10-CM | POA: Diagnosis not present

## 2024-03-08 DIAGNOSIS — Z992 Dependence on renal dialysis: Secondary | ICD-10-CM | POA: Diagnosis not present

## 2024-03-08 DIAGNOSIS — N2581 Secondary hyperparathyroidism of renal origin: Secondary | ICD-10-CM | POA: Diagnosis not present

## 2024-03-11 DIAGNOSIS — N2581 Secondary hyperparathyroidism of renal origin: Secondary | ICD-10-CM | POA: Diagnosis not present

## 2024-03-11 DIAGNOSIS — N186 End stage renal disease: Secondary | ICD-10-CM | POA: Diagnosis not present

## 2024-03-11 DIAGNOSIS — R519 Headache, unspecified: Secondary | ICD-10-CM | POA: Diagnosis not present

## 2024-03-11 DIAGNOSIS — Z992 Dependence on renal dialysis: Secondary | ICD-10-CM | POA: Diagnosis not present

## 2024-03-11 DIAGNOSIS — T8249XA Other complication of vascular dialysis catheter, initial encounter: Secondary | ICD-10-CM | POA: Diagnosis not present

## 2024-03-13 ENCOUNTER — Ambulatory Visit

## 2024-03-13 ENCOUNTER — Ambulatory Visit (HOSPITAL_COMMUNITY)
Admission: RE | Admit: 2024-03-13 | Discharge: 2024-03-13 | Disposition: A | Discharge status: Home / Self Care | Source: Ambulatory Visit | Attending: Vascular Surgery | Admitting: Vascular Surgery

## 2024-03-13 ENCOUNTER — Other Ambulatory Visit: Payer: Self-pay

## 2024-03-13 VITALS — BP 172/98 | HR 86 | Temp 97.8°F | Wt 134.9 lb

## 2024-03-13 DIAGNOSIS — N186 End stage renal disease: Secondary | ICD-10-CM

## 2024-03-13 DIAGNOSIS — Z992 Dependence on renal dialysis: Secondary | ICD-10-CM | POA: Diagnosis not present

## 2024-03-13 DIAGNOSIS — R519 Headache, unspecified: Secondary | ICD-10-CM | POA: Diagnosis not present

## 2024-03-13 DIAGNOSIS — N2581 Secondary hyperparathyroidism of renal origin: Secondary | ICD-10-CM | POA: Diagnosis not present

## 2024-03-13 DIAGNOSIS — T8249XA Other complication of vascular dialysis catheter, initial encounter: Secondary | ICD-10-CM | POA: Diagnosis not present

## 2024-03-13 NOTE — Progress Notes (Signed)
 Office Note     CC:  follow up Requesting Provider:  Lonnie Earnest, MD  HPI: Adam Davis is a 48 y.o. ([birth year]) male who presents status post right upper extremity fistulogram with Dr. Magda on 01/11/2024.  Fistulogram demonstrated a slow to mature fistula with no opportunity for intervention and no central stenosis.  He was brought back to the office to discuss new access creation.  He has now had a radiocephalic fistula and a brachiocephalic fistula on the right arm.  He has a right IJ [facility] working well on HD.  He is dialyzing on a Tuesday Thursday Saturday schedule at the [facility] location.  He does not take any blood thinners.   Past Medical History:  Diagnosis Date   Diabetes mellitus    ESRD on hemodialysis (HCC)    H/O acute post-streptococcal glomerulonephritis 1997   History of renal transplant    Hyperlipidemia    Hypertension    Onychomycosis of toenail    Tachycardia     Past Surgical History:  Procedure Laterality Date   A/V FISTULAGRAM Right 01/11/2024   Procedure: A/V Fistulagram;  Surgeon: Magda Debby SAILOR, MD;  Location: [facility] INVASIVE CV LAB;  Service: Cardiovascular;  Laterality: Right;   ANKLE SURGERY     AV FISTULA PLACEMENT  1997   Removed after transplant in 2006   AV FISTULA PLACEMENT Right 09/07/2023   Procedure: RIGHT BRACHIOCEPHALIC FISTULA;  Surgeon: Lanis Fonda BRAVO, MD;  Location: [facility] OR;  Service: Vascular;  Laterality: Right;  Block in the Right arm   INSERTION OF DIALYSIS CATHETER Right 08/06/2023   Procedure: INSERTION OF TUNNELED DIALYSIS CATHETER, RIGHT INTERNAL JUGULAR;  Surgeon: Lanis Fonda BRAVO, MD;  Location: [facility] OR;  Service: Vascular;  Laterality: Right;   IR FLUORO GUIDE CV LINE RIGHT  08/01/2023   IR US  GUIDE VASC ACCESS RIGHT  08/01/2023   KIDNEY SURGERY  2006   KIDNEY TRANSPLANT      Social History   Socioeconomic History   Marital status: Married    Spouse name: Not on file   Number of children: Not on file   Years of  education: Not on file   Highest education level: Not on file  Occupational History   Not on file  Tobacco Use   Smoking status: Never    Passive exposure: Never   Smokeless tobacco: Never  Vaping Use   Vaping status: Never Used  Substance and Sexual Activity   Alcohol use: No   Drug use: No   Sexual activity: Not Currently  Other Topics Concern   Not on file  Social History Narrative   Not on file   Social Drivers of Health   Financial Resource Strain: Patient Declined (11/02/2023)   Received from [facility]   Overall Financial Resource Strain (CARDIA)    Difficulty of Paying Living Expenses: Patient declined  Food Insecurity: No Food Insecurity (01/27/2024)   Hunger Vital Sign    Worried About Running Out of Food in the Last Year: Never true    Ran Out of Food in the Last Year: Never true  Transportation Needs: No Transportation Needs (01/27/2024)   PRAPARE - Administrator, Civil Service (Medical): No    Lack of Transportation (Non-Medical): No  Physical Activity: Not on file  Stress: Not on file  Social Connections: Not on file  Intimate Partner Violence: Not At Risk (01/27/2024)   Humiliation, Afraid, Rape, and Kick questionnaire    Fear of  Current or Ex-Partner: No    Emotionally Abused: No    Physically Abused: No    Sexually Abused: No    Family History  Problem Relation Age of Onset   Diabetes Brother    Diabetes Maternal Grandmother    Hypertension Maternal Grandmother     Current Outpatient Medications  Medication Sig Dispense Refill   amLODipine  (NORVASC ) 10 MG tablet Take 10 mg by mouth daily.     Blood Glucose Monitoring Suppl (BLOOD GLUCOSE MONITOR SYSTEM) w/Device KIT Use in the morning, at noon, and at bedtime. 1 kit 0   carvedilol  (COREG ) 25 MG tablet Take 1 tablet (25 mg total) by mouth 2 (two) times daily with a meal. 60 tablet 0   Continuous Glucose Sensor (DEXCOM G7 SENSOR) MISC Use as directed 3 each 11   insulin   glargine-yfgn (SEMGLEE ) 100 UNIT/ML Pen Inject 10 Units into the skin daily. 15 mL 1   Insulin  Pen Needle 32G X 4 MM MISC Use with Semglee  pen 100 each 0   multivitamin (RENA-VIT) TABS tablet Take 1 tablet by mouth daily. 30 tablet 0   mycophenolate  (CELLCEPT ) 250 MG capsule Take 2 capsules (500 mg total) by mouth 2 (two) times daily. 120 capsule 0   rosuvastatin  (CRESTOR ) 10 MG tablet Take one tablet every Monday, Wednesday, and Friday. 30 tablet 1   sevelamer  carbonate (RENVELA ) 800 MG tablet Take 1 tablet (800 mg total) by mouth 3 (three) times daily with meals. 90 tablet 0   tacrolimus  (PROGRAF ) 1 MG capsule Take 1 mg by mouth 2 (two) times daily.     valsartan  (DIOVAN ) 320 MG tablet Take 1 tablet (320 mg total) by mouth every evening. 30 tablet 0   No current facility-administered medications for this visit.    Allergies  Allergen Reactions   Atorvastatin  Other (See Comments)    Myalgia - resolved with discontinuation     REVIEW OF SYSTEMS:   [X]  denotes positive finding, [ ]  denotes negative finding Cardiac  Comments:  Chest pain or chest pressure:    Shortness of breath upon exertion:    Short of breath when lying flat:    Irregular heart rhythm:        Vascular    Pain in calf, thigh, or hip brought on by ambulation:    Pain in feet at night that wakes you up from your sleep:     Blood clot in your veins:    Leg swelling:         Pulmonary    Oxygen at home:    Productive cough:     Wheezing:         Neurologic    Sudden weakness in arms or legs:     Sudden numbness in arms or legs:     Sudden onset of difficulty speaking or slurred speech:    Temporary loss of vision in one eye:     Problems with dizziness:         Gastrointestinal    Blood in stool:     Vomited blood:         Genitourinary    Burning when urinating:     Blood in urine:        Psychiatric    Major depression:         Hematologic    Bleeding problems:    Problems with blood clotting  too easily:        Skin    Rashes or ulcers:  Constitutional    Fever or chills:      PHYSICAL EXAMINATION:  Vitals:   03/13/24 0857  BP: (!) 172/98  Pulse: 86  Temp: 97.8 F (36.6 C)  TempSrc: Oral  Weight: 134 lb 14.4 oz (61.2 kg)    General:  WDWN in NAD; vital signs documented above Gait: Not observed HENT: WNL, normocephalic Pulmonary: normal non-labored breathing Cardiac: regular HR Abdomen: soft, NT, no masses Skin: without rashes Vascular Exam/Pulses: palpable radial pulses Extremities: without ischemic changes, without Gangrene , without cellulitis; without open wounds;  Musculoskeletal: no muscle wasting or atrophy  Neurologic: A&O X 3 Psychiatric:  The pt has Normal affect.   Non-Invasive Vascular Imaging:   Right arm fistula duplex with low flow volume and low flow velocities    ASSESSMENT/PLAN:: 48 y.o. male status post right arm fistulogram who returns to clinic to discuss new dialysis access  Fistulogram demonstrated a slow to mature fistula with no opportunity for intervention.  Fistulogram was also negative for central venous stenosis.  He has now had a radiocephalic and a brachiocephalic fistula on the right arm.  We will plan a right upper arm AV graft on the nondialysis day in the near future.  He will continue on HD via right IJ [facility] for now.  AV graft placement was discussed with the patient and all questions were answered.  He is agreeable to proceed.   Donnice Sender, PA-C [facility] [phone]  Clinic MD:   [place]

## 2024-03-15 DIAGNOSIS — R519 Headache, unspecified: Secondary | ICD-10-CM | POA: Diagnosis not present

## 2024-03-15 DIAGNOSIS — Z992 Dependence on renal dialysis: Secondary | ICD-10-CM | POA: Diagnosis not present

## 2024-03-15 DIAGNOSIS — T8249XA Other complication of vascular dialysis catheter, initial encounter: Secondary | ICD-10-CM | POA: Diagnosis not present

## 2024-03-15 DIAGNOSIS — N2581 Secondary hyperparathyroidism of renal origin: Secondary | ICD-10-CM | POA: Diagnosis not present

## 2024-03-15 DIAGNOSIS — N186 End stage renal disease: Secondary | ICD-10-CM | POA: Diagnosis not present

## 2024-03-18 DIAGNOSIS — N2581 Secondary hyperparathyroidism of renal origin: Secondary | ICD-10-CM | POA: Diagnosis not present

## 2024-03-18 DIAGNOSIS — Z992 Dependence on renal dialysis: Secondary | ICD-10-CM | POA: Diagnosis not present

## 2024-03-18 DIAGNOSIS — L299 Pruritus, unspecified: Secondary | ICD-10-CM | POA: Diagnosis not present

## 2024-03-18 DIAGNOSIS — R52 Pain, unspecified: Secondary | ICD-10-CM | POA: Diagnosis not present

## 2024-03-18 DIAGNOSIS — T8249XA Other complication of vascular dialysis catheter, initial encounter: Secondary | ICD-10-CM | POA: Diagnosis not present

## 2024-03-18 DIAGNOSIS — N186 End stage renal disease: Secondary | ICD-10-CM | POA: Diagnosis not present

## 2024-03-20 DIAGNOSIS — T8249XA Other complication of vascular dialysis catheter, initial encounter: Secondary | ICD-10-CM | POA: Diagnosis not present

## 2024-03-20 DIAGNOSIS — L299 Pruritus, unspecified: Secondary | ICD-10-CM | POA: Diagnosis not present

## 2024-03-20 DIAGNOSIS — N2581 Secondary hyperparathyroidism of renal origin: Secondary | ICD-10-CM | POA: Diagnosis not present

## 2024-03-20 DIAGNOSIS — R52 Pain, unspecified: Secondary | ICD-10-CM | POA: Diagnosis not present

## 2024-03-20 DIAGNOSIS — N186 End stage renal disease: Secondary | ICD-10-CM | POA: Diagnosis not present

## 2024-03-20 DIAGNOSIS — Z992 Dependence on renal dialysis: Secondary | ICD-10-CM | POA: Diagnosis not present

## 2024-03-22 DIAGNOSIS — N186 End stage renal disease: Secondary | ICD-10-CM | POA: Diagnosis not present

## 2024-03-22 DIAGNOSIS — L299 Pruritus, unspecified: Secondary | ICD-10-CM | POA: Diagnosis not present

## 2024-03-22 DIAGNOSIS — R52 Pain, unspecified: Secondary | ICD-10-CM | POA: Diagnosis not present

## 2024-03-22 DIAGNOSIS — T8249XA Other complication of vascular dialysis catheter, initial encounter: Secondary | ICD-10-CM | POA: Diagnosis not present

## 2024-03-22 DIAGNOSIS — Z992 Dependence on renal dialysis: Secondary | ICD-10-CM | POA: Diagnosis not present

## 2024-03-22 DIAGNOSIS — N2581 Secondary hyperparathyroidism of renal origin: Secondary | ICD-10-CM | POA: Diagnosis not present

## 2024-03-25 DIAGNOSIS — N186 End stage renal disease: Secondary | ICD-10-CM | POA: Diagnosis not present

## 2024-03-25 DIAGNOSIS — N2581 Secondary hyperparathyroidism of renal origin: Secondary | ICD-10-CM | POA: Diagnosis not present

## 2024-03-25 DIAGNOSIS — T8249XA Other complication of vascular dialysis catheter, initial encounter: Secondary | ICD-10-CM | POA: Diagnosis not present

## 2024-03-25 DIAGNOSIS — Z992 Dependence on renal dialysis: Secondary | ICD-10-CM | POA: Diagnosis not present

## 2024-03-27 DIAGNOSIS — T8612 Kidney transplant failure: Secondary | ICD-10-CM | POA: Diagnosis not present

## 2024-03-27 DIAGNOSIS — T8249XA Other complication of vascular dialysis catheter, initial encounter: Secondary | ICD-10-CM | POA: Diagnosis not present

## 2024-03-27 DIAGNOSIS — N186 End stage renal disease: Secondary | ICD-10-CM | POA: Diagnosis not present

## 2024-03-27 DIAGNOSIS — N2581 Secondary hyperparathyroidism of renal origin: Secondary | ICD-10-CM | POA: Diagnosis not present

## 2024-03-27 DIAGNOSIS — Z992 Dependence on renal dialysis: Secondary | ICD-10-CM | POA: Diagnosis not present

## 2024-03-29 DIAGNOSIS — Z992 Dependence on renal dialysis: Secondary | ICD-10-CM | POA: Diagnosis not present

## 2024-03-29 DIAGNOSIS — N186 End stage renal disease: Secondary | ICD-10-CM | POA: Diagnosis not present

## 2024-03-29 DIAGNOSIS — N2581 Secondary hyperparathyroidism of renal origin: Secondary | ICD-10-CM | POA: Diagnosis not present

## 2024-03-29 DIAGNOSIS — T8249XA Other complication of vascular dialysis catheter, initial encounter: Secondary | ICD-10-CM | POA: Diagnosis not present

## 2024-04-01 DIAGNOSIS — Z992 Dependence on renal dialysis: Secondary | ICD-10-CM | POA: Diagnosis not present

## 2024-04-01 DIAGNOSIS — T8249XA Other complication of vascular dialysis catheter, initial encounter: Secondary | ICD-10-CM | POA: Diagnosis not present

## 2024-04-01 DIAGNOSIS — N186 End stage renal disease: Secondary | ICD-10-CM | POA: Diagnosis not present

## 2024-04-01 DIAGNOSIS — N2581 Secondary hyperparathyroidism of renal origin: Secondary | ICD-10-CM | POA: Diagnosis not present

## 2024-04-01 DIAGNOSIS — R52 Pain, unspecified: Secondary | ICD-10-CM | POA: Diagnosis not present

## 2024-04-03 DIAGNOSIS — N2581 Secondary hyperparathyroidism of renal origin: Secondary | ICD-10-CM | POA: Diagnosis not present

## 2024-04-03 DIAGNOSIS — Z992 Dependence on renal dialysis: Secondary | ICD-10-CM | POA: Diagnosis not present

## 2024-04-03 DIAGNOSIS — N186 End stage renal disease: Secondary | ICD-10-CM | POA: Diagnosis not present

## 2024-04-03 DIAGNOSIS — T8249XA Other complication of vascular dialysis catheter, initial encounter: Secondary | ICD-10-CM | POA: Diagnosis not present

## 2024-04-03 DIAGNOSIS — R52 Pain, unspecified: Secondary | ICD-10-CM | POA: Diagnosis not present

## 2024-04-05 DIAGNOSIS — Z992 Dependence on renal dialysis: Secondary | ICD-10-CM | POA: Diagnosis not present

## 2024-04-05 DIAGNOSIS — R52 Pain, unspecified: Secondary | ICD-10-CM | POA: Diagnosis not present

## 2024-04-05 DIAGNOSIS — N186 End stage renal disease: Secondary | ICD-10-CM | POA: Diagnosis not present

## 2024-04-05 DIAGNOSIS — T8249XA Other complication of vascular dialysis catheter, initial encounter: Secondary | ICD-10-CM | POA: Diagnosis not present

## 2024-04-05 DIAGNOSIS — N2581 Secondary hyperparathyroidism of renal origin: Secondary | ICD-10-CM | POA: Diagnosis not present

## 2024-04-08 DIAGNOSIS — R519 Headache, unspecified: Secondary | ICD-10-CM | POA: Diagnosis not present

## 2024-04-08 DIAGNOSIS — N2581 Secondary hyperparathyroidism of renal origin: Secondary | ICD-10-CM | POA: Diagnosis not present

## 2024-04-08 DIAGNOSIS — N186 End stage renal disease: Secondary | ICD-10-CM | POA: Diagnosis not present

## 2024-04-08 DIAGNOSIS — Z992 Dependence on renal dialysis: Secondary | ICD-10-CM | POA: Diagnosis not present

## 2024-04-08 DIAGNOSIS — T8249XA Other complication of vascular dialysis catheter, initial encounter: Secondary | ICD-10-CM | POA: Diagnosis not present

## 2024-04-10 DIAGNOSIS — R519 Headache, unspecified: Secondary | ICD-10-CM | POA: Diagnosis not present

## 2024-04-10 DIAGNOSIS — T8249XA Other complication of vascular dialysis catheter, initial encounter: Secondary | ICD-10-CM | POA: Diagnosis not present

## 2024-04-10 DIAGNOSIS — N2581 Secondary hyperparathyroidism of renal origin: Secondary | ICD-10-CM | POA: Diagnosis not present

## 2024-04-10 DIAGNOSIS — Z992 Dependence on renal dialysis: Secondary | ICD-10-CM | POA: Diagnosis not present

## 2024-04-10 DIAGNOSIS — N186 End stage renal disease: Secondary | ICD-10-CM | POA: Diagnosis not present

## 2024-04-12 DIAGNOSIS — R519 Headache, unspecified: Secondary | ICD-10-CM | POA: Diagnosis not present

## 2024-04-12 DIAGNOSIS — N2581 Secondary hyperparathyroidism of renal origin: Secondary | ICD-10-CM | POA: Diagnosis not present

## 2024-04-12 DIAGNOSIS — N186 End stage renal disease: Secondary | ICD-10-CM | POA: Diagnosis not present

## 2024-04-12 DIAGNOSIS — T8249XA Other complication of vascular dialysis catheter, initial encounter: Secondary | ICD-10-CM | POA: Diagnosis not present

## 2024-04-12 DIAGNOSIS — Z992 Dependence on renal dialysis: Secondary | ICD-10-CM | POA: Diagnosis not present

## 2024-04-15 ENCOUNTER — Encounter (HOSPITAL_COMMUNITY): Payer: Self-pay | Admitting: Vascular Surgery

## 2024-04-15 ENCOUNTER — Other Ambulatory Visit: Payer: Self-pay

## 2024-04-15 DIAGNOSIS — Z992 Dependence on renal dialysis: Secondary | ICD-10-CM | POA: Diagnosis not present

## 2024-04-15 DIAGNOSIS — N2581 Secondary hyperparathyroidism of renal origin: Secondary | ICD-10-CM | POA: Diagnosis not present

## 2024-04-15 DIAGNOSIS — T8249XA Other complication of vascular dialysis catheter, initial encounter: Secondary | ICD-10-CM | POA: Diagnosis not present

## 2024-04-15 DIAGNOSIS — N186 End stage renal disease: Secondary | ICD-10-CM | POA: Diagnosis not present

## 2024-04-15 NOTE — Progress Notes (Signed)
 SDW call  Patient was given pre-op  instructions over the phone. Patient verbalized understanding of instructions provided. Patient with pneumonia in June.  Currently denies any fever or cough. States he has SOB that is relieved by dialysis.      PCP - Dr. Winn Ogren Cardiologist - denies, was suppose to f/u with Cardiology, states they didn't call and he didn't call them Pulmonary:  Nephrology: [facility] Kidney transplant: [facility]plant Team   PPM/ICD - denies Device Orders - na Rep Notified - na   Chest x-ray - 01/27/2024 EKG -  02/22/2024 Stress Test -01/29/2024 ECHO -  Cardiac Cath -   Sleep Study/sleep apnea/CPAP: denies  Type II diabetic.  A1C 8.9 on 01/28/2024. Uses CGM Dexcom 7 to left arm Fasting Blood sugar range: 160-200 How often check sugars: continuous Semglee , instructed to use 5 units DOS which is 50% of regular dose   Blood Thinner Instructions: denies Aspirin Instructions:denies   ERAS Protcol - NPO   Anesthesia review: Yes. Kidney transplant, HLD, DM, HTN, ESRD with dialysis T, Th, Sat   Your procedure is scheduled on Wednesday Augus 20, 2025  Report to [facility] Main Entrance A at   0950 A.M., then check in with the Admitting office.  Call this number if you have problems the morning of surgery:  [phone]   If you have any questions prior to your surgery date call [phone]: Open Monday-Friday 8am-4pm If you experience any cold or flu symptoms such as cough, fever, chills, shortness of breath, etc. between now and your scheduled surgery, please notify us  at the above number    Remember:  Do not eat or drink after midnight the night before your surgery  Take these medicines the morning of surgery with A SIP OF WATER:  Carvedilol , cellcept , rosuvastain, prograf   As of today, STOP taking any Aspirin (unless otherwise instructed by your surgeon) Aleve , Naproxen , Ibuprofen, Motrin, Advil, Goody's, BC's, all herbal medications, fish oil, and all  vitamins.

## 2024-04-15 NOTE — Anesthesia Preprocedure Evaluation (Signed)
 Anesthesia Evaluation  Patient identified by MRN, date of birth, ID band Patient awake    Reviewed: Allergy & Precautions, H&P , NPO status , Patient's Chart, lab work & pertinent test results  Airway Mallampati: III  TM Distance: >3 FB Neck ROM: Full    Dental  (+) Teeth Intact, Dental Advisory Given   Pulmonary pneumonia, unresolved Snores at night, no sleep study  admitted from 6/1-6/4 for multifocal pneumonia. He did not have respiratory failure. Was treated with abx and clinically improved. Echo was obtained during admission and showed EF of 40%, grade II DD, RVSP 59.9 mmHg.    Pulmonary exam normal breath sounds clear to auscultation       Cardiovascular hypertension (155/105 preop, per pt BP variable), Pt. on medications pulmonary hypertension (mod pHTN on echo)+CHF (LVEF 40%, grade 2 diastolic dysfunction)  Normal cardiovascular exam+ Valvular Problems/Murmurs (mod MR) MR  Rhythm:Regular Rate:Normal  Echo 01/2024  1. No left ventricular thrombus is seen (Definity  contrast was used).  Left ventricular ejection fraction, by estimation, is 40%. The left  ventricle has mild to moderately decreased function. The left ventricle  demonstrates global hypokinesis. There is  moderate concentric left ventricular hypertrophy. Left ventricular  diastolic parameters are consistent with Grade II diastolic dysfunction  (pseudonormalization). Elevated left atrial pressure.   2. Right ventricular systolic function is normal. The right ventricular  size is normal. There is moderately elevated pulmonary artery systolic  pressure. The estimated right ventricular systolic pressure is 59.9 mmHg.   3. Left atrial size was moderately dilated.   4. Right atrial size was moderately dilated.   5. A small pericardial effusion is present. The pericardial effusion is  circumferential. There is no evidence of cardiac tamponade.   6. The mitral valve is  normal in structure. Moderate mitral valve  regurgitation. No evidence of mitral stenosis.   7. The aortic valve is tricuspid. There is mild calcification of the  aortic valve. Aortic valve regurgitation is trivial. Aortic valve  sclerosis/calcification is present, without any evidence of aortic  stenosis.   8. There is borderline dilatation of the aortic root and of the ascending  aorta, measuring 38 mm.   9. The inferior vena cava is dilated in size with <50% respiratory  variability, suggesting right atrial pressure of 15 mmHg.      Neuro/Psych negative neurological ROS  negative psych ROS   GI/Hepatic negative GI ROS, Neg liver ROS,,,  Endo/Other  negative endocrine ROSdiabetes, Well Controlled, Type 2, Insulin  Dependent  Took 5 units of longacting FS 153 preop A1c 8.9  Renal/GU ESRF and DialysisRenal diseaseLast HD yesterday  K 4.7 this AM   negative genitourinary   Musculoskeletal negative musculoskeletal ROS (+)    Abdominal   Peds negative pediatric ROS (+)  Hematology  (+) Blood dyscrasia, anemia Hb 10.5   Anesthesia Other Findings   Reproductive/Obstetrics negative OB ROS                              Anesthesia Physical Anesthesia Plan  ASA: 3  Anesthesia Plan: MAC and Regional   Post-op Pain Management: Tylenol  PO (pre-op )*   Induction:   PONV Risk Score and Plan: 2 and Propofol  infusion and TIVA  Airway Management Planned: Natural Airway and Simple Face Mask  Additional Equipment: None  Intra-op Plan:   Post-operative Plan:   Informed Consent: I have reviewed the patients History and Physical, chart, labs and discussed the  procedure including the risks, benefits and alternatives for the proposed anesthesia with the patient or authorized representative who has indicated his/her understanding and acceptance.       Plan Discussed with: CRNA  Anesthesia Plan Comments:          Anesthesia Quick  Evaluation

## 2024-04-15 NOTE — Progress Notes (Signed)
 Case: [id number] Date/Time: 04/16/24 1208   Procedure: INSERTION, GRAFT, ARTERIOVENOUS, UPPER EXTREMITY (Right)   Anesthesia type: Monitor Anesthesia Care   Diagnosis: ESRD (end stage renal disease) (HCC) [N18.6]   Pre-op  diagnosis: esrd   [facility] RO[facility] 11 / [facility]   Surgeons: Magda Debby SAILOR, MD       DISCUSSION: Adam Davis is a 48 yo male with PMH of HTN, CHF EF 40%, pulmonary HTN, IDDM (A1c 8.9 on 6/2), ESRD on dialysis T/Th/Sat, hx of renal transplant (2006), anemia, multifocal PNA in June 2025.   Patient follows with Nephrology for hx of ESRD. He dialyzes T/Thurs/Sat via R internal jugular TDC. He had a R brachiocephalic fistula created in Jan 2025 but it has been slow to mature. Seen by Vascular on 03/13/24. Now scheduled for surgery above.  Patient admitted from 6/1-6/4 for multifocal pneumonia. He did not have respiratory failure. Was treated with abx and clinically improved. Echo was obtained during admission and showed EF of 40%, grade II DD, RVSP 59.9 mmHg. No prior echo to compare. Per hospitalist: Consider cardiology referral outpatient  Seen by PCP on 7/9 for ongoing rib pain. Suspected to be MSK due to coughing  Hx of IDDM which is uncontrolled. Last A1c was 8.9 in June  Hx of anemia. Baseline Hgb 7-9  Discussed with Dr. Treen. Patient has probable OSA based on Echo. Ok to proceed.  VS:  Wt Readings from Last 3 Encounters:  03/13/24 61.2 kg  03/05/24 59 kg  02/22/24 59 kg   Temp Readings from Last 3 Encounters:  03/13/24 36.6 C (Oral)  03/05/24 37.2 C  02/22/24 36.9 C (Oral)   BP Readings from Last 3 Encounters:  03/13/24 (!) 172/98  03/05/24 125/75  02/22/24 (!) 169/98   Pulse Readings from Last 3 Encounters:  03/13/24 86  03/05/24 92  02/22/24 93     PROVIDERS: Lonnie Mahnoor, MD   LABS: Obtain DOS   IMAGES: CXR 02/22/24:   IMPRESSION: 1. No acute osseous abnormality.  No obvious displaced rib fracture. 2. Patchy bilateral  airspace opacities, mildly decreased compared to the prior exam, may reflect multifocal pneumonia. This could also reflect pulmonary edema in the appropriate clinical setting. 3. Stable cardiomegaly.      EKG 02/22/24:  Normal sinus rhythm, rate 90 Moderate voltage criteria for LVH, may be normal variant ( Sokolow-Lyon , Cornell product ) Abnormal QRS-T angle, consider primary T wave abnormality Prolonged QT Abnormal ECG  CV:  Echo 01/29/24:  IMPRESSIONS    1. No left ventricular thrombus is seen (Definity  contrast was used). Left ventricular ejection fraction, by estimation, is 40%. The left ventricle has mild to moderately decreased function. The left ventricle demonstrates global hypokinesis. There is moderate concentric left ventricular hypertrophy. Left ventricular diastolic parameters are consistent with Grade II diastolic dysfunction (pseudonormalization). Elevated left atrial pressure.  2. Right ventricular systolic function is normal. The right ventricular size is normal. There is moderately elevated pulmonary artery systolic pressure. The estimated right ventricular systolic pressure is 59.9 mmHg.  3. Left atrial size was moderately dilated.  4. Right atrial size was moderately dilated.  5. A small pericardial effusion is present. The pericardial effusion is circumferential. There is no evidence of cardiac tamponade.  6. The mitral valve is normal in structure. Moderate mitral valve regurgitation. No evidence of mitral stenosis.  7. The aortic valve is tricuspid. There is mild calcification of the aortic valve. Aortic valve regurgitation is trivial. Aortic valve sclerosis/calcification is present,  without any evidence of aortic stenosis.  8. There is borderline dilatation of the aortic root and of the ascending aorta, measuring 38 mm.  9. The inferior vena cava is dilated in size with <50% respiratory variability, suggesting right atrial pressure of 15 mmHg. Past  Medical History:  Diagnosis Date   Diabetes mellitus    ESRD on hemodialysis (HCC)    H/O acute post-streptococcal glomerulonephritis 1997   History of renal transplant    Hyperlipidemia    Hypertension    Onychomycosis of toenail    Tachycardia     Past Surgical History:  Procedure Laterality Date   A/V FISTULAGRAM Right 01/11/2024   Procedure: A/V Fistulagram;  Surgeon: Magda Debby SAILOR, MD;  [facility];  Service: Cardiovascular;  Laterality: Right;   ANKLE SURGERY     AV FISTULA PLACEMENT  1997   Removed after transplant in 2006   AV FISTULA PLACEMENT Right 09/07/2023   Procedure: RIGHT BRACHIOCEPHALIC FISTULA;  Surgeon: Lanis Fonda BRAVO, MD;  Location: [facility] OR;  Service: Vascular;  Laterality: Right;  Block in the Right arm   INSERTION OF DIALYSIS CATHETER Right 08/06/2023   Procedure: INSERTION OF TUNNELED DIALYSIS CATHETER, RIGHT INTERNAL JUGULAR;  Surgeon: Lanis Fonda BRAVO, MD;  Location: [facility] OR;  Service: Vascular;  Laterality: Right;   IR FLUORO GUIDE CV LINE RIGHT  08/01/2023   IR US  GUIDE VASC ACCESS RIGHT  08/01/2023   KIDNEY SURGERY  2006   KIDNEY TRANSPLANT      MEDICATIONS: No current facility-administered medications for this encounter.    amLODipine  (NORVASC ) 10 MG tablet   Blood Glucose Monitoring Suppl (BLOOD GLUCOSE MONITOR SYSTEM) w/Device KIT   carvedilol  (COREG ) 25 MG tablet   Continuous Glucose Sensor (DEXCOM G7 SENSOR) MISC   insulin  glargine-yfgn (SEMGLEE ) 100 UNIT/ML Pen   Insulin  Pen Needle 32G X 4 MM MISC   multivitamin (RENA-VIT) TABS tablet   mycophenolate  (CELLCEPT ) 250 MG capsule   rosuvastatin  (CRESTOR ) 10 MG tablet   sevelamer  carbonate (RENVELA ) 800 MG tablet   tacrolimus  (PROGRAF ) 1 MG capsule   valsartan  (DIOVAN ) 320 MG tablet   Burnard CHRISTELLA Senna, PA-C [facility] Surgical Short Stay/Anesthesiology [facility] Phone [phone] 04/15/2024 9:10 AM

## 2024-04-16 ENCOUNTER — Other Ambulatory Visit: Payer: Self-pay

## 2024-04-16 ENCOUNTER — Ambulatory Visit (HOSPITAL_COMMUNITY)
Admission: RE | Admit: 2024-04-16 | Discharge: 2024-04-16 | Disposition: A | Discharge status: Home / Self Care | Attending: Vascular Surgery | Admitting: Vascular Surgery

## 2024-04-16 ENCOUNTER — Encounter (HOSPITAL_COMMUNITY)
Admission: RE | Disposition: A | Discharge status: Home / Self Care | Payer: Self-pay | Source: Home / Self Care | Attending: Vascular Surgery

## 2024-04-16 ENCOUNTER — Ambulatory Visit (HOSPITAL_COMMUNITY): Payer: Self-pay | Admitting: Medical

## 2024-04-16 ENCOUNTER — Ambulatory Visit (HOSPITAL_BASED_OUTPATIENT_CLINIC_OR_DEPARTMENT_OTHER): Payer: Self-pay | Admitting: Medical

## 2024-04-16 ENCOUNTER — Encounter (HOSPITAL_COMMUNITY): Payer: Self-pay | Admitting: Vascular Surgery

## 2024-04-16 DIAGNOSIS — I132 Hypertensive heart and chronic kidney disease with heart failure and with stage 5 chronic kidney disease, or end stage renal disease: Secondary | ICD-10-CM | POA: Diagnosis not present

## 2024-04-16 DIAGNOSIS — I3139 Other pericardial effusion (noninflammatory): Secondary | ICD-10-CM | POA: Diagnosis not present

## 2024-04-16 DIAGNOSIS — E1122 Type 2 diabetes mellitus with diabetic chronic kidney disease: Secondary | ICD-10-CM | POA: Insufficient documentation

## 2024-04-16 DIAGNOSIS — N186 End stage renal disease: Secondary | ICD-10-CM

## 2024-04-16 DIAGNOSIS — Z940 Kidney transplant status: Secondary | ICD-10-CM | POA: Diagnosis not present

## 2024-04-16 DIAGNOSIS — I509 Heart failure, unspecified: Secondary | ICD-10-CM | POA: Diagnosis not present

## 2024-04-16 DIAGNOSIS — Z992 Dependence on renal dialysis: Secondary | ICD-10-CM | POA: Insufficient documentation

## 2024-04-16 DIAGNOSIS — Z794 Long term (current) use of insulin: Secondary | ICD-10-CM | POA: Insufficient documentation

## 2024-04-16 DIAGNOSIS — I272 Pulmonary hypertension, unspecified: Secondary | ICD-10-CM | POA: Insufficient documentation

## 2024-04-16 HISTORY — PX: INSERTION OF ARTERIOVENOUS (AV) ARTEGRAFT ARM: SHX6779

## 2024-04-16 LAB — GLUCOSE, CAPILLARY
Glucose-Capillary: 153 mg/dL — ABNORMAL HIGH (ref 70–99)
Glucose-Capillary: 162 mg/dL — ABNORMAL HIGH (ref 70–99)
Glucose-Capillary: 172 mg/dL — ABNORMAL HIGH (ref 70–99)

## 2024-04-16 LAB — POCT I-STAT, CHEM 8
BUN: 32 mg/dL — ABNORMAL HIGH (ref 6–20)
Calcium, Ion: 1.05 mmol/L — ABNORMAL LOW (ref 1.15–1.40)
Chloride: 97 mmol/L — ABNORMAL LOW (ref 98–111)
Creatinine, Ser: 6.8 mg/dL — ABNORMAL HIGH (ref 0.61–1.24)
Glucose, Bld: 162 mg/dL — ABNORMAL HIGH (ref 70–99)
HCT: 31 % — ABNORMAL LOW (ref 39.0–52.0)
Hemoglobin: 10.5 g/dL — ABNORMAL LOW (ref 13.0–17.0)
Potassium: 4.7 mmol/L (ref 3.5–5.1)
Sodium: 136 mmol/L (ref 135–145)
TCO2: 30 mmol/L (ref 22–32)

## 2024-04-16 SURGERY — INSERTION, GRAFT, ARTERIOVENOUS, UPPER EXTREMITY
Anesthesia: Monitor Anesthesia Care | Laterality: Right

## 2024-04-16 MED ORDER — SODIUM CHLORIDE 0.9 % IV SOLN
INTRAVENOUS | Status: DC
Start: 2024-04-16 — End: 2024-04-16

## 2024-04-16 MED ORDER — FENTANYL CITRATE (PF) 100 MCG/2ML IJ SOLN
INTRAMUSCULAR | Status: AC
  Administered 2024-04-16: 50 ug via INTRAVENOUS
  Filled 2024-04-16: qty 2

## 2024-04-16 MED ORDER — INSULIN ASPART 100 UNIT/ML IJ SOLN: 0.0000 [IU] | INTRAMUSCULAR | Status: DC | PRN

## 2024-04-16 MED ORDER — CHLORHEXIDINE GLUCONATE 4 % EX SOLN: 60.0000 mL | Freq: Once | CUTANEOUS | Status: DC

## 2024-04-16 MED ORDER — SODIUM CHLORIDE 0.9% FLUSH: 3.0000 mL | INTRAVENOUS | Status: DC | PRN

## 2024-04-16 MED ORDER — MIDAZOLAM HCL 2 MG/2ML IJ SOLN
INTRAMUSCULAR | Status: AC
  Administered 2024-04-16: 1 mg via INTRAVENOUS
  Filled 2024-04-16: qty 2

## 2024-04-16 MED ORDER — CHLORHEXIDINE GLUCONATE 0.12 % MT SOLN
15.0000 mL | Freq: Once | OROMUCOSAL | Status: AC
  Administered 2024-04-16: 15 mL via OROMUCOSAL
  Filled 2024-04-16: qty 15

## 2024-04-16 MED ORDER — OXYCODONE-ACETAMINOPHEN 5-325 MG PO TABS: 1.0000 | ORAL_TABLET | ORAL | 0 refills | Status: AC | PRN

## 2024-04-16 MED ORDER — ONDANSETRON HCL 4 MG/2ML IJ SOLN
INTRAMUSCULAR | Status: DC | PRN
  Administered 2024-04-16: 4 mg via INTRAVENOUS

## 2024-04-16 MED ORDER — HEPARIN 6000 UNIT IRRIGATION SOLUTION
Status: AC
  Filled 2024-04-16: qty 500

## 2024-04-16 MED ORDER — LIDOCAINE 2% (20 MG/ML) 5 ML SYRINGE
INTRAMUSCULAR | Status: AC
  Filled 2024-04-16: qty 5

## 2024-04-16 MED ORDER — HEMOSTATIC AGENTS (NO CHARGE) OPTIME
TOPICAL | Status: DC | PRN
  Administered 2024-04-16: 1 via TOPICAL

## 2024-04-16 MED ORDER — FENTANYL CITRATE (PF) 100 MCG/2ML IJ SOLN: 50.0000 ug | Freq: Once | INTRAMUSCULAR | Status: AC

## 2024-04-16 MED ORDER — MIDAZOLAM HCL 2 MG/2ML IJ SOLN: 1.0000 mg | Freq: Once | INTRAMUSCULAR | Status: AC

## 2024-04-16 MED ORDER — ORAL CARE MOUTH RINSE: 15.0000 mL | Freq: Once | OROMUCOSAL | Status: AC

## 2024-04-16 MED ORDER — MIDAZOLAM HCL 2 MG/2ML IJ SOLN
INTRAMUSCULAR | Status: AC
Start: 2024-04-16 — End: 2024-04-16
  Filled 2024-04-16: qty 2

## 2024-04-16 MED ORDER — HEPARIN 6000 UNIT IRRIGATION SOLUTION
Status: DC | PRN
  Administered 2024-04-16: 1

## 2024-04-16 MED ORDER — 0.9 % SODIUM CHLORIDE (POUR BTL) OPTIME
TOPICAL | Status: DC | PRN
  Administered 2024-04-16: 1000 mL

## 2024-04-16 MED ORDER — HEPARIN SODIUM (PORCINE) 1000 UNIT/ML IJ SOLN
INTRAMUSCULAR | Status: DC | PRN
  Administered 2024-04-16: 5000 [IU] via INTRAVENOUS

## 2024-04-16 MED ORDER — CEFAZOLIN SODIUM-DEXTROSE 2-4 GM/100ML-% IV SOLN
2.0000 g | INTRAVENOUS | Status: AC
  Administered 2024-04-16: 2 g via INTRAVENOUS
  Filled 2024-04-16: qty 100

## 2024-04-16 MED ORDER — LIDOCAINE-EPINEPHRINE (PF) 1.5 %-1:200000 IJ SOLN
INTRAMUSCULAR | Status: DC | PRN
  Administered 2024-04-16: 20 mL via PERINEURAL

## 2024-04-16 MED ORDER — PROPOFOL 500 MG/50ML IV EMUL
INTRAVENOUS | Status: DC | PRN
  Administered 2024-04-16: 150 ug/kg/min via INTRAVENOUS

## 2024-04-16 MED ORDER — PROPOFOL 10 MG/ML IV BOLUS
INTRAVENOUS | Status: DC | PRN
  Administered 2024-04-16: 30 mg via INTRAVENOUS

## 2024-04-16 SURGICAL SUPPLY — 33 items
ARMBAND PINK RESTRICT EXTREMIT (MISCELLANEOUS) ×1 IMPLANT
BENZOIN TINCTURE PRP APPL 2/3 (GAUZE/BANDAGES/DRESSINGS) ×1 IMPLANT
CANISTER SUCTION 3000ML PPV (SUCTIONS) ×1 IMPLANT
CANNULA VESSEL 3MM 2 BLNT TIP (CANNULA) ×1 IMPLANT
CHLORAPREP W/TINT 26 (MISCELLANEOUS) ×1 IMPLANT
CLIP LIGATING EXTRA MED SLVR (CLIP) ×1 IMPLANT
CLIP LIGATING EXTRA SM BLUE (MISCELLANEOUS) ×1 IMPLANT
CLSR STERI-STRIP ANTIMIC 1/2X4 (GAUZE/BANDAGES/DRESSINGS) IMPLANT
ELECTRODE REM PT RTRN 9FT ADLT (ELECTROSURGICAL) ×1 IMPLANT
GLOVE BIO SURGEON STRL SZ8 (GLOVE) ×1 IMPLANT
GOWN STRL REUS W/ TWL LRG LVL3 (GOWN DISPOSABLE) ×2 IMPLANT
GOWN STRL REUS W/ TWL XL LVL3 (GOWN DISPOSABLE) ×1 IMPLANT
GRAFT GORETEX STRETCH 6X40 (Vascular Products) IMPLANT
HEMOSTAT SNOW SURGICEL 2X4 (HEMOSTASIS) IMPLANT
KIT BASIN OR (CUSTOM PROCEDURE TRAY) ×1 IMPLANT
KIT TURNOVER KIT B (KITS) ×1 IMPLANT
NDL 18GX1X1/2 (RX/OR ONLY) (NEEDLE) IMPLANT
NEEDLE 18GX1X1/2 (RX/OR ONLY) (NEEDLE) IMPLANT
NS IRRIG 1000ML POUR BTL (IV SOLUTION) ×1 IMPLANT
PACK CV ACCESS (CUSTOM PROCEDURE TRAY) ×1 IMPLANT
PAD ARMBOARD POSITIONER FOAM (MISCELLANEOUS) ×2 IMPLANT
SHEATH PROBE COVER 6X72 (BAG) ×1 IMPLANT
SLING ARM FOAM STRAP LRG (SOFTGOODS) IMPLANT
STRIP CLOSURE SKIN 1/2X4 (GAUZE/BANDAGES/DRESSINGS) ×1 IMPLANT
SUT MNCRL AB 4-0 PS2 18 (SUTURE) IMPLANT
SUT PROLENE 6 0 BV (SUTURE) IMPLANT
SUT SILK 2 0 SH (SUTURE) IMPLANT
SUT VIC AB 3-0 SH 27X BRD (SUTURE) ×2 IMPLANT
SYR 3ML LL SCALE MARK (SYRINGE) IMPLANT
SYR TOOMEY 50ML (SYRINGE) IMPLANT
TOWEL GREEN STERILE (TOWEL DISPOSABLE) ×1 IMPLANT
UNDERPAD 30X36 HEAVY ABSORB (UNDERPADS AND DIAPERS) ×1 IMPLANT
WATER STERILE IRR 1000ML POUR (IV SOLUTION) ×1 IMPLANT

## 2024-04-16 NOTE — Anesthesia Postprocedure Evaluation (Signed)
 Anesthesia Post Note  Patient: Adam Davis  Procedure(s) Performed: INSERTION, GRAFT, ARTERIOVENOUS, UPPER EXTREMITY (Right)     Patient location during evaluation: PACU Anesthesia Type: Regional and MAC Level of consciousness: awake and alert, oriented and patient cooperative Pain management: pain level controlled Vital Signs Assessment: post-procedure vital signs reviewed and stable Respiratory status: spontaneous breathing, nonlabored ventilation and respiratory function stable Cardiovascular status: blood pressure returned to baseline and stable Postop Assessment: no apparent nausea or vomiting Anesthetic complications: no   There were no known notable events for this encounter.  Last Vitals:  Vitals:   04/16/24 1430 04/16/24 1445  BP: (!) 140/93 (!) 152/99  Pulse: 71 73  Resp: (!) 25 (!) 25  Temp:  36.4 C  SpO2:  97%    Last Pain:  Vitals:   04/16/24 1430  TempSrc:   PainSc: 0-No pain                 Almarie CHRISTELLA Marchi

## 2024-04-16 NOTE — Discharge Instructions (Signed)
[facility]  Discharge Instructions  AV Fistula or Graft Surgery for Dialysis Access  Please refer to the following instructions for your post-procedure care. Your surgeon or physician assistant will discuss any changes with you.  Activity  You may drive the day following your surgery, if you are comfortable and no longer taking prescription pain medication. Resume full activity as the soreness in your incision resolves.  Bathing/Showering  You may shower after you go home. Keep your incision dry for 48 hours. Do not soak in a bathtub, hot tub, or swim until the incision heals completely. You may not shower if you have a hemodialysis catheter.  Incision Care  Clean your incision with mild soap and water after 48 hours. Pat the area dry with a clean towel. You do not need a bandage unless otherwise instructed. Do not apply any ointments or creams to your incision. You may have skin glue on your incision. Do not peel it off. It will come off on its own in about one week. Your arm may swell a bit after surgery. To reduce swelling use pillows to elevate your arm so it is above your heart. Your doctor will tell you if you need to lightly wrap your arm with an ACE bandage.  Diet  Resume your normal diet. There are not special food restrictions following this procedure. In order to heal from your surgery, it is CRITICAL to get adequate nutrition. Your body requires vitamins, minerals, and protein. Vegetables are the best source of vitamins and minerals. Vegetables also provide the perfect balance of protein. Processed food has little nutritional value, so try to avoid this.  Medications  Resume taking all of your medications. If your incision is causing pain, you may take over-the counter pain relievers such as acetaminophen  (Tylenol ). If you were prescribed a stronger pain medication, please be aware these medications can cause nausea and constipation. Prevent  nausea by taking the medication with a snack or meal. Avoid constipation by drinking plenty of fluids and eating foods with high amount of fiber, such as fruits, vegetables, and grains.  Do not take Tylenol  if you are taking prescription pain medications.  Follow up Your surgeon may want to see you in the office following your access surgery. If so, this will be arranged at the time of your surgery.  Please call us  immediately for any of the following conditions:  Increased pain, redness, drainage (pus) from your incision site Fever of 101 degrees or higher Severe or worsening pain at your incision site Hand pain or numbness.  Reduce your risk of vascular disease:  Stop smoking. If you would like help, call [facility] at 1-800-QUIT-NOW (514-854-4676) or [place] at [phone]  Manage your cholesterol Maintain a desired weight Control your diabetes Keep your blood pressure down  Dialysis  It will take several weeks to several months for your new dialysis access to be ready for use. Your surgeon will determine when it is okay to use it. Your nephrologist will continue to direct your dialysis. You can continue to use your Permcath until your new access is ready for use.   04/16/2024 Adam Davis [id number] August 07, 1976  Surgeon(s): Magda Debby SAILOR, MD  Procedure(s): INSERTION, GRAFT, ARTERIOVENOUS, UPPER EXTREMITY   May stick graft immediately   May stick graft on designated area only:   X Do not stick right AV graft for 4 weeks    If you have any questions, please call the office at  [phone]. 

## 2024-04-16 NOTE — Anesthesia Procedure Notes (Signed)
 Anesthesia Regional Block: Supraclavicular block   Pre-Anesthetic Checklist: , timeout performed,  Correct Patient, Correct Site, Correct Laterality,  Correct Procedure, Correct Position, site marked,  Risks and benefits discussed,  Surgical consent,  Pre-op  evaluation,  At surgeon's request and post-op pain management  Laterality: Right  Prep: Maximum Sterile Barrier Precautions used, chloraprep       Needles:  Injection technique: Single-shot  Needle Type: Echogenic Stimulator Needle     Needle Length: 9cm  Needle Gauge: 22     Additional Needles:   Procedures:,,,, ultrasound used (permanent image in chart),,    Narrative:  Start time: 04/16/2024 12:00 PM End time: 04/16/2024 12:05 PM Injection made incrementally with aspirations every 5 mL.  Performed by: Personally  Anesthesiologist: Merla Almarie HERO, DO  Additional Notes: Monitors applied. No increased pain on injection. No increased resistance to injection. Injection made in 5cc increments. Good needle visualization. Patient tolerated procedure well.

## 2024-04-16 NOTE — Op Note (Signed)
 DATE OF SERVICE: 04/16/2024  PATIENT:  Adam Davis  48 y.o. male  PRE-OPERATIVE DIAGNOSIS:  ESRD  POST-OPERATIVE DIAGNOSIS:  Same  PROCEDURE:   Right arm brachial - axillary arteriovenous graft (CPT [phone])  SURGEON:  Surgeons and Role:    * Magda Debby SAILOR, MD - Primary  ASSISTANT: Curry Damme, PA-C  An experienced assistant was required given the complexity of this procedure and the standard of surgical care. My assistant helped with exposure through counter tension, suctioning, ligation and retraction to better visualize the surgical field.  My assistant expedited sewing during the case by following my sutures. Wherever I use the term we in the report, my assistant actively helped me with that portion of the procedure.  ANESTHESIA:   regional and IV sedation  EBL: minimal  BLOOD ADMINISTERED:none  DRAINS: none   LOCAL MEDICATIONS USED:  NONE  SPECIMEN:  none  COUNTS: confirmed correct.  TOURNIQUET:  none  PATIENT DISPOSITION:  PACU - hemodynamically stable.   Delay start of Pharmacological VTE agent (>24hrs) due to surgical blood loss or risk of bleeding: no  INDICATION FOR PROCEDURE: Adam Davis is a 48 y.o. male with ESRD on HD in need of permanent dialysis access. After careful discussion of risks, benefits, and alternatives the patient was offered right arm AV graft. The patient understood and wished to proceed.  OPERATIVE FINDINGS: prior BC AVF transected and used as inflow. Healthy axillary vein. Good result from AV graft.   DESCRIPTION OF PROCEDURE: After identification of the patient in the pre-operative holding area, the patient was transferred to the operating room. The patient was positioned supine on the operating room table. Anesthesia was induced. The right arm was prepped and draped in standard fashion. A surgical pause was performed confirming correct patient, procedure, and operative location.  The right brachiocephalic fistula was  exposed using a transverse incision in the distal arm just above the antecubital fossa.  Incision was carried down through subcutaneous tissue until the fistula was encountered.  The fistula was exposed and encircled with Silastic Vesseloops proximally and distally to the site of planned inflow.   The right axillary vein was exposed using longitudinal incision just below the hairbearing area of the axilla.  Incision was carried down until the brachial sheath was encountered.  The brachial vein was identified, exposed, encircled with Silastic Vesseloops.   Using a curved, sheathed tunneling device, a 4-7 mm tapered Gore-Tex graft was tunneled subcutaneously and gentle arc across the biceps of the right arm.  Patient was then heparinized with 5000 units of IV heparin .   The brachial artery was clamped proximally and distally.  An anterior arteriotomy was made with an 11 blade.  This was extended with Potts scissors.  The 4 mm end of the Gore-Tex graft was spatulated and then anastomosed end-to-side to the brachial arteriotomy using continuous running suture of 6-0 Prolene.  The anastomosis was completed and hemostasis ensured.  The graft was clamped to restore perfusion to the hand.   The brachial vein was clamped proximally and distally.  An anterior venotomy was made with an 11 blade.  This was extended with Potts scissors.  The 7 mm end of the Gore-Tex graft was then anastomosed end to side to the brachial vein venotomy using continuous running suture of 6-0 Prolene.  Immediately prior to completion the anastomosis was de-aired and flushed.  Anastomosis was then completed.  Hemostasis was insured.   Doppler machine was brought onto the field to interrogate  the graft. Doppler flow was noted in the radial artery.  About the arterial anastomosis flow was noted proximal and distal to the arterial anastomosis.  Distal to the venous anastomosis a Doppler bruit was heard.  Satisfied we ended the case here.    Surgical beds were irrigated copiously.  Hemostasis was again ensured in the surgical beds.  The wounds were closed in layers using 3-0 Vicryl and 4-0 Monocryl.  Clean bandages were applied.   Upon completion of the case instrument and sharps counts were confirmed correct. The patient was transferred to the PACU in good condition. I was present for all portions of the procedure.  FOLLOW UP PLAN: Follow up as needed.  Debby SAILOR. Magda, MD [facility] Phone Number: [phone] 04/16/2024 2:37 PM

## 2024-04-16 NOTE — Transfer of Care (Signed)
 Immediate Anesthesia Transfer of Care Note  Patient: Adam Davis  Procedure(s) Performed: INSERTION, GRAFT, ARTERIOVENOUS, UPPER EXTREMITY (Right)  Patient Location: PACU  Anesthesia Type:MAC  Level of Consciousness: awake, alert , and oriented  Airway & Oxygen Therapy: Patient Spontanous Breathing and Patient connected to face mask oxygen  Post-op Assessment: Report given to RN and Post -op Vital signs reviewed and stable  Post vital signs: Reviewed and stable  Last Vitals:  Vitals Value Taken Time  BP 138/93 04/16/24 14:01  Temp    Pulse 70 04/16/24 14:03  Resp 26 04/16/24 14:03  SpO2 100 % 04/16/24 14:03  Vitals shown include unfiled device data.  Last Pain:  Vitals:   04/16/24 1046  TempSrc:   PainSc: 3       Patients Stated Pain Goal: 3 (04/16/24 1046)  Complications: There were no known notable events for this encounter.

## 2024-04-16 NOTE — H&P (Signed)
 See below for h&p details. Plan RUE AVG in operating room today.  Debby SAILOR. Magda, MD [facility] Phone Number: [phone] 04/16/2024 11:48 AM   Office Note     CC:  follow up Requesting Provider:  No ref. provider found  HPI: Adam Davis is a 48 y.o. (10/21/1975) male who presents status post right upper extremity fistulogram with Dr. Magda on 01/11/2024.  Fistulogram demonstrated a slow to mature fistula with no opportunity for intervention and no central stenosis.  He was brought back to the office to discuss new access creation.  He has now had a radiocephalic fistula and a brachiocephalic fistula on the right arm.  He has a right IJ [facility] working well on HD.  He is dialyzing on a Tuesday Thursday Saturday schedule at the [facility] location.  He does not take any blood thinners.   Past Medical History:  Diagnosis Date   Diabetes mellitus    ESRD on hemodialysis (HCC)    H/O acute post-streptococcal glomerulonephritis 1997   History of renal transplant    Hyperlipidemia    Hypertension    Onychomycosis of toenail    Tachycardia     Past Surgical History:  Procedure Laterality Date   A/V FISTULAGRAM Right 01/11/2024   Procedure: A/V Fistulagram;  Surgeon: Magda Debby SAILOR, MD;  [facility];  Service: Cardiovascular;  Laterality: Right;   ANKLE SURGERY     AV FISTULA PLACEMENT  1997   Removed after transplant in 2006   AV FISTULA PLACEMENT Right 09/07/2023   Procedure: RIGHT BRACHIOCEPHALIC FISTULA;  Surgeon: Lanis Fonda BRAVO, MD;  Location: [facility] OR;  Service: Vascular;  Laterality: Right;  Block in the Right arm   INSERTION OF DIALYSIS CATHETER Right 08/06/2023   Procedure: INSERTION OF TUNNELED DIALYSIS CATHETER, RIGHT INTERNAL JUGULAR;  Surgeon: Lanis Fonda BRAVO, MD;  Location: [facility] OR;  Service: Vascular;  Laterality: Right;   IR FLUORO GUIDE CV LINE RIGHT  08/01/2023   IR US  GUIDE VASC ACCESS RIGHT   08/01/2023   KIDNEY SURGERY  2006   KIDNEY TRANSPLANT Left    follows [facility] transplant team    Social History   Socioeconomic History   Marital status: Married    Spouse name: Not on file   Number of children: Not on file   Years of education: Not on file   Highest education level: Not on file  Occupational History   Not on file  Tobacco Use   Smoking status: Never    Passive exposure: Never   Smokeless tobacco: Never  Vaping Use   Vaping status: Never Used  Substance and Sexual Activity   Alcohol use: No   Drug use: No   Sexual activity: Not Currently  Other Topics Concern   Not on file  Social History Narrative   Not on file   Social Drivers of Health   Financial Resource Strain: Patient Declined (11/02/2023)   Received from [facility]   Overall Financial Resource Strain (CARDIA)    Difficulty of Paying Living Expenses: Patient declined  Food Insecurity: No Food Insecurity (01/27/2024)   Hunger Vital Sign    Worried About Running Out of Food in the Last Year: Never true    Ran Out of Food in the Last Year: Never true  Transportation Needs: No Transportation Needs (01/27/2024)   PRAPARE - Transportation    Lack of Transportation (Medical): No    Lack of  Transportation (Non-Medical): No  Physical Activity: Not on file  Stress: Not on file  Social Connections: Not on file  Intimate Partner Violence: Not At Risk (01/27/2024)   Humiliation, Afraid, Rape, and Kick questionnaire    Fear of Current or Ex-Partner: No    Emotionally Abused: No    Physically Abused: No    Sexually Abused: No    Family History  Problem Relation Age of Onset   Diabetes Brother    Diabetes Maternal Grandmother    Hypertension Maternal Grandmother     Current Facility-Administered Medications  Medication Dose Route Frequency Provider Last Rate Last Admin   0.9 %  sodium chloride  infusion   Intravenous Continuous Merla Almarie HERO, DO 10 mL/hr at 04/16/24 1100 New Bag  at 04/16/24 1100   ceFAZolin  (ANCEF ) IVPB 2g/100 mL premix  2 g Intravenous 30 min Pre-Op  Magda Debby SAILOR, MD       chlorhexidine  (HIBICLENS ) 4 % liquid 4 Application  60 mL Topical Once Daschel Roughton N, MD       And   [START ON 04/17/2024] chlorhexidine  (HIBICLENS ) 4 % liquid 4 Application  60 mL Topical Once Shahd Occhipinti N, MD       fentaNYL  (SUBLIMAZE ) 100 MCG/2ML injection            midazolam  (VERSED ) 2 MG/2ML injection            sodium chloride  flush (NS) 0.9 % injection 3-10 mL  3-10 mL Intravenous PRN Magda Debby SAILOR, MD        Allergies  Allergen Reactions   Atorvastatin  Other (See Comments)    Myalgia - resolved with discontinuation     REVIEW OF SYSTEMS:   [X]  denotes positive finding, [ ]  denotes negative finding Cardiac  Comments:  Chest pain or chest pressure:    Shortness of breath upon exertion:    Short of breath when lying flat:    Irregular heart rhythm:        Vascular    Pain in calf, thigh, or hip brought on by ambulation:    Pain in feet at night that wakes you up from your sleep:     Blood clot in your veins:    Leg swelling:         Pulmonary    Oxygen at home:    Productive cough:     Wheezing:         Neurologic    Sudden weakness in arms or legs:     Sudden numbness in arms or legs:     Sudden onset of difficulty speaking or slurred speech:    Temporary loss of vision in one eye:     Problems with dizziness:         Gastrointestinal    Blood in stool:     Vomited blood:         Genitourinary    Burning when urinating:     Blood in urine:        Psychiatric    Major depression:         Hematologic    Bleeding problems:    Problems with blood clotting too easily:        Skin    Rashes or ulcers:        Constitutional    Fever or chills:      PHYSICAL EXAMINATION:  Vitals:   04/15/24 1248 04/16/24 1014  BP:  (!) 155/105  Pulse:  81  Resp:  20  Temp:  97.7 F (36.5 C)  TempSrc:  Axillary  SpO2:  95%  Weight: 59  kg 59 kg  Height: 5' 6 (1.676 m) 5' 6 (1.676 m)    General:  WDWN in NAD; vital signs documented above Gait: Not observed HENT: WNL, normocephalic Pulmonary: normal non-labored breathing Cardiac: regular HR Abdomen: soft, NT, no masses Skin: without rashes Vascular Exam/Pulses: palpable radial pulses Extremities: without ischemic changes, without Gangrene , without cellulitis; without open wounds;  Musculoskeletal: no muscle wasting or atrophy  Neurologic: A&O X 3 Psychiatric:  The pt has Normal affect.   Non-Invasive Vascular Imaging:   Right arm fistula duplex with low flow volume and low flow velocities    ASSESSMENT/PLAN:: 48 y.o. male status post right arm fistulogram who returns to clinic to discuss new dialysis access  Fistulogram demonstrated a slow to mature fistula with no opportunity for intervention.  Fistulogram was also negative for central venous stenosis.  He has now had a radiocephalic and a brachiocephalic fistula on the right arm.  We will plan a right upper arm AV graft on the nondialysis day in the near future.  He will continue on HD via right IJ [facility] for now.  AV graft placement was discussed with the patient and all questions were answered.  He is agreeable to proceed.   Debby LOISE Robertson, PA-C [facility] [phone]  Clinic MD:   [place]

## 2024-04-17 ENCOUNTER — Encounter (HOSPITAL_COMMUNITY): Payer: Self-pay | Admitting: Vascular Surgery

## 2024-04-17 DIAGNOSIS — T8249XA Other complication of vascular dialysis catheter, initial encounter: Secondary | ICD-10-CM | POA: Diagnosis not present

## 2024-04-17 DIAGNOSIS — N186 End stage renal disease: Secondary | ICD-10-CM | POA: Diagnosis not present

## 2024-04-17 DIAGNOSIS — Z992 Dependence on renal dialysis: Secondary | ICD-10-CM | POA: Diagnosis not present

## 2024-04-17 DIAGNOSIS — N2581 Secondary hyperparathyroidism of renal origin: Secondary | ICD-10-CM | POA: Diagnosis not present

## 2024-04-19 DIAGNOSIS — N2581 Secondary hyperparathyroidism of renal origin: Secondary | ICD-10-CM | POA: Diagnosis not present

## 2024-04-19 DIAGNOSIS — Z992 Dependence on renal dialysis: Secondary | ICD-10-CM | POA: Diagnosis not present

## 2024-04-19 DIAGNOSIS — T8249XA Other complication of vascular dialysis catheter, initial encounter: Secondary | ICD-10-CM | POA: Diagnosis not present

## 2024-04-19 DIAGNOSIS — N186 End stage renal disease: Secondary | ICD-10-CM | POA: Diagnosis not present

## 2024-04-22 DIAGNOSIS — Z992 Dependence on renal dialysis: Secondary | ICD-10-CM | POA: Diagnosis not present

## 2024-04-22 DIAGNOSIS — N2581 Secondary hyperparathyroidism of renal origin: Secondary | ICD-10-CM | POA: Diagnosis not present

## 2024-04-22 DIAGNOSIS — N186 End stage renal disease: Secondary | ICD-10-CM | POA: Diagnosis not present

## 2024-04-22 DIAGNOSIS — T8249XA Other complication of vascular dialysis catheter, initial encounter: Secondary | ICD-10-CM | POA: Diagnosis not present

## 2024-04-24 DIAGNOSIS — Z992 Dependence on renal dialysis: Secondary | ICD-10-CM | POA: Diagnosis not present

## 2024-04-24 DIAGNOSIS — T8249XA Other complication of vascular dialysis catheter, initial encounter: Secondary | ICD-10-CM | POA: Diagnosis not present

## 2024-04-24 DIAGNOSIS — N186 End stage renal disease: Secondary | ICD-10-CM | POA: Diagnosis not present

## 2024-04-24 DIAGNOSIS — N2581 Secondary hyperparathyroidism of renal origin: Secondary | ICD-10-CM | POA: Diagnosis not present

## 2024-04-26 DIAGNOSIS — Z992 Dependence on renal dialysis: Secondary | ICD-10-CM | POA: Diagnosis not present

## 2024-04-26 DIAGNOSIS — T8249XA Other complication of vascular dialysis catheter, initial encounter: Secondary | ICD-10-CM | POA: Diagnosis not present

## 2024-04-26 DIAGNOSIS — N186 End stage renal disease: Secondary | ICD-10-CM | POA: Diagnosis not present

## 2024-04-26 DIAGNOSIS — N2581 Secondary hyperparathyroidism of renal origin: Secondary | ICD-10-CM | POA: Diagnosis not present

## 2024-04-29 DIAGNOSIS — Z992 Dependence on renal dialysis: Secondary | ICD-10-CM | POA: Diagnosis not present

## 2024-04-29 DIAGNOSIS — R519 Headache, unspecified: Secondary | ICD-10-CM | POA: Diagnosis not present

## 2024-04-29 DIAGNOSIS — N2581 Secondary hyperparathyroidism of renal origin: Secondary | ICD-10-CM | POA: Diagnosis not present

## 2024-04-29 DIAGNOSIS — T8249XA Other complication of vascular dialysis catheter, initial encounter: Secondary | ICD-10-CM | POA: Diagnosis not present

## 2024-04-29 DIAGNOSIS — N186 End stage renal disease: Secondary | ICD-10-CM | POA: Diagnosis not present

## 2024-05-01 DIAGNOSIS — N186 End stage renal disease: Secondary | ICD-10-CM | POA: Diagnosis not present

## 2024-05-01 DIAGNOSIS — T8249XA Other complication of vascular dialysis catheter, initial encounter: Secondary | ICD-10-CM | POA: Diagnosis not present

## 2024-05-01 DIAGNOSIS — N2581 Secondary hyperparathyroidism of renal origin: Secondary | ICD-10-CM | POA: Diagnosis not present

## 2024-05-01 DIAGNOSIS — Z992 Dependence on renal dialysis: Secondary | ICD-10-CM | POA: Diagnosis not present

## 2024-05-01 DIAGNOSIS — R519 Headache, unspecified: Secondary | ICD-10-CM | POA: Diagnosis not present

## 2024-05-03 DIAGNOSIS — Z992 Dependence on renal dialysis: Secondary | ICD-10-CM | POA: Diagnosis not present

## 2024-05-03 DIAGNOSIS — T8249XA Other complication of vascular dialysis catheter, initial encounter: Secondary | ICD-10-CM | POA: Diagnosis not present

## 2024-05-03 DIAGNOSIS — N2581 Secondary hyperparathyroidism of renal origin: Secondary | ICD-10-CM | POA: Diagnosis not present

## 2024-05-03 DIAGNOSIS — N186 End stage renal disease: Secondary | ICD-10-CM | POA: Diagnosis not present

## 2024-05-03 DIAGNOSIS — R519 Headache, unspecified: Secondary | ICD-10-CM | POA: Diagnosis not present

## 2024-05-06 DIAGNOSIS — R519 Headache, unspecified: Secondary | ICD-10-CM | POA: Diagnosis not present

## 2024-05-06 DIAGNOSIS — T8249XA Other complication of vascular dialysis catheter, initial encounter: Secondary | ICD-10-CM | POA: Diagnosis not present

## 2024-05-06 DIAGNOSIS — N2581 Secondary hyperparathyroidism of renal origin: Secondary | ICD-10-CM | POA: Diagnosis not present

## 2024-05-06 DIAGNOSIS — Z992 Dependence on renal dialysis: Secondary | ICD-10-CM | POA: Diagnosis not present

## 2024-05-06 DIAGNOSIS — N186 End stage renal disease: Secondary | ICD-10-CM | POA: Diagnosis not present

## 2024-05-08 DIAGNOSIS — Z992 Dependence on renal dialysis: Secondary | ICD-10-CM | POA: Diagnosis not present

## 2024-05-08 DIAGNOSIS — T8249XA Other complication of vascular dialysis catheter, initial encounter: Secondary | ICD-10-CM | POA: Diagnosis not present

## 2024-05-08 DIAGNOSIS — N2581 Secondary hyperparathyroidism of renal origin: Secondary | ICD-10-CM | POA: Diagnosis not present

## 2024-05-08 DIAGNOSIS — N186 End stage renal disease: Secondary | ICD-10-CM | POA: Diagnosis not present

## 2024-05-08 DIAGNOSIS — R519 Headache, unspecified: Secondary | ICD-10-CM | POA: Diagnosis not present

## 2024-05-10 DIAGNOSIS — T8249XA Other complication of vascular dialysis catheter, initial encounter: Secondary | ICD-10-CM | POA: Diagnosis not present

## 2024-05-10 DIAGNOSIS — Z992 Dependence on renal dialysis: Secondary | ICD-10-CM | POA: Diagnosis not present

## 2024-05-10 DIAGNOSIS — N2581 Secondary hyperparathyroidism of renal origin: Secondary | ICD-10-CM | POA: Diagnosis not present

## 2024-05-10 DIAGNOSIS — N186 End stage renal disease: Secondary | ICD-10-CM | POA: Diagnosis not present

## 2024-05-10 DIAGNOSIS — R519 Headache, unspecified: Secondary | ICD-10-CM | POA: Diagnosis not present

## 2024-05-13 DIAGNOSIS — N186 End stage renal disease: Secondary | ICD-10-CM | POA: Diagnosis not present

## 2024-05-13 DIAGNOSIS — Z992 Dependence on renal dialysis: Secondary | ICD-10-CM | POA: Diagnosis not present

## 2024-05-13 DIAGNOSIS — T8249XA Other complication of vascular dialysis catheter, initial encounter: Secondary | ICD-10-CM | POA: Diagnosis not present

## 2024-05-13 DIAGNOSIS — N2581 Secondary hyperparathyroidism of renal origin: Secondary | ICD-10-CM | POA: Diagnosis not present

## 2024-05-15 DIAGNOSIS — T8249XA Other complication of vascular dialysis catheter, initial encounter: Secondary | ICD-10-CM | POA: Diagnosis not present

## 2024-05-15 DIAGNOSIS — Z992 Dependence on renal dialysis: Secondary | ICD-10-CM | POA: Diagnosis not present

## 2024-05-15 DIAGNOSIS — N2581 Secondary hyperparathyroidism of renal origin: Secondary | ICD-10-CM | POA: Diagnosis not present

## 2024-05-15 DIAGNOSIS — N186 End stage renal disease: Secondary | ICD-10-CM | POA: Diagnosis not present

## 2024-05-17 DIAGNOSIS — Z992 Dependence on renal dialysis: Secondary | ICD-10-CM | POA: Diagnosis not present

## 2024-05-17 DIAGNOSIS — N186 End stage renal disease: Secondary | ICD-10-CM | POA: Diagnosis not present

## 2024-05-17 DIAGNOSIS — T8249XA Other complication of vascular dialysis catheter, initial encounter: Secondary | ICD-10-CM | POA: Diagnosis not present

## 2024-05-17 DIAGNOSIS — N2581 Secondary hyperparathyroidism of renal origin: Secondary | ICD-10-CM | POA: Diagnosis not present

## 2024-05-20 DIAGNOSIS — N2581 Secondary hyperparathyroidism of renal origin: Secondary | ICD-10-CM | POA: Diagnosis not present

## 2024-05-20 DIAGNOSIS — R519 Headache, unspecified: Secondary | ICD-10-CM | POA: Diagnosis not present

## 2024-05-20 DIAGNOSIS — N186 End stage renal disease: Secondary | ICD-10-CM | POA: Diagnosis not present

## 2024-05-20 DIAGNOSIS — Z992 Dependence on renal dialysis: Secondary | ICD-10-CM | POA: Diagnosis not present

## 2024-05-20 DIAGNOSIS — T8249XA Other complication of vascular dialysis catheter, initial encounter: Secondary | ICD-10-CM | POA: Diagnosis not present

## 2024-05-21 ENCOUNTER — Other Ambulatory Visit (HOSPITAL_COMMUNITY): Payer: Self-pay

## 2024-05-22 DIAGNOSIS — Z992 Dependence on renal dialysis: Secondary | ICD-10-CM | POA: Diagnosis not present

## 2024-05-22 DIAGNOSIS — N186 End stage renal disease: Secondary | ICD-10-CM | POA: Diagnosis not present

## 2024-05-22 DIAGNOSIS — R519 Headache, unspecified: Secondary | ICD-10-CM | POA: Diagnosis not present

## 2024-05-22 DIAGNOSIS — T8249XA Other complication of vascular dialysis catheter, initial encounter: Secondary | ICD-10-CM | POA: Diagnosis not present

## 2024-05-22 DIAGNOSIS — N2581 Secondary hyperparathyroidism of renal origin: Secondary | ICD-10-CM | POA: Diagnosis not present

## 2024-05-24 DIAGNOSIS — R519 Headache, unspecified: Secondary | ICD-10-CM | POA: Diagnosis not present

## 2024-05-24 DIAGNOSIS — N186 End stage renal disease: Secondary | ICD-10-CM | POA: Diagnosis not present

## 2024-05-24 DIAGNOSIS — N2581 Secondary hyperparathyroidism of renal origin: Secondary | ICD-10-CM | POA: Diagnosis not present

## 2024-05-24 DIAGNOSIS — Z992 Dependence on renal dialysis: Secondary | ICD-10-CM | POA: Diagnosis not present

## 2024-05-24 DIAGNOSIS — T8249XA Other complication of vascular dialysis catheter, initial encounter: Secondary | ICD-10-CM | POA: Diagnosis not present

## 2024-05-27 DIAGNOSIS — T8249XA Other complication of vascular dialysis catheter, initial encounter: Secondary | ICD-10-CM | POA: Diagnosis not present

## 2024-05-27 DIAGNOSIS — N2581 Secondary hyperparathyroidism of renal origin: Secondary | ICD-10-CM | POA: Diagnosis not present

## 2024-05-27 DIAGNOSIS — Z992 Dependence on renal dialysis: Secondary | ICD-10-CM | POA: Diagnosis not present

## 2024-05-27 DIAGNOSIS — T8612 Kidney transplant failure: Secondary | ICD-10-CM | POA: Diagnosis not present

## 2024-05-27 DIAGNOSIS — N186 End stage renal disease: Secondary | ICD-10-CM | POA: Diagnosis not present

## 2024-05-29 DIAGNOSIS — N2581 Secondary hyperparathyroidism of renal origin: Secondary | ICD-10-CM | POA: Diagnosis not present

## 2024-05-29 DIAGNOSIS — R519 Headache, unspecified: Secondary | ICD-10-CM | POA: Diagnosis not present

## 2024-05-29 DIAGNOSIS — Z992 Dependence on renal dialysis: Secondary | ICD-10-CM | POA: Diagnosis not present

## 2024-05-29 DIAGNOSIS — T8249XA Other complication of vascular dialysis catheter, initial encounter: Secondary | ICD-10-CM | POA: Diagnosis not present

## 2024-05-29 DIAGNOSIS — N186 End stage renal disease: Secondary | ICD-10-CM | POA: Diagnosis not present

## 2024-05-29 DIAGNOSIS — L299 Pruritus, unspecified: Secondary | ICD-10-CM | POA: Diagnosis not present

## 2024-05-31 DIAGNOSIS — T8249XA Other complication of vascular dialysis catheter, initial encounter: Secondary | ICD-10-CM | POA: Diagnosis not present

## 2024-05-31 DIAGNOSIS — Z992 Dependence on renal dialysis: Secondary | ICD-10-CM | POA: Diagnosis not present

## 2024-05-31 DIAGNOSIS — N2581 Secondary hyperparathyroidism of renal origin: Secondary | ICD-10-CM | POA: Diagnosis not present

## 2024-05-31 DIAGNOSIS — L299 Pruritus, unspecified: Secondary | ICD-10-CM | POA: Diagnosis not present

## 2024-05-31 DIAGNOSIS — R519 Headache, unspecified: Secondary | ICD-10-CM | POA: Diagnosis not present

## 2024-05-31 DIAGNOSIS — N186 End stage renal disease: Secondary | ICD-10-CM | POA: Diagnosis not present

## 2024-06-03 DIAGNOSIS — T8249XA Other complication of vascular dialysis catheter, initial encounter: Secondary | ICD-10-CM | POA: Diagnosis not present

## 2024-06-03 DIAGNOSIS — N2581 Secondary hyperparathyroidism of renal origin: Secondary | ICD-10-CM | POA: Diagnosis not present

## 2024-06-03 DIAGNOSIS — N186 End stage renal disease: Secondary | ICD-10-CM | POA: Diagnosis not present

## 2024-06-03 DIAGNOSIS — R52 Pain, unspecified: Secondary | ICD-10-CM | POA: Diagnosis not present

## 2024-06-03 DIAGNOSIS — Z992 Dependence on renal dialysis: Secondary | ICD-10-CM | POA: Diagnosis not present

## 2024-06-05 DIAGNOSIS — T8249XA Other complication of vascular dialysis catheter, initial encounter: Secondary | ICD-10-CM | POA: Diagnosis not present

## 2024-06-05 DIAGNOSIS — N186 End stage renal disease: Secondary | ICD-10-CM | POA: Diagnosis not present

## 2024-06-05 DIAGNOSIS — R52 Pain, unspecified: Secondary | ICD-10-CM | POA: Diagnosis not present

## 2024-06-05 DIAGNOSIS — Z992 Dependence on renal dialysis: Secondary | ICD-10-CM | POA: Diagnosis not present

## 2024-06-05 DIAGNOSIS — N2581 Secondary hyperparathyroidism of renal origin: Secondary | ICD-10-CM | POA: Diagnosis not present

## 2024-06-07 DIAGNOSIS — N186 End stage renal disease: Secondary | ICD-10-CM | POA: Diagnosis not present

## 2024-06-07 DIAGNOSIS — R52 Pain, unspecified: Secondary | ICD-10-CM | POA: Diagnosis not present

## 2024-06-07 DIAGNOSIS — N2581 Secondary hyperparathyroidism of renal origin: Secondary | ICD-10-CM | POA: Diagnosis not present

## 2024-06-07 DIAGNOSIS — Z992 Dependence on renal dialysis: Secondary | ICD-10-CM | POA: Diagnosis not present

## 2024-06-07 DIAGNOSIS — T8249XA Other complication of vascular dialysis catheter, initial encounter: Secondary | ICD-10-CM | POA: Diagnosis not present

## 2024-06-10 DIAGNOSIS — N2581 Secondary hyperparathyroidism of renal origin: Secondary | ICD-10-CM | POA: Diagnosis not present

## 2024-06-10 DIAGNOSIS — L299 Pruritus, unspecified: Secondary | ICD-10-CM | POA: Diagnosis not present

## 2024-06-10 DIAGNOSIS — N186 End stage renal disease: Secondary | ICD-10-CM | POA: Diagnosis not present

## 2024-06-10 DIAGNOSIS — T8249XA Other complication of vascular dialysis catheter, initial encounter: Secondary | ICD-10-CM | POA: Diagnosis not present

## 2024-06-10 DIAGNOSIS — Z992 Dependence on renal dialysis: Secondary | ICD-10-CM | POA: Diagnosis not present

## 2024-06-12 DIAGNOSIS — N2581 Secondary hyperparathyroidism of renal origin: Secondary | ICD-10-CM | POA: Diagnosis not present

## 2024-06-12 DIAGNOSIS — N186 End stage renal disease: Secondary | ICD-10-CM | POA: Diagnosis not present

## 2024-06-12 DIAGNOSIS — T8249XA Other complication of vascular dialysis catheter, initial encounter: Secondary | ICD-10-CM | POA: Diagnosis not present

## 2024-06-12 DIAGNOSIS — L299 Pruritus, unspecified: Secondary | ICD-10-CM | POA: Diagnosis not present

## 2024-06-12 DIAGNOSIS — Z992 Dependence on renal dialysis: Secondary | ICD-10-CM | POA: Diagnosis not present

## 2024-06-14 DIAGNOSIS — L299 Pruritus, unspecified: Secondary | ICD-10-CM | POA: Diagnosis not present

## 2024-06-14 DIAGNOSIS — N2581 Secondary hyperparathyroidism of renal origin: Secondary | ICD-10-CM | POA: Diagnosis not present

## 2024-06-14 DIAGNOSIS — Z992 Dependence on renal dialysis: Secondary | ICD-10-CM | POA: Diagnosis not present

## 2024-06-14 DIAGNOSIS — N186 End stage renal disease: Secondary | ICD-10-CM | POA: Diagnosis not present

## 2024-06-14 DIAGNOSIS — T8249XA Other complication of vascular dialysis catheter, initial encounter: Secondary | ICD-10-CM | POA: Diagnosis not present

## 2024-06-17 DIAGNOSIS — T8249XA Other complication of vascular dialysis catheter, initial encounter: Secondary | ICD-10-CM | POA: Diagnosis not present

## 2024-06-17 DIAGNOSIS — N2581 Secondary hyperparathyroidism of renal origin: Secondary | ICD-10-CM | POA: Diagnosis not present

## 2024-06-17 DIAGNOSIS — N186 End stage renal disease: Secondary | ICD-10-CM | POA: Diagnosis not present

## 2024-06-17 DIAGNOSIS — Z992 Dependence on renal dialysis: Secondary | ICD-10-CM | POA: Diagnosis not present

## 2024-06-19 DIAGNOSIS — T8249XA Other complication of vascular dialysis catheter, initial encounter: Secondary | ICD-10-CM | POA: Diagnosis not present

## 2024-06-19 DIAGNOSIS — Z992 Dependence on renal dialysis: Secondary | ICD-10-CM | POA: Diagnosis not present

## 2024-06-19 DIAGNOSIS — N186 End stage renal disease: Secondary | ICD-10-CM | POA: Diagnosis not present

## 2024-06-19 DIAGNOSIS — N2581 Secondary hyperparathyroidism of renal origin: Secondary | ICD-10-CM | POA: Diagnosis not present

## 2024-06-21 DIAGNOSIS — Z992 Dependence on renal dialysis: Secondary | ICD-10-CM | POA: Diagnosis not present

## 2024-06-21 DIAGNOSIS — T8249XA Other complication of vascular dialysis catheter, initial encounter: Secondary | ICD-10-CM | POA: Diagnosis not present

## 2024-06-21 DIAGNOSIS — N2581 Secondary hyperparathyroidism of renal origin: Secondary | ICD-10-CM | POA: Diagnosis not present

## 2024-06-21 DIAGNOSIS — N186 End stage renal disease: Secondary | ICD-10-CM | POA: Diagnosis not present

## 2024-06-24 DIAGNOSIS — L299 Pruritus, unspecified: Secondary | ICD-10-CM | POA: Diagnosis not present

## 2024-06-24 DIAGNOSIS — T8249XA Other complication of vascular dialysis catheter, initial encounter: Secondary | ICD-10-CM | POA: Diagnosis not present

## 2024-06-24 DIAGNOSIS — Z992 Dependence on renal dialysis: Secondary | ICD-10-CM | POA: Diagnosis not present

## 2024-06-24 DIAGNOSIS — N2581 Secondary hyperparathyroidism of renal origin: Secondary | ICD-10-CM | POA: Diagnosis not present

## 2024-06-24 DIAGNOSIS — N186 End stage renal disease: Secondary | ICD-10-CM | POA: Diagnosis not present

## 2024-06-25 ENCOUNTER — Ambulatory Visit: Admitting: Podiatry

## 2024-06-26 DIAGNOSIS — N186 End stage renal disease: Secondary | ICD-10-CM | POA: Diagnosis not present

## 2024-06-26 DIAGNOSIS — T8249XA Other complication of vascular dialysis catheter, initial encounter: Secondary | ICD-10-CM | POA: Diagnosis not present

## 2024-06-26 DIAGNOSIS — N2581 Secondary hyperparathyroidism of renal origin: Secondary | ICD-10-CM | POA: Diagnosis not present

## 2024-06-26 DIAGNOSIS — L299 Pruritus, unspecified: Secondary | ICD-10-CM | POA: Diagnosis not present

## 2024-06-26 DIAGNOSIS — Z992 Dependence on renal dialysis: Secondary | ICD-10-CM | POA: Diagnosis not present

## 2024-06-27 DIAGNOSIS — Z992 Dependence on renal dialysis: Secondary | ICD-10-CM | POA: Diagnosis not present

## 2024-06-27 DIAGNOSIS — N186 End stage renal disease: Secondary | ICD-10-CM | POA: Diagnosis not present

## 2024-06-27 DIAGNOSIS — T8612 Kidney transplant failure: Secondary | ICD-10-CM | POA: Diagnosis not present

## 2024-06-28 DIAGNOSIS — T8249XA Other complication of vascular dialysis catheter, initial encounter: Secondary | ICD-10-CM | POA: Diagnosis not present

## 2024-06-28 DIAGNOSIS — N2581 Secondary hyperparathyroidism of renal origin: Secondary | ICD-10-CM | POA: Diagnosis not present

## 2024-06-28 DIAGNOSIS — Z992 Dependence on renal dialysis: Secondary | ICD-10-CM | POA: Diagnosis not present

## 2024-06-28 DIAGNOSIS — N186 End stage renal disease: Secondary | ICD-10-CM | POA: Diagnosis not present

## 2024-07-01 DIAGNOSIS — Z992 Dependence on renal dialysis: Secondary | ICD-10-CM | POA: Diagnosis not present

## 2024-07-01 DIAGNOSIS — T8249XA Other complication of vascular dialysis catheter, initial encounter: Secondary | ICD-10-CM | POA: Diagnosis not present

## 2024-07-01 DIAGNOSIS — N186 End stage renal disease: Secondary | ICD-10-CM | POA: Diagnosis not present

## 2024-07-01 DIAGNOSIS — N2581 Secondary hyperparathyroidism of renal origin: Secondary | ICD-10-CM | POA: Diagnosis not present

## 2024-07-02 ENCOUNTER — Ambulatory Visit (INDEPENDENT_AMBULATORY_CARE_PROVIDER_SITE_OTHER): Admitting: Podiatry

## 2024-07-02 DIAGNOSIS — M79671 Pain in right foot: Secondary | ICD-10-CM

## 2024-07-02 DIAGNOSIS — M79672 Pain in left foot: Secondary | ICD-10-CM | POA: Diagnosis not present

## 2024-07-02 DIAGNOSIS — B351 Tinea unguium: Secondary | ICD-10-CM

## 2024-07-02 DIAGNOSIS — L600 Ingrowing nail: Secondary | ICD-10-CM | POA: Diagnosis not present

## 2024-07-02 NOTE — Progress Notes (Signed)
 Patient presents with complaint of painful thick nails.  Painful walking wearing shoes.  Nails been bad for many years.  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  Does not complain of any burning or numbness in the feet.  No history of infections or ulcerations in the feet.   Physical exam:  General appearance: Pleasant, and in no acute distress. AOx3.  Vascular: Pedal pulses: DP 2/4 bilaterally, PT 2/4 bilaterally. Minimal edema lower legs bilaterally. Capillary fill time immediate bilaterally.  Neurological: Light touch intact feet bilaterally.  Normal Achilles reflex bilaterally.  No clonus or spasticity noted.  Vibratory sensation intact bilaterally.  Monofilament sensation intact bilaterally  Dermatologic:   Thick dystrophic discolored nails 1 through 5 bilaterally with fungal changes and subungual debris and discoloration.  Skin normal temperature bilaterally.  Skin normal color, tone, and texture bilaterally.  No hair growth lower extremity bilaterally.  Noticed dystrophic thickened nails with ingrown borders hallux and second toes bilaterally.  Musculoskeletal: Hallux valgus deformity bilaterally.  Normal muscle strength lower extremity bilaterally   Diagnosis: 1.  Onychomycosis 1 through 5 bilaterally 2.  Pain feet bilaterally 3.  Ingrown nails bilaterally  Plan: -New patient office visit for evaluation and treatment.  Level 3.  Modifier 25 -Discussed the nails, and the problems he is having with them.  Would recommend periodic debridement of the nails with the severely dystrophic ingrown nails on the hallux and second toes get worse matricectomy could be considered.  Discussed with involved in doing this.  -Debrided painful onychomycotic nails 1 through 5 bilaterally utilizing nail nippers and a rotary powered bur.  Return 3 months [facility]

## 2024-07-03 DIAGNOSIS — N186 End stage renal disease: Secondary | ICD-10-CM | POA: Diagnosis not present

## 2024-07-03 DIAGNOSIS — N2581 Secondary hyperparathyroidism of renal origin: Secondary | ICD-10-CM | POA: Diagnosis not present

## 2024-07-03 DIAGNOSIS — Z992 Dependence on renal dialysis: Secondary | ICD-10-CM | POA: Diagnosis not present

## 2024-07-03 DIAGNOSIS — T8249XA Other complication of vascular dialysis catheter, initial encounter: Secondary | ICD-10-CM | POA: Diagnosis not present

## 2024-07-05 DIAGNOSIS — T8249XA Other complication of vascular dialysis catheter, initial encounter: Secondary | ICD-10-CM | POA: Diagnosis not present

## 2024-07-05 DIAGNOSIS — N2581 Secondary hyperparathyroidism of renal origin: Secondary | ICD-10-CM | POA: Diagnosis not present

## 2024-07-05 DIAGNOSIS — N186 End stage renal disease: Secondary | ICD-10-CM | POA: Diagnosis not present

## 2024-07-05 DIAGNOSIS — Z992 Dependence on renal dialysis: Secondary | ICD-10-CM | POA: Diagnosis not present

## 2024-07-08 DIAGNOSIS — N2581 Secondary hyperparathyroidism of renal origin: Secondary | ICD-10-CM | POA: Diagnosis not present

## 2024-07-08 DIAGNOSIS — T8249XA Other complication of vascular dialysis catheter, initial encounter: Secondary | ICD-10-CM | POA: Diagnosis not present

## 2024-07-08 DIAGNOSIS — Z992 Dependence on renal dialysis: Secondary | ICD-10-CM | POA: Diagnosis not present

## 2024-07-08 DIAGNOSIS — N186 End stage renal disease: Secondary | ICD-10-CM | POA: Diagnosis not present

## 2024-07-10 DIAGNOSIS — T8249XA Other complication of vascular dialysis catheter, initial encounter: Secondary | ICD-10-CM | POA: Diagnosis not present

## 2024-07-10 DIAGNOSIS — N186 End stage renal disease: Secondary | ICD-10-CM | POA: Diagnosis not present

## 2024-07-10 DIAGNOSIS — N2581 Secondary hyperparathyroidism of renal origin: Secondary | ICD-10-CM | POA: Diagnosis not present

## 2024-07-10 DIAGNOSIS — Z992 Dependence on renal dialysis: Secondary | ICD-10-CM | POA: Diagnosis not present

## 2024-07-11 ENCOUNTER — Other Ambulatory Visit: Payer: Self-pay

## 2024-07-12 DIAGNOSIS — T8249XA Other complication of vascular dialysis catheter, initial encounter: Secondary | ICD-10-CM | POA: Diagnosis not present

## 2024-07-12 DIAGNOSIS — N2581 Secondary hyperparathyroidism of renal origin: Secondary | ICD-10-CM | POA: Diagnosis not present

## 2024-07-12 DIAGNOSIS — Z992 Dependence on renal dialysis: Secondary | ICD-10-CM | POA: Diagnosis not present

## 2024-07-12 DIAGNOSIS — N186 End stage renal disease: Secondary | ICD-10-CM | POA: Diagnosis not present

## 2024-07-15 DIAGNOSIS — N2581 Secondary hyperparathyroidism of renal origin: Secondary | ICD-10-CM | POA: Diagnosis not present

## 2024-07-15 DIAGNOSIS — L299 Pruritus, unspecified: Secondary | ICD-10-CM | POA: Diagnosis not present

## 2024-07-15 DIAGNOSIS — T8249XA Other complication of vascular dialysis catheter, initial encounter: Secondary | ICD-10-CM | POA: Diagnosis not present

## 2024-07-15 DIAGNOSIS — Z992 Dependence on renal dialysis: Secondary | ICD-10-CM | POA: Diagnosis not present

## 2024-07-15 DIAGNOSIS — N186 End stage renal disease: Secondary | ICD-10-CM | POA: Diagnosis not present

## 2024-07-15 NOTE — Progress Notes (Addendum)
 Established Dialysis Access   History of Present Illness   Adam Davis is a 48 y.o. ([birth year]) male who presents for evaluation AV graft to see if it is ready for use. He is s/p Right arm brachial - axillary arteriovenous graft by Dr. Magda on 04/16/24. He previously had a radiocephalic fistula and a brachiocephalic fistula on the right arm. He denies any signs of symptoms of steal. His incisions are well healed. He has a right IJ [facility] working well on HD. He is dialyzing on a Tuesday Thursday Saturday schedule at the [facility] location.   Current Outpatient Medications  Medication Sig Dispense Refill   amLODipine  (NORVASC ) 10 MG tablet Take 10 mg by mouth daily.     Blood Glucose Monitoring Suppl (BLOOD GLUCOSE MONITOR SYSTEM) w/Device KIT Use in the morning, at noon, and at bedtime. (Patient not taking: Reported on 07/02/2024) 1 kit 0   carvedilol  (COREG ) 25 MG tablet Take 1 tablet (25 mg total) by mouth 2 (two) times daily with a meal. 60 tablet 0   Continuous Glucose Sensor (DEXCOM G7 SENSOR) MISC Use as directed 3 each 11   insulin  glargine-yfgn (SEMGLEE ) 100 UNIT/ML Pen Inject 10 Units into the skin daily. 15 mL 1   Insulin  Pen Needle 32G X 4 MM MISC Use with Semglee  pen 100 each 0   multivitamin (RENA-VIT) TABS tablet Take 1 tablet by mouth daily. 30 tablet 0   mycophenolate  (CELLCEPT ) 250 MG capsule Take 2 capsules (500 mg total) by mouth 2 (two) times daily. 120 capsule 0   oxyCODONE -acetaminophen  (PERCOCET) 5-325 MG tablet Take 1 tablet by mouth every 4 (four) hours as needed for severe pain (pain score 7-10). 20 tablet 0   rosuvastatin  (CRESTOR ) 10 MG tablet Take one tablet every Monday, Wednesday, and Friday. 30 tablet 1   sevelamer  carbonate (RENVELA ) 800 MG tablet Take 1 tablet (800 mg total) by mouth 3 (three) times daily with meals. 90 tablet 0   tacrolimus  (PROGRAF ) 1 MG capsule Take 1 mg by mouth 2 (two) times daily.     valsartan  (DIOVAN ) 320 MG tablet Take 1  tablet (320 mg total) by mouth every evening. 30 tablet 0   No current facility-administered medications for this visit.    On ROS today: negative unless stated in HPI   Physical Examination   Vitals:   07/21/24 0834  BP: (!) 163/93  Pulse: 79  Temp: 98.2 F (36.8 C)  TempSrc: Temporal  Weight: 138 lb (62.6 kg)   Body mass index is 22.27 kg/m.  General WDWN, in no distress  Pulmonary Non labored  Cardiac regular  Vascular 2+ right radial pulse, graft has great thrill, audible bruit. Incisions all well healed   Musculo- skeletal M/S 5/5 throughout  , Extremities without ischemic changes    Neurologic A&O; CN grossly intact    Medical Decision Making   Adam Davis is a 48 y.o. male who presents for evaluation AV graft to see if it is ready for use. He is s/p Right arm brachial - axillary arteriovenous graft by Dr. Magda on 04/16/24. His incisions are well healed. He is without signs or symptoms of steal. His graft is ready for use immediately. At the discretion of the dialysis center his right internal jugular TDC can be removed when they feel that the function of the right AV graft is adequate. He can follow up with us  as needed.   Teretha Damme, PA-C [facility] Office: [phone]  On call MD: Gretta

## 2024-07-17 DIAGNOSIS — N186 End stage renal disease: Secondary | ICD-10-CM | POA: Diagnosis not present

## 2024-07-17 DIAGNOSIS — Z992 Dependence on renal dialysis: Secondary | ICD-10-CM | POA: Diagnosis not present

## 2024-07-17 DIAGNOSIS — L299 Pruritus, unspecified: Secondary | ICD-10-CM | POA: Diagnosis not present

## 2024-07-17 DIAGNOSIS — N2581 Secondary hyperparathyroidism of renal origin: Secondary | ICD-10-CM | POA: Diagnosis not present

## 2024-07-17 DIAGNOSIS — T8249XA Other complication of vascular dialysis catheter, initial encounter: Secondary | ICD-10-CM | POA: Diagnosis not present

## 2024-07-19 DIAGNOSIS — L299 Pruritus, unspecified: Secondary | ICD-10-CM | POA: Diagnosis not present

## 2024-07-19 DIAGNOSIS — N2581 Secondary hyperparathyroidism of renal origin: Secondary | ICD-10-CM | POA: Diagnosis not present

## 2024-07-19 DIAGNOSIS — Z992 Dependence on renal dialysis: Secondary | ICD-10-CM | POA: Diagnosis not present

## 2024-07-19 DIAGNOSIS — N186 End stage renal disease: Secondary | ICD-10-CM | POA: Diagnosis not present

## 2024-07-19 DIAGNOSIS — T8249XA Other complication of vascular dialysis catheter, initial encounter: Secondary | ICD-10-CM | POA: Diagnosis not present

## 2024-07-21 ENCOUNTER — Ambulatory Visit: Attending: Surgery | Admitting: Physician Assistant

## 2024-07-21 VITALS — BP 163/93 | HR 79 | Temp 98.2°F | Wt 138.0 lb

## 2024-07-21 DIAGNOSIS — Z992 Dependence on renal dialysis: Secondary | ICD-10-CM | POA: Diagnosis not present

## 2024-07-21 DIAGNOSIS — L299 Pruritus, unspecified: Secondary | ICD-10-CM | POA: Diagnosis not present

## 2024-07-21 DIAGNOSIS — N2581 Secondary hyperparathyroidism of renal origin: Secondary | ICD-10-CM | POA: Diagnosis not present

## 2024-07-21 DIAGNOSIS — N186 End stage renal disease: Secondary | ICD-10-CM | POA: Insufficient documentation

## 2024-07-21 DIAGNOSIS — T8249XA Other complication of vascular dialysis catheter, initial encounter: Secondary | ICD-10-CM | POA: Diagnosis not present

## 2024-07-23 DIAGNOSIS — N2581 Secondary hyperparathyroidism of renal origin: Secondary | ICD-10-CM | POA: Diagnosis not present

## 2024-07-23 DIAGNOSIS — N186 End stage renal disease: Secondary | ICD-10-CM | POA: Diagnosis not present

## 2024-07-23 DIAGNOSIS — L299 Pruritus, unspecified: Secondary | ICD-10-CM | POA: Diagnosis not present

## 2024-07-23 DIAGNOSIS — Z992 Dependence on renal dialysis: Secondary | ICD-10-CM | POA: Diagnosis not present

## 2024-07-23 DIAGNOSIS — T8249XA Other complication of vascular dialysis catheter, initial encounter: Secondary | ICD-10-CM | POA: Diagnosis not present

## 2024-07-26 DIAGNOSIS — N186 End stage renal disease: Secondary | ICD-10-CM | POA: Diagnosis not present

## 2024-07-26 DIAGNOSIS — Z992 Dependence on renal dialysis: Secondary | ICD-10-CM | POA: Diagnosis not present

## 2024-07-26 DIAGNOSIS — L299 Pruritus, unspecified: Secondary | ICD-10-CM | POA: Diagnosis not present

## 2024-07-26 DIAGNOSIS — N2581 Secondary hyperparathyroidism of renal origin: Secondary | ICD-10-CM | POA: Diagnosis not present

## 2024-07-26 DIAGNOSIS — T8249XA Other complication of vascular dialysis catheter, initial encounter: Secondary | ICD-10-CM | POA: Diagnosis not present

## 2024-07-27 DIAGNOSIS — N186 End stage renal disease: Secondary | ICD-10-CM | POA: Diagnosis not present

## 2024-07-27 DIAGNOSIS — T8612 Kidney transplant failure: Secondary | ICD-10-CM | POA: Diagnosis not present

## 2024-07-27 DIAGNOSIS — Z992 Dependence on renal dialysis: Secondary | ICD-10-CM | POA: Diagnosis not present

## 2024-07-29 DIAGNOSIS — T8249XA Other complication of vascular dialysis catheter, initial encounter: Secondary | ICD-10-CM | POA: Diagnosis not present

## 2024-07-29 DIAGNOSIS — Z992 Dependence on renal dialysis: Secondary | ICD-10-CM | POA: Diagnosis not present

## 2024-07-29 DIAGNOSIS — N186 End stage renal disease: Secondary | ICD-10-CM | POA: Diagnosis not present

## 2024-07-29 DIAGNOSIS — N2581 Secondary hyperparathyroidism of renal origin: Secondary | ICD-10-CM | POA: Diagnosis not present

## 2024-07-31 DIAGNOSIS — N186 End stage renal disease: Secondary | ICD-10-CM | POA: Diagnosis not present

## 2024-07-31 DIAGNOSIS — N2581 Secondary hyperparathyroidism of renal origin: Secondary | ICD-10-CM | POA: Diagnosis not present

## 2024-07-31 DIAGNOSIS — Z992 Dependence on renal dialysis: Secondary | ICD-10-CM | POA: Diagnosis not present

## 2024-07-31 DIAGNOSIS — T8249XA Other complication of vascular dialysis catheter, initial encounter: Secondary | ICD-10-CM | POA: Diagnosis not present

## 2024-08-02 DIAGNOSIS — T8249XA Other complication of vascular dialysis catheter, initial encounter: Secondary | ICD-10-CM | POA: Diagnosis not present

## 2024-08-02 DIAGNOSIS — Z992 Dependence on renal dialysis: Secondary | ICD-10-CM | POA: Diagnosis not present

## 2024-08-02 DIAGNOSIS — N2581 Secondary hyperparathyroidism of renal origin: Secondary | ICD-10-CM | POA: Diagnosis not present

## 2024-08-02 DIAGNOSIS — N186 End stage renal disease: Secondary | ICD-10-CM | POA: Diagnosis not present

## 2024-08-05 DIAGNOSIS — N2581 Secondary hyperparathyroidism of renal origin: Secondary | ICD-10-CM | POA: Diagnosis not present

## 2024-08-05 DIAGNOSIS — Z992 Dependence on renal dialysis: Secondary | ICD-10-CM | POA: Diagnosis not present

## 2024-08-05 DIAGNOSIS — T8249XA Other complication of vascular dialysis catheter, initial encounter: Secondary | ICD-10-CM | POA: Diagnosis not present

## 2024-08-05 DIAGNOSIS — N186 End stage renal disease: Secondary | ICD-10-CM | POA: Diagnosis not present

## 2024-08-07 DIAGNOSIS — N2581 Secondary hyperparathyroidism of renal origin: Secondary | ICD-10-CM | POA: Diagnosis not present

## 2024-08-07 DIAGNOSIS — N186 End stage renal disease: Secondary | ICD-10-CM | POA: Diagnosis not present

## 2024-08-07 DIAGNOSIS — T8249XA Other complication of vascular dialysis catheter, initial encounter: Secondary | ICD-10-CM | POA: Diagnosis not present

## 2024-08-07 DIAGNOSIS — Z992 Dependence on renal dialysis: Secondary | ICD-10-CM | POA: Diagnosis not present

## 2024-08-09 DIAGNOSIS — N2581 Secondary hyperparathyroidism of renal origin: Secondary | ICD-10-CM | POA: Diagnosis not present

## 2024-08-09 DIAGNOSIS — T8249XA Other complication of vascular dialysis catheter, initial encounter: Secondary | ICD-10-CM | POA: Diagnosis not present

## 2024-08-09 DIAGNOSIS — N186 End stage renal disease: Secondary | ICD-10-CM | POA: Diagnosis not present

## 2024-08-09 DIAGNOSIS — Z992 Dependence on renal dialysis: Secondary | ICD-10-CM | POA: Diagnosis not present

## 2024-08-14 ENCOUNTER — Other Ambulatory Visit: Payer: Self-pay | Admitting: *Deleted

## 2024-08-14 MED ORDER — INSULIN GLARGINE-YFGN 100 UNIT/ML ~~LOC~~ SOPN: 10.0000 [IU] | PEN_INJECTOR | Freq: Every day | SUBCUTANEOUS | 1 refills | Status: AC

## 2024-08-15 ENCOUNTER — Other Ambulatory Visit (HOSPITAL_COMMUNITY): Payer: Self-pay | Admitting: Nephrology

## 2024-08-15 DIAGNOSIS — N186 End stage renal disease: Secondary | ICD-10-CM

## 2024-08-18 ENCOUNTER — Telehealth (HOSPITAL_COMMUNITY): Payer: Self-pay

## 2024-08-18 NOTE — Telephone Encounter (Signed)
LMOM for pt to callback for scheduling

## 2024-08-25 ENCOUNTER — Other Ambulatory Visit (HOSPITAL_COMMUNITY)

## 2024-09-01 ENCOUNTER — Ambulatory Visit (HOSPITAL_COMMUNITY)
Admission: RE | Admit: 2024-09-01 | Discharge: 2024-09-01 | Disposition: A | Discharge status: Home / Self Care | Source: Ambulatory Visit | Attending: Nephrology | Admitting: Nephrology

## 2024-09-01 DIAGNOSIS — N186 End stage renal disease: Secondary | ICD-10-CM | POA: Insufficient documentation

## 2024-09-01 DIAGNOSIS — Z4901 Encounter for fitting and adjustment of extracorporeal dialysis catheter: Secondary | ICD-10-CM | POA: Insufficient documentation

## 2024-09-01 HISTORY — PX: IR REMOVAL TUN CV CATH W/O FL: IMG2289

## 2024-09-01 MED ORDER — LIDOCAINE-EPINEPHRINE 1 %-1:100000 IJ SOLN
INTRAMUSCULAR | Status: AC
  Filled 2024-09-01: qty 20

## 2024-09-01 MED ORDER — LIDOCAINE-EPINEPHRINE 1 %-1:100000 IJ SOLN
20.0000 mL | Freq: Once | INTRAMUSCULAR | Status: AC
  Administered 2024-09-01: 10 mL via INTRADERMAL

## 2024-09-01 NOTE — Procedures (Signed)
 Interventional Radiology Procedure Note  Risks and benefits of removal of hemodialysis catheter were discussed with the patient including, but not limited to bleeding, hematoma, infection, damage to adjacent structures.  All of the patient's questions were answered, patient is agreeable to proceed. Consent signed and in chart. PROCEDURE SUMMARY:  Successful removal of tunneled dialysis catheter.  No complications.   EBL = trace  Please see full dictation in imaging section of Epic for procedure details.  Delon JAYSON Beagle NP 09/01/2024 1:58 PM

## 2024-09-02 NOTE — Progress Notes (Unsigned)
 " Cardiology Office Note:  .   Date:  09/03/2024  ID:  Adam Davis, DOB 1976/01/07, MRN [id number] PCP: Lonnie Earnest, MD  [facility] Providers Cardiologist:  None   History of Present Illness: .    Chief Complaint  Patient presents with   systolic heart failure    Adam Davis is a 49 y.o. male with below history who presents for the evaluation of CHF at the request of Baloch, Mahnoor, MD.   History of Present Illness   Adam Davis is a 49 year old male with end-stage renal disease, hypertension, and diabetes who presents with systolic heart failure.  In June, he was admitted to the hospital due to volume overload requiring dialysis, and his ejection fraction was noted to be 40% at that time. Currently, he has no significant chest pain or trouble breathing. An EKG demonstrates sinus rhythm and left ventricular hypertrophy.  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 The transplant has since failed, and he is currently on dialysis three times a week. He attends all dialysis sessions. He makes minimal urine.  His blood pressure is currently not well controlled, recorded at 154/92 mmHg. He is taking carvedilol  25 mg twice daily and amlodipine  10 mg daily. He is not currently taking valsartan . No history of heart attacks, strokes, or heart artery blockages.  He has diabetes with a recent A1c of 8.9% and is on insulin  therapy. For hyperlipidemia, he is possibly taking Crestor  10 mg every other day, but he is unclear about his current cholesterol medication regimen. His most recent LDL was 156 mg/dL.  He does not smoke, consume alcohol, or use drugs. He is married with two children and has grandchildren. He is currently receiving disability benefits and is not working. There is no family history of heart disease.           Problem List ESRD -s/p renal Tx - since  childhood GAS infection  HTN DM -A1c 8.9 HLD -T chol 226, HDL 49, LDL 156, TG 116    ROS: All other ROS reviewed and negative. Pertinent positives noted in the HPI.     Studies Reviewed: SABRA   EKG Interpretation Date/Time:  Wednesday September 03 2024 09:04:25 EST Ventricular Rate:  84 PR Interval:  168 QRS Duration:  90 QT Interval:  452 QTC Calculation: 534 R Axis:   6  Text Interpretation: Normal sinus rhythm Possible Left atrial enlargement Left ventricular hypertrophy with repolarization abnormality ( Sokolow-Lyon , Cornell product , Romhilt-Estes ) Prolonged QT Confirmed by Barbaraann Kotyk [id number]) on 09/03/2024 9:11:50 AM   TTE 01/29/2024  1. No left ventricular thrombus is seen (Definity  contrast was used).  Left ventricular ejection fraction, by estimation, is 40%. The left  ventricle has mild to moderately decreased function. The left ventricle  demonstrates global hypokinesis. There is  moderate concentric left ventricular hypertrophy. Left ventricular  diastolic parameters are consistent with Grade II diastolic dysfunction  (pseudonormalization). Elevated left atrial pressure.   2. Right ventricular systolic function is normal. The right ventricular  size is normal. There is moderately elevated pulmonary artery systolic  pressure. The estimated right ventricular systolic pressure is 59.9 mmHg.   3. Left atrial size was moderately dilated.   4. Right atrial size was moderately dilated.   5. A small pericardial effusion is present. The pericardial effusion is  circumferential. There is no  evidence of cardiac tamponade.   6. The mitral valve is normal in structure. Moderate mitral valve  regurgitation. No evidence of mitral stenosis.   7. The aortic valve is tricuspid. There is mild calcification of the  aortic valve. Aortic valve regurgitation is trivial. Aortic valve  sclerosis/calcification is present, without any evidence of aortic  stenosis.   8. There is borderline  dilatation of the aortic root and of the ascending  aorta, measuring 38 mm.   9. The inferior vena cava is dilated in size with <50% respiratory  variability, suggesting right atrial pressure of 15 mmHg.  Physical Exam:   VS:  BP (!) 154/92   Pulse 84   Ht 5' 6 (1.676 m)   Wt 136 lb 12.8 oz (62.1 kg)   SpO2 97%   BMI 22.08 kg/m    Wt Readings from Last 3 Encounters:  09/03/24 136 lb 12.8 oz (62.1 kg)  07/21/24 138 lb (62.6 kg)  04/16/24 130 lb (59 kg)    GEN: Well nourished, well developed in no acute distress NECK: No JVD; No carotid bruits CARDIAC: RRR, no murmurs, rubs, gallops RESPIRATORY:  Clear to auscultation without rales, wheezing or rhonchi  ABDOMEN: Soft, non-tender, non-distended EXTREMITIES:  No edema; No deformity  ASSESSMENT AND PLAN: .   Assessment and Plan    Chronic systolic heart failure (EF 40%) Chronic systolic heart failure with EF 40% likely due to uncontrolled hypertension. Previous hospitalization for volume overload. No current volume overload or significant chest pain. Uncontrolled blood pressure exacerbates heart failure. - Ordered echocardiogram to reassess heart function. - Started hydralazine  50 mg TID to improve blood pressure control. - Continue carvedilol  25 mg BID and Norvasc  10 mg daily. - Instructed to monitor blood pressure daily and maintain a log.  Renovascular hypertension Hypertension poorly controlled, contributing to heart failure and renal disease progression. Current blood pressure 154/92 mmHg. - Started hydralazine  50 mg TID to improve blood pressure control. - Continue carvedilol  25 mg BID and Norvasc  10 mg daily. - Instructed to monitor blood pressure daily and maintain a log.  End stage renal disease on hemodialysis, status post failed renal transplant Long-standing end stage renal disease due to childhood strep infection. Post failed renal transplant, now on dialysis three times a week with no volume overload. - Continue  dialysis three times a week. - Ensure adherence to dialysis schedule.   HLD - Unclear meds. We will re-evaluate in 3 months. 1                Follow-up: Return in about 3 months (around 12/02/2024).  Signed, Darryle DASEN. Barbaraann, MD, [facility]  [facility]  [address] [place], [place] [address] [phone]  10:44 AM   "

## 2024-09-03 ENCOUNTER — Ambulatory Visit: Attending: Cardiovascular Disease | Admitting: Cardiovascular Disease

## 2024-09-03 ENCOUNTER — Encounter: Payer: Self-pay | Admitting: Cardiovascular Disease

## 2024-09-03 VITALS — BP 154/92 | HR 84 | Ht 66.0 in | Wt 136.8 lb

## 2024-09-03 DIAGNOSIS — I150 Renovascular hypertension: Secondary | ICD-10-CM

## 2024-09-03 DIAGNOSIS — I5022 Chronic systolic (congestive) heart failure: Secondary | ICD-10-CM

## 2024-09-03 DIAGNOSIS — N186 End stage renal disease: Secondary | ICD-10-CM | POA: Diagnosis not present

## 2024-09-03 MED ORDER — HYDRALAZINE HCL 50 MG PO TABS: 50.0000 mg | ORAL_TABLET | Freq: Three times a day (TID) | ORAL | 3 refills | Status: AC

## 2024-09-03 NOTE — Patient Instructions (Signed)
 Medication Instructions:  START: Hydralazine  50 mg 3 times daily  *If you need a refill on your cardiac medications before your next appointment, please call your pharmacy*  Lab Work: NONE If you have labs (blood work) drawn today and your tests are completely normal, you will receive your results only by: MyChart Message (if you have MyChart) OR A paper copy in the mail If you have any lab test that is abnormal or we need to change your treatment, we will call you to review the results.  Testing/Procedures: Your physician has requested that you have an echocardiogram. Echocardiography is a painless test that uses sound waves to create images of your heart. It provides your doctor with information about the size and shape of your heart and how well your hearts chambers and valves are working. This procedure takes approximately one hour. There are no restrictions for this procedure. Please do NOT wear cologne, perfume, aftershave, or lotions (deodorant is allowed). Please arrive 15 minutes prior to your appointment time.  Please note: We ask at that you not bring children with you during ultrasound (echo/ vascular) testing. Due to room size and safety concerns, children are not allowed in the ultrasound rooms during exams. Our front office staff cannot provide observation of children in our lobby area while testing is being conducted. An adult accompanying a patient to their appointment will only be allowed in the ultrasound room at the discretion of the ultrasound technician under special circumstances. We apologize for any inconvenience.   Follow-Up: At [facility], you and your health needs are our priority.  As part of our continuing mission to provide you with exceptional heart care, our providers are all part of one team.  This team includes your primary Cardiologist (physician) and Advanced Practice Providers or APPs (Physician Assistants and Nurse Practitioners) who all work  together to provide you with the care you need, when you need it.  Your next appointment:   3 months with APP (Bring all medication to this appointment)  1 yr with Dr. Barbaraann  We recommend signing up for the patient portal called MyChart.  Sign up information is provided on this After Visit Summary.  MyChart is used to connect with patients for Virtual Visits (Telemedicine).  Patients are able to view lab/test results, encounter notes, upcoming appointments, etc.  Non-urgent messages can be sent to your provider as well.   To learn more about what you can do with MyChart, go to [email or web address].   Other Instructions Check blood pressure daily

## 2024-09-15 ENCOUNTER — Ambulatory Visit

## 2024-09-29 ENCOUNTER — Ambulatory Visit (HOSPITAL_COMMUNITY)
Admission: RE | Admit: 2024-09-29 | Discharge: 2024-09-29 | Disposition: A | Attending: Vascular Surgery | Admitting: Vascular Surgery

## 2024-09-29 ENCOUNTER — Encounter (HOSPITAL_COMMUNITY): Admission: RE | Disposition: A | Payer: Self-pay | Attending: Vascular Surgery

## 2024-09-29 ENCOUNTER — Other Ambulatory Visit: Payer: Self-pay

## 2024-09-29 DIAGNOSIS — T82858A Stenosis of vascular prosthetic devices, implants and grafts, initial encounter: Secondary | ICD-10-CM | POA: Diagnosis not present

## 2024-09-29 DIAGNOSIS — I509 Heart failure, unspecified: Secondary | ICD-10-CM | POA: Insufficient documentation

## 2024-09-29 DIAGNOSIS — T82868A Thrombosis of vascular prosthetic devices, implants and grafts, initial encounter: Secondary | ICD-10-CM | POA: Insufficient documentation

## 2024-09-29 DIAGNOSIS — Z940 Kidney transplant status: Secondary | ICD-10-CM | POA: Insufficient documentation

## 2024-09-29 DIAGNOSIS — E1122 Type 2 diabetes mellitus with diabetic chronic kidney disease: Secondary | ICD-10-CM | POA: Insufficient documentation

## 2024-09-29 DIAGNOSIS — Z992 Dependence on renal dialysis: Secondary | ICD-10-CM | POA: Diagnosis not present

## 2024-09-29 DIAGNOSIS — N186 End stage renal disease: Secondary | ICD-10-CM | POA: Insufficient documentation

## 2024-09-29 DIAGNOSIS — Y832 Surgical operation with anastomosis, bypass or graft as the cause of abnormal reaction of the patient, or of later complication, without mention of misadventure at the time of the procedure: Secondary | ICD-10-CM | POA: Insufficient documentation

## 2024-09-29 DIAGNOSIS — Z79899 Other long term (current) drug therapy: Secondary | ICD-10-CM | POA: Insufficient documentation

## 2024-09-29 DIAGNOSIS — I132 Hypertensive heart and chronic kidney disease with heart failure and with stage 5 chronic kidney disease, or end stage renal disease: Secondary | ICD-10-CM | POA: Insufficient documentation

## 2024-09-29 LAB — GLUCOSE, CAPILLARY: Glucose-Capillary: 93 mg/dL (ref 70–99)

## 2024-09-29 MED ORDER — LIDOCAINE HCL (PF) 1 % IJ SOLN
INTRAMUSCULAR | Status: DC | PRN
  Administered 2024-09-29 (×3): 5 mL

## 2024-09-29 MED ORDER — HEPARIN SODIUM (PORCINE) 1000 UNIT/ML IJ SOLN
INTRAMUSCULAR | Status: AC
  Filled 2024-09-29: qty 10

## 2024-09-29 MED ORDER — HEPARIN SODIUM (PORCINE) 1000 UNIT/ML IJ SOLN
INTRAMUSCULAR | Status: DC | PRN
  Administered 2024-09-29: 7000 [IU] via INTRAVENOUS
  Administered 2024-09-29: 3000 [IU] via INTRAVENOUS
  Administered 2024-09-29: 5000 [IU] via INTRAVENOUS

## 2024-09-29 MED ORDER — LIDOCAINE HCL (PF) 1 % IJ SOLN
INTRAMUSCULAR | Status: AC
  Filled 2024-09-29: qty 30

## 2024-09-29 MED ORDER — HEPARIN (PORCINE) IN NACL 1000-0.9 UT/500ML-% IV SOLN
INTRAVENOUS | Status: DC | PRN
  Administered 2024-09-29 (×2): 500 mL

## 2024-09-29 MED ORDER — IODIXANOL 320 MG/ML IV SOLN
INTRAVENOUS | Status: DC | PRN
  Administered 2024-09-29: 80 mL via INTRAVENOUS

## 2024-09-30 ENCOUNTER — Encounter (HOSPITAL_COMMUNITY): Payer: Self-pay | Admitting: Vascular Surgery

## 2024-10-01 ENCOUNTER — Ambulatory Visit: Admitting: Podiatry

## 2024-10-06 ENCOUNTER — Encounter (HOSPITAL_COMMUNITY): Payer: Self-pay

## 2024-10-06 ENCOUNTER — Ambulatory Visit (HOSPITAL_COMMUNITY): Admit: 2024-10-06 | Admitting: Vascular Surgery

## 2024-10-06 ENCOUNTER — Ambulatory Visit (HOSPITAL_COMMUNITY)

## 2024-10-27 ENCOUNTER — Ambulatory Visit

## 2024-12-01 ENCOUNTER — Ambulatory Visit: Admitting: Emergency Medicine
